# Patient Record
Sex: Male | Born: 1935 | Race: White | Hispanic: No | Marital: Married | Smoking: Former smoker
Health system: Southern US, Community
[De-identification: ages and names within clinical notes are randomized; demographics above are authoritative.]

## PROBLEM LIST (undated history)

## (undated) DIAGNOSIS — I428 Other cardiomyopathies: Secondary | ICD-10-CM

## (undated) DIAGNOSIS — I440 Atrioventricular block, first degree: Secondary | ICD-10-CM

## (undated) DIAGNOSIS — Z973 Presence of spectacles and contact lenses: Secondary | ICD-10-CM

## (undated) DIAGNOSIS — Z974 Presence of external hearing-aid: Secondary | ICD-10-CM

## (undated) DIAGNOSIS — E039 Hypothyroidism, unspecified: Secondary | ICD-10-CM

## (undated) DIAGNOSIS — R7303 Prediabetes: Secondary | ICD-10-CM

## (undated) DIAGNOSIS — I447 Left bundle-branch block, unspecified: Secondary | ICD-10-CM

## (undated) DIAGNOSIS — M1990 Unspecified osteoarthritis, unspecified site: Secondary | ICD-10-CM

## (undated) DIAGNOSIS — K644 Residual hemorrhoidal skin tags: Secondary | ICD-10-CM

## (undated) DIAGNOSIS — I2510 Atherosclerotic heart disease of native coronary artery without angina pectoris: Secondary | ICD-10-CM

## (undated) DIAGNOSIS — G621 Alcoholic polyneuropathy: Secondary | ICD-10-CM

## (undated) DIAGNOSIS — E538 Deficiency of other specified B group vitamins: Secondary | ICD-10-CM

## (undated) DIAGNOSIS — K449 Diaphragmatic hernia without obstruction or gangrene: Secondary | ICD-10-CM

## (undated) DIAGNOSIS — K5730 Diverticulosis of large intestine without perforation or abscess without bleeding: Secondary | ICD-10-CM

## (undated) DIAGNOSIS — Z86010 Personal history of colonic polyps: Secondary | ICD-10-CM

## (undated) DIAGNOSIS — N400 Enlarged prostate without lower urinary tract symptoms: Secondary | ICD-10-CM

## (undated) DIAGNOSIS — E291 Testicular hypofunction: Secondary | ICD-10-CM

## (undated) DIAGNOSIS — K219 Gastro-esophageal reflux disease without esophagitis: Secondary | ICD-10-CM

## (undated) DIAGNOSIS — Z8719 Personal history of other diseases of the digestive system: Secondary | ICD-10-CM

## (undated) DIAGNOSIS — N3010 Interstitial cystitis (chronic) without hematuria: Secondary | ICD-10-CM

## (undated) DIAGNOSIS — K5909 Other constipation: Secondary | ICD-10-CM

## (undated) DIAGNOSIS — Z789 Other specified health status: Secondary | ICD-10-CM

## (undated) DIAGNOSIS — Z860101 Personal history of adenomatous and serrated colon polyps: Secondary | ICD-10-CM

## (undated) DIAGNOSIS — K589 Irritable bowel syndrome without diarrhea: Secondary | ICD-10-CM

## (undated) HISTORY — DX: Testicular hypofunction: E29.1

## (undated) HISTORY — PX: TRANSTHORACIC ECHOCARDIOGRAM: SHX275

## (undated) HISTORY — PX: CATARACT EXTRACTION W/ INTRAOCULAR LENS  IMPLANT, BILATERAL: SHX1307

## (undated) HISTORY — DX: Other cardiomyopathies: I42.8

## (undated) HISTORY — DX: Residual hemorrhoidal skin tags: K64.4

## (undated) HISTORY — DX: Irritable bowel syndrome, unspecified: K58.9

## (undated) HISTORY — PX: CARDIOVASCULAR STRESS TEST: SHX262

## (undated) HISTORY — PX: TOTAL KNEE ARTHROPLASTY: SHX125

## (undated) HISTORY — PX: CARDIAC CATHETERIZATION: SHX172

## (undated) HISTORY — PX: TONSILLECTOMY AND ADENOIDECTOMY: SUR1326

## (undated) HISTORY — DX: Gastro-esophageal reflux disease without esophagitis: K21.9

## (undated) HISTORY — DX: Atherosclerotic heart disease of native coronary artery without angina pectoris: I25.10

## (undated) HISTORY — PX: COLONOSCOPY: SHX174

## (undated) HISTORY — DX: Diaphragmatic hernia without obstruction or gangrene: K44.9

## (undated) HISTORY — DX: Interstitial cystitis (chronic) without hematuria: N30.10

## (undated) HISTORY — PX: UPPER GASTROINTESTINAL ENDOSCOPY: SHX188

## (undated) HISTORY — PX: OTHER SURGICAL HISTORY: SHX169

## (undated) HISTORY — PX: TRANSURETHRAL RESECTION OF PROSTATE: SHX73

## (undated) HISTORY — DX: Left bundle-branch block, unspecified: I44.7

---
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
## 1998-06-29 ENCOUNTER — Other Ambulatory Visit: Admission: RE | Admit: 1998-06-29 | Discharge: 1998-06-29 | Payer: Self-pay | Admitting: Gastroenterology

## 2005-04-20 ENCOUNTER — Ambulatory Visit (HOSPITAL_BASED_OUTPATIENT_CLINIC_OR_DEPARTMENT_OTHER): Admission: RE | Admit: 2005-04-20 | Discharge: 2005-04-20 | Payer: Self-pay | Admitting: Urology

## 2005-04-20 ENCOUNTER — Encounter (INDEPENDENT_AMBULATORY_CARE_PROVIDER_SITE_OTHER): Payer: Self-pay | Admitting: Specialist

## 2005-04-20 ENCOUNTER — Ambulatory Visit (HOSPITAL_COMMUNITY): Admission: RE | Admit: 2005-04-20 | Discharge: 2005-04-20 | Payer: Self-pay | Admitting: Urology

## 2005-09-12 ENCOUNTER — Ambulatory Visit: Payer: Self-pay | Admitting: Gastroenterology

## 2005-09-19 ENCOUNTER — Ambulatory Visit: Payer: Self-pay | Admitting: Gastroenterology

## 2006-10-09 ENCOUNTER — Encounter: Admission: RE | Admit: 2006-10-09 | Discharge: 2006-10-09 | Payer: Self-pay | Admitting: Internal Medicine

## 2006-10-12 ENCOUNTER — Ambulatory Visit: Payer: Self-pay | Admitting: Gastroenterology

## 2006-11-03 ENCOUNTER — Ambulatory Visit: Payer: Self-pay | Admitting: Gastroenterology

## 2006-11-03 ENCOUNTER — Encounter (INDEPENDENT_AMBULATORY_CARE_PROVIDER_SITE_OTHER): Payer: Self-pay | Admitting: *Deleted

## 2008-04-01 ENCOUNTER — Encounter: Admission: RE | Admit: 2008-04-01 | Discharge: 2008-04-01 | Payer: Self-pay | Admitting: Diagnostic Radiology

## 2008-09-02 ENCOUNTER — Encounter: Payer: Self-pay | Admitting: Gastroenterology

## 2010-06-03 ENCOUNTER — Encounter: Admission: RE | Admit: 2010-06-03 | Discharge: 2010-06-03 | Payer: Self-pay | Admitting: Urology

## 2010-06-11 ENCOUNTER — Ambulatory Visit (HOSPITAL_BASED_OUTPATIENT_CLINIC_OR_DEPARTMENT_OTHER): Admission: RE | Admit: 2010-06-11 | Discharge: 2010-06-12 | Payer: Self-pay | Admitting: Urology

## 2010-06-12 ENCOUNTER — Emergency Department (HOSPITAL_COMMUNITY): Admission: EM | Admit: 2010-06-12 | Discharge: 2010-06-12 | Payer: Self-pay | Admitting: Emergency Medicine

## 2010-12-20 ENCOUNTER — Encounter: Payer: Self-pay | Admitting: Gastroenterology

## 2011-01-06 NOTE — Letter (Signed)
Summary: Colonoscopy-Changed to Office Visit Letter   Gastroenterology  [address]   [place], [place] [address]   Phone: [phone]  Fax: [phone]      December 20, 2010 MRN: [id number]   David Gutierrez [address] [place], [place]  [address]   Dear [name],   According to our records, it is time for you to schedule a Colonoscopy. However, after reviewing your medical record, I feel that an office visit would be most appropriate to more completely evaluate you and determine your need for a repeat procedure.  Please call [phone] (option #2) at your convenience to schedule an office visit. If you have any questions, concerns, or feel that this letter is in error, we would appreciate your call.   Sincerely,   Vania Rea. Jarold Motto, M.D.  [facility] Gastroenterology Division [phone]

## 2011-02-20 LAB — CBC
MCH: 32.8 pg (ref 26.0–34.0)
MCV: 94.9 fL (ref 78.0–100.0)
RDW: 17.1 % — ABNORMAL HIGH (ref 11.5–15.5)

## 2011-02-20 LAB — BASIC METABOLIC PANEL
BUN: 8 mg/dL (ref 6–23)
CO2: 25 mEq/L (ref 19–32)
Chloride: 102 mEq/L (ref 96–112)
Creatinine, Ser: 0.65 mg/dL (ref 0.4–1.5)
GFR calc Af Amer: >60 mL/min (ref >60)
Glucose, Bld: 115 mg/dL — ABNORMAL HIGH (ref 70–99)

## 2011-02-20 LAB — DIFFERENTIAL
Basophils Absolute: 0.4 10*3/uL — ABNORMAL HIGH (ref 0.0–0.1)
Eosinophils Absolute: 0.1 10*3/uL (ref 0.0–0.7)
Eosinophils Relative: 1 % (ref 0–5)
Lymphocytes Relative: 13 % (ref 12–46)
Neutrophils Relative %: 76 % (ref 43–77)

## 2011-02-20 LAB — POCT I-STAT 4, (NA,K, GLUC, HGB,HCT)
Hemoglobin: 13.3 g/dL (ref 13.0–17.0)
Potassium: 5.4 mEq/L — ABNORMAL HIGH (ref 3.5–5.1)
Sodium: 139 mEq/L (ref 135–145)

## 2011-02-23 ENCOUNTER — Encounter (INDEPENDENT_AMBULATORY_CARE_PROVIDER_SITE_OTHER): Payer: Self-pay | Admitting: *Deleted

## 2011-03-03 NOTE — Letter (Signed)
Summary: New Patient letter  [facility]enter[facility]  [address]   [place], [place] [address]   Phone: [phone]  Fax: [phone]       02/23/2011 MRN: [id number]  David Gutierrez [address] [place], [place]  [address]  Dear [name],  Welcome to the Gastroenterology Division at [facility].    You are scheduled to see Dr.  Jarold Motto on 04/01/2011 at 10:00 on the 3rd floor at [facility], [address]  We ask that you try to arrive at our office 15 minutes prior to your appointment time to allow for check-in.  We would like you to complete the enclosed self-administered evaluation form prior to your visit and bring it with you on the day of your appointment.  We will review it with you.  Also, please bring a complete list of all your medications or, if you prefer, bring the medication bottles and we will list them.  Please bring your insurance card so that we may make a copy of it.  If your insurance requires a referral to see a specialist, please bring your referral form from your primary care physician.  Co-payments are due at the time of your visit and may be paid by cash, check or credit card.     Your office visit will consist of a consult with your physician (includes a physical exam), any laboratory testing he/she may order, scheduling of any necessary diagnostic testing (e.g. x-ray, ultrasound, CT-scan), and scheduling of a procedure (e.g. Endoscopy, Colonoscopy) if required.  Please allow enough time on your schedule to allow for any/all of these possibilities.    If you cannot keep your appointment, please call [phone] to cancel or reschedule prior to your appointment date.  This allows [place] the opportunity to schedule an appointment for another patient in need of care.  If you do not cancel or reschedule by 5 p.m. the business day prior to your appointment date, you will be charged a $50.00 late cancellation/no-show fee.    Thank you for choosing  [facility] for your medical needs.  We appreciate the opportunity to care for you.  Please visit [place] at our website  to learn more about our practice.                     Sincerely,                                                             The Gastroenterology Division

## 2011-04-01 ENCOUNTER — Ambulatory Visit: Payer: Self-pay | Admitting: Gastroenterology

## 2011-04-19 ENCOUNTER — Encounter: Payer: Self-pay | Admitting: Gastroenterology

## 2011-04-19 ENCOUNTER — Ambulatory Visit (INDEPENDENT_AMBULATORY_CARE_PROVIDER_SITE_OTHER): Payer: Medicare Other | Admitting: Gastroenterology

## 2011-04-19 VITALS — BP 130/80 | HR 72 | Ht 72.0 in | Wt 219.2 lb

## 2011-04-19 DIAGNOSIS — Z86010 Personal history of colonic polyps: Secondary | ICD-10-CM

## 2011-04-19 MED ORDER — PEG-KCL-NACL-NASULF-NA ASC-C 100 G PO SOLR: 1.0000 | Freq: Once | ORAL | Status: AC

## 2011-04-19 NOTE — Patient Instructions (Signed)
Your procedure has been scheduled for 04/25/2011, please follow the seperate instructions.  Your prescription(s) have been sent to you pharmacy.

## 2011-04-19 NOTE — Progress Notes (Signed)
History of Present Illness:  This is a 77 year old Caucasian male in excellent health except for interstitial cystitis. He has a long history of recurrent colon polyps going back some 20 years. Family history is noncontributory. He has no cardiovascular, pulmonary, or other general medical problems. He denies bowel regularity, melena or hematochezia, dyspepsia, reflux symptoms, hernia tubular complaints. Had multiple labs today but were reviewed which showed normal CBC a metabolic profile. He does take a daily stool softener and a colon cleanser 3 tablets twice a day.   I have reviewed this patient's present history, medical and surgical past history, allergies and medications.     ROS: The remainder of the 10 point ROS is negative     Physical Exam: General well developed well nourished patient in no acute distress, appearing younger than his stated age. Cannot appreciate stigmata of chronic liver disease. Chest is clear cardiac exam is unremarkable. There is no hepatosplenomegaly, abdominal masses or tenderness. Neuro status is normal.     Assessment and plan: I reviewed this patient's records colonoscopy reports over the last 20 years. Has had recurrent numerous adenomatous colon polyps, and I do think repeat exam at this time is indicated. If his colonoscopy is negative, we will suspend colonoscopy checks pending on his future comorbid medical problems.  Please copy her primary care physician, referring physician, and pertinent subspecialists.  Encounter Diagnosis  Name Primary?  . Personal history of colonic polyps Yes

## 2011-04-22 NOTE — Assessment & Plan Note (Signed)
David Gutierrez                           GASTROENTEROLOGY OFFICE NOTE   NAME:David Gutierrez, David Gutierrez                        MRN:          [id number]  DATE:10/12/2006                            DOB:          1933/05/08    David Gutierrez has had years of acid reflux but has used vinegar as a treatment.  He  now describes 6 to 8 months of progressive solid food dysphagia.  He had an  upper GI series on October 09, 2006, which is interpreted by Dr. 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  He was able to swallow a 13 mm barium tablet without  difficulty.   The patient has not been on any treatment for his acid reflux.  He does have  a long history of recurrent colon polyps and has had frequent checks within  the last 2 years.  He denies lower GI problems at this time.  He has had no  anorexia or weight loss.  His only medical problem otherwise is interstitial  cystitis treated by Dr. Logan Bores.  He currently is on multiple medications for  such including Elmiron 100 mg twice a day, Elavil 25 mg at bedtimes, Atarax  25 mg at bedtime, Sanctura 20 mg twice a day, and Zoloft 50 mg a day with  p.r.n. Advil use.   REVIEW OF SYSTEMS:  Negative for any cardiovascular or pulmonary complaints,  or any symptoms of collagen vascular disease, or Raynaud phenomenon.  He is  followed closely by Dr. Jarome Matin.   EXAM:  He is 6 feet 1 inches tall and weighed 225 pounds.  Blood pressure  120/78, pulse was 74 and regular.  General physical exam is not performed at this time.   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  There is certainly no reason to suspect esophageal carcinoma  although this is a possibility.   RECOMMENDATIONS:  1. Outpatient endoscopy with biopsy and possible dilatation.  2.  Standard antireflux maneuvers, along with Nexium 40 mg q. a.m. to twice      a day as needed.  3. Other medications per Drs. Jarome Matin and Logan Bores.     Vania Rea. Jarold Motto, MD, Caleen Essex, FAGA  Electronically Signed    DRP/MedQ  DD: 10/12/2006  DT: 10/12/2006  Job #: [id number]   cc:   Barry Dienes. Eloise Harman, M.D.  Jamison Neighbor, M.D.

## 2011-04-22 NOTE — Op Note (Signed)
NAME:  RIYAD, KEENA                 ACCOUNT NO.:  [id number]   MEDICAL RECORD NO.:  [id number]          PATIENT TYPE:  AMB   LOCATION:  [facility]                         FACILITY:  [facility]   PHYSICIAN:  Ronald L. Earlene Plater, M.D.  DATE OF BIRTH:  11-18-1933   DATE OF PROCEDURE:  04/20/2005  DATE OF DISCHARGE:                                 OPERATIVE REPORT   DIAGNOSIS:  Probable interstitial cystitis.   OPERATIVE PROCEDURE:  Cystourethroscopy, hydraulic bladder distention,  bladder biopsy.   SURGEON:  Lucrezia Starch. Earlene Plater, M.D.   ANESTHESIA:  LMA.   ESTIMATED BLOOD LOSS:  Negligible.   TUBES:  None.   COMPLICATIONS:  None.   INDICATIONS FOR PROCEDURE:  Mr. Romberger is a very nice, 77 year old white  male, who Dr. Vonita Moss has followed for a number of years, who has a complex  history.  Essentially, he has years of urgency, frequency, dysuria,  hesitancy, originally diagnosed with interstitial cystitis by Dr. _________  in 1984.  He has undergone multiple therapies including Elmiron, DDAVP,  DMSO, Elavil, Oxytrol, and Prelief, had urodynamics in [place] in 2005, and  had a severe urinary tract infection after that.  He is currently on Proscar  and UroXatral and urinates all night and has continued symptoms.  After  understanding the risks, benefits, and alternatives, he elected to proceed  with the above procedure.   PROCEDURE IN DETAIL:  The patient was placed in the supine position, after  proper LMA anesthesia was placed in the dorsal lithotomy position and  prepped and draped with Betadine in a sterile fashion.  Cystourethroscopy  was performed with a 22.5-French Olympus panendoscope.  Utilizing the 12-  and 70-degree lenses, the bladder was carefully inspected.  He was noted to  have significant trilobar hypertrophy with grade 1 trabeculation.  Efflux of  clear urine was noted from the normally-placed ureteral orifices  bilaterally.  There was a median bar noted.  Hydraulic bladder  distention  was then performed to 80 cm of water pressure.  He was noted to have a  capacity of 1000 cc, and on draining it, he did have some posterior wall  glomerulations but no frank Hunner's ulcers.  A biopsy was taken from the  posterior midline and submitted to pathology, and the base was cauterized  with the Bugbee coagulation cautery. The bladder was drained.  The  panendoscope was removed.  The patient was taken to the recovery room  stable.    RLD/MEDQ  D:  04/20/2005  T:  04/20/2005  Job:  [id number]

## 2011-04-25 ENCOUNTER — Encounter: Payer: Self-pay | Admitting: Gastroenterology

## 2011-04-25 ENCOUNTER — Other Ambulatory Visit: Payer: Medicare Other | Admitting: Gastroenterology

## 2011-05-11 ENCOUNTER — Encounter: Payer: Self-pay | Admitting: Gastroenterology

## 2011-05-11 ENCOUNTER — Ambulatory Visit (AMBULATORY_SURGERY_CENTER): Payer: Medicare Other | Admitting: Gastroenterology

## 2011-05-11 VITALS — BP 130/75 | HR 66 | Temp 96.2°F | Resp 18 | Ht 72.0 in | Wt 219.0 lb

## 2011-05-11 DIAGNOSIS — K639 Disease of intestine, unspecified: Secondary | ICD-10-CM

## 2011-05-11 DIAGNOSIS — D126 Benign neoplasm of colon, unspecified: Secondary | ICD-10-CM

## 2011-05-11 DIAGNOSIS — Z86010 Personal history of colonic polyps: Secondary | ICD-10-CM

## 2011-05-11 DIAGNOSIS — K6389 Other specified diseases of intestine: Secondary | ICD-10-CM

## 2011-05-11 MED ORDER — SODIUM CHLORIDE 0.9 % IV SOLN: 500.0000 mL | INTRAVENOUS | Status: DC

## 2011-05-11 NOTE — Patient Instructions (Signed)
Discharge instructions given with verbal understanding. Handouts on hemorrhoids given. Resume previous medications.

## 2011-05-12 ENCOUNTER — Telehealth: Payer: Self-pay

## 2011-05-12 NOTE — Telephone Encounter (Signed)

## 2011-05-23 ENCOUNTER — Encounter: Payer: Self-pay | Admitting: Gastroenterology

## 2011-10-13 ENCOUNTER — Other Ambulatory Visit: Payer: Self-pay | Admitting: Dermatology

## 2012-07-09 ENCOUNTER — Other Ambulatory Visit: Payer: Self-pay | Admitting: Dermatology

## 2012-10-05 ENCOUNTER — Encounter: Payer: Self-pay | Admitting: *Deleted

## 2012-10-08 ENCOUNTER — Encounter: Payer: Self-pay | Admitting: Cardiology

## 2012-10-08 ENCOUNTER — Ambulatory Visit (INDEPENDENT_AMBULATORY_CARE_PROVIDER_SITE_OTHER): Payer: Medicare Other | Admitting: Cardiology

## 2012-10-08 VITALS — BP 122/74 | HR 72 | Ht 72.0 in | Wt 215.0 lb

## 2012-10-08 DIAGNOSIS — R079 Chest pain, unspecified: Secondary | ICD-10-CM

## 2012-10-08 DIAGNOSIS — I447 Left bundle-branch block, unspecified: Secondary | ICD-10-CM

## 2012-10-08 NOTE — Patient Instructions (Addendum)
Take aspirin 81mg  daily.   Your physician has requested that you have an adenosine myoview. For further information please visit [email or web address] Please follow instruction sheet, as given.  Your physician recommends that you schedule a follow-up appointment as needed with Dr Shirlee Latch.

## 2012-10-09 DIAGNOSIS — R079 Chest pain, unspecified: Secondary | ICD-10-CM | POA: Insufficient documentation

## 2012-10-09 DIAGNOSIS — I447 Left bundle-branch block, unspecified: Secondary | ICD-10-CM | POA: Insufficient documentation

## 2012-10-09 NOTE — Progress Notes (Signed)
Patient ID: David Gutierrez, male   DOB: 07/26/1933, 78 y.o.   MRN: [id number] PCP: Dr. Paterson  78 yo with minimal past medical history presents for cardiology evaluation.  He presents today because of an episode of left-sided chest pain lasting on and off for about a week that he had recently.  It felt like muscle soreness but he could remember no activity that would have caused a muscle strain.  The pain waxed/waned over the week.  Pain seemed to be worse when he would more his right arm.  The chest pain has been gone completely now for about a week.   Mr Nazaryan has good exercise tolerance in general.  He is able to do heavy yardwork, walk dog, play golf with no problems.  No exertional chest pain or dyspnea.  His ECG today shows a LBBB.  I am unsure if this is old or new.  He has been told that he has an "abnormality" on his ECG in the past.  No ECGs in EPIC other than today's.    ECG: NSR, LBBB  PMH: 1. Low testosterone 2. Interstitial cystitis 3. LBBB  SH: Lives in [place], retired, quit smoking 20+ years ago.   FH: Brother with CHF, died at 68  ROS: All systems reviewed and negative except as per HPI.   Current Outpatient Prescriptions  Medication Sig Dispense Refill  . bethanechol (URECHOLINE) 25 MG tablet Take 1 tablet by mouth as directed.      . aspirin EC 81 MG tablet Take 1 tablet (81 mg total) by mouth daily.      . docusate sodium (COLACE) 100 MG capsule Take 100 mg by mouth at bedtime.        . Misc Natural Products (COLON CLEANSER PO) Take 3 capsules by mouth 2 (two) times daily.        . Testosterone (AXIRON) 30 MG/ACT SOLN Place onto the skin. Once daily        Current Facility-Administered Medications  Medication Dose Route Frequency Provider Last Rate Last Dose  . 0.9 %  sodium chloride infusion  500 mL Intravenous Continuous David R Patterson, MD        BP 122/74  Pulse 72  Ht 6' (1.829 m)  Wt 215 lb (97.523 kg)  BMI 29.16 kg/m2 General: NAD Neck: No JVD,  no thyromegaly or thyroid nodule.  Lungs: Clear to auscultation bilaterally with normal respiratory effort. CV: Nondisplaced PMI.  Heart regular S1/S2, paradoxical S2 split, no S3/S4, no murmur.  No peripheral edema.  No carotid bruit.  Normal pedal pulses.  Abdomen: Soft, nontender, no hepatosplenomegaly, no distention.  Skin: Intact without lesions or rashes.  Neurologic: Alert and oriented x 3.  Psych: Normal affect. Extremities: No clubbing or cyanosis.  HEENT: Normal.   Assessment/Plan: 1. Chest pain: Atypical.  He does not have many traditional risk factors other than gender and age.  However, he does have a LBBB (of uncertain duration).   - I asked him to start ASA 81 mg daily - Adenosine myoview for risk stratification and evaluation of chest pain espisode (will use adenosine given LBBB).  2. LBBB: ? Old or new.  May simply represent conduction system degeneration over time.  However, cannot rule it out as a marker for ischemia.  I will try to get old ECGs from PCP.   Cordella Nyquist 10/09/2012   

## 2012-10-16 ENCOUNTER — Ambulatory Visit (HOSPITAL_COMMUNITY): Payer: Medicare Other | Attending: Cardiology | Admitting: Radiology

## 2012-10-16 VITALS — BP 135/86 | Ht 72.0 in | Wt 212.0 lb

## 2012-10-16 DIAGNOSIS — R002 Palpitations: Secondary | ICD-10-CM | POA: Insufficient documentation

## 2012-10-16 DIAGNOSIS — R079 Chest pain, unspecified: Secondary | ICD-10-CM | POA: Insufficient documentation

## 2012-10-16 DIAGNOSIS — I447 Left bundle-branch block, unspecified: Secondary | ICD-10-CM | POA: Insufficient documentation

## 2012-10-16 DIAGNOSIS — R9431 Abnormal electrocardiogram [ECG] [EKG]: Secondary | ICD-10-CM | POA: Insufficient documentation

## 2012-10-16 DIAGNOSIS — Z87891 Personal history of nicotine dependence: Secondary | ICD-10-CM | POA: Insufficient documentation

## 2012-10-16 MED ORDER — TECHNETIUM TC 99M SESTAMIBI GENERIC - CARDIOLITE
30.0000 | Freq: Once | INTRAVENOUS | Status: AC | PRN
  Administered 2012-10-16: 30 via INTRAVENOUS

## 2012-10-16 MED ORDER — TECHNETIUM TC 99M SESTAMIBI GENERIC - CARDIOLITE
10.0000 | Freq: Once | INTRAVENOUS | Status: AC | PRN
  Administered 2012-10-16: 10 via INTRAVENOUS

## 2012-10-16 MED ORDER — ADENOSINE (DIAGNOSTIC) 3 MG/ML IV SOLN
0.5600 mg/kg | Freq: Once | INTRAVENOUS | Status: AC
  Administered 2012-10-16: 54 mg via INTRAVENOUS

## 2012-10-16 NOTE — Progress Notes (Addendum)
[facility] SITE 3 NUCLEAR MED [id number] [place] [address] [phone]  Cardiology Nuclear Med Study  David Gutierrez is a 78 y.o. male     MRN : [id number]     DOB: 26-Apr-1933  Procedure Date: 10/16/2012  Nuclear Med Background Indication for Stress Test:  Evaluation for Ischemia and Abnormal EKG History:  n/a Cardiac Risk Factors: History of Smoking and LBBB  Symptoms:  Chest Pain, DOE and Palpitations   Nuclear Pre-Procedure Caffeine/Decaff Intake:  None > 12 hrs NPO After: 7:00pm   Lungs:  clear O2 Sat: 95% on room air. IV 0.9% NS with Angio Cath:  20g  IV Site: R Antecubital x 1, tolerated well IV Started by:  Irean Hong, RN  Chest Size (in):  46 Cup Size: n/a  Height: 6' (1.829 m)  Weight:  212 lb (96.163 kg)  BMI:  Body mass index is 28.75 kg/(m^2). Tech Comments:  n/a    Nuclear Med Study 1 or 2 day study: 1 day  Stress Test Type:  Adenosine  Reading MD: Willa Rough, MD  Order Authorizing Provider:  Marca Ancona, MD  Resting Radionuclide: Technetium 32m Sestamibi  Resting Radionuclide Dose: 11.0 mCi   Stress Radionuclide:  Technetium 89m Sestamibi  Stress Radionuclide Dose: 33.0 mCi           Stress Protocol Rest HR: 66 Stress HR: 80  Rest BP: 135/86 Stress BP: 141/81  Exercise Time (min): n/a METS: n/a   Predicted Max HR: 143 bpm % Max HR: 55.94 bpm Rate Pressure Product: 62952   Dose of Adenosine (mg):  54.0 Dose of Lexiscan: n/a mg  Dose of Atropine (mg): n/a Dose of Dobutamine: n/a mcg/kg/min (at max HR)  Stress Test Technologist: Milana Na, EMT-P  Nuclear Technologist:  Doyne Keel, CNMT     Rest Procedure:  Myocardial perfusion imaging was performed at rest 45 minutes following the intravenous administration of Technetium 44m Sestamibi. Rest ECG: NSR - Normal EKG  Stress Procedure:  The patient received IV adenosine at 140 mcg/kg/min for 4 minutes.  There were non Dx changes 2nd to LBBB, + sob,  flushed, and occ pvcs/pacs with infusion.  Technetium 4m Sestamibi was injected at the 2 minute mark and quantitative spect images were obtained after a 45 minute delay. Stress ECG: Uninteretable due to baseline LBBB  QPS Raw Data Images:  Patient motion noted; appropriate software correction applied. Stress Images:  The stress images reveal a moderate area of decreased uptake affecting the inferior septum and the inferior wall at the base, mid ventricle, and apex. The degree of photon reduction is mild to moderate. Rest Images:  The rest images revealed decreased activity in the inferior septum and the inferior wall at the base, mid ventricle, and apex. This is similar to the stress image.  Subtraction (SDS):  There is no reversibility Transient Ischemic Dilatation (Normal <1.22):  1.02 Lung/Heart Ratio (Normal <0.45):  0.26  Quantitative Gated Spect Images QGS EDV:  169 ml QGS ESV:  105 ml  Impression Exercise Capacity:  Adenosine study with no exercise. BP Response:  Normal blood pressure response. Clinical Symptoms:  shortness of breath ECG Impression:  No significant ST segment change suggestive of ischemia. Comparison with Prior Nuclear Study: No images to compare  Overall Impression:  There is decreased activity in areas affecting the inferior septum and the inferior wall and the apex. There is no definite ischemia. The question is whether this abnormality is due to  the left bundle branch block or due to scar. Wall motion assessment suggests significant left ventricular dysfunction. At this point it must be assumed that the abnormality represents possible scar in the inferior wall and apex.  LV Ejection Fraction: 38%.  LV Wall Motion:   There is decreased motion of the septum. There is also decreased motion of the entire apex.  Willa Rough, MD  EF is low at 38%.  There is a fixed inferior and inferoseptal defect without ischemia.  Question here is whether this represents  infarction or possibly a LBBB cardiomyopathy.   Marca Ancona 10/17/2012

## 2012-10-19 MED ORDER — METOPROLOL TARTRATE 50 MG PO TABS: ORAL_TABLET | ORAL | Status: DC

## 2012-10-19 NOTE — Addendum Note (Signed)
Addended by: Jacqlyn Krauss on: 10/19/2012 10:33 AM   Modules accepted: Orders

## 2012-10-19 NOTE — Progress Notes (Signed)
Laurey Morale, MD      Sent: Thu October 18, 2012  7:42 AM         David Gutierrez    MRN: [id number] DOB: 1933/07/18     Pt Home: [phone]               Message     David Gutierrez needs an echocardiogram to assess cardiomyopathy (EF 38% on myoview but in setting of LBBB).  He also needs a coronary CT angiogram for equivocal myoview.  Would make sure to do this on a day that I am in the hospital next week or tomorrow.  He will need metoprolol 50 mg po x 1 to be taken 2 hours before the CT.     Thanks.    10/19/12 Pt advised,verbalized understanding.

## 2012-10-19 NOTE — Progress Notes (Signed)
Pt advised,verbalized understanding. 

## 2012-10-22 ENCOUNTER — Other Ambulatory Visit: Payer: Self-pay | Admitting: *Deleted

## 2012-10-22 ENCOUNTER — Other Ambulatory Visit (HOSPITAL_COMMUNITY): Payer: Self-pay | Admitting: Cardiology

## 2012-10-22 DIAGNOSIS — R079 Chest pain, unspecified: Secondary | ICD-10-CM

## 2012-10-25 ENCOUNTER — Encounter: Payer: Self-pay | Admitting: *Deleted

## 2012-10-25 ENCOUNTER — Ambulatory Visit (HOSPITAL_COMMUNITY): Payer: Medicare Other | Attending: Internal Medicine | Admitting: Radiology

## 2012-10-25 ENCOUNTER — Other Ambulatory Visit (INDEPENDENT_AMBULATORY_CARE_PROVIDER_SITE_OTHER): Payer: Medicare Other

## 2012-10-25 DIAGNOSIS — I059 Rheumatic mitral valve disease, unspecified: Secondary | ICD-10-CM | POA: Insufficient documentation

## 2012-10-25 DIAGNOSIS — R072 Precordial pain: Secondary | ICD-10-CM | POA: Insufficient documentation

## 2012-10-25 DIAGNOSIS — I359 Nonrheumatic aortic valve disorder, unspecified: Secondary | ICD-10-CM | POA: Insufficient documentation

## 2012-10-25 DIAGNOSIS — R079 Chest pain, unspecified: Secondary | ICD-10-CM

## 2012-10-25 DIAGNOSIS — I447 Left bundle-branch block, unspecified: Secondary | ICD-10-CM | POA: Insufficient documentation

## 2012-10-25 LAB — BASIC METABOLIC PANEL
CO2: 29 mEq/L (ref 19–32)
Creatinine, Ser: 0.8 mg/dL (ref 0.4–1.5)
Sodium: 136 mEq/L (ref 135–145)

## 2012-10-25 NOTE — Progress Notes (Signed)
Echocardiogram performed.  

## 2012-10-26 ENCOUNTER — Telehealth: Payer: Self-pay | Admitting: *Deleted

## 2012-10-26 ENCOUNTER — Ambulatory Visit (HOSPITAL_COMMUNITY)
Admission: RE | Admit: 2012-10-26 | Discharge: 2012-10-26 | Disposition: A | Discharge status: Home / Self Care | Payer: Medicare Other | Source: Ambulatory Visit | Attending: Cardiology | Admitting: Cardiology

## 2012-10-26 DIAGNOSIS — R079 Chest pain, unspecified: Secondary | ICD-10-CM

## 2012-10-26 DIAGNOSIS — R9430 Abnormal result of cardiovascular function study, unspecified: Secondary | ICD-10-CM

## 2012-10-26 DIAGNOSIS — I429 Cardiomyopathy, unspecified: Secondary | ICD-10-CM

## 2012-10-26 MED ORDER — METOPROLOL TARTRATE 1 MG/ML IV SOLN
5.0000 mg | Freq: Once | INTRAVENOUS | Status: AC
  Administered 2012-10-26: 5 mg via INTRAVENOUS
  Filled 2012-10-26: qty 5

## 2012-10-26 MED ORDER — IOHEXOL 350 MG/ML SOLN
80.0000 mL | Freq: Once | INTRAVENOUS | Status: AC | PRN
  Administered 2012-10-26: 80 mL via INTRAVENOUS

## 2012-10-26 MED ORDER — METOPROLOL TARTRATE 1 MG/ML IV SOLN
INTRAVENOUS | Status: AC
  Administered 2012-10-26: 5 mg via INTRAVENOUS
  Filled 2012-10-26: qty 10

## 2012-10-26 MED ORDER — MIDAZOLAM HCL 2 MG/2ML IJ SOLN
INTRAMUSCULAR | Status: AC
  Filled 2012-10-26: qty 4

## 2012-10-26 MED ORDER — ATORVASTATIN CALCIUM 20 MG PO TABS: 20.0000 mg | ORAL_TABLET | Freq: Every day | ORAL | Status: DC

## 2012-10-26 MED ORDER — NITROGLYCERIN 0.4 MG SL SUBL
SUBLINGUAL_TABLET | SUBLINGUAL | Status: AC
  Administered 2012-10-26: 0.4 mg
  Filled 2012-10-26: qty 25

## 2012-10-26 MED ORDER — CARVEDILOL 3.125 MG PO TABS: 3.1250 mg | ORAL_TABLET | Freq: Two times a day (BID) | ORAL | Status: DC

## 2012-10-26 MED ORDER — LISINOPRIL 5 MG PO TABS: 5.0000 mg | ORAL_TABLET | Freq: Every day | ORAL | Status: DC

## 2012-10-26 MED ORDER — METOPROLOL TARTRATE 1 MG/ML IV SOLN
5.0000 mg | INTRAVENOUS | Status: DC | PRN
Start: 2012-10-26 — End: 2012-10-27
  Administered 2012-10-26 (×4): 5 mg via INTRAVENOUS
  Filled 2012-10-26: qty 5

## 2012-10-26 MED ORDER — METOPROLOL TARTRATE 1 MG/ML IV SOLN
INTRAVENOUS | Status: AC
  Administered 2012-10-26: 5 mg via INTRAVENOUS
  Filled 2012-10-26: qty 15

## 2012-10-26 NOTE — Telephone Encounter (Signed)
Message copied by Freddi Starr on Fri Oct 26, 2012  4:17 PM ------      Message from: Laurey Morale      Created: Fri Oct 26, 2012  4:01 PM       I saw [name] today when getting his cardiac CT.  Has coronary disease and low EF.  Will plan on arranging cath after Thanksgiving, Thurston Hole can do this. I do want him to go ahead and start 3 medications: Atorvastatin 20 mg daily, Coreg 3.125 mg bid, and lisinopril 5 mg daily.  He will need BMET and BNP in about 10 days.       Thanks.

## 2012-11-02 ENCOUNTER — Telehealth: Payer: Self-pay | Admitting: Cardiology

## 2012-11-02 DIAGNOSIS — R9430 Abnormal result of cardiovascular function study, unspecified: Secondary | ICD-10-CM

## 2012-11-02 NOTE — Telephone Encounter (Signed)
Pt was wondering when his cath gonna be scheduled

## 2012-11-02 NOTE — Telephone Encounter (Signed)
Pt is inquiring about having his cath set up.

## 2012-11-05 ENCOUNTER — Encounter: Payer: Self-pay | Admitting: *Deleted

## 2012-11-05 ENCOUNTER — Other Ambulatory Visit (INDEPENDENT_AMBULATORY_CARE_PROVIDER_SITE_OTHER): Payer: Medicare Other

## 2012-11-05 DIAGNOSIS — R9430 Abnormal result of cardiovascular function study, unspecified: Secondary | ICD-10-CM

## 2012-11-05 DIAGNOSIS — I428 Other cardiomyopathies: Secondary | ICD-10-CM

## 2012-11-05 DIAGNOSIS — I2510 Atherosclerotic heart disease of native coronary artery without angina pectoris: Secondary | ICD-10-CM

## 2012-11-05 DIAGNOSIS — I429 Cardiomyopathy, unspecified: Secondary | ICD-10-CM

## 2012-11-05 DIAGNOSIS — R0989 Other specified symptoms and signs involving the circulatory and respiratory systems: Secondary | ICD-10-CM

## 2012-11-05 LAB — CBC WITH DIFFERENTIAL/PLATELET
Basophils Absolute: 0 10*3/uL (ref 0.0–0.1)
Eosinophils Absolute: 0.2 10*3/uL (ref 0.0–0.7)
Hemoglobin: 12.8 g/dL — ABNORMAL LOW (ref 13.0–17.0)
Lymphocytes Relative: 25.4 % (ref 12.0–46.0)
MCHC: 33 g/dL (ref 30.0–36.0)
Monocytes Relative: 7.1 % (ref 3.0–12.0)
Neutro Abs: 3.9 10*3/uL (ref 1.4–7.7)
Neutrophils Relative %: 63.4 % (ref 43.0–77.0)
RDW: 17.4 % — ABNORMAL HIGH (ref 11.5–14.6)

## 2012-11-05 LAB — PROTIME-INR: INR: 1.1 ratio — ABNORMAL HIGH (ref 0.8–1.0)

## 2012-11-05 LAB — BASIC METABOLIC PANEL
BUN: 11 mg/dL (ref 6–23)
Calcium: 8.8 mg/dL (ref 8.4–10.5)
GFR: 120.07 mL/min (ref >60.00)
Potassium: 4.3 mEq/L (ref 3.5–5.1)
Sodium: 138 mEq/L (ref 135–145)

## 2012-11-05 LAB — BRAIN NATRIURETIC PEPTIDE: Pro B Natriuretic peptide (BNP): 37 pg/mL (ref 0.0–100.0)

## 2012-11-05 NOTE — Telephone Encounter (Signed)
LMTCB

## 2012-11-05 NOTE — Telephone Encounter (Signed)
LCH radial scheduled for 11/07/12 1 PM

## 2012-11-05 NOTE — Telephone Encounter (Signed)
Pt advised,verbalized understanding. 

## 2012-11-06 ENCOUNTER — Other Ambulatory Visit: Payer: Self-pay | Admitting: Cardiology

## 2012-11-06 DIAGNOSIS — I429 Cardiomyopathy, unspecified: Secondary | ICD-10-CM

## 2012-11-07 ENCOUNTER — Encounter (HOSPITAL_BASED_OUTPATIENT_CLINIC_OR_DEPARTMENT_OTHER)
Admission: RE | Disposition: A | Discharge status: Home / Self Care | Payer: Self-pay | Source: Ambulatory Visit | Attending: Cardiology

## 2012-11-07 ENCOUNTER — Inpatient Hospital Stay (HOSPITAL_BASED_OUTPATIENT_CLINIC_OR_DEPARTMENT_OTHER)
Admission: RE | Admit: 2012-11-07 | Discharge: 2012-11-07 | Disposition: A | Discharge status: Home / Self Care | Payer: Medicare Other | Source: Ambulatory Visit | Attending: Cardiology | Admitting: Cardiology

## 2012-11-07 DIAGNOSIS — I429 Cardiomyopathy, unspecified: Secondary | ICD-10-CM

## 2012-11-07 DIAGNOSIS — I428 Other cardiomyopathies: Secondary | ICD-10-CM | POA: Insufficient documentation

## 2012-11-07 DIAGNOSIS — R9439 Abnormal result of other cardiovascular function study: Secondary | ICD-10-CM | POA: Insufficient documentation

## 2012-11-07 DIAGNOSIS — R0789 Other chest pain: Secondary | ICD-10-CM | POA: Insufficient documentation

## 2012-11-07 DIAGNOSIS — I2510 Atherosclerotic heart disease of native coronary artery without angina pectoris: Secondary | ICD-10-CM

## 2012-11-07 SURGERY — JV LEFT HEART CATHETERIZATION WITH CORONARY ANGIOGRAM
Anesthesia: Moderate Sedation

## 2012-11-07 MED ORDER — ACETAMINOPHEN 325 MG PO TABS: 650.0000 mg | ORAL_TABLET | ORAL | Status: DC | PRN

## 2012-11-07 MED ORDER — ASPIRIN 81 MG PO CHEW
324.0000 mg | CHEWABLE_TABLET | ORAL | Status: AC
  Administered 2012-11-07: 324 mg via ORAL

## 2012-11-07 MED ORDER — ONDANSETRON HCL 4 MG/2ML IJ SOLN: 4.0000 mg | Freq: Four times a day (QID) | INTRAMUSCULAR | Status: DC | PRN

## 2012-11-07 MED ORDER — SODIUM CHLORIDE 0.9 % IV SOLN
INTRAVENOUS | Status: DC
  Administered 2012-11-07: 12:00:00 via INTRAVENOUS

## 2012-11-07 MED ORDER — SODIUM CHLORIDE 0.9 % IV SOLN: 250.0000 mL | INTRAVENOUS | Status: DC | PRN

## 2012-11-07 MED ORDER — SODIUM CHLORIDE 0.9 % IV SOLN: INTRAVENOUS | Status: DC

## 2012-11-07 MED ORDER — SODIUM CHLORIDE 0.9 % IJ SOLN: 3.0000 mL | INTRAMUSCULAR | Status: DC | PRN

## 2012-11-07 MED ORDER — SODIUM CHLORIDE 0.9 % IJ SOLN: 3.0000 mL | Freq: Two times a day (BID) | INTRAMUSCULAR | Status: DC

## 2012-11-07 NOTE — Progress Notes (Signed)
TR band removed, gauze dressing applied to right radial site, which is level 0.  Right wrist immobilizer applied to right wrist.

## 2012-11-07 NOTE — H&P (View-Only) (Signed)
Patient ID: David Gutierrez, male   DOB: 04-Jun-1933, 78 y.o.   MRN: [id number] PCP: Dr. Eloise Harman  78 yo with minimal past medical history presents for cardiology evaluation.  He presents today because of an episode of left-sided chest pain lasting on and off for about a week that he had recently.  It felt like muscle soreness but he could remember no activity that would have caused a muscle strain.  The pain waxed/waned over the week.  Pain seemed to be worse when he would more his right arm.  The chest pain has been gone completely now for about a week.   [name] has good exercise tolerance in general.  He is able to do heavy yardwork, walk dog, play golf with no problems.  No exertional chest pain or dyspnea.  His ECG today shows a LBBB.  I am unsure if this is old or new.  He has been told that he has an "abnormality" on his ECG in the past.  No ECGs in EPIC other than today's.    ECG: NSR, LBBB  PMH: 1. Low testosterone 2. Interstitial cystitis 3. LBBB  SH: Lives in [place], retired, quit smoking 20+ years ago.   FH: Brother with CHF, died at 2  ROS: All systems reviewed and negative except as per HPI.   Current Outpatient Prescriptions  Medication Sig Dispense Refill  . bethanechol (URECHOLINE) 25 MG tablet Take 1 tablet by mouth as directed.      Marland Kitchen aspirin EC 81 MG tablet Take 1 tablet (81 mg total) by mouth daily.      Marland Kitchen docusate sodium (COLACE) 100 MG capsule Take 100 mg by mouth at bedtime.        . Misc Natural Products (COLON CLEANSER PO) Take 3 capsules by mouth 2 (two) times daily.        . Testosterone (AXIRON) 30 MG/ACT SOLN Place onto the skin. Once daily        Current Facility-Administered Medications  Medication Dose Route Frequency Provider Last Rate Last Dose  . 0.9 %  sodium chloride infusion  500 mL Intravenous Continuous Mardella Layman, MD        BP 122/74  Pulse 72  Ht 6' (1.829 m)  Wt 215 lb (97.523 kg)  BMI 29.16 kg/m2 General: NAD Neck: No JVD,  no thyromegaly or thyroid nodule.  Lungs: Clear to auscultation bilaterally with normal respiratory effort. CV: Nondisplaced PMI.  Heart regular S1/S2, paradoxical S2 split, no S3/S4, no murmur.  No peripheral edema.  No carotid bruit.  Normal pedal pulses.  Abdomen: Soft, nontender, no hepatosplenomegaly, no distention.  Skin: Intact without lesions or rashes.  Neurologic: Alert and oriented x 3.  Psych: Normal affect. Extremities: No clubbing or cyanosis.  HEENT: Normal.   Assessment/Plan: 1. Chest pain: Atypical.  He does not have many traditional risk factors other than gender and age.  However, he does have a LBBB (of uncertain duration).   - I asked him to start ASA 81 mg daily - Adenosine myoview for risk stratification and evaluation of chest pain espisode (will use adenosine given LBBB).  2. LBBB: ? Old or new.  May simply represent conduction system degeneration over time.  However, cannot rule it out as a marker for ischemia.  I will try to get old ECGs from PCP.   Marca Ancona 10/09/2012

## 2012-11-07 NOTE — Progress Notes (Signed)
Positive Allen's test performed on right hand, spo2 97%.

## 2012-11-07 NOTE — Interval H&P Note (Signed)
History and Physical Interval Note:  11/07/2012 1:11 PM  David Gutierrez  has presented today for surgery, with the diagnosis of Abnormal CT  The various methods of treatment have been discussed with the patient and family. After consideration of risks, benefits and other options for treatment, the patient has consented to  Procedure(s) (LRB) with comments: JV LEFT HEART CATHETERIZATION WITH CORONARY ANGIOGRAM (N/A) as a surgical intervention .  The patient's history has been reviewed, patient examined, no change in status, stable for surgery.  I have reviewed the patient's chart and labs.  Questions were answered to the patient's satisfaction.     Toni Hoffmeister [employer]

## 2012-11-07 NOTE — CV Procedure (Signed)
   Cardiac Catheterization Procedure Note  Name: [place] [facility] MRN: [id number] DOB: 1933/11/19  Procedure: Left Heart Cath, Selective Coronary Angiography, LV angiography  Indication: Low EF, abnormal CT angiogram.    Procedural Details: The right wrist was prepped, draped, and anesthetized with 1% lidocaine. Using the modified Seldinger technique, a 5 French sheath was introduced into the right radial artery. 3 mg of verapamil was administered through the sheath, weight-based unfractionated heparin was administered intravenously. Standard Judkins catheters were used for selective coronary angiography and left ventriculography. Catheter exchanges were performed over an exchange length guidewire. There were no immediate procedural complications. A TR band was used for radial hemostasis at the completion of the procedure.  The patient was transferred to the post catheterization recovery area for further monitoring.  Procedural Findings: Hemodynamics: AO 107/65 LV 105/8  Coronary angiography: Coronary dominance: right  Left mainstem: No significant disease.   Left anterior descending (LAD): 30% proximal LAD stenosis.  40% ostial D1 stenosis.   Left circumflex (LCx): Moderate ramus with luminal irregularities.  30% ostial LCx.  Diffuse disease up to 40-50% in the distal LCx.   Right coronary artery (RCA): 40% mid RCA stenosis.    Left ventriculography: Left ventricular systolic function is probably mildly decreased, LVEF is estimated at 50% but difficult due to PVCs and short run VT with injection.   Final Conclusions:  Nonobstructive CAD.  I suspect that [place] cardiomyopathy is most likely a LBBB cardiomyopathy.  I have started him on Coreg and lisinopril.  He should continue ASA and atorvastatin due to significant, though nonobstructive, CAD.   Marca Ancona 11/07/2012, 1:55 PM

## 2012-11-20 ENCOUNTER — Encounter: Payer: Self-pay | Admitting: Physician Assistant

## 2012-11-20 ENCOUNTER — Ambulatory Visit (INDEPENDENT_AMBULATORY_CARE_PROVIDER_SITE_OTHER): Payer: Medicare Other | Admitting: Physician Assistant

## 2012-11-20 VITALS — BP 110/58 | HR 64 | Ht 72.0 in | Wt 219.4 lb

## 2012-11-20 DIAGNOSIS — I447 Left bundle-branch block, unspecified: Secondary | ICD-10-CM

## 2012-11-20 DIAGNOSIS — I2510 Atherosclerotic heart disease of native coronary artery without angina pectoris: Secondary | ICD-10-CM

## 2012-11-20 DIAGNOSIS — I428 Other cardiomyopathies: Secondary | ICD-10-CM | POA: Insufficient documentation

## 2012-11-20 NOTE — Patient Instructions (Addendum)
YOU HAVE A FOLLOW UP APPT WITH DR. Shirlee Latch 01/23/13 @ 9:30  NO CHANGES WERE MADE TODAY

## 2012-11-20 NOTE — Progress Notes (Signed)
[address], [address] [place], [place]  [address] Phone: [phone], Fax:  [phone]  Date:  11/20/2012   Name:  [place] [facility]   DOB:  16-Jun-1933   MRN:  [id number]  PCP:  Garlan Fillers, MD  Primary Cardiologist:  Dr. Marca [place]  Primary Electrophysiologist:  None    History of Present Illness: [place] [facility] is a 78 y.o. male who returns for follow up after recent heart cath.  Patient was initially evaluated by Dr. Shirlee [place]ier[place] 10/09/12 for chest pain. Patient was noted to have a left bundle branch block on his ECG. He was set up for Myoview. This demonstrated inferior and inferoseptal defect consistent with scar but no ischemia with an EF of 30%. Echocardiogram also demonstrated LV dysfunction with an EF of 40%. It was thought that this may represent cardiomyopathy secondary to left bundle branch block. Cardiac CT 10/19/12 did demonstrate calcium score of 318 with proximal LAD and distal circumflex plaque. He was ultimately set up for cardiac catheterization. LHC in 11/07/12 demonstrated diffuse moderate nonobstructive CAD. Patient was started on carvedilol and lisinopril as well as aspirin and atorvastatin.  Since his LHC, he is doing well aside from constipation.  The patient denies chest pain, shortness of breath, syncope, orthopnea, PND or significant pedal edema.   Labs (11/13):   K 4.2, creatinine 0.8 Labs (12/13):   K 4.3, creatinine 0.7, BNP 37, Hgb 12.8  Wt Readings from Last 3 Encounters:  11/07/12 215 lb (97.523 kg)  11/07/12 215 lb (97.523 kg)  11/07/12 215 lb (97.523 kg)     Past Medical History  Diagnosis Date  . Hiatal hernia   . Esophageal reflux   . Unspecified gastritis and gastroduodenitis without mention of hemorrhage   . Diverticulosis of colon (without mention of hemorrhage)   . Personal history of colonic polyps 07/07/2003    adenomatous and hyperplastic   . Interstitial cystitis   . Irritable bowel syndrome   . Hypogonadism male   .  LBBB (left bundle branch block)   . CAD (coronary artery disease)     a. Myoview 11/13:  EF 38%, inf and IS defect c/w scar but no ischemia:  b. Cardiac CT 11/13:  Ca score 318 Agatson units, pLAD and dCFX plaque;   c. LHC 11/07/12:  pLAD 30%, oD1 40%, oCFX 30%, dCFX 40-50%, mRCA 40%, EF 50% (frequent PVCs and short run of NSVT with injection)  . NICM (nonischemic cardiomyopathy)     ? 2/2 LBBB => a. echo 11/13: diff HK, worse in septum and apex, mod LVE, mild LVH, EF 40%, mild AI, mild MR, mod LAE    Current Outpatient Prescriptions  Medication Sig Dispense Refill  . aspirin EC 81 MG tablet Take 1 tablet (81 mg total) by mouth daily.      Marland Kitchen atorvastatin (LIPITOR) 20 MG tablet Take 1 tablet (20 mg total) by mouth daily.  90 tablet  3  . bethanechol (URECHOLINE) 25 MG tablet Take 1 tablet by mouth as directed.      . carvedilol (COREG) 3.125 MG tablet Take 1 tablet (3.125 mg total) by mouth 2 (two) times daily.  180 tablet  3  . docusate sodium (COLACE) 100 MG capsule Take 100 mg by mouth at bedtime.        Marland Kitchen lisinopril (PRINIVIL,ZESTRIL) 5 MG tablet Take 1 tablet (5 mg total) by mouth daily.  90 tablet  4  . metoprolol (LOPRESSOR) 50 MG tablet 1 tablet  2 hours before coronary CTA  1 tablet  0  . Misc Natural Products (COLON CLEANSER PO) Take 3 capsules by mouth 2 (two) times daily.        . Testosterone (AXIRON) 30 MG/ACT SOLN Place onto the skin. Once daily        Current Facility-Administered Medications  Medication Dose Route Frequency Provider Last Rate Last Dose  . 0.9 %  sodium chloride infusion  500 mL Intravenous Continuous Mardella Layman, MD        Allergies:   No Known Allergies  Social History:  The patient  reports that he has quit smoking. He does not have any smokeless tobacco history on file. He reports that he drinks alcohol. He reports that he does not use illicit drugs.   PHYSICAL EXAM: VS:  BP 110/58  Pulse 64  Ht 6' (1.829 m)  Wt 219 lb 6.4 oz (99.519 kg)  BMI  29.76 kg/m2 Well nourished, well developed, in no acute distress HEENT: normal Neck: no JVD Cardiac:  normal S1, S2; RRR; no murmur Lungs:  clear to auscultation bilaterally, no wheezing, rhonchi or rales Abd: soft, nontender, no hepatomegaly Ext: no edema; right wrist without hematoma or bruit  Skin: warm and dry Neuro:  CNs 2-12 intact, no focal abnormalities noted  EKG:  NSR, HR 64, 1st degree AVB, PR 64, LBBB      ASSESSMENT AND PLAN:  1. Coronary Artery Disease:   He had moderate, nonobstructive CAD by cardiac catheterization. Continue aspirin and statin.  2. Cardiomyopathy:   Nonischemic. Likely related to left bundle branch block. Continue ACE inhibitor and beta blocker therapy.  3. Constipation:   Increase dietary fiber.  4. Disposition:    followup with Dr. Shirlee Gutierrez in 2 months.     Signed, Tereso Newcomer, PA-C  2:14 PM 11/20/2012

## 2013-01-23 ENCOUNTER — Ambulatory Visit (INDEPENDENT_AMBULATORY_CARE_PROVIDER_SITE_OTHER): Payer: Medicare Other | Admitting: Cardiology

## 2013-01-23 ENCOUNTER — Encounter: Payer: Self-pay | Admitting: Cardiology

## 2013-01-23 VITALS — BP 128/66 | HR 60 | Ht 72.0 in | Wt 223.0 lb

## 2013-01-23 DIAGNOSIS — I428 Other cardiomyopathies: Secondary | ICD-10-CM

## 2013-01-23 DIAGNOSIS — I447 Left bundle-branch block, unspecified: Secondary | ICD-10-CM

## 2013-01-23 DIAGNOSIS — I2510 Atherosclerotic heart disease of native coronary artery without angina pectoris: Secondary | ICD-10-CM

## 2013-01-23 DIAGNOSIS — E785 Hyperlipidemia, unspecified: Secondary | ICD-10-CM

## 2013-01-23 MED ORDER — CARVEDILOL 6.25 MG PO TABS: 6.2500 mg | ORAL_TABLET | Freq: Two times a day (BID) | ORAL | Status: DC

## 2013-01-23 MED ORDER — LISINOPRIL 5 MG PO TABS: ORAL_TABLET | ORAL | Status: DC

## 2013-01-23 NOTE — Progress Notes (Signed)
Patient ID: [place] [facility]ier[facility], male   DOB: 1933/01/22, 79 y.o.   MRN: [id number] PCP: Dr. Eloise Harman  I saw [place] initially on 10/09/12 for atypical chest pain. He was noted to have a left bundle branch block on his ECG which may have been chronic (no prior ECGs available to me but he mentioned being told about an ECG abnormality in the past). He was set up for Myoview. This demonstrated an inferior and inferoseptal defect consistent with scar but no ischemia with an EF of 30%. Echocardiogram also demonstrated LV dysfunction with an EF of 40%.  Cardiac CT 10/19/12 did demonstrated calcium score of 318 with proximal LAD and distal circumflex plaque with potentially moderate stenosis. He was ultimately set up for cardiac catheterization. LHC on 11/07/12 demonstrated diffuse moderate nonobstructive CAD (proximal LAD, D1, and distal LCx). Patient was started on carvedilol and lisinopril as well as aspirin and atorvastatin.   He has tolerated medications with no significant side effects.  He has had no further chest pain.  No exertional dyspnea.  He walks his dog daily and golfs twice weekly.  He denies exercise limitation.   Labs (11/13): K 4.2, creatinine 0.8  Labs (12/13): K 4.3, creatinine 0.7, BNP 37, Hgb 12.8  ECG: NSR, LBBB  PMH: 1. Low testosterone 2. Interstitial cystitis 3. LBBB: probably chronic.  4. CAD:  ETT-Myoview (11/13) with inferior and inferoseptal scar and EF 30%.  Coronary CTA 11/13 with coronary calcium score 318 Agatston units and proximal LAD/distal LCx plaque with up to moderate stenosis.  LHC (12/13) with 30% proximal LAD, 40% ostial D1, 40-50% diffuse stenosis in the distal LCx.   5. Nonischemic cardiomyopathy: EF 30% by Myoview.  Echo (11/13) with EF 40%, diffuse hypokinesis worst at the apex.  Nonobstructive CAD on cath.  Possible LBBB cardiomyopathy.   SH: Lives in [place], retired, quit smoking 20+ years ago.   FH: Brother with CHF, died at 56  ROS: All systems reviewed  and negative except as per HPI.   Current Outpatient Prescriptions  Medication Sig Dispense Refill  . aspirin EC 81 MG tablet Take 1 tablet (81 mg total) by mouth daily.      Marland Kitchen atorvastatin (LIPITOR) 20 MG tablet Take 1 tablet (20 mg total) by mouth daily.  90 tablet  3  . bethanechol (URECHOLINE) 25 MG tablet Take 1 tablet by mouth as directed.      . docusate sodium (COLACE) 100 MG capsule Take 100 mg by mouth at bedtime.        . metoprolol (LOPRESSOR) 50 MG tablet 1 tablet  2 hours before coronary CTA  1 tablet  0  . Misc Natural Products (COLON CLEANSER PO) Take 3 capsules by mouth 2 (two) times daily.        . Testosterone (AXIRON) 30 MG/ACT SOLN Place onto the skin. Once daily       . carvedilol (COREG) 6.25 MG tablet Take 1 tablet (6.25 mg total) by mouth 2 (two) times daily.  60 tablet  11  . lisinopril (PRINIVIL,ZESTRIL) 5 MG tablet 1 and 1/2 tablets (total 7.5mg ) daily  45 tablet  11   Current Facility-Administered Medications  Medication Dose Route Frequency Provider Last Rate Last Dose  . 0.9 %  sodium chloride infusion  500 mL Intravenous Continuous Mardella Layman, MD        BP 128/66  Pulse 60  Ht 6' (1.829 m)  Wt 223 lb (101.152 kg)  BMI 30.24 kg/m2  SpO2  96% General: NAD Neck: No JVD, no thyromegaly or thyroid nodule.  Lungs: Clear to auscultation bilaterally with normal respiratory effort. CV: Nondisplaced PMI.  Heart regular S1/S2, paradoxical S2 split, no S3/S4, no murmur.  No peripheral edema.  No carotid bruit.  Normal pedal pulses.  Abdomen: Soft, nontender, no hepatosplenomegaly, no distention.   Neurologic: Alert and oriented x 3.  Psych: Normal affect. Extremities: No clubbing or cyanosis.   Assessment/Plan: 1. CAD: Nonobstructive CAD on cath in 12/13.  I do not think that it plays a role in his depressed EF.  Continue ASA 81 and statin.  2. Nonischemic cardiomyopathy: It is possible that this is a LBBB cardiomyopathy.  NYHA class I-II.  He is not  volume overloaded on exam.  Increase Coreg to 6.25 mg bid and lisinopril to 7.5 mg daily.  BMET in 1 week.  He will followup in 6 months and repeat echo in 12/14 to look for improvement with medical management.  3. Hyperlipidemia: Continue statin given known CAD.  I will get Lipids/LFTs.  Marca Ancona 01/23/2013

## 2013-01-23 NOTE — Patient Instructions (Addendum)
Increase coreg(carvedilol) to 6.25mg  two times a day. You can take 2 of your 3.125mg  tablets two times a day and use your current supply.  Increase lisinopril to 7.5mg  daily. This will be 1 and 1/2 of your 5mg  tablets daily at the same time.   Your physician recommends that you return for a FASTING lipid profile /BMET in 1 week.   Your physician wants you to follow-up in: 6 months with Dr Shirlee Latch. (August 2014). You will receive a reminder letter in the mail two months in advance. If you don't receive a letter, please call our office to schedule the follow-up appointment.

## 2013-01-30 ENCOUNTER — Other Ambulatory Visit (INDEPENDENT_AMBULATORY_CARE_PROVIDER_SITE_OTHER): Payer: Medicare Other

## 2013-01-30 DIAGNOSIS — I2510 Atherosclerotic heart disease of native coronary artery without angina pectoris: Secondary | ICD-10-CM

## 2013-01-30 LAB — LIPID PANEL
Cholesterol: 118 mg/dL (ref 0–200)
LDL Cholesterol: 52 mg/dL (ref 0–99)
Triglycerides: 53 mg/dL (ref 0.0–149.0)
VLDL: 10.6 mg/dL (ref 0.0–40.0)

## 2013-01-30 LAB — BASIC METABOLIC PANEL
BUN: 18 mg/dL (ref 6–23)
Chloride: 106 mEq/L (ref 96–112)
Creatinine, Ser: 0.8 mg/dL (ref 0.4–1.5)
Glucose, Bld: 97 mg/dL (ref 70–99)
Potassium: 4.8 mEq/L (ref 3.5–5.1)

## 2013-07-31 ENCOUNTER — Encounter: Payer: Self-pay | Admitting: Cardiology

## 2013-07-31 ENCOUNTER — Ambulatory Visit (INDEPENDENT_AMBULATORY_CARE_PROVIDER_SITE_OTHER): Payer: Medicare Other | Admitting: Cardiology

## 2013-07-31 VITALS — BP 118/70 | HR 76 | Ht 70.0 in | Wt 216.0 lb

## 2013-07-31 DIAGNOSIS — I2510 Atherosclerotic heart disease of native coronary artery without angina pectoris: Secondary | ICD-10-CM

## 2013-07-31 DIAGNOSIS — R079 Chest pain, unspecified: Secondary | ICD-10-CM

## 2013-07-31 DIAGNOSIS — I447 Left bundle-branch block, unspecified: Secondary | ICD-10-CM

## 2013-07-31 DIAGNOSIS — I428 Other cardiomyopathies: Secondary | ICD-10-CM

## 2013-07-31 NOTE — Progress Notes (Signed)
Patient ID: [place] [facility], male   DOB: October 22, 1933, 79 y.o.   MRN: [id number] PCP: Dr. Eloise Harman  I saw [place] [facility] initially on 10/09/12 for atypical chest pain. He was noted to have a left bundle branch block on his ECG which may have been chronic (no prior ECGs available to me but he mentioned being told about an ECG abnormality in the past). He was set up for Myoview. This demonstrated an inferior and inferoseptal defect consistent with scar but no ischemia with an EF of 30%. Echocardiogram also demonstrated LV dysfunction with an EF of 40%.  Cardiac CT 10/19/12 did demonstrated calcium score of 318 with proximal LAD and distal circumflex plaque with potentially moderate stenosis. He was ultimately set up for cardiac catheterization. LHC on 11/07/12 demonstrated diffuse moderate nonobstructive CAD (proximal LAD, D1, and distal LCx). Patient was started on carvedilol and lisinopril as well as aspirin and atorvastatin.   He has tolerated medications with no significant side effects.  He has had no further chest pain.  No exertional dyspnea.  He walks his dog daily.  Main problem currently is low back pain.  He is going to see a neurosurgeon next week and will likely have an MRI.     Labs (11/13): K 4.2, creatinine 0.8  Labs (12/13): K 4.3, creatinine 0.7, BNP 37, Hgb 12.8 Labs (2/14): LDL 52, HDL 55, K 4.8, creatinine 0.8  ECG: NSR, 1st degree AVB (246 msec), LBBB  PMH: 1. Low testosterone 2. Interstitial cystitis 3. LBBB: probably chronic.  4. CAD:  ETT-Myoview (11/13) with inferior and inferoseptal scar and EF 30%.  Coronary CTA 11/13 with coronary calcium score 318 Agatston units and proximal LAD/distal LCx plaque with up to moderate stenosis.  LHC (12/13) with 30% proximal LAD, 40% ostial D1, 40-50% diffuse stenosis in the distal LCx.   5. Nonischemic cardiomyopathy: EF 30% by Myoview.  Echo (11/13) with EF 40%, diffuse hypokinesis worst at the apex.  Nonobstructive CAD on cath.  Possible LBBB  cardiomyopathy.   SH: Lives in [place], retired, quit smoking 20+ years ago.   FH: Brother with CHF, died at 68   Current Outpatient Prescriptions  Medication Sig Dispense Refill  . aspirin EC 81 MG tablet Take 1 tablet (81 mg total) by mouth daily.      Marland Kitchen atorvastatin (LIPITOR) 20 MG tablet Take 1 tablet (20 mg total) by mouth daily.  90 tablet  3  . bethanechol (URECHOLINE) 25 MG tablet Take 1 tablet by mouth as directed.      . carvedilol (COREG) 6.25 MG tablet Take 1 tablet (6.25 mg total) by mouth 2 (two) times daily.  60 tablet  11  . docusate sodium (COLACE) 100 MG capsule Take 100 mg by mouth at bedtime.        Marland Kitchen lisinopril (PRINIVIL,ZESTRIL) 5 MG tablet 1 and 1/2 tablets (total 7.5mg ) daily  45 tablet  11  . metoprolol (LOPRESSOR) 50 MG tablet 1 tablet  2 hours before coronary CTA  1 tablet  0  . Misc Natural Products (COLON CLEANSER PO) Take 3 capsules by mouth 2 (two) times daily.         Current Facility-Administered Medications  Medication Dose Route Frequency Provider Last Rate Last Dose  . 0.9 %  sodium chloride infusion  500 mL Intravenous Continuous Mardella Layman, MD        BP 118/70  Pulse 76  Ht 5\' 10"  (1.778 m)  Wt 97.977 kg (216 lb)  BMI  30.99 kg/m2  SpO2 94% General: NAD Neck: No JVD, no thyromegaly or thyroid nodule.  Lungs: Clear to auscultation bilaterally with normal respiratory effort. CV: Nondisplaced PMI.  Heart regular S1/S2, paradoxical S2 split, no S3/S4, no murmur.  No peripheral edema.  No carotid bruit.  Normal pedal pulses.  Abdomen: Soft, nontender, no hepatosplenomegaly, no distention.   Neurologic: Alert and oriented x 3.  Psych: Normal affect. Extremities: No clubbing or cyanosis.   Assessment/Plan: 1. CAD: Nonobstructive CAD on cath in 12/13.  I do not think that it plays a role in his depressed EF.  Continue ASA 81 and statin.  2. Nonischemic cardiomyopathy: It is possible that this is a LBBB cardiomyopathy.  NYHA class I-II.   He is not volume overloaded on exam.  Continue current Coreg and lisinopril doses. BMET today.  He will followup in 6 months and repeat echo in 12/14 to look for improvement with medical management.  3. Hyperlipidemia: Continue statin given known CAD.  I will get Lipids.  Marca Ancona 07/31/2013

## 2013-07-31 NOTE — Patient Instructions (Addendum)
Your physician recommends that you return for a FASTING lipid profile /BMET.   Your physician has requested that you have an echocardiogram. Echocardiography is a painless test that uses sound waves to create images of your heart. It provides your doctor with information about the size and shape of your heart and how well your heart's chambers and valves are working. This procedure takes approximately one hour. There are no restrictions for this procedure. December 2014  Your physician wants you to follow-up in: 6 months with Dr Shirlee Latch. (February 2015).  You will receive a reminder letter in the mail two months in advance. If you don't receive a letter, please call our office to schedule the follow-up appointment.

## 2013-08-07 ENCOUNTER — Other Ambulatory Visit (INDEPENDENT_AMBULATORY_CARE_PROVIDER_SITE_OTHER): Payer: Medicare Other

## 2013-08-07 DIAGNOSIS — I447 Left bundle-branch block, unspecified: Secondary | ICD-10-CM

## 2013-08-07 DIAGNOSIS — R079 Chest pain, unspecified: Secondary | ICD-10-CM

## 2013-08-07 DIAGNOSIS — I428 Other cardiomyopathies: Secondary | ICD-10-CM

## 2013-08-07 LAB — BASIC METABOLIC PANEL
CO2: 22 mEq/L (ref 19–32)
Calcium: 8.9 mg/dL (ref 8.4–10.5)
Creatinine, Ser: 0.7 mg/dL (ref 0.4–1.5)
GFR: 126.24 mL/min (ref >60.00)
Glucose, Bld: 92 mg/dL (ref 70–99)
Sodium: 137 mEq/L (ref 135–145)

## 2013-08-07 LAB — LIPID PANEL: Total CHOL/HDL Ratio: 3

## 2013-09-27 ENCOUNTER — Other Ambulatory Visit: Payer: Self-pay | Admitting: Dermatology

## 2013-10-21 ENCOUNTER — Other Ambulatory Visit: Payer: Self-pay | Admitting: Cardiology

## 2013-10-22 ENCOUNTER — Telehealth: Payer: Self-pay | Admitting: Cardiology

## 2013-10-22 NOTE — Telephone Encounter (Signed)
Received request from Nurse fax box, documents faxed for surgical clearance. To: [facility] Fax number: [phone] Attention: 10/22/13/KM

## 2013-11-20 ENCOUNTER — Ambulatory Visit (HOSPITAL_COMMUNITY): Payer: Medicare Other | Attending: Cardiology | Admitting: Radiology

## 2013-11-20 ENCOUNTER — Encounter: Payer: Self-pay | Admitting: Cardiology

## 2013-11-20 DIAGNOSIS — I2510 Atherosclerotic heart disease of native coronary artery without angina pectoris: Secondary | ICD-10-CM

## 2013-11-20 DIAGNOSIS — I428 Other cardiomyopathies: Secondary | ICD-10-CM

## 2013-11-20 DIAGNOSIS — I447 Left bundle-branch block, unspecified: Secondary | ICD-10-CM | POA: Insufficient documentation

## 2013-11-20 DIAGNOSIS — R072 Precordial pain: Secondary | ICD-10-CM

## 2013-11-20 DIAGNOSIS — R079 Chest pain, unspecified: Secondary | ICD-10-CM

## 2013-11-20 NOTE — Progress Notes (Signed)
Echocardiogram performed.  

## 2013-12-12 ENCOUNTER — Telehealth: Payer: Self-pay | Admitting: Cardiology

## 2013-12-12 NOTE — Telephone Encounter (Signed)
Patient is returning your call, please call back to [phone].

## 2013-12-12 NOTE — Telephone Encounter (Signed)
New message    Questions about renal failure and whether or not his heart medication can cause this.

## 2013-12-12 NOTE — Telephone Encounter (Signed)
LMTCB

## 2013-12-12 NOTE — Telephone Encounter (Signed)
Dr [name] spoke with patient.

## 2013-12-30 ENCOUNTER — Other Ambulatory Visit (HOSPITAL_COMMUNITY): Payer: Medicare Other

## 2014-01-08 ENCOUNTER — Inpatient Hospital Stay: Admit: 2014-01-08 | Payer: Self-pay | Admitting: Orthopedic Surgery

## 2014-01-08 SURGERY — ARTHROPLASTY, KNEE, TOTAL
Anesthesia: General | Laterality: Left

## 2014-02-14 ENCOUNTER — Other Ambulatory Visit: Payer: Self-pay | Admitting: Cardiology

## 2014-02-17 ENCOUNTER — Other Ambulatory Visit: Payer: Self-pay | Admitting: Cardiology

## 2014-03-17 ENCOUNTER — Other Ambulatory Visit: Payer: Self-pay | Admitting: Cardiology

## 2014-04-11 ENCOUNTER — Ambulatory Visit: Payer: Medicare Other | Admitting: Cardiology

## 2014-04-16 ENCOUNTER — Other Ambulatory Visit: Payer: Self-pay | Admitting: Cardiology

## 2014-04-22 ENCOUNTER — Other Ambulatory Visit: Payer: Self-pay | Admitting: Cardiology

## 2014-04-23 ENCOUNTER — Ambulatory Visit (INDEPENDENT_AMBULATORY_CARE_PROVIDER_SITE_OTHER): Payer: Medicare Other | Admitting: Cardiology

## 2014-04-23 ENCOUNTER — Encounter: Payer: Self-pay | Admitting: Cardiology

## 2014-04-23 VITALS — BP 130/65 | HR 79 | Ht 70.0 in | Wt 220.0 lb

## 2014-04-23 DIAGNOSIS — E785 Hyperlipidemia, unspecified: Secondary | ICD-10-CM

## 2014-04-23 DIAGNOSIS — I2510 Atherosclerotic heart disease of native coronary artery without angina pectoris: Secondary | ICD-10-CM

## 2014-04-23 DIAGNOSIS — I428 Other cardiomyopathies: Secondary | ICD-10-CM

## 2014-04-23 NOTE — Patient Instructions (Addendum)
Your physician wants you to follow-up in: 6 months with Dr [name]. (November 2015). You will receive a reminder letter in the mail two months in advance. If you don't receive a letter, please call our office to schedule the follow-up appointment.   Start lisinopril 7.5mg  daily. This will be 1 and 1/2 of your 5mg  tablets daily at the same time.  Your physician recommends that you return for lab work on Wednesday June 3,2015. The lab is open from 7:30Am to 5PM.

## 2014-04-24 NOTE — Progress Notes (Signed)
Patient ID: [place], male   DOB: 1933/03/09, 80 y.o.   MRN: [id number] PCP: Dr. Philip Aspen  I saw [name] initially on 10/09/12 for atypical chest pain. He was noted to have a left bundle branch block on his ECG which may have been chronic (no prior ECGs available to me but he mentioned being told about an ECG abnormality in the past). He was set up for Myoview. This demonstrated an inferior and inferoseptal defect consistent with scar but no ischemia with an EF of 30%. Echocardiogram also demonstrated LV dysfunction with an EF of 40%.  Cardiac CT 10/19/12 did demonstrated calcium score of 318 with proximal LAD and distal circumflex plaque with potentially moderate stenosis. He was ultimately set up for cardiac catheterization. LHC on 11/07/12 demonstrated diffuse moderate nonobstructive CAD (proximal LAD, D1, and distal LCx). Patient was started on carvedilol and lisinopril as well as aspirin and atorvastatin.   Last echo in 12/14 showed EF 40-45% with mild LV dilation and diffuse hypokinesis.   He has had no further chest pain.  No exertional dyspnea.  He walks his dog daily.  He does his yardwork and golfs.  He has bilateral knee pain and low back pain.  He has a lot of trouble with interstitial cystitis. He though lisinopril might be making it worse so stopped lisinopril.  However, this did not help.  More recently, Valium has helped his symptoms.    Labs (11/13): K 4.2, creatinine 0.8  Labs (12/13): K 4.3, creatinine 0.7, BNP 37, Hgb 12.8 Labs (2/14): LDL 52, HDL 55, K 4.8, creatinine 0.8 Labs (9/14): K 4.7, creatinine 0.7, LDL 66, HDL 51  PMH: 1. Low testosterone 2. Interstitial cystitis 3. LBBB: Chronic  4. CAD:  ETT-Myoview (11/13) with inferior and inferoseptal scar and EF 30%.  Coronary CTA 11/13 with coronary calcium score 318 Agatston units and proximal LAD/distal LCx plaque with up to moderate stenosis.  LHC (12/13) with 30% proximal LAD, 40% ostial D1, 40-50% diffuse stenosis in the  distal LCx.   5. Nonischemic cardiomyopathy: EF 30% by Myoview.  Echo (11/13) with EF 40%, diffuse hypokinesis worst at the apex.  Nonobstructive CAD on cath.  Possible LBBB cardiomyopathy.  Echo (12/14) with EF 40-45%, mild LV dilation, diffuse hypokinesis, mild AI, mildly dilated RV, ascending aorta 4.0 cm.    SH: Lives in [place], retired, quit smoking 20+ years ago.   FH: Brother with CHF, died at 29  ROS: All systems reviewed and negative except as per HPI.    Current Outpatient Prescriptions  Medication Sig Dispense Refill  . aspirin EC 81 MG tablet Take 1 tablet (81 mg total) by mouth daily.      Marland Kitchen atorvastatin (LIPITOR) 20 MG tablet TAKE ONE TABLET BY MOUTH ONCE DAILY  90 tablet  1  . carvedilol (COREG) 6.25 MG tablet TAKE ONE TABLET TWICE DAILY  60 tablet  0  . diazepam (VALIUM) 5 MG tablet Take 1/2 tab twice a day      . docusate sodium (COLACE) 100 MG capsule Take 100 mg by mouth at bedtime.        . Misc Natural Products (COLON CLEANSER PO) Take 3 capsules by mouth 2 (two) times daily.        Marland Kitchen lisinopril (PRINIVIL,ZESTRIL) 5 MG tablet 1 and 1/2 tablets daily       Current Facility-Administered Medications  Medication Dose Route Frequency Provider Last Rate Last Dose  . 0.9 %  sodium chloride infusion  500  mL Intravenous Continuous Sable Feil, MD        BP 130/65  Pulse 79  Ht 5\' 10"  (1.778 m)  Wt 220 lb (99.791 kg)  BMI 31.57 kg/m2 General: NAD Neck: No JVD, no thyromegaly or thyroid nodule.  Lungs: Clear to auscultation bilaterally with normal respiratory effort. CV: Nondisplaced PMI.  Heart regular S1/S2, paradoxical S2 split, no S3/S4, no murmur.  No peripheral edema.  No carotid bruit.  Normal pedal pulses.  Abdomen: Soft, nontender, no hepatosplenomegaly, no distention.   Neurologic: Alert and oriented x 3.  Psych: Normal affect. Extremities: No clubbing or cyanosis.   Assessment/Plan: 1. CAD: Nonobstructive CAD on cath in 12/13.  I do not think  that it plays a role in his depressed EF.  Continue ASA 81 and statin.  2. Nonischemic cardiomyopathy: It is possible that this is a LBBB cardiomyopathy.  EF 40-45% on last echo which is stable.  NYHA class I-II.  He is not volume overloaded on exam.  Continue current Coreg.  I would like him to restart lisinopril 7.5 mg daily as I do not think that it affected his bladder.  Will get BMET in 10 days.  3. Hyperlipidemia: Continue statin given known CAD.  Good lipids in 9/14.   Larey Dresser 04/24/2014

## 2014-05-05 ENCOUNTER — Other Ambulatory Visit: Payer: Self-pay | Admitting: *Deleted

## 2014-05-05 DIAGNOSIS — I428 Other cardiomyopathies: Secondary | ICD-10-CM

## 2014-05-05 MED ORDER — LOSARTAN POTASSIUM 25 MG PO TABS: 25.0000 mg | ORAL_TABLET | Freq: Every day | ORAL | Status: DC

## 2014-05-07 ENCOUNTER — Other Ambulatory Visit: Payer: Self-pay

## 2014-05-15 ENCOUNTER — Other Ambulatory Visit: Payer: Self-pay | Admitting: Cardiology

## 2014-05-21 ENCOUNTER — Other Ambulatory Visit (INDEPENDENT_AMBULATORY_CARE_PROVIDER_SITE_OTHER): Payer: Managed Care, Other (non HMO)

## 2014-05-21 DIAGNOSIS — I428 Other cardiomyopathies: Secondary | ICD-10-CM

## 2014-05-21 LAB — BASIC METABOLIC PANEL
BUN: 17 mg/dL (ref 6–23)
CALCIUM: 8.9 mg/dL (ref 8.4–10.5)
CO2: 25 mEq/L (ref 19–32)
CREATININE: 0.8 mg/dL (ref 0.4–1.5)
Chloride: 108 mEq/L (ref 96–112)
GFR: 102.08 mL/min (ref >60.00)
Glucose, Bld: 103 mg/dL — ABNORMAL HIGH (ref 70–99)
Potassium: 5.1 mEq/L (ref 3.5–5.1)
Sodium: 139 mEq/L (ref 135–145)

## 2014-05-23 ENCOUNTER — Other Ambulatory Visit: Payer: Self-pay | Admitting: *Deleted

## 2014-06-27 ENCOUNTER — Other Ambulatory Visit: Payer: Self-pay

## 2014-08-19 ENCOUNTER — Other Ambulatory Visit: Payer: Self-pay | Admitting: Dermatology

## 2014-09-22 ENCOUNTER — Other Ambulatory Visit: Payer: Self-pay | Admitting: Cardiology

## 2014-10-01 ENCOUNTER — Encounter (HOSPITAL_COMMUNITY): Payer: Self-pay | Admitting: Pharmacy Technician

## 2014-10-01 NOTE — Patient Instructions (Addendum)
David Gutierrez  10/01/2014   Your procedure is scheduled on:  10/14/2014    Come thru the [place] Entrance.    Follow the Signs to [facility] at  0830      am  Call this number if you have problems the morning of surgery: [phone]   Remember:   Do not eat food or drink liquids after midnight.   Take these medicines the morning of surgery with A SIP OF WATER: Urecholine, Carvedilol( Coreg), diazepam ( Valium)    Do not wear jewelry,   Do not wear lotions, powders, or perfumes.  deodorant.  . Men may shave face and neck.  Do not bring valuables to the hospital.  Contacts, dentures or bridgework may not be worn into surgery.  Leave suitcase in the car. After surgery it may be brought to your room.  For patients admitted to the hospital, checkout time is 11:00 AM the day of  discharge.     [facility] - Preparing for Surgery Before surgery, you can play an important role.  Because skin is not sterile, your skin needs to be as free of germs as possible.  You can reduce the number of germs on your skin by washing with CHG (chlorahexidine gluconate) soap before surgery.  CHG is an antiseptic cleaner which kills germs and bonds with the skin to continue killing germs even after washing. Please DO NOT use if you have an allergy to CHG or antibacterial soaps.  If your skin becomes reddened/irritated stop using the CHG and inform your nurse when you arrive at Short Stay. Do not shave (including legs and underarms) for at least 48 hours prior to the first CHG shower.  You may shave your face/neck. Please follow these instructions carefully:  1.  Shower with CHG Soap the night before surgery and the  morning of Surgery.  2.  If you choose to wash your hair, wash your hair first as usual with your  normal  shampoo.  3.  After you shampoo, rinse your hair and body thoroughly to remove the  shampoo.                           4.  Use CHG as you would any other liquid soap.  You can apply chg  directly  to the skin and wash                       Gently with a scrungie or clean washcloth.  5.  Apply the CHG Soap to your body ONLY FROM THE NECK DOWN.   Do not use on face/ open                           Wound or open sores. Avoid contact with eyes, ears mouth and genitals (private parts).                       Wash face,  Genitals (private parts) with your normal soap.             6.  Wash thoroughly, paying special attention to the area where your surgery  will be performed.  7.  Thoroughly rinse your body with warm water from the neck down.  8.  DO NOT shower/wash with your normal soap after using and rinsing off  the CHG Soap.  9.  Pat yourself dry with a clean towel.            10.  Wear clean pajamas.            11.  Place clean sheets on your bed the night of your first shower and do not  sleep with pets. Day of Surgery : Do not apply any lotions/deodorants the morning of surgery.  Please wear clean clothes to the hospital/surgery center.  FAILURE TO FOLLOW THESE INSTRUCTIONS MAY RESULT IN THE CANCELLATION OF YOUR SURGERY PATIENT SIGNATURE_________________________________  NURSE SIGNATURE__________________________________  ________________________________________________________________________  WHAT IS A BLOOD TRANSFUSION? Blood Transfusion Information  A transfusion is the replacement of blood or some of its parts. Blood is made up of multiple cells which provide different functions.  Red blood cells carry oxygen and are used for blood loss replacement.  White blood cells fight against infection.  Platelets control bleeding.  Plasma helps clot blood.  Other blood products are available for specialized needs, such as hemophilia or other clotting disorders. BEFORE THE TRANSFUSION  Who gives blood for transfusions?   Healthy volunteers who are fully evaluated to make sure their blood is safe. This is blood bank blood. Transfusion therapy is the safest  it has ever been in the practice of medicine. Before blood is taken from a donor, a complete history is taken to make sure that person has no history of diseases nor engages in risky social behavior (examples are intravenous drug use or sexual activity with multiple partners). The donor's travel history is screened to minimize risk of transmitting infections, such as malaria. The donated blood is tested for signs of infectious diseases, such as HIV and hepatitis. The blood is then tested to be sure it is compatible with you in order to minimize the chance of a transfusion reaction. If you or a relative donates blood, this is often done in anticipation of surgery and is not appropriate for emergency situations. It takes many days to process the donated blood. RISKS AND COMPLICATIONS Although transfusion therapy is very safe and saves many lives, the main dangers of transfusion include:   Getting an infectious disease.  Developing a transfusion reaction. This is an allergic reaction to something in the blood you were given. Every precaution is taken to prevent this. The decision to have a blood transfusion has been considered carefully by your caregiver before blood is given. Blood is not given unless the benefits outweigh the risks. AFTER THE TRANSFUSION  Right after receiving a blood transfusion, you will usually feel much better and more energetic. This is especially true if your red blood cells have gotten low (anemic). The transfusion raises the level of the red blood cells which carry oxygen, and this usually causes an energy increase.  The nurse administering the transfusion will monitor you carefully for complications. HOME CARE INSTRUCTIONS  No special instructions are needed after a transfusion. You may find your energy is better. Speak with your caregiver about any limitations on activity for underlying diseases you may have. SEEK MEDICAL CARE IF:   Your condition is not improving after  your transfusion.  You develop redness or irritation at the intravenous (IV) site. SEEK IMMEDIATE MEDICAL CARE IF:  Any of the following symptoms occur over the next 12 hours:  Shaking chills.  You have a temperature by mouth above 102 F (38.9 C), not controlled by medicine.  Chest, back, or muscle pain.  People around you feel you are not acting correctly or  are confused.  Shortness of breath or difficulty breathing.  Dizziness and fainting.  You get a rash or develop hives.  You have a decrease in urine output.  Your urine turns a dark color or changes to pink, red, or brown. Any of the following symptoms occur over the next 10 days:  You have a temperature by mouth above 102 F (38.9 C), not controlled by medicine.  Shortness of breath.  Weakness after normal activity.  The white part of the eye turns yellow (jaundice).  You have a decrease in the amount of urine or are urinating less often.  Your urine turns a dark color or changes to pink, red, or brown. Document Released: 11/18/2000 Document Revised: 02/13/2012 Document Reviewed: 07/07/2008 ExitCare Patient Information 2014 [place].  _______________________________________________________________________  Incentive Spirometer  An incentive spirometer is a tool that can help keep your lungs clear and active. This tool measures how well you are filling your lungs with each breath. Taking long deep breaths may help reverse or decrease the chance of developing breathing (pulmonary) problems (especially infection) following:  A long period of time when you are unable to move or be active. BEFORE THE PROCEDURE   If the spirometer includes an indicator to show your best effort, your nurse or respiratory therapist will set it to a desired goal.  If possible, sit up straight or lean slightly forward. Try not to slouch.  Hold the incentive spirometer in an upright position. INSTRUCTIONS FOR USE  1. Sit on  the edge of your bed if possible, or sit up as far as you can in bed or on a chair. 2. Hold the incentive spirometer in an upright position. 3. Breathe out normally. 4. Place the mouthpiece in your mouth and seal your lips tightly around it. 5. Breathe in slowly and as deeply as possible, raising the piston or the ball toward the top of the column. 6. Hold your breath for 3-5 seconds or for as long as possible. Allow the piston or ball to fall to the bottom of the column. 7. Remove the mouthpiece from your mouth and breathe out normally. 8. Rest for a few seconds and repeat Steps 1 through 7 at least 10 times every 1-2 hours when you are awake. Take your time and take a few normal breaths between deep breaths. 9. The spirometer may include an indicator to show your best effort. Use the indicator as a goal to work toward during each repetition. 10. After each set of 10 deep breaths, practice coughing to be sure your lungs are clear. If you have an incision (the cut made at the time of surgery), support your incision when coughing by placing a pillow or rolled up towels firmly against it. Once you are able to get out of bed, walk around indoors and cough well. You may stop using the incentive spirometer when instructed by your caregiver.  RISKS AND COMPLICATIONS  Take your time so you do not get dizzy or light-headed.  If you are in pain, you may need to take or ask for pain medication before doing incentive spirometry. It is harder to take a deep breath if you are having pain. AFTER USE  Rest and breathe slowly and easily.  It can be helpful to keep track of a log of your progress. Your caregiver can provide you with a simple table to help with this. If you are using the spirometer at home, follow these instructions: [place] IF:  You are having difficultly using the spirometer.  You have trouble using the spirometer as often as instructed.  Your pain medication is not giving  enough relief while using the spirometer.  You develop fever of 100.5 F (38.1 C) or higher. SEEK IMMEDIATE MEDICAL CARE IF:   You cough up bloody sputum that had not been present before.  You develop fever of 102 F (38.9 C) or greater.  You develop worsening pain at or near the incision site. MAKE SURE YOU:   Understand these instructions.  Will watch your condition.  Will get help right away if you are not doing well or get worse. Document Released: 04/03/2007 Document Revised: 02/13/2012 Document Reviewed: 06/04/2007 ExitCare Patient Information 2014 ExitCare, [place].   ________________________________________________________________________    Please read over the following fact sheets that you were given: MRSA Information, coughing and deep breathing exercises, leg exercises

## 2014-10-02 ENCOUNTER — Ambulatory Visit (HOSPITAL_COMMUNITY)
Admission: RE | Admit: 2014-10-02 | Discharge: 2014-10-02 | Disposition: A | Discharge status: Home / Self Care | Payer: Medicare HMO | Source: Ambulatory Visit | Attending: Orthopedic Surgery | Admitting: Orthopedic Surgery

## 2014-10-02 ENCOUNTER — Encounter (HOSPITAL_COMMUNITY)
Admission: RE | Admit: 2014-10-02 | Discharge: 2014-10-02 | Disposition: A | Discharge status: Home / Self Care | Payer: Medicare HMO | Source: Ambulatory Visit | Attending: Orthopedic Surgery | Admitting: Orthopedic Surgery

## 2014-10-02 ENCOUNTER — Encounter (HOSPITAL_COMMUNITY): Payer: Self-pay

## 2014-10-02 DIAGNOSIS — R918 Other nonspecific abnormal finding of lung field: Secondary | ICD-10-CM | POA: Insufficient documentation

## 2014-10-02 DIAGNOSIS — I10 Essential (primary) hypertension: Secondary | ICD-10-CM

## 2014-10-02 DIAGNOSIS — Z01818 Encounter for other preprocedural examination: Secondary | ICD-10-CM | POA: Diagnosis not present

## 2014-10-02 HISTORY — DX: Unspecified osteoarthritis, unspecified site: M19.90

## 2014-10-02 LAB — URINALYSIS, ROUTINE W REFLEX MICROSCOPIC
Bilirubin Urine: NEGATIVE
GLUCOSE, UA: NEGATIVE mg/dL
Ketones, ur: NEGATIVE mg/dL
Nitrite: POSITIVE — AB
PH: 5.5 (ref 5.0–8.0)
Protein, ur: NEGATIVE mg/dL
SPECIFIC GRAVITY, URINE: 1.014 (ref 1.005–1.030)
Urobilinogen, UA: 0.2 mg/dL (ref 0.0–1.0)

## 2014-10-02 LAB — CBC
HEMATOCRIT: 38.7 % — AB (ref 39.0–52.0)
HEMOGLOBIN: 12.5 g/dL — AB (ref 13.0–17.0)
MCH: 31.3 pg (ref 26.0–34.0)
MCHC: 32.3 g/dL (ref 30.0–36.0)
MCV: 97 fL (ref 78.0–100.0)
Platelets: 102 10*3/uL — ABNORMAL LOW (ref 150–400)
RBC: 3.99 MIL/uL — AB (ref 4.22–5.81)
RDW: 17.3 % — ABNORMAL HIGH (ref 11.5–15.5)
WBC: 4.7 10*3/uL (ref 4.0–10.5)

## 2014-10-02 LAB — PROTIME-INR
INR: 1.12 (ref 0.00–1.49)
Prothrombin Time: 14.5 seconds (ref 11.6–15.2)

## 2014-10-02 LAB — BASIC METABOLIC PANEL
Anion gap: 9 (ref 5–15)
BUN: 13 mg/dL (ref 6–23)
CO2: 27 mEq/L (ref 19–32)
Calcium: 9 mg/dL (ref 8.4–10.5)
Chloride: 105 mEq/L (ref 96–112)
Creatinine, Ser: 0.68 mg/dL (ref 0.50–1.35)
GFR calc Af Amer: >90 mL/min (ref >90)
GFR calc non Af Amer: 88 mL/min — ABNORMAL LOW (ref >90)
GLUCOSE: 82 mg/dL (ref 70–99)
POTASSIUM: 4.2 meq/L (ref 3.7–5.3)
Sodium: 141 mEq/L (ref 137–147)

## 2014-10-02 LAB — SURGICAL PCR SCREEN
MRSA, PCR: NEGATIVE
Staphylococcus aureus: NEGATIVE

## 2014-10-02 LAB — URINE MICROSCOPIC-ADD ON

## 2014-10-02 LAB — APTT: aPTT: 35 seconds (ref 24–37)

## 2014-10-02 NOTE — Progress Notes (Signed)
Patient reports with previous joint replacement ( in the 1990s) had blisters on back when went home from hospital.  Unsure of what it was from.

## 2014-10-02 NOTE — Progress Notes (Signed)
CBC, U/A and micro results faxed via EPIC to Dr Alvan Dame.

## 2014-10-02 NOTE — Progress Notes (Signed)
CXR results doen 10/02/2014 faxed via EPIC to Dr Alvan Dame.

## 2014-10-02 NOTE — H&P (Signed)
TOTAL KNEE ADMISSION H&P  Patient is being admitted for left total knee arthroplasty.  Subjective:  Chief Complaint:   Left knee primary OA / pain.  HPI: David Gutierrez, 80 y.o. male, has a history of pain and functional disability in the left knee due to arthritis and has failed non-surgical conservative treatments for greater than 12 weeks to includeNSAID's and/or analgesics, corticosteriod injections, viscosupplementation injections and activity modification.  Onset of symptoms was gradual, starting 6+ years ago with gradually worsening course since that time. The patient noted prior procedures on the knee to include  arthroplasty on the right knee, per Dr. Unknown Foley.  Patient currently rates pain in the left knee(s) at 10 out of 10 with activity. Patient has worsening of pain with activity and weight bearing, pain that interferes with activities of daily living, pain with passive range of motion, crepitus and joint swelling.  Patient has evidence of periarticular osteophytes and joint space narrowing by imaging studies.  There is no active infection.  Risks, benefits and expectations were discussed with the patient.  Risks including but not limited to the risk of anesthesia, blood clots, nerve damage, blood vessel damage, failure of the prosthesis, infection and up to and including death.  Patient understand the risks, benefits and expectations and wishes to proceed with surgery.   PCP: Donnajean Lopes, MD  D/C Plans:      Home with HHPT  Post-op Meds:       No Rx given  Tranexamic Acid:      To be given - IV    Decadron:      Is to be given  FYI:     ASA post-op  Norco post-op    Patient Active Problem List   Diagnosis Date Noted  . Hyperlipidemia 01/23/2013  . Coronary atherosclerosis of native coronary artery 11/20/2012  . NICM (nonischemic cardiomyopathy) 11/20/2012  . LBBB (left bundle branch block) 10/09/2012  . Chest pain 10/09/2012   Past Medical History  Diagnosis Date  .  Hiatal hernia   . Esophageal reflux   . Unspecified gastritis and gastroduodenitis without mention of hemorrhage   . Diverticulosis of colon (without mention of hemorrhage)   . Personal history of colonic polyps 07/07/2003    adenomatous and hyperplastic   . Interstitial cystitis   . Irritable bowel syndrome   . Hypogonadism male   . LBBB (left bundle branch block)   . CAD (coronary artery disease)     a. Myoview 11/13:  EF 38%, inf and IS defect c/w scar but no ischemia:  b. Cardiac CT 11/13:  Ca score 318 Agatson units, pLAD and dCFX plaque;   c. LHC 11/07/12:  pLAD 30%, oD1 40%, oCFX 30%, dCFX 40-50%, mRCA 40%, EF 50% (frequent PVCs and short run of NSVT with injection)  . NICM (nonischemic cardiomyopathy)     ? 2/2 LBBB => a. echo 11/13: diff HK, worse in septum and apex, mod LVE, mild LVH, EF 40%, mild AI, mild MR, mod LAE  . Arthritis     Past Surgical History  Procedure Laterality Date  . Lumbar laminectomy  1952  . Replacement total knee      right  . Tonsillectomy and adenoidectomy    . Coronary angioplasty    . Transurethral resection of prostate      No prescriptions prior to admission   No Known Allergies   History  Substance Use Topics  . Smoking status: Former Research scientist (life sciences)  . Smokeless tobacco: Never Used  .  Alcohol Use: 0.0 oz/week     Comment: 2-3 drinks per day     Family History  Problem Relation Age of Onset  . Colon cancer Neg Hx   . Heart disease Brother   . Diabetes type I Son      Review of Systems  Constitutional: Negative.   HENT: Negative.   Eyes: Negative.   Respiratory: Negative.   Cardiovascular: Negative.   Gastrointestinal: Positive for heartburn and diarrhea.  Genitourinary: Positive for urgency and frequency.  Musculoskeletal: Positive for back pain and joint pain.  Skin: Negative.   Neurological: Negative.   Endo/Heme/Allergies: Negative.   Psychiatric/Behavioral: Negative.     Objective:  Physical Exam  Constitutional: He is  oriented to person, place, and time. He appears well-developed and well-nourished.  HENT:  Head: Normocephalic and atraumatic.  Eyes: Pupils are equal, round, and reactive to light.  Neck: Neck supple. No JVD present. No tracheal deviation present. No thyromegaly present.  Cardiovascular: Normal rate, regular rhythm, normal heart sounds and intact distal pulses.   Respiratory: Effort normal and breath sounds normal. No stridor. No respiratory distress. He has no wheezes.  GI: Soft. There is no tenderness. There is no guarding.  Musculoskeletal:       Left knee: He exhibits decreased range of motion, swelling and bony tenderness. He exhibits no ecchymosis, no deformity, no laceration and no erythema. Tenderness found.  Lymphadenopathy:    He has no cervical adenopathy.  Neurological: He is alert and oriented to person, place, and time.  Skin: Skin is warm and dry.  Psychiatric: He has a normal mood and affect.    Vital signs in last 24 hours: Temp:  [97.8 F (36.6 C)] 97.8 F (36.6 C) (10/29 0950) Pulse Rate:  [59] 59 (10/29 0950) Resp:  [16] 16 (10/29 0950) BP: (118)/(64) 118/64 mmHg (10/29 0950) SpO2:  [98 %] 98 % (10/29 0950) Weight:  [100.699 kg (222 lb)] 100.699 kg (222 lb) (10/29 0950)  Labs:   Estimated body mass index is 31.57 kg/(m^2) as calculated from the following:   Height as of 04/23/14: 5\' 10"  (1.778 m).   Weight as of 04/23/14: 99.791 kg (220 lb).   Imaging Review Plain radiographs demonstrate severe degenerative joint disease of the left knee(s). The overall alignment isneutral. The bone quality appears to be good for age and reported activity level.  Assessment/Plan:  End stage arthritis, left knee   The patient history, physical examination, clinical judgment of the provider and imaging studies are consistent with end stage degenerative joint disease of the left knee(s) and total knee arthroplasty is deemed medically necessary. The treatment options including  medical management, injection therapy arthroscopy and arthroplasty were discussed at length. The risks and benefits of total knee arthroplasty were presented and reviewed. The risks due to aseptic loosening, infection, stiffness, patella tracking problems, thromboembolic complications and other imponderables were discussed. The patient acknowledged the explanation, agreed to proceed with the plan and consent was signed. Patient is being admitted for inpatient treatment for surgery, pain control, PT, OT, prophylactic antibiotics, VTE prophylaxis, progressive ambulation and ADL's and discharge planning. The patient is planning to be discharged home with home health services.     [place] Pugh [place]   PA-C  10/02/2014, 4:27 PM

## 2014-10-02 NOTE — Progress Notes (Addendum)
Clearance - Dr [name] on chart  Cath and Stress test 2013 in EPIC  Dr [name] LOV 04/23/14 EPIC  ECHO 2014 in [facility]  EKG 10/02/14 EPIC

## 2014-10-14 ENCOUNTER — Inpatient Hospital Stay (HOSPITAL_COMMUNITY)
Admission: RE | Admit: 2014-10-14 | Discharge: 2014-10-16 | DRG: 470 | Disposition: A | Discharge status: Home Health | Payer: Medicare HMO | Source: Ambulatory Visit | Attending: Orthopedic Surgery | Admitting: Orthopedic Surgery

## 2014-10-14 ENCOUNTER — Inpatient Hospital Stay (HOSPITAL_COMMUNITY): Payer: Medicare HMO | Admitting: *Deleted

## 2014-10-14 ENCOUNTER — Encounter (HOSPITAL_COMMUNITY): Payer: Self-pay | Admitting: *Deleted

## 2014-10-14 ENCOUNTER — Encounter (HOSPITAL_COMMUNITY)
Admission: RE | Disposition: A | Discharge status: Home Health | Payer: Self-pay | Source: Ambulatory Visit | Attending: Orthopedic Surgery

## 2014-10-14 DIAGNOSIS — E291 Testicular hypofunction: Secondary | ICD-10-CM | POA: Diagnosis present

## 2014-10-14 DIAGNOSIS — Z96659 Presence of unspecified artificial knee joint: Secondary | ICD-10-CM

## 2014-10-14 DIAGNOSIS — K219 Gastro-esophageal reflux disease without esophagitis: Secondary | ICD-10-CM | POA: Diagnosis present

## 2014-10-14 DIAGNOSIS — E785 Hyperlipidemia, unspecified: Secondary | ICD-10-CM | POA: Diagnosis present

## 2014-10-14 DIAGNOSIS — M1712 Unilateral primary osteoarthritis, left knee: Secondary | ICD-10-CM | POA: Diagnosis present

## 2014-10-14 DIAGNOSIS — M25462 Effusion, left knee: Secondary | ICD-10-CM | POA: Diagnosis present

## 2014-10-14 DIAGNOSIS — M25762 Osteophyte, left knee: Secondary | ICD-10-CM | POA: Diagnosis present

## 2014-10-14 DIAGNOSIS — M25562 Pain in left knee: Secondary | ICD-10-CM | POA: Diagnosis present

## 2014-10-14 DIAGNOSIS — I447 Left bundle-branch block, unspecified: Secondary | ICD-10-CM | POA: Diagnosis present

## 2014-10-14 DIAGNOSIS — Z86010 Personal history of colonic polyps: Secondary | ICD-10-CM | POA: Diagnosis not present

## 2014-10-14 DIAGNOSIS — I2510 Atherosclerotic heart disease of native coronary artery without angina pectoris: Secondary | ICD-10-CM | POA: Diagnosis present

## 2014-10-14 DIAGNOSIS — M659 Synovitis and tenosynovitis, unspecified: Secondary | ICD-10-CM | POA: Diagnosis present

## 2014-10-14 DIAGNOSIS — K589 Irritable bowel syndrome without diarrhea: Secondary | ICD-10-CM | POA: Diagnosis present

## 2014-10-14 DIAGNOSIS — Z96652 Presence of left artificial knee joint: Secondary | ICD-10-CM

## 2014-10-14 DIAGNOSIS — N3010 Interstitial cystitis (chronic) without hematuria: Secondary | ICD-10-CM | POA: Diagnosis present

## 2014-10-14 DIAGNOSIS — Z87891 Personal history of nicotine dependence: Secondary | ICD-10-CM

## 2014-10-14 DIAGNOSIS — Z833 Family history of diabetes mellitus: Secondary | ICD-10-CM | POA: Diagnosis not present

## 2014-10-14 DIAGNOSIS — I429 Cardiomyopathy, unspecified: Secondary | ICD-10-CM | POA: Diagnosis present

## 2014-10-14 DIAGNOSIS — M1710 Unilateral primary osteoarthritis, unspecified knee: Secondary | ICD-10-CM | POA: Diagnosis present

## 2014-10-14 DIAGNOSIS — M179 Osteoarthritis of knee, unspecified: Secondary | ICD-10-CM | POA: Diagnosis present

## 2014-10-14 DIAGNOSIS — Z8249 Family history of ischemic heart disease and other diseases of the circulatory system: Secondary | ICD-10-CM

## 2014-10-14 HISTORY — PX: TOTAL KNEE ARTHROPLASTY: SHX125

## 2014-10-14 LAB — ABO/RH: ABO/RH(D): O NEG

## 2014-10-14 LAB — TYPE AND SCREEN
ABO/RH(D): O NEG
ANTIBODY SCREEN: NEGATIVE

## 2014-10-14 SURGERY — ARTHROPLASTY, KNEE, TOTAL
Anesthesia: Spinal | Site: Knee | Laterality: Left

## 2014-10-14 MED ORDER — FENTANYL CITRATE 0.05 MG/ML IJ SOLN
INTRAMUSCULAR | Status: DC | PRN
  Administered 2014-10-14: 50 ug via INTRAVENOUS

## 2014-10-14 MED ORDER — SODIUM CHLORIDE 0.9 % IV SOLN
INTRAVENOUS | Status: DC
  Administered 2014-10-14: 16:00:00 via INTRAVENOUS
  Filled 2014-10-14 (×5): qty 1000

## 2014-10-14 MED ORDER — ACETAMINOPHEN 325 MG PO TABS
650.0000 mg | ORAL_TABLET | Freq: Four times a day (QID) | ORAL | Status: DC | PRN
Start: 2014-10-14 — End: 2014-10-16

## 2014-10-14 MED ORDER — HYDROMORPHONE HCL 1 MG/ML IJ SOLN: 0.2500 mg | INTRAMUSCULAR | Status: DC | PRN

## 2014-10-14 MED ORDER — ONDANSETRON HCL 4 MG/2ML IJ SOLN: 4.0000 mg | Freq: Four times a day (QID) | INTRAMUSCULAR | Status: DC | PRN

## 2014-10-14 MED ORDER — PROPOFOL 10 MG/ML IV BOLUS
INTRAVENOUS | Status: AC
  Filled 2014-10-14: qty 20

## 2014-10-14 MED ORDER — ATORVASTATIN CALCIUM 20 MG PO TABS
20.0000 mg | ORAL_TABLET | Freq: Every day | ORAL | Status: DC
  Administered 2014-10-14 – 2014-10-16 (×3): 20 mg via ORAL
  Filled 2014-10-14 (×3): qty 1

## 2014-10-14 MED ORDER — OXYCODONE HCL 5 MG/5ML PO SOLN: 5.0000 mg | Freq: Once | ORAL | Status: DC | PRN

## 2014-10-14 MED ORDER — DOCUSATE SODIUM 100 MG PO CAPS
100.0000 mg | ORAL_CAPSULE | Freq: Two times a day (BID) | ORAL | Status: DC
  Administered 2014-10-14 – 2014-10-16 (×4): 100 mg via ORAL

## 2014-10-14 MED ORDER — OXYCODONE HCL 5 MG PO TABS: 5.0000 mg | ORAL_TABLET | Freq: Once | ORAL | Status: DC | PRN

## 2014-10-14 MED ORDER — CEFAZOLIN SODIUM-DEXTROSE 2-3 GM-% IV SOLR
2.0000 g | INTRAVENOUS | Status: AC
  Administered 2014-10-14: 2 g via INTRAVENOUS

## 2014-10-14 MED ORDER — MAGNESIUM CITRATE PO SOLN: 1.0000 | Freq: Once | ORAL | Status: AC | PRN

## 2014-10-14 MED ORDER — DIPHENHYDRAMINE HCL 25 MG PO CAPS
25.0000 mg | ORAL_CAPSULE | Freq: Four times a day (QID) | ORAL | Status: DC | PRN
  Administered 2014-10-15: 25 mg via ORAL
  Filled 2014-10-14: qty 1

## 2014-10-14 MED ORDER — LIDOCAINE HCL (CARDIAC) 20 MG/ML IV SOLN
INTRAVENOUS | Status: AC
  Filled 2014-10-14: qty 5

## 2014-10-14 MED ORDER — ZOLPIDEM TARTRATE 5 MG PO TABS: 5.0000 mg | ORAL_TABLET | Freq: Every evening | ORAL | Status: DC | PRN

## 2014-10-14 MED ORDER — CEFAZOLIN SODIUM-DEXTROSE 2-3 GM-% IV SOLR
INTRAVENOUS | Status: AC
  Filled 2014-10-14: qty 50

## 2014-10-14 MED ORDER — FENTANYL CITRATE 0.05 MG/ML IJ SOLN
INTRAMUSCULAR | Status: AC
  Filled 2014-10-14: qty 2

## 2014-10-14 MED ORDER — PHENYLEPHRINE 40 MCG/ML (10ML) SYRINGE FOR IV PUSH (FOR BLOOD PRESSURE SUPPORT)
PREFILLED_SYRINGE | INTRAVENOUS | Status: AC
  Filled 2014-10-14: qty 10

## 2014-10-14 MED ORDER — LACTATED RINGERS IV SOLN
INTRAVENOUS | Status: DC | PRN
  Administered 2014-10-14 (×2): via INTRAVENOUS

## 2014-10-14 MED ORDER — BUPIVACAINE-EPINEPHRINE (PF) 0.25% -1:200000 IJ SOLN
INTRAMUSCULAR | Status: AC
  Filled 2014-10-14: qty 30

## 2014-10-14 MED ORDER — DEXAMETHASONE SODIUM PHOSPHATE 10 MG/ML IJ SOLN
10.0000 mg | Freq: Once | INTRAMUSCULAR | Status: AC
  Administered 2014-10-15: 10 mg via INTRAVENOUS
  Filled 2014-10-14: qty 1

## 2014-10-14 MED ORDER — HYDROMORPHONE HCL 1 MG/ML IJ SOLN
0.5000 mg | INTRAMUSCULAR | Status: DC | PRN
  Administered 2014-10-15: 1 mg via INTRAVENOUS
  Filled 2014-10-14: qty 1

## 2014-10-14 MED ORDER — CARVEDILOL 6.25 MG PO TABS
6.2500 mg | ORAL_TABLET | Freq: Two times a day (BID) | ORAL | Status: DC
  Administered 2014-10-14 – 2014-10-16 (×4): 6.25 mg via ORAL
  Filled 2014-10-14 (×6): qty 1

## 2014-10-14 MED ORDER — PROPOFOL INFUSION 10 MG/ML OPTIME
INTRAVENOUS | Status: DC | PRN
  Administered 2014-10-14: 50 ug/kg/min via INTRAVENOUS

## 2014-10-14 MED ORDER — ASPIRIN EC 325 MG PO TBEC
325.0000 mg | DELAYED_RELEASE_TABLET | Freq: Every day | ORAL | Status: DC
  Administered 2014-10-15 – 2014-10-16 (×2): 325 mg via ORAL
  Filled 2014-10-14 (×3): qty 1

## 2014-10-14 MED ORDER — LACTATED RINGERS IV SOLN
INTRAVENOUS | Status: DC
  Administered 2014-10-14: 1000 mL via INTRAVENOUS

## 2014-10-14 MED ORDER — POLYETHYLENE GLYCOL 3350 17 G PO PACK
17.0000 g | PACK | Freq: Two times a day (BID) | ORAL | Status: DC
  Administered 2014-10-14 – 2014-10-15 (×3): 17 g via ORAL

## 2014-10-14 MED ORDER — SODIUM CHLORIDE 0.9 % IR SOLN
Status: DC | PRN
  Administered 2014-10-14: 1000 mL

## 2014-10-14 MED ORDER — MENTHOL 3 MG MT LOZG
1.0000 | LOZENGE | OROMUCOSAL | Status: DC | PRN
  Filled 2014-10-14: qty 9

## 2014-10-14 MED ORDER — KETOROLAC TROMETHAMINE 30 MG/ML IJ SOLN
INTRAMUSCULAR | Status: AC
  Filled 2014-10-14: qty 1

## 2014-10-14 MED ORDER — MIDAZOLAM HCL 2 MG/2ML IJ SOLN
INTRAMUSCULAR | Status: AC
  Filled 2014-10-14: qty 2

## 2014-10-14 MED ORDER — BUPIVACAINE-EPINEPHRINE (PF) 0.25% -1:200000 IJ SOLN
INTRAMUSCULAR | Status: DC | PRN
  Administered 2014-10-14: 30 mL

## 2014-10-14 MED ORDER — 0.9 % SODIUM CHLORIDE (POUR BTL) OPTIME
TOPICAL | Status: DC | PRN
  Administered 2014-10-14: 1000 mL

## 2014-10-14 MED ORDER — ACETAMINOPHEN 500 MG PO TABS
1000.0000 mg | ORAL_TABLET | Freq: Four times a day (QID) | ORAL | Status: AC
  Administered 2014-10-14 – 2014-10-15 (×2): 1000 mg via ORAL
  Filled 2014-10-14 (×3): qty 2

## 2014-10-14 MED ORDER — ONDANSETRON HCL 4 MG PO TABS: 4.0000 mg | ORAL_TABLET | Freq: Four times a day (QID) | ORAL | Status: DC | PRN

## 2014-10-14 MED ORDER — HYDROCODONE-ACETAMINOPHEN 7.5-325 MG PO TABS
1.0000 | ORAL_TABLET | ORAL | Status: DC
  Administered 2014-10-14 (×3): 2 via ORAL
  Administered 2014-10-15 (×3): 1 via ORAL
  Administered 2014-10-15: 2 via ORAL
  Administered 2014-10-15 – 2014-10-16 (×5): 1 via ORAL
  Filled 2014-10-14: qty 1
  Filled 2014-10-14 (×2): qty 2
  Filled 2014-10-14: qty 1
  Filled 2014-10-14: qty 2
  Filled 2014-10-14 (×2): qty 1
  Filled 2014-10-14: qty 2
  Filled 2014-10-14 (×3): qty 1
  Filled 2014-10-14: qty 2

## 2014-10-14 MED ORDER — CHLORHEXIDINE GLUCONATE 4 % EX LIQD
60.0000 mL | Freq: Once | CUTANEOUS | Status: DC
Start: 2014-10-14 — End: 2014-10-14

## 2014-10-14 MED ORDER — BETHANECHOL CHLORIDE 25 MG PO TABS
25.0000 mg | ORAL_TABLET | Freq: Two times a day (BID) | ORAL | Status: DC
  Administered 2014-10-14 – 2014-10-16 (×5): 25 mg via ORAL
  Filled 2014-10-14 (×6): qty 1

## 2014-10-14 MED ORDER — PROMETHAZINE HCL 25 MG/ML IJ SOLN: 6.2500 mg | INTRAMUSCULAR | Status: DC | PRN

## 2014-10-14 MED ORDER — METHOCARBAMOL 500 MG PO TABS
500.0000 mg | ORAL_TABLET | Freq: Four times a day (QID) | ORAL | Status: DC | PRN
  Administered 2014-10-15 – 2014-10-16 (×2): 500 mg via ORAL
  Filled 2014-10-14 (×3): qty 1

## 2014-10-14 MED ORDER — CARVEDILOL 6.25 MG PO TABS
6.2500 mg | ORAL_TABLET | ORAL | Status: AC
  Administered 2014-10-14: 6.25 mg via ORAL
  Filled 2014-10-14: qty 1

## 2014-10-14 MED ORDER — SODIUM CHLORIDE 0.9 % IJ SOLN
INTRAMUSCULAR | Status: DC | PRN
  Administered 2014-10-14: 30 mL via INTRAVENOUS

## 2014-10-14 MED ORDER — PHENOL 1.4 % MT LIQD
1.0000 | OROMUCOSAL | Status: DC | PRN
  Filled 2014-10-14: qty 177

## 2014-10-14 MED ORDER — METOCLOPRAMIDE HCL 10 MG PO TABS: 5.0000 mg | ORAL_TABLET | Freq: Three times a day (TID) | ORAL | Status: DC | PRN

## 2014-10-14 MED ORDER — BUPIVACAINE IN DEXTROSE 0.75-8.25 % IT SOLN
INTRATHECAL | Status: DC | PRN
  Administered 2014-10-14: 2 mL via INTRATHECAL

## 2014-10-14 MED ORDER — ACETAMINOPHEN 650 MG RE SUPP: 650.0000 mg | Freq: Four times a day (QID) | RECTAL | Status: DC | PRN

## 2014-10-14 MED ORDER — ALUM & MAG HYDROXIDE-SIMETH 200-200-20 MG/5ML PO SUSP: 30.0000 mL | ORAL | Status: DC | PRN

## 2014-10-14 MED ORDER — ONDANSETRON HCL 4 MG/2ML IJ SOLN
INTRAMUSCULAR | Status: DC | PRN
  Administered 2014-10-14: 4 mg via INTRAVENOUS

## 2014-10-14 MED ORDER — DIAZEPAM 5 MG PO TABS
2.5000 mg | ORAL_TABLET | Freq: Two times a day (BID) | ORAL | Status: DC
  Administered 2014-10-14 – 2014-10-16 (×5): 2.5 mg via ORAL
  Filled 2014-10-14 (×5): qty 1

## 2014-10-14 MED ORDER — METOCLOPRAMIDE HCL 5 MG/ML IJ SOLN: 5.0000 mg | Freq: Three times a day (TID) | INTRAMUSCULAR | Status: DC | PRN

## 2014-10-14 MED ORDER — MIDAZOLAM HCL 5 MG/5ML IJ SOLN
INTRAMUSCULAR | Status: DC | PRN
  Administered 2014-10-14 (×2): 1 mg via INTRAVENOUS

## 2014-10-14 MED ORDER — PROPOFOL INFUSION 10 MG/ML OPTIME
INTRAVENOUS | Status: DC | PRN
  Administered 2014-10-14: 10 mL via INTRAVENOUS

## 2014-10-14 MED ORDER — DEXAMETHASONE SODIUM PHOSPHATE 10 MG/ML IJ SOLN
10.0000 mg | Freq: Once | INTRAMUSCULAR | Status: AC
  Administered 2014-10-14: 10 mg via INTRAVENOUS

## 2014-10-14 MED ORDER — TRANEXAMIC ACID 100 MG/ML IV SOLN
1000.0000 mg | Freq: Once | INTRAVENOUS | Status: AC
  Administered 2014-10-14: 1000 mg via INTRAVENOUS
  Filled 2014-10-14: qty 10

## 2014-10-14 MED ORDER — KETOROLAC TROMETHAMINE 30 MG/ML IJ SOLN
INTRAMUSCULAR | Status: DC | PRN
  Administered 2014-10-14: 30 mg via INTRAVENOUS

## 2014-10-14 MED ORDER — SODIUM CHLORIDE 0.9 % IJ SOLN
INTRAMUSCULAR | Status: AC
  Filled 2014-10-14: qty 50

## 2014-10-14 MED ORDER — DEXAMETHASONE SODIUM PHOSPHATE 10 MG/ML IJ SOLN
INTRAMUSCULAR | Status: AC
  Filled 2014-10-14: qty 1

## 2014-10-14 MED ORDER — PHENYLEPHRINE HCL 10 MG/ML IJ SOLN
INTRAMUSCULAR | Status: DC | PRN
  Administered 2014-10-14 (×2): 60 mg via INTRAVENOUS

## 2014-10-14 MED ORDER — BISACODYL 10 MG RE SUPP: 10.0000 mg | Freq: Every day | RECTAL | Status: DC | PRN

## 2014-10-14 MED ORDER — CEFAZOLIN SODIUM-DEXTROSE 2-3 GM-% IV SOLR
2.0000 g | Freq: Four times a day (QID) | INTRAVENOUS | Status: AC
  Administered 2014-10-14 (×2): 2 g via INTRAVENOUS
  Filled 2014-10-14 (×2): qty 50

## 2014-10-14 MED ORDER — METHOCARBAMOL 1000 MG/10ML IJ SOLN
500.0000 mg | Freq: Four times a day (QID) | INTRAVENOUS | Status: DC | PRN
  Administered 2014-10-14: 500 mg via INTRAVENOUS
  Filled 2014-10-14 (×2): qty 5

## 2014-10-14 SURGICAL SUPPLY — 56 items
ADH SKN CLS APL DERMABOND .7 (GAUZE/BANDAGES/DRESSINGS) ×1
BAG SPEC THK2 15X12 ZIP CLS (MISCELLANEOUS)
BAG ZIPLOCK 12X15 (MISCELLANEOUS) IMPLANT
BANDAGE ELASTIC 6 VELCRO ST LF (GAUZE/BANDAGES/DRESSINGS) ×2 IMPLANT
BANDAGE ESMARK 6X9 LF (GAUZE/BANDAGES/DRESSINGS) ×1 IMPLANT
BLADE SAW SGTL 13.0X1.19X90.0M (BLADE) ×2 IMPLANT
BNDG CMPR 9X6 STRL LF SNTH (GAUZE/BANDAGES/DRESSINGS) ×1
BNDG ESMARK 6X9 LF (GAUZE/BANDAGES/DRESSINGS) ×2
BOWL SMART MIX CTS (DISPOSABLE) ×2 IMPLANT
CAPT RP KNEE ×1 IMPLANT
CEMENT HV SMART SET (Cement) ×2 IMPLANT
CUFF TOURN SGL QUICK 34 (TOURNIQUET CUFF) ×2
CUFF TRNQT CYL 34X4X40X1 (TOURNIQUET CUFF) ×1 IMPLANT
DECANTER SPIKE VIAL GLASS SM (MISCELLANEOUS) ×2 IMPLANT
DERMABOND ADVANCED (GAUZE/BANDAGES/DRESSINGS) ×1
DERMABOND ADVANCED .7 DNX12 (GAUZE/BANDAGES/DRESSINGS) IMPLANT
DRAPE EXTREMITY TIBURON (DRAPES) ×2 IMPLANT
DRAPE POUCH INSTRU U-SHP 10X18 (DRAPES) ×2 IMPLANT
DRAPE U-SHAPE 47X51 STRL (DRAPES) ×2 IMPLANT
DRSG AQUACEL AG ADV 3.5X10 (GAUZE/BANDAGES/DRESSINGS) ×2 IMPLANT
DURAPREP 26ML APPLICATOR (WOUND CARE) ×4 IMPLANT
ELECT REM PT RETURN 9FT ADLT (ELECTROSURGICAL) ×2
ELECTRODE REM PT RTRN 9FT ADLT (ELECTROSURGICAL) ×1 IMPLANT
FACESHIELD WRAPAROUND (MASK) ×10 IMPLANT
FACESHIELD WRAPAROUND OR TEAM (MASK) ×5 IMPLANT
FEMUR SIGMA PS SZ 5.0 L (Femur) ×1 IMPLANT
GLOVE BIOGEL PI IND STRL 7.5 (GLOVE) ×1 IMPLANT
GLOVE BIOGEL PI IND STRL 8.5 (GLOVE) ×1 IMPLANT
GLOVE BIOGEL PI INDICATOR 7.5 (GLOVE) ×1
GLOVE BIOGEL PI INDICATOR 8.5 (GLOVE) ×1
GLOVE ECLIPSE 8.0 STRL XLNG CF (GLOVE) ×2 IMPLANT
GLOVE ORTHO TXT STRL SZ7.5 (GLOVE) ×4 IMPLANT
GOWN SPEC L3 XXLG W/TWL (GOWN DISPOSABLE) ×2 IMPLANT
GOWN STRL REUS W/TWL LRG LVL3 (GOWN DISPOSABLE) ×2 IMPLANT
HANDPIECE INTERPULSE COAX TIP (DISPOSABLE) ×2
KIT BASIN OR (CUSTOM PROCEDURE TRAY) ×2 IMPLANT
LIQUID BAND (GAUZE/BANDAGES/DRESSINGS) ×2 IMPLANT
MANIFOLD NEPTUNE II (INSTRUMENTS) ×2 IMPLANT
NDL SAFETY ECLIPSE 18X1.5 (NEEDLE) ×1 IMPLANT
NEEDLE HYPO 18GX1.5 SHARP (NEEDLE) ×2
PACK TOTAL JOINT (CUSTOM PROCEDURE TRAY) ×2 IMPLANT
POSITIONER SURGICAL ARM (MISCELLANEOUS) ×2 IMPLANT
SET HNDPC FAN SPRY TIP SCT (DISPOSABLE) ×1 IMPLANT
SET PAD KNEE POSITIONER (MISCELLANEOUS) ×2 IMPLANT
SUCTION FRAZIER 12FR DISP (SUCTIONS) ×2 IMPLANT
SUT MNCRL AB 4-0 PS2 18 (SUTURE) ×2 IMPLANT
SUT VIC AB 1 CT1 36 (SUTURE) ×2 IMPLANT
SUT VIC AB 2-0 CT1 27 (SUTURE) ×6
SUT VIC AB 2-0 CT1 TAPERPNT 27 (SUTURE) ×3 IMPLANT
SUT VLOC 180 0 24IN GS25 (SUTURE) ×2 IMPLANT
SYR 50ML LL SCALE MARK (SYRINGE) ×2 IMPLANT
TOWEL OR 17X26 10 PK STRL BLUE (TOWEL DISPOSABLE) ×2 IMPLANT
TOWEL OR NON WOVEN STRL DISP B (DISPOSABLE) IMPLANT
TRAY FOLEY BAG SILVER LF 16FR (CATHETERS) ×1 IMPLANT
WATER STERILE IRR 1500ML POUR (IV SOLUTION) ×2 IMPLANT
WRAP KNEE MAXI GEL POST OP (GAUZE/BANDAGES/DRESSINGS) ×2 IMPLANT

## 2014-10-14 NOTE — Interval H&P Note (Signed)
History and Physical Interval Note:  10/14/2014 10:14 AM  David Gutierrez  has presented today for surgery, with the diagnosis of left knee osteoarthritis  The various methods of treatment have been discussed with the patient and family. After consideration of risks, benefits and other options for treatment, the patient has consented to  Procedure(s): LEFT TOTAL KNEE ARTHROPLASTY (Left) as a surgical intervention .  The patient's history has been reviewed, patient examined, no change in status, stable for surgery.  I have reviewed the patient's chart and labs.  Questions were answered to the patient's satisfaction.     [name]

## 2014-10-14 NOTE — Anesthesia Postprocedure Evaluation (Signed)
  Anesthesia Post-op Note  Patient: David Gutierrez  Procedure(s) Performed: Procedure(s): LEFT TOTAL KNEE ARTHROPLASTY (Left)  Patient Location: PACU  Anesthesia Type:Spinal  Level of Consciousness: awake, alert  and oriented  Airway and Oxygen Therapy: Patient Spontanous Breathing  Post-op Pain: none  Post-op Assessment: Post-op Vital signs reviewed  Post-op Vital Signs: Reviewed  Last Vitals:  Filed Vitals:   10/14/14 1415  BP:   Pulse:   Temp: 36.5 C  Resp:     Complications: No apparent anesthesia complications

## 2014-10-14 NOTE — Transfer of Care (Signed)
Immediate Anesthesia Transfer of Care Note  Patient: David Gutierrez  Procedure(s) Performed: Procedure(s): LEFT TOTAL KNEE ARTHROPLASTY (Left)  Patient Location: PACU  Anesthesia Type:Spinal  Level of Consciousness: awake, alert , oriented and patient cooperative  Airway & Oxygen Therapy: Patient Spontanous Breathing and Patient connected to face mask oxygen  Post-op Assessment: Report given to PACU RN, Post -op Vital signs reviewed and stable and Patient moving all extremities  Post vital signs: Reviewed and stable  Complications: No apparent anesthesia complications

## 2014-10-14 NOTE — Anesthesia Procedure Notes (Signed)
Spinal Patient location during procedure: OR Staffing Anesthesiologist: Suzette Battiest E Performed by: anesthesiologist  Preanesthetic Checklist Completed: patient identified, site marked, surgical consent, pre-op evaluation, timeout performed, IV checked, risks and benefits discussed and monitors and equipment checked Spinal Block Patient position: sitting Prep: Betadine Patient monitoring: heart rate, continuous pulse ox and blood pressure Approach: midline Location: L3-4 Injection technique: single-shot Needle Needle type: Spinocan  Needle gauge: 22 G Needle length: 9 cm Additional Notes Expiration date of kit checked and confirmed. Patient tolerated procedure well, without complications.

## 2014-10-14 NOTE — Anesthesia Preprocedure Evaluation (Addendum)
Anesthesia Evaluation  Patient identified by MRN, date of birth, ID band Patient awake    Reviewed: Allergy & Precautions, H&P , NPO status , Patient's Chart, lab work & pertinent test results  Airway Mallampati: III  TM Distance: >3 FB Neck ROM: Limited    Dental  (+) Teeth Intact, Dental Advisory Given   Pulmonary former smoker,          Cardiovascular + CAD (Mild diffuse CAD on 12/13 LHC) + dysrhythmias (LBBB)  2014 TTE: EF 40-45%, Mild AI. No regional wall motion abnormalities   Neuro/Psych negative neurological ROS  negative psych ROS   GI/Hepatic Neg liver ROS, hiatal hernia, GERD-  ,  Endo/Other  negative endocrine ROS  Renal/GU negative Renal ROS     Musculoskeletal  (+) Arthritis -,   Abdominal   Peds  Hematology  (+) Blood dyscrasia (Thrombocytopenia- Plts 102), anemia ,   Anesthesia Other Findings   Reproductive/Obstetrics                            Anesthesia Physical Anesthesia Plan  ASA: III  Anesthesia Plan: Spinal   Post-op Pain Management:    Induction: Intravenous  Airway Management Planned: Natural Airway and Simple Face Mask  Additional Equipment:   Intra-op Plan:   Post-operative Plan:   Informed Consent: I have reviewed the patients History and Physical, chart, labs and discussed the procedure including the risks, benefits and alternatives for the proposed anesthesia with the patient or authorized representative who has indicated his/her understanding and acceptance.   Dental advisory given  Plan Discussed with: CRNA and Surgeon  Anesthesia Plan Comments:         Anesthesia Quick Evaluation

## 2014-10-14 NOTE — Plan of Care (Signed)
Problem: Phase I Progression Outcomes Goal: Dangle or out of bed evening of surgery Outcome: Completed/Met Date Met:  10/14/14  Problem: Phase II Progression Outcomes Goal: Tolerating diet Outcome: Completed/Met Date Met:  10/14/14  Problem: Phase III Progression Outcomes Goal: Pain controlled on oral analgesia Outcome: Progressing

## 2014-10-14 NOTE — Op Note (Signed)
NAME:  David Gutierrez                      MEDICAL RECORD NO.:  [id number]                             FACILITY:  [facility]      PHYSICIAN:  Pietro Cassis. Alvan Dame, M.D.  DATE OF BIRTH:  [birth year]      DATE OF PROCEDURE:  10/14/2014                                     OPERATIVE REPORT         PREOPERATIVE DIAGNOSIS:  Left knee osteoarthritis.      POSTOPERATIVE DIAGNOSIS:  Left knee osteoarthritis.      FINDINGS:  The patient was noted to have complete loss of cartilage and   bone-on-bone arthritis with associated osteophytes in the medial and patellofemoral compartments of   the knee with a significant synovitis and associated effusion.      PROCEDURE:  Left total knee replacement.      COMPONENTS USED:  DePuy rotating platform posterior stabilized knee   system, a size 5 femur, 5 tibia, 12.5 mm PS insert, and 41 patellar   button.      SURGEON:  Pietro Cassis. Alvan Dame, M.D.      ASSISTANT:  Nehemiah Massed, PA-C.      ANESTHESIA:  Spinal.      SPECIMENS:  None.      COMPLICATION:  None.      DRAINS:  None.  EBL: <100cc      TOURNIQUET TIME:   Total Tourniquet Time Documented: Thigh (Left) - 31 minutes Total: Thigh (Left) - 31 minutes  .      The patient was stable to the recovery room.      INDICATION FOR PROCEDURE:  David Gutierrez is a 80 y.o. male patient of   mine.  The patient had been seen, evaluated, and treated conservatively in the   office with medication, activity modification, and injections.  The patient had   radiographic changes of bone-on-bone arthritis with endplate sclerosis and osteophytes noted.      The patient failed conservative measures including medication, injections, and activity modification, and at this point was ready for more definitive measures.   Based on the radiographic changes and failed conservative measures, the patient   decided to proceed with total knee replacement.  Risks of infection,   DVT, component failure, need for revision surgery,  postop course, and   expectations were all   discussed and reviewed.  Consent was obtained for benefit of pain   relief.      PROCEDURE IN DETAIL:  The patient was brought to the operative theater.   Once adequate anesthesia, preoperative antibiotics, 2 gm of Ancef administered, the patient was positioned supine with the left thigh tourniquet placed.  The  left lower extremity was prepped and draped in sterile fashion.  A time-   out was performed identifying the patient, planned procedure, and   extremity.      The left lower extremity was placed in the [facility] leg holder.  The leg was   exsanguinated, tourniquet elevated to 250 mmHg.  A midline incision was   made followed by median parapatellar arthrotomy.  Following initial   exposure, attention was first  directed to the patella.  Precut   measurement was noted to be 25 mm.  I resected down to 14-15 mm and used a   41 patellar button to restore patellar height as well as cover the cut   surface.      The lug holes were drilled and a metal shim was placed to protect the   patella from retractors and saw blades.      At this point, attention was now directed to the femur.  The femoral   canal was opened with a drill, irrigated to try to prevent fat emboli.  An   intramedullary rod was passed at 5 degrees valgus, 10 mm of bone was   resected off the distal femur.  Following this resection, the tibia was   subluxated anteriorly.  Using the extramedullary guide, 10 mm of bone was resected off   the proximal lateral tibia.  We confirmed the gap would be   stable medially and laterally with a 10 mm insert as well as confirmed   the cut was perpendicular in the coronal plane, checking with an alignment rod.      Once this was done, I sized the femur to be a size 5 in the anterior-   posterior dimension, chose a standard component based on medial and   lateral dimension.  The size 5 rotation block was then pinned in   position anterior  referenced using the C-clamp to set rotation.  The   anterior, posterior, and  chamfer cuts were made without difficulty nor   notching making certain that I was along the anterior cortex to help   with flexion gap stability.      The final box cut was made off the lateral aspect of distal femur.      At this point, the tibia was sized to be a size 5, the size 5 tray was   then pinned in position through the medial third of the tubercle,   drilled, and keel punched.  Trial reduction was now carried with a 5 femur,  5 tibia, a 10 then 12.5 mm insert, and the 41 patella botton.  The knee was brought to   extension, full extension with good flexion stability with the patella   tracking through the trochlea without application of pressure.  Given   all these findings, the trial components removed.  Final components were   opened and cement was mixed.  The knee was irrigated with normal saline   solution and pulse lavage.  The synovial lining was   then injected with 30cc of 0.25% Marcaine with epinephrine, 1 cc of Toradol and 30cc of NS for a    total of 61 cc.      The knee was irrigated.  Final implants were then cemented onto clean and   dried cut surfaces of bone with the knee brought to extension with a 12.5 mm trial insert.      Once the cement had fully cured, the excess cement was removed   throughout the knee.  I confirmed I was satisfied with the range of   motion and stability, and the final 12.5 mm PS insert was chosen.  It was   placed into the knee.      The tourniquet had been let down at 41 minutes.  No significant   hemostasis required.  The   extensor mechanism was then reapproximated using #1 Vicryl with the knee   in flexion.  The  remaining wound was closed with 2-0 Vicryl and running 4-0 Monocryl.   The knee was cleaned, dried, dressed sterilely using Dermabond and   Aquacel dressing.  The patient was then   brought to recovery room in stable condition, tolerating  the procedure   well.   Please note that Physician Assistant, Nehemiah Massed, PA-C, was present for the entirety of the case, and was utilized for pre-operative positioning, peri-operative retractor management, general facilitation of the procedure.  He was also utilized for primary wound closure at the end of the case.              Pietro Cassis Alvan Dame, M.D.    10/14/2014 12:53 PM

## 2014-10-14 NOTE — Progress Notes (Signed)
Utilization review completed.  

## 2014-10-14 NOTE — Plan of Care (Signed)
Problem: Consults Goal: Diagnosis- Total Joint Replacement left total knee  Problem: Phase I Progression Outcomes Goal: CMS/Neurovascular status WDL Outcome: Completed/Met Date Met:  10/14/14 Goal: Pain controlled with appropriate interventions Outcome: Completed/Met Date Met:  10/14/14 Goal: Dangle or out of bed evening of surgery Outcome: Progressing Goal: Initial discharge plan identified Outcome: Completed/Met Date Met:  10/14/14 Goal: Hemodynamically stable Outcome: Completed/Met Date Met:  10/14/14

## 2014-10-15 ENCOUNTER — Encounter (HOSPITAL_COMMUNITY): Payer: Self-pay | Admitting: Orthopedic Surgery

## 2014-10-15 LAB — BASIC METABOLIC PANEL
Anion gap: 10 (ref 5–15)
BUN: 11 mg/dL (ref 6–23)
CHLORIDE: 107 meq/L (ref 96–112)
CO2: 23 meq/L (ref 19–32)
Calcium: 8.4 mg/dL (ref 8.4–10.5)
Creatinine, Ser: 0.67 mg/dL (ref 0.50–1.35)
GFR calc Af Amer: >90 mL/min (ref >90)
GFR calc non Af Amer: 89 mL/min — ABNORMAL LOW (ref >90)
Glucose, Bld: 127 mg/dL — ABNORMAL HIGH (ref 70–99)
Potassium: 4.4 mEq/L (ref 3.7–5.3)
Sodium: 140 mEq/L (ref 137–147)

## 2014-10-15 LAB — CBC
HEMATOCRIT: 31.2 % — AB (ref 39.0–52.0)
HEMOGLOBIN: 10.3 g/dL — AB (ref 13.0–17.0)
MCH: 31.1 pg (ref 26.0–34.0)
MCHC: 33 g/dL (ref 30.0–36.0)
MCV: 94.3 fL (ref 78.0–100.0)
Platelets: 153 10*3/uL (ref 150–400)
RBC: 3.31 MIL/uL — AB (ref 4.22–5.81)
RDW: 17 % — ABNORMAL HIGH (ref 11.5–15.5)
WBC: 8.3 10*3/uL (ref 4.0–10.5)

## 2014-10-15 NOTE — Progress Notes (Signed)
[facility]  [facility] is providing the following services: RW and Commode  If patient discharges after hours, please call [phone].   Linward Headland 10/15/2014, 10:58 AM

## 2014-10-15 NOTE — Evaluation (Signed)
Physical Therapy Evaluation Patient Details Name: David Gutierrez MRN: [id number] DOB: Feb 27, 1933 Today's Date: 10/15/2014   History of Present Illness  L TKR  Clinical Impression  Pt s/p L TKR presents with decreased L LE strength/ROM and post op pain limiting functional mobility.  Pt should progress well to d.c home with family assist and HHPT follow up.    Follow Up Recommendations Home health PT    Equipment Recommendations  Rolling walker with 5" wheels    Recommendations for Other Services OT consult     Precautions / Restrictions Precautions Precautions: Knee;Fall Restrictions Weight Bearing Restrictions: No Other Position/Activity Restrictions: WBAT      Mobility  Bed Mobility Overal bed mobility: Needs Assistance Bed Mobility: Supine to Sit     Supine to sit: Min assist     General bed mobility comments: cues for sequence and use of R LE to self assist  Transfers Overall transfer level: Needs assistance Equipment used: Rolling walker (2 wheeled) Transfers: Sit to/from Stand Sit to Stand: Min assist         General transfer comment: cues for LE management and use of UEs to self assist  Ambulation/Gait Ambulation/Gait assistance: Min assist Ambulation Distance (Feet): 100 Feet Assistive device: Rolling walker (2 wheeled) Gait Pattern/deviations: Step-to pattern;Decreased step length - right;Decreased step length - left;Shuffle;Trunk flexed Gait velocity: decr   General Gait Details: cues for sequence, posture and position from [employer]            Wheelchair Mobility    Modified Rankin (Stroke Patients Only)       Balance                                             Pertinent Vitals/Pain Pain Assessment: 0-10 Pain Score: 5  Pain Location: L knee Pain Descriptors / Indicators: Aching;Sore Pain Intervention(s): Limited activity within patient's tolerance;Monitored during session;Premedicated before session;Ice  applied    Home Living Family/patient expects to be discharged to:: Private residence Living Arrangements: Spouse/significant other Available Help at Discharge: Family Type of Home: House Home Access: Stairs to enter Entrance Stairs-Rails: Right Entrance Stairs-Number of Steps: 4 Home Layout: Two level        Prior Function Level of Independence: Independent               Hand Dominance   Dominant Hand: Right    Extremity/Trunk Assessment   Upper Extremity Assessment: Overall WFL for tasks assessed           Lower Extremity Assessment: LLE deficits/detail   LLE Deficits / Details: 3/5 quads with AAROM at knee -10 - 50  Cervical / Trunk Assessment: Normal  Communication   Communication: No difficulties  Cognition Arousal/Alertness: Awake/alert Behavior During Therapy: WFL for tasks assessed/performed Overall Cognitive Status: Within Functional Limits for tasks assessed                      General Comments      Exercises Total Joint Exercises Ankle Circles/Pumps: AROM;Both;15 reps;Supine Quad Sets: AROM;Both;10 reps;Supine Heel Slides: AAROM;Left;10 reps;Supine Straight Leg Raises: AAROM;AROM;Left;15 reps;Supine      Assessment/Plan    PT Assessment Patient needs continued PT services  PT Diagnosis Difficulty walking   PT Problem List Decreased strength;Decreased range of motion;Decreased activity tolerance;Decreased mobility;Decreased knowledge of use of DME;Pain  PT Treatment Interventions DME instruction;Gait  training;Stair training;Functional mobility training;Therapeutic activities;Therapeutic exercise;Patient/family education   PT Goals (Current goals can be found in the Care Plan section) Acute Rehab PT Goals Patient Stated Goal: Resume previous lifestyle with decreased pain PT Goal Formulation: With patient Time For Goal Achievement: 10/22/14 Potential to Achieve Goals: Good    Frequency 7X/week   Barriers to discharge         Co-evaluation               End of Session Equipment Utilized During Treatment: Gait belt Activity Tolerance: Patient tolerated treatment well Patient left: in chair;with call bell/phone within reach Nurse Communication: Mobility status         Time: 6659-9357 PT Time Calculation (min) (ACUTE ONLY): 32 min   Charges:   PT Evaluation $Initial PT Evaluation Tier I: 1 Procedure PT Treatments $Gait Training: 8-22 mins $Therapeutic Exercise: 8-22 mins   PT G Codes:          David Gutierrez 10/15/2014, 11:52 AM

## 2014-10-15 NOTE — Care Management Note (Signed)
Page 1 of 2   10/15/2014     8:52:01 AM CARE MANAGEMENT NOTE 10/15/2014  Patient:  David Gutierrez,David Gutierrez   Account Number:  [id number]  Date Initiated:  10/15/2014  Documentation initiated by:  JEFFRIES,SARAH  Subjective/Objective Assessment:   adm: LEFT TOTAL KNEE ARTHROPLASTY (Left)     Action/Plan:   discharge planning   Anticipated DC Date:  10/15/2014   Anticipated DC Plan:  HOME W HOME HEALTH SERVICES      DC Planning Services  CM consult      PAC Choice  HOME HEALTH   Choice offered to / List presented to:  C-1 Patient   DME arranged  3-N-1  WALKER - ROLLING      DME agency  [facility]     HH arranged  HH-2 PT      HH agency  [facility]   Status of service:  Completed, signed off Medicare Important Message given?   (If response is "NO", the following Medicare IM given date fields will be blank) Date Medicare IM given:   Medicare IM given by:   Date Additional Medicare IM given:   Additional Medicare IM given by:    Discharge Disposition:  HOME W HOME HEALTH SERVICES  Per UR Regulation:    If discussed at Long Length of Stay Meetings, dates discussed:    Comments:  10/05/14 08:00 CM met with pt in room to offer choice of home health agency.  Pt chooses [facility] to render HHPT. Address and contact information verified with pt.  Wife to pick up and provide support.  CM texted [facility] DME to please deliver 3n1 and rolling walker to room prior to discharge. Referral texted to [facility] rep, Mary.  No other CM needs were communicated.  Sarah Jeffries, BSN, CM [phone].   

## 2014-10-15 NOTE — Progress Notes (Signed)
     Subjective: 1 Day Post-Op Procedure(s) (LRB): LEFT TOTAL KNEE ARTHROPLASTY (Left)   Patient reports pain as mild, pain controlled. No events throughout the night. Has been drinking a lot of fluid to help his kidneys / cystitis.  They did remove the foley, and he is a little nervous about getting up to have to use the bathroom.   Objective:   VITALS:   Filed Vitals:   10/15/14 0850  BP: 140/81  Pulse: 75  Temp: 98.3 F (36.8 C)  Resp: 16    Dorsiflexion/Plantar flexion intact Incision: dressing C/D/I No cellulitis present Compartment soft  LABS  Recent Labs  10/15/14 0402  HGB 10.3*  HCT 31.2*  WBC 8.3  PLT 153     Recent Labs  10/15/14 0402  NA 140  K 4.4  BUN 11  CREATININE 0.67  GLUCOSE 127*     Assessment/Plan: 1 Day Post-Op Procedure(s) (LRB): LEFT TOTAL KNEE ARTHROPLASTY (Left) Foley cath d/c'ed Advance diet Up with therapy D/C IV fluids Discharge home with home health eventually, when ready     West Pugh. Angel Weedon   PAC  10/15/2014, 9:44 AM  0

## 2014-10-15 NOTE — Progress Notes (Signed)
Physical Therapy Treatment Patient Details Name: David Gutierrez MRN: [id number] DOB: 1933-09-22 Today's Date: 11-10-14    History of Present Illness L TKR    PT Comments      Follow Up Recommendations  Home health PT     Equipment Recommendations  Rolling walker with 5" wheels    Recommendations for Other Services OT consult     Precautions / Restrictions Precautions Precautions: Knee;Fall Restrictions Weight Bearing Restrictions: No Other Position/Activity Restrictions: WBAT    Mobility  Bed Mobility Overal bed mobility: Needs Assistance Bed Mobility: Sit to Supine       Sit to supine: Min assist   General bed mobility comments: min assist for L  LE onto bed.  Transfers Overall transfer level: Needs assistance Equipment used: Rolling walker (2 wheeled) Transfers: Sit to/from Stand Sit to Stand: Min assist         General transfer comment: verbal cues for hand placement and LE management. Assist to correct for post lean on initial standing  Ambulation/Gait Ambulation/Gait assistance: Min assist;Min guard Ambulation Distance (Feet): 100 Feet (twice and 20' into bathroom) Assistive device: Rolling walker (2 wheeled) Gait Pattern/deviations: Step-to pattern;Decreased step length - right;Decreased step length - left;Shuffle;Trunk flexed Gait velocity: decr   General Gait Details: cues for sequence, posture and position from [employer]            Wheelchair Mobility    Modified Rankin (Stroke Patients Only)       Balance                                    Cognition Arousal/Alertness: Awake/alert Behavior During Therapy: WFL for tasks assessed/performed Overall Cognitive Status: Within Functional Limits for tasks assessed                      Exercises      General Comments        Pertinent Vitals/Pain Pain Assessment: 0-10 Pain Score: 4  Pain Location: L knee Pain Descriptors / Indicators: Sore Pain  Intervention(s): Limited activity within patient's tolerance;Monitored during session;Premedicated before session;Ice applied    Home Living Family/patient expects to be discharged to:: Private residence Living Arrangements: Spouse/significant other Available Help at Discharge: Family Type of Home: House Home Access: Stairs to enter Entrance Stairs-Rails: Right Home Layout: Two level Home Equipment: None      Prior Function Level of Independence: Independent          PT Goals (current goals can now be found in the care plan section) Acute Rehab PT Goals Patient Stated Goal: Resume previous lifestyle with decreased pain PT Goal Formulation: With patient Time For Goal Achievement: 10/22/14 Potential to Achieve Goals: Good Progress towards PT goals: Progressing toward goals    Frequency  7X/week    PT Plan Current plan remains appropriate    Co-evaluation             End of Session Equipment Utilized During Treatment: Gait belt Activity Tolerance: Patient tolerated treatment well Patient left: in bed;with call bell/phone within reach;with family/visitor present     Time: 1350-1418 PT Time Calculation (min) (ACUTE ONLY): 28 min  Charges:  $Gait Training: 23-37 mins                    G Codes:      Joclynn Lumb 11-10-14, 3:42 PM

## 2014-10-15 NOTE — Evaluation (Signed)
Occupational Therapy Evaluation Patient Details Name: David Gutierrez MRN: [id number] DOB: 1933/11/20 Today's Date: 10/15/2014    History of Present Illness L TKR   Clinical Impression   Pt up to the toilet to practice with 3in1 transfer. Also stood to use urinal. 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 Will further educate on AE and practice 3in1 transfer as goal for next session. Pt's wife present at end of session. Educated on use of ice frequently also.     Follow Up Recommendations  No OT follow up;Supervision/Assistance - 24 hour    Equipment Recommendations  3 in 1 bedside comode    Recommendations for Other Services       Precautions / Restrictions Precautions Precautions: Knee;Fall Restrictions Weight Bearing Restrictions: No Other Position/Activity Restrictions: WBAT      Mobility Bed Mobility Overal bed mobility: Needs Assistance Bed Mobility: Sit to Supine      Sit to supine: Min assist   General bed mobility comments: min assist for L  LE onto bed.  Transfers Overall transfer level: Needs assistance Equipment used: Rolling walker (2 wheeled) Transfers: Sit to/from Stand Sit to Stand: Min assist         General transfer comment: verbal cues for hand placement and LE management.    Balance                                            ADL Overall ADL's : Needs assistance/impaired Eating/Feeding: Independent;Sitting   Grooming: Wash/dry hands;Set up;Sitting   Upper Body Bathing: Set up;Sitting   Lower Body Bathing: Moderate assistance;Sit to/from stand   Upper Body Dressing : Set up;Sitting   Lower Body Dressing: Moderate assistance;Sit to/from stand   Toilet Transfer: Minimal assistance;RW;Ambulation;BSC   Toileting- Clothing Manipulation and Hygiene: Minimal assistance;Sit to/from stand         General ADL Comments: educated pt and wife on AE options for LB self care and pt is  considering purchasing. pt needs frequent verbal cues for keeping walker closer to him and for not picking walker up off the floor. Pt has a tub and plans to sponge bathe initially.      Vision                     Perception     Praxis      Pertinent Vitals/Pain Pain Assessment: 0-10 Pain Score: 4  Pain Location: L knee Pain Descriptors / Indicators: Aching Pain Intervention(s): Ice applied;Repositioned     Hand Dominance Right   Extremity/Trunk Assessment Upper Extremity Assessment Upper Extremity Assessment: Overall WFL for tasks assessed      Cervical / Trunk Assessment Cervical / Trunk Assessment: Normal   Communication Communication Communication: No difficulties   Cognition Arousal/Alertness: Awake/alert Behavior During Therapy: WFL for tasks assessed/performed Overall Cognitive Status: Within Functional Limits for tasks assessed                     General Comments       Exercises      Shoulder Instructions      Home Living Family/patient expects to be discharged to:: Private residence Living Arrangements: Spouse/significant other Available Help at Discharge: Family Type of Home: House Home Access: Stairs to enter Technical brewer of Steps: 4 Entrance Stairs-Rails: Right Home Layout: Two level Alternate Level  Stairs-Number of Steps: 14 Alternate Level Stairs-Rails: Right Bathroom Shower/Tub: Teacher, early years/pre: Standard     Home Equipment: None          Prior Functioning/Environment Level of Independence: Independent             OT Diagnosis: Generalized weakness   OT Problem List: Decreased strength;Decreased knowledge of use of DME or AE   OT Treatment/Interventions:      OT Goals(Current goals can be found in the care plan section) Acute Rehab OT Goals Patient Stated Goal: Resume previous lifestyle with decreased pain OT Goal Formulation: With patient Time For Goal Achievement:  10/22/14 Potential to Achieve Goals: Good  OT Frequency:     Barriers to D/C:            Co-evaluation              End of Session Equipment Utilized During Treatment: Gait belt;Rolling walker  Activity Tolerance: Patient tolerated treatment well Patient left: in bed;with call bell/phone within reach   Time: 1100-1144 OT Time Calculation (min): 44 min Charges:  OT General Charges $OT Visit: 1 Procedure OT Evaluation $Initial OT Evaluation Tier I: 1 Procedure OT Treatments $Self Care/Home Management : 8-22 mins $Therapeutic Activity: 23-37 mins G-Codes:    Jules Schick  [phone] 10/15/2014, 12:08 PM

## 2014-10-16 LAB — BASIC METABOLIC PANEL
Anion gap: 9 (ref 5–15)
BUN: 12 mg/dL (ref 6–23)
CO2: 26 mEq/L (ref 19–32)
Calcium: 8.9 mg/dL (ref 8.4–10.5)
Chloride: 103 mEq/L (ref 96–112)
Creatinine, Ser: 0.63 mg/dL (ref 0.50–1.35)
GFR calc Af Amer: >90 mL/min (ref >90)
GFR calc non Af Amer: >90 mL/min (ref >90)
Glucose, Bld: 107 mg/dL — ABNORMAL HIGH (ref 70–99)
Potassium: 4.1 mEq/L (ref 3.7–5.3)
Sodium: 138 mEq/L (ref 137–147)

## 2014-10-16 LAB — CBC
HEMATOCRIT: 32 % — AB (ref 39.0–52.0)
HEMOGLOBIN: 10.4 g/dL — AB (ref 13.0–17.0)
MCH: 31.1 pg (ref 26.0–34.0)
MCHC: 32.5 g/dL (ref 30.0–36.0)
MCV: 95.8 fL (ref 78.0–100.0)
Platelets: 152 10*3/uL (ref 150–400)
RBC: 3.34 MIL/uL — ABNORMAL LOW (ref 4.22–5.81)
RDW: 17.3 % — ABNORMAL HIGH (ref 11.5–15.5)
WBC: 6.5 10*3/uL (ref 4.0–10.5)

## 2014-10-16 MED ORDER — TIZANIDINE HCL 4 MG PO TABS: 4.0000 mg | ORAL_TABLET | Freq: Four times a day (QID) | ORAL | Status: DC | PRN

## 2014-10-16 MED ORDER — POLYETHYLENE GLYCOL 3350 17 G PO PACK: 17.0000 g | PACK | Freq: Two times a day (BID) | ORAL | Status: DC

## 2014-10-16 MED ORDER — HYDROCODONE-ACETAMINOPHEN 7.5-325 MG PO TABS: 1.0000 | ORAL_TABLET | ORAL | Status: DC | PRN

## 2014-10-16 MED ORDER — DSS 100 MG PO CAPS: 100.0000 mg | ORAL_CAPSULE | Freq: Two times a day (BID) | ORAL | Status: DC

## 2014-10-16 MED ORDER — ASPIRIN 325 MG PO TBEC: 325.0000 mg | DELAYED_RELEASE_TABLET | Freq: Every day | ORAL | Status: DC

## 2014-10-16 NOTE — Plan of Care (Signed)
Problem: Phase II Progression Outcomes Goal: Ambulates Outcome: Completed/Met Date Met:  10/16/14 Goal: Discharge plan established Outcome: Progressing

## 2014-10-16 NOTE — Addendum Note (Signed)
Addendum  created 10/16/14 2203 by Tiajuana Amass, MD   Modules edited: Anesthesia Attestations

## 2014-10-16 NOTE — Progress Notes (Signed)
Physical Therapy Treatment Patient Details Name: David Gutierrez MRN: [id number] DOB: 09-27-1933 Today's Date: 10/16/2014    History of Present Illness L TKR    PT Comments    Pt progressing well and eager for d/c home after completing full flight of stairs.  Follow Up Recommendations  Home health PT     Equipment Recommendations  Rolling walker with 5" wheels    Recommendations for Other Services OT consult     Precautions / Restrictions Precautions Precautions: Knee;Fall Restrictions Weight Bearing Restrictions: No Other Position/Activity Restrictions: WBAT    Mobility  Bed Mobility Overal bed mobility: Needs Assistance Bed Mobility: Supine to Sit     Supine to sit: Supervision        Transfers Overall transfer level: Needs assistance Equipment used: Rolling walker (2 wheeled) Transfers: Sit to/from Stand Sit to Stand: Min guard         General transfer comment: min guard to stand but needed  min assist to descend to chair safely. he did better with sitting on 3in1 with min guard assist.   Ambulation/Gait Ambulation/Gait assistance: Min guard;Supervision Ambulation Distance (Feet): 100 Feet (twice) Assistive device: Rolling walker (2 wheeled) Gait Pattern/deviations: Step-to pattern;Shuffle;Trunk flexed Gait velocity: decr   General Gait Details: cues for sequence, posture and position from RW   Stairs Stairs: Yes Stairs assistance: Min assist Stair Management: One rail Right;Step to pattern;Forwards;With crutches Number of Stairs: 10 General stair comments: cues for sequence and foot/crutch placement  Wheelchair Mobility    Modified Rankin (Stroke Patients Only)       Balance                                    Cognition Arousal/Alertness: Awake/alert Behavior During Therapy: WFL for tasks assessed/performed Overall Cognitive Status: Within Functional Limits for tasks assessed                      Exercises  Total Joint Exercises Ankle Circles/Pumps: AROM;Both;15 reps;Supine Quad Sets: AROM;Both;Supine;20 reps Heel Slides: AAROM;Left;Supine;20 reps Straight Leg Raises: AAROM;AROM;Left;Supine;20 reps Goniometric ROM: AAROM at L knee -10 - 70    General Comments        Pertinent Vitals/Pain Pain Assessment: 0-10 Pain Score: 4  Pain Location: L knee Pain Descriptors / Indicators: Sore Pain Intervention(s): Repositioned;Ice applied    Home Living Family/patient expects to be discharged to:: Private residence Living Arrangements: Spouse/significant other Available Help at Discharge: Family Type of Home: House Home Access: Stairs to enter Entrance Stairs-Rails: Right Home Layout: Two level Home Equipment: None      Prior Function Level of Independence: Independent          PT Goals (current goals can now be found in the care plan section) Acute Rehab PT Goals Patient Stated Goal: Resume previous lifestyle with decreased pain PT Goal Formulation: With patient Time For Goal Achievement: 10/22/14 Potential to Achieve Goals: Good Progress towards PT goals: Progressing toward goals    Frequency  7X/week    PT Plan Current plan remains appropriate    Co-evaluation             End of Session Equipment Utilized During Treatment: Gait belt Activity Tolerance: Patient tolerated treatment well Patient left: in chair;with call bell/phone within reach     Time: 7408-1448 PT Time Calculation (min) (ACUTE ONLY): 38 min  Charges:  $Gait Training: 8-22 mins $Therapeutic Exercise: 8-22 mins $  Therapeutic Activity: 8-22 mins                    G Codes:      Albie Bazin 10-30-2014, 12:13 PM

## 2014-10-16 NOTE — Progress Notes (Signed)
     Subjective: 2 Days Post-Op Procedure(s) (LRB): LEFT TOTAL KNEE ARTHROPLASTY (Left)   Seen by Dr. Alvan Dame. Patient reports pain as mild, pain controlled. No events throughout the night or day yesterday. Ready to be discharged home.  Objective:   VITALS:   Filed Vitals:   10/16/14 0530  BP: 138/65  Pulse: 73  Temp: 97.6 F (36.4 C)  Resp: 16    Dorsiflexion/Plantar flexion intact Incision: dressing C/D/I No cellulitis present Compartment soft  LABS  Recent Labs  10/15/14 0402 10/16/14 0418  HGB 10.3* 10.4*  HCT 31.2* 32.0*  WBC 8.3 6.5  PLT 153 152     Recent Labs  10/15/14 0402 10/16/14 0418  NA 140 138  K 4.4 4.1  BUN 11 12  CREATININE 0.67 0.63  GLUCOSE 127* 107*     Assessment/Plan: 2 Days Post-Op Procedure(s) (LRB): LEFT TOTAL KNEE ARTHROPLASTY (Left) Up with therapy Discharge home with home health  Follow up in 2 weeks at [facility]. Follow up with OLIN,Erum Cercone D in 2 weeks.  Contact information:  [facility] [address], [address] [place]   PAC  10/16/2014, 7:40 AM

## 2014-10-16 NOTE — Progress Notes (Addendum)
Occupational Therapy Treatment Patient Details Name: David Gutierrez MRN: [id number] DOB: 1933-04-02 Today's Date: 10/16/2014    History of present illness L TKR   OT comments  Pt doing well. Supposed to d/c today. Practiced with AE and toilet transfers. Pt ok to d/c from OT standpoint.  Follow Up Recommendations  No OT follow up;Supervision/Assistance - 24 hour    Equipment Recommendations  3 in 1 bedside comode    Recommendations for Other Services      Precautions / Restrictions Precautions Precautions: Knee;Fall Restrictions Weight Bearing Restrictions: No Other Position/Activity Restrictions: WBAT       Mobility Bed Mobility                  Transfers Overall transfer level: Needs assistance Equipment used: Rolling walker (2 wheeled) Transfers: Sit to/from Stand Sit to Stand: Min guard         General transfer comment: min guard to stand but needed  min assist to descend to chair safely. he did better with sitting on 3in1 with min guard assist.     Balance                                   ADL                       Lower Body Dressing: Minimal assistance;With adaptive equipment;Sit to/from stand Lower Body Dressing Details (indicate cue type and reason): min assist to help L shoe on due to some edema in L LE. Pt did well with AE. Toilet Transfer: Min guard;Ambulation;BSC;RW   Toileting- Water quality scientist and Hygiene: Min guard;Sit to/from stand         General ADL Comments: Educated pt on all AE pieces and pt used reacher to doff sock. he was able to thread on pants without AE. He used sock aid with min cues to don L sock for practice but then took off and wore TED hose in shoes. He needed min assist for L foot in shoe with shoe horn due to swelling. Educated on option for other shoes that are slip on and have a more rigid heel so the shoe horn will slide in the back and the shoe wont collapse in the back. Pt states he has  multiple shoes he can try out at home but verbalizes understanding of how to use AE. Discussed again recommendation for sponge bath initially and letting [facility] practice tub transfer (PT). He has a tubseat.      Vision                     Perception     Praxis      Cognition   Behavior During Therapy: WFL for tasks assessed/performed Overall Cognitive Status: Within Functional Limits for tasks assessed                       Extremity/Trunk Assessment               Exercises     Shoulder Instructions       General Comments      Pertinent Vitals/ Pain       Pain Assessment: 0-10 Pain Score: 4  Pain Location: L knee Pain Descriptors / Indicators: Sore Pain Intervention(s): Repositioned;Ice applied  Home Living Family/patient expects to be discharged to:: Private residence Living Arrangements: Spouse/significant other Available Help  at Discharge: Family Type of Home: House Home Access: Stairs to enter Technical brewer of Steps: 4 Entrance Stairs-Rails: Right Home Layout: Two level Alternate Level Stairs-Number of Steps: 14 Alternate Level Stairs-Rails: Right Bathroom Shower/Tub: Teacher, early years/pre: Standard     Home Equipment: None          Prior Functioning/Environment Level of Independence: Independent            Frequency Min 2X/week     Progress Toward Goals  OT Goals(current goals can now be found in the care plan section)  Progress towards OT goals: Progressing toward goals     Plan Discharge plan remains appropriate    Co-evaluation                 End of Session Equipment Utilized During Treatment: Rolling walker   Activity Tolerance Patient tolerated treatment well   Patient Left in chair;with call bell/phone within reach   Nurse Communication          Time: [phone] OT Time Calculation (min): 28 min  Charges: OT General Charges $OT Visit: 1 Procedure OT Treatments $Self  Care/Home Management : 8-22 mins $Therapeutic Activity: 8-22 mins  Jules Schick  [phone] 10/16/2014, 10:01 AM

## 2014-10-23 NOTE — Discharge Summary (Signed)
Physician Discharge Summary  Patient ID: David Gutierrez MRN: [id number] DOB/AGE: [birth year] 80 y.o.  Admit date: 10/14/2014 Discharge date: 10/16/2014   Procedures:  Procedure(s) (LRB): LEFT TOTAL KNEE ARTHROPLASTY (Left)  Attending Physician:  Dr. Paralee Cancel   Admission Diagnoses:   Left knee primary OA / pain  Discharge Diagnoses:  Principal Problem:   S/P left TKA Active Problems:   DJD (degenerative joint disease) of knee  Past Medical History  Diagnosis Date  . Hiatal hernia   . Esophageal reflux   . Unspecified gastritis and gastroduodenitis without mention of hemorrhage   . Diverticulosis of colon (without mention of hemorrhage)   . Personal history of colonic polyps 07/07/2003    adenomatous and hyperplastic   . Interstitial cystitis   . Irritable bowel syndrome   . Hypogonadism male   . LBBB (left bundle branch block)   . CAD (coronary artery disease)     a. Myoview 11/13:  EF 38%, inf and IS defect c/w scar but no ischemia:  b. Cardiac CT 11/13:  Ca score 318 Agatson units, pLAD and dCFX plaque;   c. LHC 11/07/12:  pLAD 30%, oD1 40%, oCFX 30%, dCFX 40-50%, mRCA 40%, EF 50% (frequent PVCs and short run of NSVT with injection)  . NICM (nonischemic cardiomyopathy)     ? 2/2 LBBB => a. echo 11/13: diff HK, worse in septum and apex, mod LVE, mild LVH, EF 40%, mild AI, mild MR, mod LAE  . Arthritis     HPI: David Gutierrez, 80 y.o. male, has a history of pain and functional disability in the left knee due to arthritis and has failed non-surgical conservative treatments for greater than 12 weeks to includeNSAID's and/or analgesics, corticosteriod injections, viscosupplementation injections and activity modification. Onset of symptoms was gradual, starting 6+ years ago with gradually worsening course since that time. The patient noted prior procedures on the knee to include arthroplasty on the right knee, per Dr. Unknown Foley. Patient currently rates pain in the left knee(s)  at 10 out of 10 with activity. Patient has worsening of pain with activity and weight bearing, pain that interferes with activities of daily living, pain with passive range of motion, crepitus and joint swelling. Patient has evidence of periarticular osteophytes and joint space narrowing by imaging studies. There is no active infection. Risks, benefits and expectations were discussed with the patient. Risks including but not limited to the risk of anesthesia, blood clots, nerve damage, blood vessel damage, failure of the prosthesis, infection and up to and including death. Patient understand the risks, benefits and expectations and wishes to proceed with surgery.  PCP: Donnajean Lopes, MD   Discharged Condition: good  Hospital Course:  Patient underwent the above stated procedure on 10/14/2014. Patient tolerated the procedure well and brought to the recovery room in good condition and subsequently to the floor.  POD #1 BP: 140/81 ; Pulse: 75 ; Temp: 98.3 F (36.8 C) ; Resp: 16 Patient reports pain as mild, pain controlled. No events throughout the night. Has been drinking a lot of fluid to help his kidneys / cystitis. They did remove the foley, and he is a little nervous about getting up to have to use the bathroom.  Dorsiflexion/plantar flexion intact, incision: dressing C/D/I, no cellulitis present and compartment soft.   LABS  Basename    HGB  10.3  HCT  31.2   POD #2  BP: 138/65 ; Pulse: 73 ; Temp: 97.6 F (36.4 C) ; Resp: 16  Patient reports pain as mild, pain controlled. No events throughout the night or day yesterday. Ready to be discharged home. Dorsiflexion/plantar flexion intact, incision: dressing C/D/I, no cellulitis present and compartment soft.   LABS  Basename    HGB  10.4  HCT  32.0     Discharge Exam: General appearance: alert, cooperative and no distress Extremities: Homans sign is negative, no sign of DVT, no edema, redness or tenderness in the calves or  thighs and no ulcers, gangrene or trophic changes  Disposition: Home with follow up in 2 weeks   Follow-up Information    Follow up with [facility].   Why:  home health physical therapy   Contact information:   [place] [address] [phone]       Follow up with [facility].   Why:  3n1 (commode) and rolling walker   Contact information:   [address] [place] [address] [phone]       Follow up with [name], MD. Schedule an appointment as soon as possible for a visit in 2 weeks.   Specialty:  Orthopedic Surgery   Contact information:   [address] [place] [address] [phone]       Discharge Instructions    Call MD / Call 911    Complete by:  As directed   If you experience chest pain or shortness of breath, CALL 911 and be transported to the hospital emergency room.  If you develope a fever above 101 F, pus (white drainage) or increased drainage or redness at the wound, or calf pain, call your surgeon's office.     Change dressing    Complete by:  As directed   Maintain surgical dressing for 10-14 days, or until follow up in the clinic.     Constipation Prevention    Complete by:  As directed   Drink plenty of fluids.  Prune juice may be helpful.  You may use a stool softener, such as Colace (over the counter) 100 mg twice a day.  Use MiraLax (over the counter) for constipation as needed.     Diet - low sodium heart healthy    Complete by:  As directed      Discharge instructions    Complete by:  As directed   Maintain surgical dressing for 10-14 days, or until follow up in the clinic. Follow up in 2 weeks at [facility]. Call with any questions or concerns.     Increase activity slowly as tolerated    Complete by:  As directed      TED hose    Complete by:  As directed   Use stockings (TED hose) for 2 weeks on both leg(s).  You may remove them at  night for sleeping.     Weight bearing as tolerated    Complete by:  As directed   Laterality:  left  Extremity:  Lower             Medication List    STOP taking these medications        diphenhydramine-acetaminophen 25-500 MG Tabs  Commonly known as:  TYLENOL PM      TAKE these medications        aspirin 325 MG EC tablet  Take 1 tablet (325 mg total) by mouth daily with breakfast.     atorvastatin 20 MG tablet  Commonly known as:  LIPITOR  Take 20  mg by mouth daily.     bethanechol 25 MG tablet  Commonly known as:  URECHOLINE  Take 25 mg by mouth 2 (two) times daily.     carvedilol 6.25 MG tablet  Commonly known as:  COREG  Take 6.25 mg by mouth 2 (two) times daily with a meal.     COLON CLEANSER PO  Take 3 capsules by mouth 2 (two) times daily.     diazepam 5 MG tablet  Commonly known as:  VALIUM  Take 2.5 mg by mouth 2 (two) times daily.     DSS 100 MG Caps  Take 100 mg by mouth 2 (two) times daily.     HYDROcodone-acetaminophen 7.5-325 MG per tablet  Commonly known as:  NORCO  Take 1-2 tablets by mouth every 4 (four) hours as needed for moderate pain.     polyethylene glycol packet  Commonly known as:  MIRALAX / GLYCOLAX  Take 17 g by mouth 2 (two) times daily.     tiZANidine 4 MG tablet  Commonly known as:  ZANAFLEX  Take 1 tablet (4 mg total) by mouth every 6 (six) hours as needed for muscle spasms.         Signed: [place]. Jailen Coward   PA-C  10/23/2014, 1:33 PM

## 2014-11-01 ENCOUNTER — Other Ambulatory Visit: Payer: Self-pay | Admitting: Cardiology

## 2014-11-05 ENCOUNTER — Ambulatory Visit (INDEPENDENT_AMBULATORY_CARE_PROVIDER_SITE_OTHER): Payer: Medicare HMO | Admitting: Cardiology

## 2014-11-05 ENCOUNTER — Encounter: Payer: Self-pay | Admitting: *Deleted

## 2014-11-05 VITALS — BP 112/70 | HR 77 | Ht 69.0 in | Wt 217.0 lb

## 2014-11-05 DIAGNOSIS — E785 Hyperlipidemia, unspecified: Secondary | ICD-10-CM

## 2014-11-05 DIAGNOSIS — I2510 Atherosclerotic heart disease of native coronary artery without angina pectoris: Secondary | ICD-10-CM

## 2014-11-05 DIAGNOSIS — I428 Other cardiomyopathies: Secondary | ICD-10-CM

## 2014-11-05 DIAGNOSIS — I447 Left bundle-branch block, unspecified: Secondary | ICD-10-CM

## 2014-11-05 DIAGNOSIS — I429 Cardiomyopathy, unspecified: Secondary | ICD-10-CM

## 2014-11-05 MED ORDER — CARVEDILOL 6.25 MG PO TABS: ORAL_TABLET | ORAL | Status: DC

## 2014-11-05 NOTE — Patient Instructions (Signed)
Increase coreg (carvedilol) to 9.375mg  two times a day. This will be 1 and 1/2 of a 6.25mg  tablet two times a day.  Your physician recommends that you return for lab work today--Lipid profile.  Your physician has requested that you have an echocardiogram. Echocardiography is a painless test that uses sound waves to create images of your heart. It provides your doctor with information about the size and shape of your heart and how well your heart's chambers and valves are working. This procedure takes approximately one hour. There are no restrictions for this procedure. In the next week or so.   Your physician wants you to follow-up in: 6 months with Dr [name]. (June 2016). You will receive a reminder letter in the mail two months in advance. If you don't receive a letter, please call our office to schedule the follow-up appointment.

## 2014-11-06 ENCOUNTER — Encounter: Payer: Self-pay | Admitting: Cardiology

## 2014-11-06 LAB — LIPID PANEL
Cholesterol: 125 mg/dL (ref 0–200)
HDL: 38.8 mg/dL — ABNORMAL LOW (ref >39.00)
LDL Cholesterol: 56 mg/dL (ref 0–99)
NonHDL: 86.2
Total CHOL/HDL Ratio: 3
Triglycerides: 149 mg/dL (ref 0.0–149.0)
VLDL: 29.8 mg/dL (ref 0.0–40.0)

## 2014-11-06 NOTE — Progress Notes (Signed)
Patient ID: David Gutierrez, male   DOB: 11/06/1933, 81 y.o.   MRN: [id number] PCP: Dr. Philip Aspen  I saw David Gutierrez initially on 10/09/12 for atypical chest pain. He was noted to have a left bundle branch block on his ECG which may have been chronic (no prior ECGs available to me but he mentioned being told about an ECG abnormality in the past). He was set up for Myoview. This demonstrated an inferior and inferoseptal defect consistent with scar but no ischemia with an EF of 30%. Echocardiogram also demonstrated LV dysfunction with an EF of 40%.  Cardiac CT 10/19/12 did demonstrated calcium score of 318 with proximal LAD and distal circumflex plaque with potentially moderate stenosis. He was ultimately set up for cardiac catheterization. LHC on 11/07/12 demonstrated diffuse moderate nonobstructive CAD (proximal LAD, D1, and distal LCx). Patient was started on carvedilol and lisinopril as well as aspirin and atorvastatin.   Last echo in 12/14 showed EF 40-45% with mild LV dilation and diffuse hypokinesis. He had to stop lisinopril and later losartan because both medications seemed to be interacting with his interstitial cystitis and causing urinary retention.  Since last appointment, he had uncomplicated left TKR in 30/86.   He has had no further chest pain.  No exertional dyspnea.  He is rehabbing his left knee.  He has restarted walking his dog.  He and his wife will be moving to [place] soon. Weight is down 3 lbs.    Labs (11/13): K 4.2, creatinine 0.8  Labs (12/13): K 4.3, creatinine 0.7, BNP 37, Hgb 12.8 Labs (2/14): LDL 52, HDL 55, K 4.8, creatinine 0.8 Labs (9/14): K 4.7, creatinine 0.7, LDL 66, HDL 51 Labs (11/15): K 4.1, creatinine 0.63, HCT 31.2  PMH: 1. Low testosterone 2. Interstitial cystitis 3. LBBB: Chronic  4. CAD:  ETT-Myoview (11/13) with inferior and inferoseptal scar and EF 30%.  Coronary CTA 11/13 with coronary calcium score 318 Agatston units and proximal LAD/distal LCx plaque with up  to moderate stenosis.  LHC (12/13) with 30% proximal LAD, 40% ostial D1, 40-50% diffuse stenosis in the distal LCx.   5. Nonischemic cardiomyopathy: EF 30% by Myoview.  Echo (11/13) with EF 40%, diffuse hypokinesis worst at the apex.  Nonobstructive CAD on cath.  Possible LBBB cardiomyopathy.  Echo (12/14) with EF 40-45%, mild LV dilation, diffuse hypokinesis, mild AI, mildly dilated RV, ascending aorta 4.0 cm.  He has been unable to take ACEI or ARB due to urinary retention.   SH: Lives in [place], retired, quit smoking 20+ years ago.   FH: Brother with CHF, died at 52  ROS: All systems reviewed and negative except as per HPI.    Current Outpatient Prescriptions  Medication Sig Dispense Refill  . atorvastatin (LIPITOR) 20 MG tablet Take 20 mg by mouth daily.    . bethanechol (URECHOLINE) 25 MG tablet Take 25 mg by mouth 2 (two) times daily.    . diazepam (VALIUM) 5 MG tablet Take 2.5 mg by mouth 2 (two) times daily.     . Misc Natural Products (COLON CLEANSER PO) Take 3 capsules by mouth 2 (two) times daily.      . polyethylene glycol (MIRALAX / GLYCOLAX) packet Take 17 g by mouth 2 (two) times daily. 14 each 0  . carvedilol (COREG) 6.25 MG tablet 1 and 1/2 tablets (9.375mg ) two times a day 90 tablet 6   No current facility-administered medications for this visit.    BP 112/70 mmHg  Pulse 77  Ht 5\' 9"  (1.753 m)  Wt 217 lb (98.431 kg)  BMI 32.03 kg/m2 General: NAD Neck: No JVD, no thyromegaly or thyroid nodule.  Lungs: Clear to auscultation bilaterally with normal respiratory effort. CV: Nondisplaced PMI.  Heart regular S1/S2, paradoxical S2 split, no S3/S4, no murmur.  No peripheral edema.  No carotid bruit.  Normal pedal pulses.  Abdomen: Soft, nontender, no hepatosplenomegaly, no distention.   Neurologic: Alert and oriented x 3.  Psych: Normal affect. Extremities: No clubbing or cyanosis.   Assessment/Plan: 1. CAD: Nonobstructive CAD on cath in 12/13.  I do not think that  it plays a role in his depressed EF.  Continue ASA 81 and statin.  2. Nonischemic cardiomyopathy: It is possible that this is a LBBB cardiomyopathy.  EF 40-45% on last echo which is stable.  NYHA class I-II.  He is not volume overloaded on exam.  It does not look like he will be able to take ACEI or ARB due to urinary retention.  Therefore, I will increase Coreg to 9.375 mg bid.  Repeat echo to reassess EF.  3. Hyperlipidemia: Continue statin given known CAD.  Check lipids today, goal LDL < 70.   Followup in 6 months.   Loralie Champagne 11/06/2014

## 2014-11-12 ENCOUNTER — Ambulatory Visit (HOSPITAL_COMMUNITY): Payer: Medicare HMO | Attending: Cardiology | Admitting: Radiology

## 2014-11-12 DIAGNOSIS — E785 Hyperlipidemia, unspecified: Secondary | ICD-10-CM | POA: Diagnosis not present

## 2014-11-12 DIAGNOSIS — I2510 Atherosclerotic heart disease of native coronary artery without angina pectoris: Secondary | ICD-10-CM | POA: Insufficient documentation

## 2014-11-12 DIAGNOSIS — I429 Cardiomyopathy, unspecified: Secondary | ICD-10-CM

## 2014-11-12 DIAGNOSIS — I447 Left bundle-branch block, unspecified: Secondary | ICD-10-CM

## 2014-11-12 NOTE — Progress Notes (Signed)
Echocardiogram performed.  

## 2014-11-26 ENCOUNTER — Other Ambulatory Visit (HOSPITAL_COMMUNITY): Payer: Self-pay | Admitting: Orthopedic Surgery

## 2014-11-26 ENCOUNTER — Ambulatory Visit (HOSPITAL_COMMUNITY)
Admission: RE | Admit: 2014-11-26 | Discharge: 2014-11-26 | Disposition: A | Discharge status: Home / Self Care | Payer: Medicare HMO | Source: Ambulatory Visit | Attending: Cardiology | Admitting: Cardiology

## 2014-11-26 DIAGNOSIS — Z96652 Presence of left artificial knee joint: Secondary | ICD-10-CM | POA: Insufficient documentation

## 2014-11-26 DIAGNOSIS — M9712XA Periprosthetic fracture around internal prosthetic left knee joint, initial encounter: Secondary | ICD-10-CM

## 2014-11-26 DIAGNOSIS — M7989 Other specified soft tissue disorders: Secondary | ICD-10-CM

## 2014-11-26 NOTE — Progress Notes (Signed)
Left Lower Extremity Venous Duplex Completed. No evidence for DVT or SVT. °Brianna L Mazza,RVT °

## 2014-12-05 HISTORY — PX: COLONOSCOPY: SHX174

## 2015-02-04 ENCOUNTER — Other Ambulatory Visit: Payer: Self-pay | Admitting: Cardiology

## 2015-04-13 ENCOUNTER — Encounter: Payer: Self-pay | Admitting: Cardiology

## 2015-04-13 ENCOUNTER — Ambulatory Visit (INDEPENDENT_AMBULATORY_CARE_PROVIDER_SITE_OTHER): Payer: Medicare HMO | Admitting: Cardiology

## 2015-04-13 VITALS — BP 120/80 | HR 70 | Ht 69.0 in | Wt 210.0 lb

## 2015-04-13 DIAGNOSIS — E785 Hyperlipidemia, unspecified: Secondary | ICD-10-CM

## 2015-04-13 DIAGNOSIS — I429 Cardiomyopathy, unspecified: Secondary | ICD-10-CM

## 2015-04-13 DIAGNOSIS — I2510 Atherosclerotic heart disease of native coronary artery without angina pectoris: Secondary | ICD-10-CM

## 2015-04-13 DIAGNOSIS — I428 Other cardiomyopathies: Secondary | ICD-10-CM

## 2015-04-13 NOTE — Patient Instructions (Signed)
Medication Instructions:  Your physician recommends that you continue on your current medications as directed. Please refer to the Current Medication list given to you today.  Labwork: No new orders.   Testing/Procedures: No new orders.  Follow-Up: Your physician wants you to follow-up in: 6 MONTHS with Dr [name].  You will receive a reminder letter in the mail two months in advance. If you don't receive a letter, please call our office to schedule the follow-up appointment.  Any Other Special Instructions Will Be Listed Below (If Applicable).

## 2015-04-14 NOTE — Progress Notes (Signed)
Patient ID: David Gutierrez, male   DOB: 04/11/1933, 81 y.o.   MRN: [id number] PCP: Dr. Philip Aspen  I saw David Gutierrez initially on 10/09/12 for atypical chest pain. He was noted to have a left bundle branch block on his ECG which may have been chronic (no prior ECGs available to me but he mentioned being told about an ECG abnormality in the past). He was set up for Myoview. This demonstrated an inferior and inferoseptal defect consistent with scar but no ischemia with an EF of 30%. Echocardiogram also demonstrated LV dysfunction with an EF of 40%.  Cardiac CT 10/19/12 did demonstrated calcium score of 318 with proximal LAD and distal circumflex plaque with potentially moderate stenosis. He was ultimately set up for cardiac catheterization. LHC on 11/07/12 demonstrated diffuse moderate nonobstructive CAD (proximal LAD, D1, and distal LCx). Patient was started on carvedilol and lisinopril as well as aspirin and atorvastatin.   Echo in 12/14 showed EF 40-45% with mild LV dilation and diffuse hypokinesis. He had to stop lisinopril and later losartan because both medications seemed to be interacting with his interstitial cystitis and causing urinary retention.  He had uncomplicated left TKR in 69/79. Last echo in 12/15 showed EF 55-60%, mild biatrial enlargement, RV-RA gradient 33 mmHg.   He has had no chest pain.  No exertional dyspnea.  He is doing well, walking more since his knee replacement.  He took meloxicam for a while and developed significant ankle edema so stopped it (edema resolved).   Labs (11/13): K 4.2, creatinine 0.8  Labs (12/13): K 4.3, creatinine 0.7, BNP 37, Hgb 12.8 Labs (2/14): LDL 52, HDL 55, K 4.8, creatinine 0.8 Labs (9/14): K 4.7, creatinine 0.7, LDL 66, HDL 51 Labs (11/15): K 4.1, creatinine 0.63, HCT 31.2 Labs (12/15): LDL 56, HDL 39  ECG: NSR, 1st degree AV block, LBBB  PMH: 1. Low testosterone 2. Interstitial cystitis 3. LBBB: Chronic  4. CAD:  ETT-Myoview (11/13) with inferior and  inferoseptal scar and EF 30%.  Coronary CTA 11/13 with coronary calcium score 318 Agatston units and proximal LAD/distal LCx plaque with up to moderate stenosis.  LHC (12/13) with 30% proximal LAD, 40% ostial D1, 40-50% diffuse stenosis in the distal LCx.   5. Nonischemic cardiomyopathy: EF 30% by Myoview.  Echo (11/13) with EF 40%, diffuse hypokinesis worst at the apex.  Nonobstructive CAD on cath.  Possible LBBB cardiomyopathy.  Echo (12/14) with EF 40-45%, mild LV dilation, diffuse hypokinesis, mild AI, mildly dilated RV, ascending aorta 4.0 cm.  He has been unable to take ACEI or ARB due to urinary retention.  Echo (12/15) with EF 55-60%, mild biatrial enlargement, RV-RA gradient 33 mmHg.   SH: Lives in [place], retired, quit smoking 20+ years ago.   FH: Brother with CHF, died at 45  ROS: All systems reviewed and negative except as per HPI.    Current Outpatient Prescriptions  Medication Sig Dispense Refill  . aspirin 81 MG tablet Take 81 mg by mouth daily.    Marland Kitchen atorvastatin (LIPITOR) 20 MG tablet TAKE ONE TABLET BY MOUTH ONCE DAILY 90 tablet 1  . bethanechol (URECHOLINE) 25 MG tablet Take 25 mg by mouth 2 (two) times daily.    . carvedilol (COREG) 6.25 MG tablet 1 and 1/2 tablets (9.375mg ) two times a day 90 tablet 6  . diazepam (VALIUM) 5 MG tablet Take 2.5 mg by mouth 2 (two) times daily.     . Misc Natural Products (COLON CLEANSER PO) Take 3 capsules  by mouth 2 (two) times daily.      . polyethylene glycol (MIRALAX / GLYCOLAX) packet Take 17 g by mouth 2 (two) times daily. 14 each 0   No current facility-administered medications for this visit.    BP 120/80 mmHg  Pulse 70  Ht 5\' 9"  (1.753 m)  Wt 210 lb (95.255 kg)  BMI 31.00 kg/m2 General: NAD Neck: No JVD, no thyromegaly or thyroid nodule.  Lungs: Clear to auscultation bilaterally with normal respiratory effort. CV: Nondisplaced PMI.  Heart regular S1/S2, paradoxical S2 split, no S3/S4, no murmur.  1+ left ankle edema.  No  carotid bruit.  Normal pedal pulses.  Abdomen: Soft, nontender, no hepatosplenomegaly, no distention.   Neurologic: Alert and oriented x 3.  Psych: Normal affect. Extremities: No clubbing or cyanosis.   Assessment/Plan: 1. CAD: Nonobstructive CAD on cath in 12/13.  I do not think that it played a role in his depressed EF.  Continue ASA 81 and statin.  2. Nonischemic cardiomyopathy: It is possible that this is a LBBB cardiomyopathy.  Most recent echo showed EF improved to 55-60%.  NYHA class I-II.  He is not volume overloaded on exam.  It does not look like he will be able to take ACEI or ARB due to urinary retention.  Continue Coreg 9.375 mg bid.  3. Hyperlipidemia: Continue statin given known CAD. Good lipids in 12/15.   Followup in 6 months.   Loralie Champagne 04/14/2015

## 2015-06-17 ENCOUNTER — Telehealth: Payer: Self-pay

## 2015-06-17 NOTE — Telephone Encounter (Signed)
Pt scheduled to see Dr. Henrene Pastor 08/05/15@9 :15am. Left message for pt to call back.

## 2015-06-17 NOTE — Telephone Encounter (Signed)
Spoke with pt and he is aware of appt 

## 2015-06-17 NOTE — Telephone Encounter (Signed)
-----   Message from Irene Shipper, MD sent at 06/16/2015  5:02 PM EDT ----- Regarding: Patient appointment This is a former Dr. Sharlett Iles patient. I take care of his wife. A friend of Dr. Velora Heckler. Anyway, Dr. Velora Heckler called me earlier asking me to see this gentleman for dysphagia and fecal incontinence. Please call him and make him a routine office appointment. Thank you

## 2015-07-18 ENCOUNTER — Other Ambulatory Visit: Payer: Self-pay | Admitting: Cardiology

## 2015-07-27 ENCOUNTER — Other Ambulatory Visit: Payer: Self-pay | Admitting: Cardiology

## 2015-08-05 ENCOUNTER — Encounter: Payer: Self-pay | Admitting: Internal Medicine

## 2015-08-05 ENCOUNTER — Ambulatory Visit (INDEPENDENT_AMBULATORY_CARE_PROVIDER_SITE_OTHER): Payer: Medicare HMO | Admitting: Internal Medicine

## 2015-08-05 VITALS — BP 110/78 | HR 64 | Ht 71.0 in | Wt 192.0 lb

## 2015-08-05 DIAGNOSIS — K2100 Gastro-esophageal reflux disease with esophagitis, without bleeding: Secondary | ICD-10-CM

## 2015-08-05 DIAGNOSIS — Z86010 Personal history of colonic polyps: Secondary | ICD-10-CM | POA: Diagnosis not present

## 2015-08-05 DIAGNOSIS — R1310 Dysphagia, unspecified: Secondary | ICD-10-CM

## 2015-08-05 DIAGNOSIS — K449 Diaphragmatic hernia without obstruction or gangrene: Secondary | ICD-10-CM | POA: Diagnosis not present

## 2015-08-05 NOTE — Patient Instructions (Signed)
You have been scheduled for an endoscopy. Please follow written instructions given to you at your visit today. If you use inhalers (even only as needed), please bring them with you on the day of your procedure.   

## 2015-08-05 NOTE — Progress Notes (Signed)
HISTORY OF PRESENT ILLNESS:  David Gutierrez is a 81 y.o. male with multiple medical problems as listed below. He presents today as a new patient to myself regarding intermittent solid food dysphagia over proximally 8 months duration. Previous patient of Dr. Verl Blalock. The patient has not been seen since 2012 when he underwent complete colonoscopy for history of colon polyps. He was found to have hemorrhoids and diminutive adenomatous. Follow-up in 5 years recommended. Patient denies significant weight loss, but has lost a few pounds since moving to a retirement community. Does have occasional reflux symptoms but is on no antireflux medication. His last upper endoscopy was performed November 2007. At that time he was found to have a 5 cm hiatal hernia. He complained of dysphagia at that time and underwent esophageal dilation with 58 French Maloney dilator. Apparently no issues until recently. He did undergo esophageal biopsies revealed reflux but no Barrett's. GI review of systems is otherwise negative  REVIEW OF SYSTEMS:  All non-GI ROS negative upon comprehensive review of all systems  Past Medical History  Diagnosis Date  . Hiatal hernia   . Esophageal reflux   . Unspecified gastritis and gastroduodenitis without mention of hemorrhage   . Diverticulosis of colon (without mention of hemorrhage)   . Personal history of colonic polyps 07/07/2003    adenomatous and hyperplastic   . Interstitial cystitis   . Irritable bowel syndrome   . Hypogonadism male   . LBBB (left bundle branch block)   . CAD (coronary artery disease)     a. Myoview 11/13:  EF 38%, inf and IS defect c/w scar but no ischemia:  b. Cardiac CT 11/13:  Ca score 318 Agatson units, pLAD and dCFX plaque;   c. LHC 11/07/12:  pLAD 30%, oD1 40%, oCFX 30%, dCFX 40-50%, mRCA 40%, EF 50% (frequent PVCs and short run of NSVT with injection)  . NICM (nonischemic cardiomyopathy)     ? 2/2 LBBB => a. echo 11/13: diff HK, worse in septum  and apex, mod LVE, mild LVH, EF 40%, mild AI, mild MR, mod LAE  . Arthritis   . External hemorrhoids   . Colon polyp     adenomatous    Past Surgical History  Procedure Laterality Date  . Lumbar laminectomy  1952  . Replacement total knee      right  . Tonsillectomy and adenoidectomy    . Coronary angioplasty    . Transurethral resection of prostate    . Total knee arthroplasty Left 10/14/2014    Procedure: LEFT TOTAL KNEE ARTHROPLASTY;  Surgeon: [name], MD;  Location: [facility];  Service: Orthopedics;  Laterality: Left;    Social History RAJ [name]  reports that he has quit smoking. He has never used smokeless tobacco. He reports that he drinks alcohol. He reports that he does not use illicit drugs.  family history includes Diabetes type I in his son; Heart disease in his brother. There is no history of Colon cancer.  Allergies  Allergen Reactions  . Cyclobenzaprine     Lost balance  . Lisinopril     Affected ability to urinate  . Relafen [Nabumetone]     Problems urinating afterwards        PHYSICAL EXAMINATION: Vital signs: BP 110/78 mmHg  Pulse 64  Ht 5\' 11"  (1.803 m)  Wt 192 lb (87.091 kg)  BMI 26.79 kg/m2 General: Well-developed, well-nourished, no acute distress HEENT: Sclerae are anicteric, conjunctiva pink. Oral mucosa intact Lymphadenopathy: No  lymphadenopathy in the supraclavicular region Lungs: Clear Heart: Regular Abdomen: soft, nontender, nondistended, no obvious ascites, no peritoneal signs, normal bowel sounds. No organomegaly. Extremities: No clubbing cyanosis or edema Psychiatric: alert and oriented x3. Cooperative   ASSESSMENT:  #1. 8 month history of intermittent solid food dysphagia. Suspect peptic stricture or Schatzki's ring associated with hiatal hernia. Previous dilation 2007 with apparent success #2. GERD symptoms. Previous endoscopy with esophagitis. May need PPI #3. History of adenomatous colon polyps per last colonoscopy  2012. Follow-up in 5 years was recommended. Currently without lower GI symptoms #4. Multiple medical problems. Stable   PLAN:  #1. Upper endoscopy with esophageal dilation. The patient is high-risk given his age and cardiovascular history.The nature of the procedure, as well as the risks, benefits, and alternatives were carefully and thoroughly reviewed with the patient. Ample time for discussion and questions allowed. The patient understood, was satisfied, and agreed to proceed. #2. Reflux precautions #3. May need PPI prescribed #4. Surveillance colonoscopy next year if medically fit

## 2015-08-11 ENCOUNTER — Encounter: Payer: Self-pay | Admitting: Internal Medicine

## 2015-08-11 ENCOUNTER — Ambulatory Visit (AMBULATORY_SURGERY_CENTER): Payer: Medicare HMO | Admitting: Internal Medicine

## 2015-08-11 VITALS — BP 133/90 | HR 62 | Temp 97.4°F | Resp 18 | Ht 71.0 in | Wt 192.0 lb

## 2015-08-11 DIAGNOSIS — K2100 Gastro-esophageal reflux disease with esophagitis, without bleeding: Secondary | ICD-10-CM

## 2015-08-11 DIAGNOSIS — R1310 Dysphagia, unspecified: Secondary | ICD-10-CM

## 2015-08-11 DIAGNOSIS — Z8719 Personal history of other diseases of the digestive system: Secondary | ICD-10-CM

## 2015-08-11 DIAGNOSIS — K253 Acute gastric ulcer without hemorrhage or perforation: Secondary | ICD-10-CM

## 2015-08-11 DIAGNOSIS — K222 Esophageal obstruction: Secondary | ICD-10-CM

## 2015-08-11 HISTORY — DX: Personal history of other diseases of the digestive system: Z87.19

## 2015-08-11 MED ORDER — SODIUM CHLORIDE 0.9 % IV SOLN: 500.0000 mL | INTRAVENOUS | Status: DC

## 2015-08-11 MED ORDER — OMEPRAZOLE 20 MG PO CPDR: 20.0000 mg | DELAYED_RELEASE_CAPSULE | Freq: Every day | ORAL | Status: DC

## 2015-08-11 NOTE — Progress Notes (Signed)
To recovery, report to Mirts, RN, VSS. 

## 2015-08-11 NOTE — Progress Notes (Signed)
Called to room to assist during endoscopic procedure.  Patient ID and intended procedure confirmed with present staff. Received instructions for my participation in the procedure from the performing physician.  

## 2015-08-11 NOTE — Op Note (Signed)
[facility]  [address] [place], [id number]   ENDOSCOPY PROCEDURE REPORT  PATIENT: David Gutierrez, David Gutierrez  MR#: [id number] BIRTHDATE: 11/27/1933 , 82  yrs. old GENDER: male ENDOSCOPIST: [place] Quail, MD REFERRED BY:  .  Self / Office PROCEDURE DATE:  08/11/2015 PROCEDURE:  EGD w/ biopsy and Maloney dilation of esophagus  - 62F ASA CLASS:     Class III INDICATIONS:  dysphagia. MEDICATIONS: Monitored anesthesia care and Propofol 120 mg IV TOPICAL ANESTHETIC: none  DESCRIPTION OF PROCEDURE: After the risks benefits and alternatives of the procedure were thoroughly explained, informed consent was obtained.  The LB [id number] endoscope was introduced through the mouth and advanced to the second portion of the duodenum , Without limitations.  The instrument was slowly withdrawn as the mucosa was fully examined.  EXAM:Esophagus revealed a 15 mm ringlike stricture at the gastroesophageal junction.  There was associated reflux esophagitis.  No Barrett's.  Stomach revealed multiple antral erosions but was otherwise normal.  CLO biopsies taken.  Duodenum was normal.  Retroflexed views revealed a hiatal hernia.     The scope was then withdrawn from the patient and the procedure completed. THERAPY: 54 French Maloney dilator was passed into the esophagus without resistance or heme. Tolerated well  COMPLICATIONS: There were no immediate complications.  ENDOSCOPIC IMPRESSION: 1. GERD with esophageal stricture and esophagitis, status post dilation 70 French 2. Gastric erosions  RECOMMENDATIONS: 1.  Clear liquids until 10 AM , then soft foods rest of day.  Resume prior diet tomorrow. 2.  Prescribed omeprazole 20 mg daily; #30; 11 refills 3.  Rx CLO if positive 4.  Call office next 2-3 days to schedule a follow-up office appointment with Dr. Henrene Pastor in 8-12 weeks  REPEAT EXAM:  eSigned:  [place] Quail, MD 08/11/2015 7:53 AM    CC:The Patient and Leanna Battles, MD

## 2015-08-11 NOTE — Patient Instructions (Signed)
YOU HAD AN ENDOSCOPIC PROCEDURE TODAY AT [facility]:   Refer to the procedure report that was given to you for any specific questions about what was found during the examination.  If the procedure report does not answer your questions, please call your gastroenterologist to clarify.  If you requested that your care partner not be given the details of your procedure findings, then the procedure report has been included in a sealed envelope for you to review at your convenience later.  YOU SHOULD EXPECT: Some feelings of bloating in the abdomen. Passage of more gas than usual.  Walking can help get rid of the air that was put into your GI tract during the procedure and reduce the bloating. If you had a lower endoscopy (such as a colonoscopy or flexible sigmoidoscopy) you may notice spotting of blood in your stool or on the toilet paper. If you underwent a bowel prep for your procedure, you may not have a normal bowel movement for a few days.  Please Note:  You might notice some irritation and congestion in your nose or some drainage.  This is from the oxygen used during your procedure.  There is no need for concern and it should clear up in a day or so.  SYMPTOMS TO REPORT IMMEDIATELY:   Following upper endoscopy (EGD)  Vomiting of blood or coffee ground material  New chest pain or pain under the shoulder blades  Painful or persistently difficult swallowing  New shortness of breath  Fever of 100F or higher  Black, tarry-looking stools  For urgent or emergent issues, a gastroenterologist can be reached at any hour by calling [phone].   DIET: See dilation diet-  Likewise, meals heavy in dairy and vegetables can increase bloating.  Drink plenty of fluids but you should avoid alcoholic beverages for 24 hours.  ACTIVITY:  You should plan to take it easy for the rest of today and you should NOT DRIVE or use heavy machinery until tomorrow (because of the sedation medicines used  during the test).    FOLLOW UP: Our staff will call the number listed on your records the next business day following your procedure to check on you and address any questions or concerns that you may have regarding the information given to you following your procedure. If we do not reach you, we will leave a message.  However, if you are feeling well and you are not experiencing any problems, there is no need to return our call.  We will assume that you have returned to your regular daily activities without incident.  If any biopsies were taken you will be contacted by phone or by letter within the next 1-3 weeks.  Please call us at [phone] if you have not heard about the biopsies in 3 weeks.    SIGNATURES/CONFIDENTIALITY: You and/or your care partner have signed paperwork which will be entered into your electronic medical record.  These signatures attest to the fact that that the information above on your After Visit Summary has been reviewed and is understood.  Full responsibility of the confidentiality of this discharge information lies with you and/or your care-partner.  Dilation diet, stricture-handouts given  Call office in the next 2-3 days to schedule follow-up office appointment with Dr. Henrene Pastor in 8-12 weeks.

## 2015-08-12 ENCOUNTER — Telehealth: Payer: Self-pay

## 2015-08-12 NOTE — Telephone Encounter (Signed)
  Follow up Call-  Call back number 08/11/2015  Post procedure Call Back phone  # [phone]  Permission to leave phone message Yes     Patient questions:  Do you have a fever, pain , or abdominal swelling? No. Pain Score  0 *  Have you tolerated food without any problems? Yes.    Have you been able to return to your normal activities? Yes.    Do you have any questions about your discharge instructions: Diet   No. Medications  No. Follow up visit  No.  Do you have questions or concerns about your Care? No.  Actions: * If pain score is 4 or above: No action needed, pain <4.

## 2015-08-13 LAB — HELICOBACTER PYLORI SCREEN-BIOPSY: UREASE: NEGATIVE

## 2015-10-12 ENCOUNTER — Encounter: Payer: Self-pay | Admitting: Internal Medicine

## 2015-10-12 ENCOUNTER — Ambulatory Visit (INDEPENDENT_AMBULATORY_CARE_PROVIDER_SITE_OTHER): Payer: Medicare HMO | Admitting: Internal Medicine

## 2015-10-12 VITALS — BP 112/68 | HR 72 | Ht 71.0 in | Wt 217.4 lb

## 2015-10-12 DIAGNOSIS — K222 Esophageal obstruction: Secondary | ICD-10-CM | POA: Diagnosis not present

## 2015-10-12 DIAGNOSIS — K2100 Gastro-esophageal reflux disease with esophagitis, without bleeding: Secondary | ICD-10-CM

## 2015-10-12 DIAGNOSIS — K253 Acute gastric ulcer without hemorrhage or perforation: Secondary | ICD-10-CM

## 2015-10-12 DIAGNOSIS — R1310 Dysphagia, unspecified: Secondary | ICD-10-CM | POA: Diagnosis not present

## 2015-10-12 NOTE — Progress Notes (Signed)
HISTORY OF PRESENT ILLNESS:  David Gutierrez is a 81 y.o. male with multiple medical problems as outlined below. He presents today for follow-up after having undergone upper endoscopy with esophageal dilation on 08/11/2015. At that time he was complaining of intermittent solid food dysphagia. Examination revealed a distal esophageal stricture with esophagitis and gastric erosions. Dilation was [place]. He was prescribed omeprazole 20 mg daily. Testing for Helicobacter pylori was negative. The patient reports that overall his swallowing has improved. However, he does describe some intermittent dysphagia item such as barbecue or dry chicken. He states that he does need careful. Upon questioning, he informs me that he stopped omeprazole after one prescription. GI review of systems is otherwise negative. His last colonoscopy in 2012 with diminutive adenomas (Dr. Sharlett Iles).  REVIEW OF SYSTEMS:  All non-GI ROS negative upon review  Past Medical History  Diagnosis Date  . Hiatal hernia   . Esophageal reflux   . Unspecified gastritis and gastroduodenitis without mention of hemorrhage   . Diverticulosis of colon (without mention of hemorrhage)   . Personal history of colonic polyps 07/07/2003    adenomatous and hyperplastic   . Interstitial cystitis   . Irritable bowel syndrome   . Hypogonadism male   . LBBB (left bundle branch block)   . CAD (coronary artery disease)     a. Myoview 11/13:  EF 38%, inf and IS defect c/w scar but no ischemia:  b. Cardiac CT 11/13:  Ca score 318 Agatson units, pLAD and dCFX plaque;   c. LHC 11/07/12:  pLAD 30%, oD1 40%, oCFX 30%, dCFX 40-50%, mRCA 40%, EF 50% (frequent PVCs and short run of NSVT with injection)  . NICM (nonischemic cardiomyopathy) ([place])     ? 2/2 LBBB => a. echo 11/13: diff HK, worse in septum and apex, mod LVE, mild LVH, EF 40%, mild AI, mild MR, mod LAE  . Arthritis   . External hemorrhoids   . Colon polyp     adenomatous    Past Surgical  History  Procedure Laterality Date  . Lumbar laminectomy  1952  . Replacement total knee      right  . Tonsillectomy and adenoidectomy    . Coronary angioplasty    . Transurethral resection of prostate    . Total knee arthroplasty Left 10/14/2014    Procedure: LEFT TOTAL KNEE ARTHROPLASTY;  Surgeon: [name], MD;  Location: [facility];  Service: Orthopedics;  Laterality: Left;    Social History David Gutierrez  reports that he has quit smoking. He has never used smokeless tobacco. He reports that he drinks alcohol. He reports that he does not use illicit drugs.  family history includes Diabetes type I in his son; Heart disease in his brother; Thyroid cancer in his brother. There is no history of Colon cancer.  Allergies  Allergen Reactions  . Cyclobenzaprine     Lost balance  . Lisinopril     Affected ability to urinate  . Relafen [Nabumetone]     Problems urinating afterwards        PHYSICAL EXAMINATION: Vital signs: BP 112/68 mmHg  Pulse 72  Ht 5\' 11"  (1.803 m)  Wt 217 lb 6.4 oz (98.612 kg)  BMI 30.33 kg/m2 General: Well-developed, well-nourished, no acute distress HEENT: Sclerae are anicteric, conjunctiva pink.  Abdomen: Not reexamined Psychiatric: alert and oriented x3. Cooperative   ASSESSMENT:  #1. GERD complicated by esophagitis and peptic stricture. Status post dilation 08/11/2015. Significantly but incompletely improved as  described  PLAN:  #1. Resume omeprazole 20 mg daily. I explained to him that maintaining PPI therapy post dilation reduce the risk of premature recurrence of stricturing #2. He does not feel that his current swallowing issues warrant repeat dilation at this point. However, he will contact the office should his swallowing issues worsen and he feels that repeat dilation would be welcomed #3. Reflux precautions #4. Routine follow-up 1-2 years  15 minutes spent face-to-face with the patient. Greater than 50% of the time use for counseling  regarding GERD, peptic stricture, and management strategies

## 2015-10-12 NOTE — Patient Instructions (Signed)
Please follow up as needed 

## 2015-11-03 ENCOUNTER — Other Ambulatory Visit: Payer: Self-pay | Admitting: Cardiology

## 2015-11-16 ENCOUNTER — Other Ambulatory Visit: Payer: Self-pay | Admitting: Cardiovascular Disease

## 2015-11-26 ENCOUNTER — Encounter: Payer: Self-pay | Admitting: Cardiology

## 2015-12-11 ENCOUNTER — Encounter: Payer: Self-pay | Admitting: *Deleted

## 2015-12-11 ENCOUNTER — Encounter: Payer: Self-pay | Admitting: Cardiology

## 2015-12-11 ENCOUNTER — Ambulatory Visit (INDEPENDENT_AMBULATORY_CARE_PROVIDER_SITE_OTHER): Payer: PPO | Admitting: Cardiology

## 2015-12-11 VITALS — BP 120/68 | HR 66 | Ht 72.0 in | Wt 221.8 lb

## 2015-12-11 DIAGNOSIS — I429 Cardiomyopathy, unspecified: Secondary | ICD-10-CM | POA: Diagnosis not present

## 2015-12-11 DIAGNOSIS — I2510 Atherosclerotic heart disease of native coronary artery without angina pectoris: Secondary | ICD-10-CM

## 2015-12-11 DIAGNOSIS — R5383 Other fatigue: Secondary | ICD-10-CM | POA: Diagnosis not present

## 2015-12-11 DIAGNOSIS — E785 Hyperlipidemia, unspecified: Secondary | ICD-10-CM

## 2015-12-11 DIAGNOSIS — Z0000 Encounter for general adult medical examination without abnormal findings: Secondary | ICD-10-CM | POA: Diagnosis not present

## 2015-12-11 DIAGNOSIS — Z6830 Body mass index (BMI) 30.0-30.9, adult: Secondary | ICD-10-CM | POA: Diagnosis not present

## 2015-12-11 DIAGNOSIS — I428 Other cardiomyopathies: Secondary | ICD-10-CM

## 2015-12-11 DIAGNOSIS — R430 Anosmia: Secondary | ICD-10-CM | POA: Diagnosis not present

## 2015-12-11 MED ORDER — CARVEDILOL 6.25 MG PO TABS: ORAL_TABLET | ORAL | Status: DC

## 2015-12-11 MED ORDER — ATORVASTATIN CALCIUM 20 MG PO TABS: ORAL_TABLET | ORAL | Status: DC

## 2015-12-11 NOTE — Patient Instructions (Signed)
Medication Instructions:  No changes today  Labwork: None today  Testing/Procedures: Your physician has requested that you have an echocardiogram. Echocardiography is a painless test that uses sound waves to create images of your heart. It provides your doctor with information about the size and shape of your heart and how well your heart's chambers and valves are working. This procedure takes approximately one hour. There are no restrictions for this procedure.    Follow-Up: Your physician wants you to follow-up in: 6 months with Dr [name]. (July 2017).  You will receive a reminder letter in the mail two months in advance. If you don't receive a letter, please call our office to schedule the follow-up appointment.        If you need a refill on your cardiac medications before your next appointment, please call your pharmacy.

## 2015-12-12 NOTE — Progress Notes (Signed)
Patient ID: CAVELL CANCEL, male   DOB: 1933/09/07, 82 y.o.   MRN: [id number] PCP: Dr. Philip Aspen  I saw Mr Laumann initially on 10/09/12 for atypical chest pain. He was noted to have a left bundle branch block on his ECG which may have been chronic (no prior ECGs available to me but he mentioned being told about an ECG abnormality in the past). He was set up for Myoview. This demonstrated an inferior and inferoseptal defect consistent with scar but no ischemia with an EF of 30%. Echocardiogram also demonstrated LV dysfunction with an EF of 40%.  Cardiac CT 10/19/12 did demonstrated calcium score of 318 with proximal LAD and distal circumflex plaque with potentially moderate stenosis. He was ultimately set up for cardiac catheterization. LHC on 11/07/12 demonstrated diffuse moderate nonobstructive CAD (proximal LAD, D1, and distal LCx). Patient was started on carvedilol and lisinopril as well as aspirin and atorvastatin.   Echo in 12/14 showed EF 40-45% with mild LV dilation and diffuse hypokinesis. He had to stop lisinopril and later losartan because both medications seemed to be interacting with his interstitial cystitis and causing urinary retention.  He had uncomplicated left TKR in [id number]. Last echo in 12/15 showed EF 55-60%, mild biatrial enlargement, RV-RA gradient 33 mmHg.   Mr Schey report walking a mile several times a week with no dyspnea.  No chest pain.  However, he generally feels weaker and more fatigued with exertion.  He has given up golf.  He feels like his balance is off.    Labs (11/13): K 4.2, creatinine 0.8  Labs (12/13): K 4.3, creatinine 0.7, BNP 37, Hgb 12.8 Labs (2/14): LDL 52, HDL 55, K 4.8, creatinine 0.8 Labs (9/14): K 4.7, creatinine 0.7, LDL 66, HDL 51 Labs (11/15): K 4.1, creatinine 0.63, HCT 31.2 Labs (12/15): LDL 56, HDL 39  ECG: NSR, LBBB  PMH: 1. Low testosterone 2. Interstitial cystitis 3. LBBB: Chronic  4. CAD:  ETT-Myoview (11/13) with inferior and inferoseptal scar  and EF 30%.  Coronary CTA 11/13 with coronary calcium score 318 Agatston units and proximal LAD/distal LCx plaque with up to moderate stenosis.  LHC (12/13) with 30% proximal LAD, 40% ostial D1, 40-50% diffuse stenosis in the distal LCx.   5. Nonischemic cardiomyopathy: EF 30% by Myoview.  Echo (11/13) with EF 40%, diffuse hypokinesis worst at the apex.  Nonobstructive CAD on cath.  Possible LBBB cardiomyopathy.  Echo (12/14) with EF 40-45%, mild LV dilation, diffuse hypokinesis, mild AI, mildly dilated RV, ascending aorta 4.0 cm.  He has been unable to take ACEI or ARB due to urinary retention.  Echo (12/15) with EF 55-60%, mild biatrial enlargement, RV-RA gradient 33 mmHg.  6. GERD with history of esophageal stricture and dilation.   SH: Lives in [place], retired, quit smoking 20+ years ago.   FH: Brother with CHF, died at 46  ROS: All systems reviewed and negative except as per HPI.    Current Outpatient Prescriptions  Medication Sig Dispense Refill  . aspirin 81 MG tablet Take 81 mg by mouth daily.    Marland Kitchen atorvastatin (LIPITOR) 20 MG tablet TAKE ONE TABLET EACH DAY 90 tablet 3  . bethanechol (URECHOLINE) 25 MG tablet Take 25 mg by mouth 2 (two) times daily.    . carvedilol (COREG) 6.25 MG tablet TAKE ONE AND ONE-HALF TABLET TWICE DAILY 270 tablet 3  . diazepam (VALIUM) 5 MG tablet Take 2.5 mg by mouth 2 (two) times daily.     . Misc Natural  Products (COLON CLEANSER PO) Take 3 capsules by mouth 2 (two) times daily.      Marland Kitchen omeprazole (PRILOSEC) 20 MG capsule Take 1 capsule (20 mg total) by mouth daily. 30 capsule 11   No current facility-administered medications for this visit.    BP 120/68 mmHg  Pulse 66  Ht 6' (1.829 m)  Wt 221 lb 12.8 oz (100.608 kg)  BMI 30.07 kg/m2 General: NAD Neck: No JVD, no thyromegaly or thyroid nodule.  Lungs: Clear to auscultation bilaterally with normal respiratory effort. CV: Nondisplaced PMI.  Heart regular S1/S2, paradoxical S2 split, no S3/S4, no  murmur.  No edema.  No carotid bruit.  Normal pedal pulses.  Abdomen: Soft, nontender, no hepatosplenomegaly, no distention.   Neurologic: Alert and oriented x 3.  Psych: Normal affect. Extremities: No clubbing or cyanosis.   Assessment/Plan: 1. CAD: Nonobstructive CAD on cath in 12/13.  I do not think that it played a role in his depressed EF.  Continue ASA 81 and statin.  2. Nonischemic cardiomyopathy: It is possible that this is a LBBB cardiomyopathy.  Most recent echo showed EF improved to 55-60%.  NYHA class I-II.  He is not volume overloaded on exam.  It does not look like he will be able to take ACEI or ARB due to urinary retention.  Continue Coreg 9.375 mg bid. Given increased fatigue, I will get a repeat echo to make sure that EF is still preserved.   3. Hyperlipidemia: Continue statin given known CAD. Will get copy of recent lipids from PCP.  4. Fatigue: Main complaint is increased fatigue.  He had a normal B12 level this summer.  PCP recently did a lab workup for fatigue, will call his office to get copy of labs, hopefully has had CBC and TSH done.  As above, I will get an echo.   Followup in 6 months unless echo is abnormal.   Loralie Champagne 12/12/2015

## 2015-12-14 ENCOUNTER — Other Ambulatory Visit: Payer: Self-pay

## 2015-12-14 DIAGNOSIS — R1310 Dysphagia, unspecified: Secondary | ICD-10-CM

## 2015-12-14 DIAGNOSIS — K2100 Gastro-esophageal reflux disease with esophagitis, without bleeding: Secondary | ICD-10-CM

## 2015-12-14 MED ORDER — OMEPRAZOLE 20 MG PO CPDR: 20.0000 mg | DELAYED_RELEASE_CAPSULE | Freq: Every day | ORAL | Status: DC

## 2015-12-15 ENCOUNTER — Encounter: Payer: Self-pay | Admitting: Cardiology

## 2015-12-15 ENCOUNTER — Other Ambulatory Visit: Payer: Self-pay | Admitting: *Deleted

## 2015-12-15 DIAGNOSIS — E785 Hyperlipidemia, unspecified: Secondary | ICD-10-CM

## 2015-12-21 ENCOUNTER — Telehealth: Payer: Self-pay | Admitting: Cardiology

## 2015-12-21 DIAGNOSIS — I2510 Atherosclerotic heart disease of native coronary artery without angina pectoris: Secondary | ICD-10-CM

## 2015-12-21 DIAGNOSIS — E785 Hyperlipidemia, unspecified: Secondary | ICD-10-CM

## 2015-12-21 DIAGNOSIS — I428 Other cardiomyopathies: Secondary | ICD-10-CM

## 2015-12-21 NOTE — Telephone Encounter (Signed)
Pt advised to hold lipitor for 2 weeks to see if any improvement of symptoms, hold off on lipid profile for now.

## 2015-12-21 NOTE — Telephone Encounter (Signed)
Patient's wife states pt is having problems with balance and his legs, feels it is related to lipitor.  Pt does have a lipid profile scheduled for 12/24/15.  Pt's wife advised I will forward to Dr [name] for review.

## 2015-12-21 NOTE — Telephone Encounter (Signed)
New message ° ° ° ° ° °Talk to the nurse.  Pt would not tell me what he wanted °

## 2015-12-21 NOTE — Telephone Encounter (Signed)
Dr Ezekiel Slocumb you still want lipid profile 12/24/15?

## 2015-12-21 NOTE — Telephone Encounter (Signed)
Hold for 2 weeks to see if there is any improvement. However, unlikely to cause his balance problems (more likely neuropathy), usual side effect is pain and not balance issues.  If no improvement off Lipitor for 2 weeks, would restart.  Please call at the end of that time to see how he's doing.

## 2015-12-21 NOTE — Telephone Encounter (Signed)
Would probably hold off if he will be off Lipitor.

## 2015-12-24 ENCOUNTER — Other Ambulatory Visit: Payer: Self-pay

## 2015-12-24 ENCOUNTER — Other Ambulatory Visit: Payer: PPO

## 2015-12-24 ENCOUNTER — Ambulatory Visit (HOSPITAL_COMMUNITY): Payer: PPO | Attending: Cardiovascular Disease

## 2015-12-24 DIAGNOSIS — Z87891 Personal history of nicotine dependence: Secondary | ICD-10-CM | POA: Diagnosis not present

## 2015-12-24 DIAGNOSIS — I447 Left bundle-branch block, unspecified: Secondary | ICD-10-CM | POA: Insufficient documentation

## 2015-12-24 DIAGNOSIS — I2510 Atherosclerotic heart disease of native coronary artery without angina pectoris: Secondary | ICD-10-CM | POA: Diagnosis not present

## 2015-12-24 DIAGNOSIS — I429 Cardiomyopathy, unspecified: Secondary | ICD-10-CM | POA: Insufficient documentation

## 2015-12-24 DIAGNOSIS — Z96652 Presence of left artificial knee joint: Secondary | ICD-10-CM | POA: Diagnosis not present

## 2015-12-24 DIAGNOSIS — I517 Cardiomegaly: Secondary | ICD-10-CM | POA: Insufficient documentation

## 2015-12-24 DIAGNOSIS — I340 Nonrheumatic mitral (valve) insufficiency: Secondary | ICD-10-CM | POA: Diagnosis not present

## 2015-12-24 DIAGNOSIS — I428 Other cardiomyopathies: Secondary | ICD-10-CM

## 2015-12-24 DIAGNOSIS — I351 Nonrheumatic aortic (valve) insufficiency: Secondary | ICD-10-CM | POA: Insufficient documentation

## 2015-12-24 DIAGNOSIS — E785 Hyperlipidemia, unspecified: Secondary | ICD-10-CM | POA: Diagnosis not present

## 2015-12-28 ENCOUNTER — Telehealth: Payer: Self-pay | Admitting: Cardiology

## 2015-12-28 NOTE — Telephone Encounter (Signed)
LMTCB

## 2015-12-28 NOTE — Telephone Encounter (Signed)
Then would have him restart atorvastatin, and if no changes in his symptoms, continue it.

## 2015-12-28 NOTE — Telephone Encounter (Signed)
Pt states he would like to continue to hold lipitor, pt is asking when he should plan to have lipid profile. Pt advised I will forward to Dr [name] for review.

## 2015-12-28 NOTE — Telephone Encounter (Signed)
Probably another couple of weeks to check lipids, but benefit from the statin comes from keeping plaque out of coronaries (he had moderate plaque on prior cath) so regardless of LDL would ideally be on at least a low dose statin.

## 2015-12-28 NOTE — Telephone Encounter (Signed)
Pt advised, verbalized understanding, lipid profile scheduled for 01/12/16.

## 2015-12-28 NOTE — Telephone Encounter (Signed)
Pt states that he has been started on Vitamin D supplement and medication for hypothroidism. Pt states since starting these medications he feels better in general.  Pt states it is hard to tell if stopping atorvastatin has made a difference in the way his legs and feet feel, he still has numbness in his feet and unsteadiness in his legs.

## 2015-12-28 NOTE — Telephone Encounter (Signed)
New message ° ° ° ° °Returning a call to the nurse °

## 2015-12-28 NOTE — Addendum Note (Signed)
Addended by: Katrine Coho on: 12/28/2015 11:26 AM   Modules accepted: Orders

## 2015-12-28 NOTE — Telephone Encounter (Signed)
Spoke with patient about medication/lab.

## 2015-12-28 NOTE — Telephone Encounter (Signed)
Dr Vassie Moment would like to continue to hold lipitor for a little longer, then check lipids off lipitor. He is asking how long he should be off lipitor before he checks lipids.

## 2016-01-12 ENCOUNTER — Other Ambulatory Visit (INDEPENDENT_AMBULATORY_CARE_PROVIDER_SITE_OTHER): Payer: PPO | Admitting: *Deleted

## 2016-01-12 DIAGNOSIS — E785 Hyperlipidemia, unspecified: Secondary | ICD-10-CM | POA: Diagnosis not present

## 2016-01-12 LAB — LIPID PANEL
CHOL/HDL RATIO: 3.5 ratio (ref <=5.0)
CHOLESTEROL: 152 mg/dL (ref 125–200)
HDL: 43 mg/dL (ref >=40)
LDL Cholesterol: 94 mg/dL (ref <130)
Triglycerides: 75 mg/dL (ref <150)
VLDL: 15 mg/dL (ref <30)

## 2016-01-20 ENCOUNTER — Other Ambulatory Visit: Payer: Self-pay | Admitting: *Deleted

## 2016-01-20 DIAGNOSIS — E785 Hyperlipidemia, unspecified: Secondary | ICD-10-CM

## 2016-01-20 MED ORDER — ROSUVASTATIN CALCIUM 5 MG PO TABS: 5.0000 mg | ORAL_TABLET | Freq: Every day | ORAL | Status: DC

## 2016-01-22 DIAGNOSIS — L812 Freckles: Secondary | ICD-10-CM | POA: Diagnosis not present

## 2016-01-22 DIAGNOSIS — D692 Other nonthrombocytopenic purpura: Secondary | ICD-10-CM | POA: Diagnosis not present

## 2016-01-22 DIAGNOSIS — L821 Other seborrheic keratosis: Secondary | ICD-10-CM | POA: Diagnosis not present

## 2016-01-22 DIAGNOSIS — D1801 Hemangioma of skin and subcutaneous tissue: Secondary | ICD-10-CM | POA: Diagnosis not present

## 2016-01-22 DIAGNOSIS — Z85828 Personal history of other malignant neoplasm of skin: Secondary | ICD-10-CM | POA: Diagnosis not present

## 2016-01-22 DIAGNOSIS — L570 Actinic keratosis: Secondary | ICD-10-CM | POA: Diagnosis not present

## 2016-02-17 DIAGNOSIS — E784 Other hyperlipidemia: Secondary | ICD-10-CM | POA: Diagnosis not present

## 2016-02-17 DIAGNOSIS — I10 Essential (primary) hypertension: Secondary | ICD-10-CM | POA: Diagnosis not present

## 2016-02-17 DIAGNOSIS — Z125 Encounter for screening for malignant neoplasm of prostate: Secondary | ICD-10-CM | POA: Diagnosis not present

## 2016-02-23 DIAGNOSIS — I872 Venous insufficiency (chronic) (peripheral): Secondary | ICD-10-CM | POA: Diagnosis not present

## 2016-02-23 DIAGNOSIS — Z0000 Encounter for general adult medical examination without abnormal findings: Secondary | ICD-10-CM | POA: Diagnosis not present

## 2016-02-23 DIAGNOSIS — E784 Other hyperlipidemia: Secondary | ICD-10-CM | POA: Diagnosis not present

## 2016-02-23 DIAGNOSIS — N3010 Interstitial cystitis (chronic) without hematuria: Secondary | ICD-10-CM | POA: Diagnosis not present

## 2016-02-23 DIAGNOSIS — M545 Low back pain: Secondary | ICD-10-CM | POA: Diagnosis not present

## 2016-02-23 DIAGNOSIS — Z6830 Body mass index (BMI) 30.0-30.9, adult: Secondary | ICD-10-CM | POA: Diagnosis not present

## 2016-02-23 DIAGNOSIS — R200 Anesthesia of skin: Secondary | ICD-10-CM | POA: Diagnosis not present

## 2016-02-23 DIAGNOSIS — K219 Gastro-esophageal reflux disease without esophagitis: Secondary | ICD-10-CM | POA: Diagnosis not present

## 2016-02-23 DIAGNOSIS — I10 Essential (primary) hypertension: Secondary | ICD-10-CM | POA: Diagnosis not present

## 2016-02-23 DIAGNOSIS — I2510 Atherosclerotic heart disease of native coronary artery without angina pectoris: Secondary | ICD-10-CM | POA: Diagnosis not present

## 2016-02-23 DIAGNOSIS — Z1389 Encounter for screening for other disorder: Secondary | ICD-10-CM | POA: Diagnosis not present

## 2016-03-02 DIAGNOSIS — I10 Essential (primary) hypertension: Secondary | ICD-10-CM | POA: Diagnosis not present

## 2016-03-02 DIAGNOSIS — R5383 Other fatigue: Secondary | ICD-10-CM | POA: Diagnosis not present

## 2016-03-08 DIAGNOSIS — Z13820 Encounter for screening for osteoporosis: Secondary | ICD-10-CM | POA: Diagnosis not present

## 2016-03-24 ENCOUNTER — Other Ambulatory Visit (INDEPENDENT_AMBULATORY_CARE_PROVIDER_SITE_OTHER): Payer: PPO | Admitting: *Deleted

## 2016-03-24 DIAGNOSIS — E785 Hyperlipidemia, unspecified: Secondary | ICD-10-CM

## 2016-03-24 LAB — HEPATIC FUNCTION PANEL
ALBUMIN: 3.6 g/dL (ref 3.6–5.1)
ALK PHOS: 62 U/L (ref 40–115)
ALT: 14 U/L (ref 9–46)
AST: 18 U/L (ref 10–35)
BILIRUBIN TOTAL: 0.7 mg/dL (ref 0.2–1.2)
Bilirubin, Direct: 0.2 mg/dL (ref <=0.2)
Indirect Bilirubin: 0.5 mg/dL (ref 0.2–1.2)
Total Protein: 5.9 g/dL — ABNORMAL LOW (ref 6.1–8.1)

## 2016-03-24 LAB — LIPID PANEL
Cholesterol: 145 mg/dL (ref 125–200)
HDL: 46 mg/dL (ref >=40)
LDL CALC: 81 mg/dL (ref <130)
Total CHOL/HDL Ratio: 3.2 Ratio (ref <=5.0)
Triglycerides: 92 mg/dL (ref <150)
VLDL: 18 mg/dL (ref <30)

## 2016-03-24 NOTE — Addendum Note (Signed)
Addended by: Eulis Foster on: 03/24/2016 07:35 AM   Modules accepted: Orders

## 2016-03-25 ENCOUNTER — Telehealth: Payer: Self-pay | Admitting: Cardiology

## 2016-03-25 ENCOUNTER — Other Ambulatory Visit: Payer: Self-pay | Admitting: *Deleted

## 2016-03-25 NOTE — Telephone Encounter (Signed)
Spoke with patient about recent lab results 

## 2016-03-25 NOTE — Telephone Encounter (Signed)
New Message:  Pt is returning Ann's call in regards to some lab results. Please f/u

## 2016-05-06 ENCOUNTER — Other Ambulatory Visit (INDEPENDENT_AMBULATORY_CARE_PROVIDER_SITE_OTHER): Payer: PPO

## 2016-05-06 ENCOUNTER — Ambulatory Visit (INDEPENDENT_AMBULATORY_CARE_PROVIDER_SITE_OTHER): Payer: PPO | Admitting: Neurology

## 2016-05-06 ENCOUNTER — Other Ambulatory Visit: Payer: Self-pay | Admitting: Neurology

## 2016-05-06 ENCOUNTER — Encounter: Payer: Self-pay | Admitting: Neurology

## 2016-05-06 VITALS — BP 110/70 | HR 67 | Ht 72.0 in | Wt 219.4 lb

## 2016-05-06 DIAGNOSIS — G621 Alcoholic polyneuropathy: Secondary | ICD-10-CM

## 2016-05-06 DIAGNOSIS — R269 Unspecified abnormalities of gait and mobility: Secondary | ICD-10-CM | POA: Diagnosis not present

## 2016-05-06 LAB — FOLATE: FOLATE: 12.2 ng/mL (ref >5.9)

## 2016-05-06 LAB — VITAMIN B12: VITAMIN B 12: 242 pg/mL (ref 211–911)

## 2016-05-06 NOTE — Patient Instructions (Addendum)
1.  Check blood work 2.  Advised to cut back on alcohol 3.  Continue home exercises for balance 4.  Please start using a walking stick when you are on any uneven surfaces  Return to clinic in 6 months  [facility]  Preventing Falls in the Home   Falls are common, often dreaded events in the lives of older people. Aside from the obvious injuries and even death that may result, falls can cause wide-ranging consequences including loss of independence, mental decline, decreased activity, and mobility. Younger people are also at risk of falling, especially those with chronic illnesses and fatigue.  Ways to reduce the risk for falling:  * Examine diet and medications. Warm foods and alcohol dilate blood vessels, which can lead to dizziness when standing. Sleep aids, antidepressants, and pain medications can also increase the likelihood of a fall.  * Get a vison exam. Poor vision, cataracts, and glaucoma increase the chances of falling.  * Check foot gear. Shoes should fit snugly and have a sturdy, nonskid sole and broad, low heel.  * Participate in a physician-approved exercise program to build and maintain muscle strength and improve balance and coordination.  * Increase vitamin D intake. Vitamin D improves muscle strength and increases the amount of calcium the body is able to absorb and deposit in bones.  How to prevent falls from common hazards:  * Floors - Remove all loose wires, cords, and throw rugs. Minimize clutter. Make sure rugs are anchored and smooth. Keep furniture in its usual place.  * Chairs - Use chairs with straight backs, armrests, and firm seats. Add firm cushions to existing pieces to add height.  * Bathroom - Install grab bars and non-skid tape in the tub or shower. Use a bathtub transfer bench or a shower chair with a back support. Use an elevated toilet seat and/or safety rails to assist standing from a low surface. Do not use towel racks or bathroom tissue holders to  help you stand.  * Lighting - Make sure halls, stairways, and entrances are well-lit. Install a night light in your bathroom or hallway. Make sure there is a light switch at the top and bottom of the staircase. Turn lights on if you get up in the middle of the night. Make sure lamps or light switches are within reach of the bed if you have to get up during the night.  * Kitchen - Install non-skid rubber mats near the sink and stove. Clean spills immediately. Store frequently used utensils, pots, and pans between waist and eye level. This helps prevent reaching and bending. Sit when getting things out of the lower cupboards.  * Living room / [place] furniture with wide spaces in between, giving enough room to move around. Establish a route through the living room that gives you something to hold onto as you walk.  * Stairs - Make sure treads, rails, and rugs are secure. Install a rail on both sides of the stairs. If stairs are a threat, it might be helpful to arrange most of your activities on the lower level to reduce the number of times you must climb the stairs.  * Entrances and doorways - Install metal handles on the walls adjacent to the doorknobs of all doors to make it more secure as you travel through the doorway.  Tips for maintaining balance:  * Keep at least one hand free at all times Try using a backpack or fanny pack to hold things  rather than carrying them in your hands. Never carry objects in both hands when walking as this interferes with keeping your balance.  * Attempt to swing both arms from front to back while walking. This might require a conscious effort if Parkinson's disease has diminished your movement. It will, however, help you to maintain balance and posture, and reduce fatigue.  * Consciously lift your feet off the ground when walking. Shuffling and dragging of the feet is a common culprit in losing your balance.  * When trying to navigate turns, use a "U" technique of  facing forward and making a wide turn, rather than pivoting sharply.  * Try to stand with your feet shoulder-length apart. When your feet are close together for any length of time, you increase your risk of losing your balance and falling.  * Do one thing at a time. Do not try to walk and accomplish another task, such as reading or looking around. The decrease in your automatic reflexes complicates motor function, so the less distraction, the better.  * Do not wear rubber or gripping soled shoes, they might "catch" on the floor and cause tripping.  * Move slowly when changing positions. Use deliberate, concentrated movements and, if needed, use a grab bar or walking aid. Count fifteen (15) seconds after standing to begin walking.  * If balance is a continuous problem, you might want to consider a walking aid such as a cane, walking stick, or walker. Once you have mastered walking with help, you may be ready to try it again on your own.  This information is provided by [facility] and is not intended to replace the medical advice of your physician or other health care providers. Please consult your physician or other health care providers for advice regarding your specific medical condition.

## 2016-05-06 NOTE — Progress Notes (Signed)
[facility] Clinic Note - Initial Visit   Date: 05/06/2016  David Gutierrez MRN: [id number] DOB: [birth year]   Dear Dr. Philip Aspen:  Thank you for your kind referral of David Gutierrez for consultation of neuropathy. Although his history is well known to you, please allow [place] to reiterate it for the purpose of our medical record. The patient was accompanied to the clinic by self.   History of Present Illness: David Gutierrez is a 82 y.o. right-handed Caucasian male with hypertension, CAD, GERD, hyperlipidemia, interstitial cystitis, s/p lumbar surgery (1952) and hypothyroidism presenting for evaluation of bilateral feet paresthesias.    A few years ago, he began noticing imbalance when climbing hills while playing golf.  He gave up playing golf late 2016 because of difficulty with balance.  He has not had any falls.  He walk independently, but when walking his dog he takes a hiking stick which helps. He stays very active and had a trainer as well as classes that he attends for balance.   Several years ago, he also noticed that he has numbness involving the feet.  He initially thought it was due to his crestor because symptoms got worse when he started this.  Despite stopping the medication, he continues to have symptoms. He drinks about 4-5oz vodka and occasionally wine for the past 20 years.  No history of diabetes.   Out-side paper records, electronic medical record, and images have been reviewed where available and summarized as:  Labs 03/02/2016:  TSH 3.02, Na 142, K 4.7, Glucose 94, BUN 14, Cr 0.7, LFTs normal  Past Medical History  Diagnosis Date  . Hiatal hernia   . Esophageal reflux   . Unspecified gastritis and gastroduodenitis without mention of hemorrhage   . Diverticulosis of colon (without mention of hemorrhage)   . Personal history of colonic polyps 07/07/2003    adenomatous and hyperplastic   . Interstitial cystitis   . Irritable bowel syndrome   .  Hypogonadism male   . LBBB (left bundle branch block)   . CAD (coronary artery disease)     a. Myoview 11/13:  EF 38%, inf and IS defect c/w scar but no ischemia:  b. Cardiac CT 11/13:  Ca score 318 Agatson units, pLAD and dCFX plaque;   c. LHC 11/07/12:  pLAD 30%, oD1 40%, oCFX 30%, dCFX 40-50%, mRCA 40%, EF 50% (frequent PVCs and short run of NSVT with injection)  . NICM (nonischemic cardiomyopathy) ([place])     ? 2/2 LBBB => a. echo 11/13: diff HK, worse in septum and apex, mod LVE, mild LVH, EF 40%, mild AI, mild MR, mod LAE  . Arthritis   . External hemorrhoids   . Colon polyp     adenomatous    Past Surgical History  Procedure Laterality Date  . Lumbar laminectomy  1952  . Replacement total knee      right  . Tonsillectomy and adenoidectomy    . Coronary angioplasty    . Transurethral resection of prostate    . Total knee arthroplasty Left 10/14/2014    Procedure: LEFT TOTAL KNEE ARTHROPLASTY;  Surgeon: [name], MD;  Location: [facility];  Service: Orthopedics;  Laterality: Left;     Medications:  Outpatient Encounter Prescriptions as of 05/06/2016  Medication Sig Note  . aspirin 81 MG tablet Take 81 mg by mouth daily.   . bethanechol (URECHOLINE) 25 MG tablet Take 25 mg by mouth 2 (two) times daily.   Marland Kitchen  carvedilol (COREG) 6.25 MG tablet TAKE ONE AND ONE-HALF TABLET TWICE DAILY   . diazepam (VALIUM) 5 MG tablet Take 2.5 mg by mouth 2 (two) times daily.  10/01/2014: -  . levothyroxine (LEVOTHROID) 25 MCG tablet Take 25 mcg by mouth daily.   . Misc Natural Products (COLON CLEANSER PO) Take 3 capsules by mouth 2 (two) times daily.     Marland Kitchen omeprazole (PRILOSEC) 20 MG capsule Take 1 capsule (20 mg total) by mouth daily.   . [DISCONTINUED] rosuvastatin (CRESTOR) 5 MG tablet 1 tablet by mouth every other day    No facility-administered encounter medications on file as of 05/06/2016.     Allergies:  Allergies  Allergen Reactions  . Cyclobenzaprine     Lost balance  . Lisinopril       Affected ability to urinate  . Relafen [Nabumetone]     Problems urinating afterwards     Family History: Family History  Problem Relation Age of Onset  . Colon cancer Neg Hx   . Thyroid cancer Brother   . Diabetes type I Son   . Healthy Mother   . Healthy Father   . Other Sister   . Heart disease Brother   . Healthy Sister   . Healthy Sister     Social History: Social History  Substance Use Topics  . Smoking status: Former Research scientist (life sciences)  . Smokeless tobacco: Never Used  . Alcohol Use: 0.0 oz/week    0 Standard drinks or equivalent per week     Comment: 2-3 drinks per day    Social History   Social History Narrative   3 caffinated drinks per day.  Lives with wife in a one story home.  Has 3 children.  Retired from [employer].  Education: BA from [employer].     Review of Systems:  CONSTITUTIONAL: No fevers, chills, night sweats, or weight loss.   EYES: No visual changes or eye pain ENT: No hearing changes.  No history of nose bleeds.   RESPIRATORY: No cough, wheezing and shortness of breath.   CARDIOVASCULAR: Negative for chest pain, and palpitations.   GI: Negative for abdominal discomfort, blood in stools or black stools.  No recent change in bowel habits.   GU:  No history of incontinence.   MUSCLOSKELETAL: No history of joint pain or swelling.  No myalgias.   SKIN: Negative for lesions, rash, and itching.   HEMATOLOGY/ONCOLOGY: Negative for prolonged bleeding, bruising easily, and swollen nodes.  No history of cancer.   ENDOCRINE: Negative for cold or heat intolerance, polydipsia or goiter.   PSYCH:  No depression or anxiety symptoms.   NEURO: As Above.   Vital Signs:  BP 110/70 mmHg  Pulse 67  Ht 6' (1.829 m)  Wt 219 lb 7 oz (99.536 kg)  BMI 29.75 kg/m2  SpO2 97%   General Medical Exam:   General:  Well appearing, comfortable.   Eyes/ENT: see cranial nerve examination.   Neck: No masses appreciated.  Full range of motion without tenderness.  No  carotid bruits. Respiratory:  Clear to auscultation, good air entry bilaterally.   Cardiac:  Regular rate and rhythm, no murmur.   Extremities:  No deformities, edema, or skin discoloration.  Skin:  No rashes or lesions.  Neurological Exam: MENTAL STATUS including orientation to time, place, person, recent and remote memory, attention span and concentration, language, and fund of knowledge is normal.  Speech is not dysarthric.  CRANIAL NERVES: II:  No visual field defects.  Unremarkable fundi.   III-IV-VI: Pupils equal round and reactive to light.  Normal conjugate, extra-ocular eye movements in all directions of gaze.  No nystagmus.  No ptosis.   V:  Normal facial sensation.    VII:  Normal facial symmetry and movements.  No pathologic facial reflexes.  VIII:  Normal hearing and vestibular function.   IX-X:  Normal palatal movement.   XI:  Normal shoulder shrug and head rotation.   XII:  Normal tongue strength and range of motion, no deviation or fasciculation.  MOTOR:  No atrophy, fasciculations or abnormal movements.  No pronator drift.  Tone is normal.    Right Upper Extremity:    Left Upper Extremity:    Deltoid  5/5   Deltoid  5/5   Biceps  5/5   Biceps  5/5   Triceps  5/5   Triceps  5/5   Wrist extensors  5/5   Wrist extensors  5/5   Wrist flexors  5/5   Wrist flexors  5/5   Finger extensors  5/5   Finger extensors  5/5   Finger flexors  5/5   Finger flexors  5/5   Dorsal interossei  5/5   Dorsal interossei  5/5   Abductor pollicis  5/5   Abductor pollicis  5/5   Tone (Ashworth scale)  0  Tone (Ashworth scale)  0   Right Lower Extremity:    Left Lower Extremity:    Hip flexors  5/5   Hip flexors  5/5   Hip extensors  5/5   Hip extensors  5/5   Knee flexors  5/5   Knee flexors  5/5   Knee extensors  5/5   Knee extensors  5/5   Dorsiflexors  5/5   Dorsiflexors  5/5   Plantarflexors  5/5   Plantarflexors  5/5   Toe extensors  5/5   Toe extensors  5/5   Toe flexors  5/5    Toe flexors  5/5   Tone (Ashworth scale)  0  Tone (Ashworth scale)  0   MSRs:  Right                                                                 Left brachioradialis 2+  brachioradialis 2+  biceps 2+  biceps 2+  triceps 2+  triceps 2+  patellar 1+  patellar 1+  ankle jerk 0  ankle jerk 0  Hoffman no  Hoffman no  plantar response down  plantar response down   SENSORY:  Vibration, light touch, temperature and pin prick reduced distal to ankles bilaterally.  There is mild sway with Rhomberg testing.  COORDINATION/GAIT: Normal finger-to- nose-finger.  Intact rapid alternating movements bilaterally.  Able to rise from a chair without using arms.  Gait narrow based and stable. He is very unsteady with tandem, stressed gait intact.    IMPRESSION: David Gutierrez is a 82 year-old gentleman referred for evaluation of bilateral feet paresthesias.  His neurological examination shows a distal predominant large fiber peripheral neuropathy. I had extensive discussion with the patient regarding the pathogenesis, etiology, management, and natural course of neuropathy.  With his history of daily alcohol consumption, this is most likely the underlying etiology.  I recommended that he try to cut back  on his alcohol intake, if not, stop all together.  Neuropathy tends to be slowly progressive, especially if a underlying etiology is not adequately managed.  I will also screen for other potential causes of neuropathy.  PLAN/RECOMMENDATIONS:  1.  Check vitamin B12, copper, folate, SPEP with IFE 2.  Advised to cut back on alcohol 3.  Continue home exercises for balance 4.  Start using a walking stick, fall precautions discussed  Return to clinic in 6 months.   The duration of this appointment visit was 45 minutes of face-to-face time with the patient.  Greater than 50% of this time was spent in counseling, explanation of diagnosis, planning of further management, and coordination of care.   Thank you for  allowing me to participate in patient's care.  If I can answer any additional questions, I would be pleased to do so.    Sincerely,    Donika K. Posey Pronto, DO

## 2016-05-09 LAB — COPPER, SERUM: COPPER: 97 ug/dL (ref 72–166)

## 2016-05-10 DIAGNOSIS — N5201 Erectile dysfunction due to arterial insufficiency: Secondary | ICD-10-CM | POA: Diagnosis not present

## 2016-05-10 DIAGNOSIS — N401 Enlarged prostate with lower urinary tract symptoms: Secondary | ICD-10-CM | POA: Diagnosis not present

## 2016-05-10 DIAGNOSIS — R3912 Poor urinary stream: Secondary | ICD-10-CM | POA: Diagnosis not present

## 2016-05-10 DIAGNOSIS — N312 Flaccid neuropathic bladder, not elsewhere classified: Secondary | ICD-10-CM | POA: Diagnosis not present

## 2016-05-10 DIAGNOSIS — N3010 Interstitial cystitis (chronic) without hematuria: Secondary | ICD-10-CM | POA: Diagnosis not present

## 2016-05-10 DIAGNOSIS — E291 Testicular hypofunction: Secondary | ICD-10-CM | POA: Diagnosis not present

## 2016-05-10 LAB — PROTEIN ELECTROPHORESIS, SERUM
ALBUMIN ELP: 3.9 g/dL (ref 3.8–4.8)
ALPHA-1-GLOBULIN: 0.3 g/dL (ref 0.2–0.3)
ALPHA-2-GLOBULIN: 0.5 g/dL (ref 0.5–0.9)
Beta 2: 0.4 g/dL (ref 0.2–0.5)
Beta Globulin: 0.4 g/dL (ref 0.4–0.6)
Gamma Globulin: 1 g/dL (ref 0.8–1.7)
TOTAL PROTEIN, SERUM ELECTROPHOR: 6.3 g/dL (ref 6.1–8.1)

## 2016-05-10 LAB — IMMUNOFIXATION ELECTROPHORESIS
IGA: 179 mg/dL (ref 81–463)
IGG (IMMUNOGLOBIN G), SERUM: 804 mg/dL (ref 694–1618)
IGM, SERUM: 66 mg/dL (ref 48–271)

## 2016-05-11 ENCOUNTER — Ambulatory Visit (INDEPENDENT_AMBULATORY_CARE_PROVIDER_SITE_OTHER): Payer: PPO | Admitting: *Deleted

## 2016-05-11 DIAGNOSIS — E538 Deficiency of other specified B group vitamins: Secondary | ICD-10-CM | POA: Diagnosis not present

## 2016-05-11 MED ORDER — CYANOCOBALAMIN 1000 MCG/ML IJ SOLN
1000.0000 ug | Freq: Once | INTRAMUSCULAR | Status: AC
  Administered 2016-05-11: 1000 ug via INTRAMUSCULAR

## 2016-05-11 NOTE — Progress Notes (Signed)
Patient in for B12 injection. 

## 2016-05-12 ENCOUNTER — Ambulatory Visit (INDEPENDENT_AMBULATORY_CARE_PROVIDER_SITE_OTHER): Payer: PPO | Admitting: *Deleted

## 2016-05-12 DIAGNOSIS — E538 Deficiency of other specified B group vitamins: Secondary | ICD-10-CM | POA: Diagnosis not present

## 2016-05-12 MED ORDER — CYANOCOBALAMIN 1000 MCG/ML IJ SOLN
1000.0000 ug | Freq: Once | INTRAMUSCULAR | Status: AC
  Administered 2016-05-12: 1000 ug via INTRAMUSCULAR

## 2016-05-12 NOTE — Progress Notes (Signed)
Patient in for B12 injection. 

## 2016-05-13 ENCOUNTER — Ambulatory Visit (INDEPENDENT_AMBULATORY_CARE_PROVIDER_SITE_OTHER): Payer: PPO | Admitting: *Deleted

## 2016-05-13 DIAGNOSIS — E538 Deficiency of other specified B group vitamins: Secondary | ICD-10-CM

## 2016-05-13 MED ORDER — CYANOCOBALAMIN 1000 MCG/ML IJ SOLN
1000.0000 ug | Freq: Once | INTRAMUSCULAR | Status: AC
Start: 2016-05-13 — End: 2016-05-13
  Administered 2016-05-13: 1000 ug via INTRAMUSCULAR

## 2016-05-13 NOTE — Progress Notes (Signed)
Patient in for B12 injection. 

## 2016-05-16 ENCOUNTER — Encounter: Payer: Self-pay | Admitting: Internal Medicine

## 2016-05-16 ENCOUNTER — Ambulatory Visit (INDEPENDENT_AMBULATORY_CARE_PROVIDER_SITE_OTHER): Payer: PPO | Admitting: *Deleted

## 2016-05-16 DIAGNOSIS — E538 Deficiency of other specified B group vitamins: Secondary | ICD-10-CM | POA: Diagnosis not present

## 2016-05-16 MED ORDER — CYANOCOBALAMIN 1000 MCG/ML IJ SOLN
1000.0000 ug | Freq: Once | INTRAMUSCULAR | Status: AC
  Administered 2016-05-16: 1000 ug via INTRAMUSCULAR

## 2016-05-16 NOTE — Progress Notes (Signed)
Patient in for B12 injection. 

## 2016-05-17 ENCOUNTER — Ambulatory Visit (INDEPENDENT_AMBULATORY_CARE_PROVIDER_SITE_OTHER): Payer: PPO | Admitting: *Deleted

## 2016-05-17 DIAGNOSIS — E538 Deficiency of other specified B group vitamins: Secondary | ICD-10-CM

## 2016-05-17 MED ORDER — CYANOCOBALAMIN 1000 MCG/ML IJ SOLN
1000.0000 ug | Freq: Once | INTRAMUSCULAR | Status: AC
  Administered 2016-05-17: 1000 ug via INTRAMUSCULAR

## 2016-05-17 NOTE — Progress Notes (Signed)
Patient in for B12 injection. 

## 2016-05-18 ENCOUNTER — Ambulatory Visit (INDEPENDENT_AMBULATORY_CARE_PROVIDER_SITE_OTHER): Payer: PPO | Admitting: *Deleted

## 2016-05-18 DIAGNOSIS — E538 Deficiency of other specified B group vitamins: Secondary | ICD-10-CM | POA: Diagnosis not present

## 2016-05-18 MED ORDER — CYANOCOBALAMIN 1000 MCG/ML IJ SOLN
1000.0000 ug | Freq: Once | INTRAMUSCULAR | Status: AC
  Administered 2016-05-18: 1000 ug via INTRAMUSCULAR

## 2016-05-18 NOTE — Progress Notes (Signed)
Patient in for B12 injection. 

## 2016-05-19 ENCOUNTER — Ambulatory Visit (INDEPENDENT_AMBULATORY_CARE_PROVIDER_SITE_OTHER): Payer: PPO | Admitting: *Deleted

## 2016-05-19 DIAGNOSIS — E538 Deficiency of other specified B group vitamins: Secondary | ICD-10-CM | POA: Diagnosis not present

## 2016-05-19 MED ORDER — CYANOCOBALAMIN 1000 MCG/ML IJ SOLN
1000.0000 ug | Freq: Once | INTRAMUSCULAR | Status: AC
  Administered 2016-05-19: 1000 ug via INTRAMUSCULAR

## 2016-05-19 NOTE — Progress Notes (Signed)
Patient in for B12 injection. 

## 2016-05-23 ENCOUNTER — Ambulatory Visit (INDEPENDENT_AMBULATORY_CARE_PROVIDER_SITE_OTHER): Payer: PPO | Admitting: Neurology

## 2016-05-23 DIAGNOSIS — J301 Allergic rhinitis due to pollen: Secondary | ICD-10-CM | POA: Diagnosis not present

## 2016-05-23 DIAGNOSIS — E538 Deficiency of other specified B group vitamins: Secondary | ICD-10-CM

## 2016-05-23 DIAGNOSIS — J3081 Allergic rhinitis due to animal (cat) (dog) hair and dander: Secondary | ICD-10-CM | POA: Diagnosis not present

## 2016-05-23 DIAGNOSIS — K219 Gastro-esophageal reflux disease without esophagitis: Secondary | ICD-10-CM | POA: Diagnosis not present

## 2016-05-23 DIAGNOSIS — J3089 Other allergic rhinitis: Secondary | ICD-10-CM | POA: Diagnosis not present

## 2016-05-23 MED ORDER — CYANOCOBALAMIN 1000 MCG/ML IJ SOLN
1000.0000 ug | Freq: Once | INTRAMUSCULAR | Status: AC
  Administered 2016-05-23: 1000 ug via INTRAMUSCULAR

## 2016-05-23 NOTE — Progress Notes (Signed)
B12 injection to right deltoid with no apparent complications.   

## 2016-05-30 ENCOUNTER — Ambulatory Visit (INDEPENDENT_AMBULATORY_CARE_PROVIDER_SITE_OTHER): Payer: PPO | Admitting: *Deleted

## 2016-05-30 DIAGNOSIS — E538 Deficiency of other specified B group vitamins: Secondary | ICD-10-CM

## 2016-05-30 MED ORDER — CYANOCOBALAMIN 1000 MCG/ML IJ SOLN
1000.0000 ug | Freq: Once | INTRAMUSCULAR | Status: AC
  Administered 2016-05-30: 1000 ug via INTRAMUSCULAR

## 2016-05-30 NOTE — Progress Notes (Signed)
Patient in for B12 injection. 

## 2016-06-06 ENCOUNTER — Ambulatory Visit (INDEPENDENT_AMBULATORY_CARE_PROVIDER_SITE_OTHER): Payer: PPO | Admitting: *Deleted

## 2016-06-06 DIAGNOSIS — E538 Deficiency of other specified B group vitamins: Secondary | ICD-10-CM

## 2016-06-06 MED ORDER — CYANOCOBALAMIN 1000 MCG/ML IJ SOLN
1000.0000 ug | Freq: Once | INTRAMUSCULAR | Status: AC
  Administered 2016-06-06: 1000 ug via INTRAMUSCULAR

## 2016-06-06 NOTE — Progress Notes (Signed)
Patient in for B12 injection. 

## 2016-06-13 ENCOUNTER — Ambulatory Visit: Payer: PPO

## 2016-06-13 ENCOUNTER — Ambulatory Visit (INDEPENDENT_AMBULATORY_CARE_PROVIDER_SITE_OTHER): Payer: PPO | Admitting: *Deleted

## 2016-06-13 DIAGNOSIS — E538 Deficiency of other specified B group vitamins: Secondary | ICD-10-CM | POA: Diagnosis not present

## 2016-06-13 MED ORDER — CYANOCOBALAMIN 1000 MCG/ML IJ SOLN
1000.0000 ug | Freq: Once | INTRAMUSCULAR | Status: AC
  Administered 2016-06-13: 1000 ug via INTRAMUSCULAR

## 2016-06-13 NOTE — Progress Notes (Signed)
Patient in for B12 injection. 

## 2016-06-25 DIAGNOSIS — D125 Benign neoplasm of sigmoid colon: Secondary | ICD-10-CM | POA: Diagnosis not present

## 2016-06-25 DIAGNOSIS — D120 Benign neoplasm of cecum: Secondary | ICD-10-CM | POA: Diagnosis not present

## 2016-07-11 ENCOUNTER — Ambulatory Visit (INDEPENDENT_AMBULATORY_CARE_PROVIDER_SITE_OTHER): Payer: PPO | Admitting: *Deleted

## 2016-07-11 DIAGNOSIS — E538 Deficiency of other specified B group vitamins: Secondary | ICD-10-CM | POA: Diagnosis not present

## 2016-07-11 MED ORDER — CYANOCOBALAMIN 1000 MCG/ML IJ SOLN
1000.0000 ug | Freq: Once | INTRAMUSCULAR | Status: AC
  Administered 2016-07-11: 1000 ug via INTRAMUSCULAR

## 2016-07-18 ENCOUNTER — Ambulatory Visit: Payer: PPO | Admitting: Internal Medicine

## 2016-07-20 ENCOUNTER — Encounter (INDEPENDENT_AMBULATORY_CARE_PROVIDER_SITE_OTHER): Payer: Self-pay

## 2016-07-20 ENCOUNTER — Encounter: Payer: Self-pay | Admitting: Internal Medicine

## 2016-07-20 ENCOUNTER — Ambulatory Visit (INDEPENDENT_AMBULATORY_CARE_PROVIDER_SITE_OTHER): Payer: PPO | Admitting: Internal Medicine

## 2016-07-20 VITALS — BP 108/66 | HR 62 | Ht 71.0 in | Wt 216.0 lb

## 2016-07-20 DIAGNOSIS — Z860100 Personal history of colon polyps, unspecified: Secondary | ICD-10-CM

## 2016-07-20 DIAGNOSIS — K2100 Gastro-esophageal reflux disease with esophagitis, without bleeding: Secondary | ICD-10-CM

## 2016-07-20 DIAGNOSIS — R1310 Dysphagia, unspecified: Secondary | ICD-10-CM | POA: Diagnosis not present

## 2016-07-20 DIAGNOSIS — K222 Esophageal obstruction: Secondary | ICD-10-CM

## 2016-07-20 MED ORDER — NA SULFATE-K SULFATE-MG SULF 17.5-3.13-1.6 GM/177ML PO SOLN: 1.0000 | Freq: Once | ORAL | 0 refills | Status: AC

## 2016-07-20 NOTE — Progress Notes (Signed)
HISTORY OF PRESENT ILLNESS:  David Gutierrez is a 82 y.o. male with past medical history as listed below. He presents today with a chief complaint of recurrent dysphagia and the need for surveillance colonoscopy. I last saw the patient 10/12/2015 after recently having undergone upper endoscopy with esophageal dilation via 64 French Maloney dilator for symptomatic peptic stricture. At that time he reported that his symptoms were significantly but incompletely improved. He does stay on omeprazole 20 mg daily for GERD. He denies active reflux symptoms. He has had progressive intermittent solid food dysphagia, principally to meat. GI review of systems is otherwise remarkable for occasional constipation which she attributes to age. He has undergone multiple colonoscopies in the past and has a history of adenomatous colon polyps. His last colonoscopy with Dr. Sharlett Iles was performed June 2012. Examination was complete with good preparation. 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 Biopsies were performed with were felt to be hyperplastic polyps. However 2 fragments of tubular adenoma noted from the cecum. Patient remains active though has been playing less golf secondary to lower extremity neuropathy.  REVIEW OF SYSTEMS:  All non-GI ROS negative except for allergies, neuropathy  Past Medical History:  Diagnosis Date  . Arthritis   . CAD (coronary artery disease)    a. Myoview 11/13:  EF 38%, inf and IS defect c/w scar but no ischemia:  b. Cardiac CT 11/13:  Ca score 318 Agatson units, pLAD and dCFX plaque;   c. LHC 11/07/12:  pLAD 30%, oD1 40%, oCFX 30%, dCFX 40-50%, mRCA 40%, EF 50% (frequent PVCs and short run of NSVT with injection)  . Colon polyp    adenomatous  . Diverticulosis of colon (without mention of hemorrhage)   . Esophageal reflux   . External hemorrhoids   . Hiatal hernia   . Hypogonadism male   . Interstitial cystitis   . Irritable bowel syndrome   . LBBB (left bundle branch block)   . NICM  (nonischemic cardiomyopathy) ([place])    ? 2/2 LBBB => a. echo 11/13: diff HK, worse in septum and apex, mod LVE, mild LVH, EF 40%, mild AI, mild MR, mod LAE  . Personal history of colonic polyps 07/07/2003   adenomatous and hyperplastic   . Unspecified gastritis and gastroduodenitis without mention of hemorrhage     Past Surgical History:  Procedure Laterality Date  . CORONARY ANGIOPLASTY    . [place]  . REPLACEMENT TOTAL KNEE     right  . TONSILLECTOMY AND ADENOIDECTOMY    . TOTAL KNEE ARTHROPLASTY Left 10/14/2014   Procedure: LEFT TOTAL KNEE ARTHROPLASTY;  Surgeon: [name], MD;  Location: [facility];  Service: Orthopedics;  Laterality: Left;  . TRANSURETHRAL RESECTION OF PROSTATE      Social History [name]  reports that he has quit smoking. He has never used smokeless tobacco. He reports that he drinks alcohol. He reports that he does not use drugs.  family history includes Diabetes type I in his son; Healthy in his father, mother, sister, and sister; Heart disease in his brother; Other in his sister; Thyroid cancer in his brother.  Allergies  Allergen Reactions  . Cyclobenzaprine     Lost balance  . Lisinopril     Affected ability to urinate  . Relafen [Nabumetone]     Problems urinating afterwards        PHYSICAL EXAMINATION: Vital signs: BP 108/66   Pulse 62   Ht 5\' 11"  (1.803 m)  Wt 216 lb (98 kg)   BMI 30.13 kg/m   Constitutional: generally well-appearing, no acute distress Psychiatric: alert and oriented x3, cooperative Eyes: extraocular movements intact, anicteric, conjunctiva pink Mouth: oral pharynx moist, no lesions Neck: supple no lymphadenopathy Cardiovascular: heart regular rate and rhythm, no murmur Lungs: clear to auscultation bilaterally Abdomen: soft, nontender, nondistended, no obvious ascites, no peritoneal signs, normal bowel sounds, no organomegaly Rectal:Deferred until colonoscopy Extremities: no clubbing cyanosis or  lower extremity edema bilaterally Skin: no lesions on visible extremities Neuro: No focal deficits. Normal DTRs. Cranial nerves intact  ASSESSMENT:  #1. GERD. Controlled with PPI #2. Recurrent dysphagia secondary to known esophageal stricture #3. History of adenomatous colon polyps. Last colonoscopy 5 years ago with cold biopsy forcep removal of adenoma #4. Multiple medical problems. Stable  PLAN:  #1. Reflux precautions #2. Continue omeprazole #3. Schedule upper endoscopy with esophageal dilation.The nature of the procedure, as well as the risks, benefits, and alternatives were carefully and thoroughly reviewed with the patient. Ample time for discussion and questions allowed. The patient understood, was satisfied, and agreed to proceed. #4. Schedule surveillance colonoscopy.The nature of the procedure, as well as the risks, benefits, and alternatives were carefully and thoroughly reviewed with the patient. Ample time for discussion and questions allowed. The patient understood, was satisfied, and agreed to proceed.

## 2016-07-20 NOTE — Patient Instructions (Signed)
You have been scheduled for an endoscopy and colonoscopy. Please follow the written instructions given to you at your visit today. Please pick up your prep supplies at the pharmacy within the next 1-3 days. If you use inhalers (even only as needed), please bring them with you on the day of your procedure.  

## 2016-07-21 DIAGNOSIS — K219 Gastro-esophageal reflux disease without esophagitis: Secondary | ICD-10-CM | POA: Diagnosis not present

## 2016-07-21 DIAGNOSIS — J3081 Allergic rhinitis due to animal (cat) (dog) hair and dander: Secondary | ICD-10-CM | POA: Diagnosis not present

## 2016-07-21 DIAGNOSIS — J3089 Other allergic rhinitis: Secondary | ICD-10-CM | POA: Diagnosis not present

## 2016-07-21 DIAGNOSIS — J301 Allergic rhinitis due to pollen: Secondary | ICD-10-CM | POA: Diagnosis not present

## 2016-08-03 ENCOUNTER — Ambulatory Visit (AMBULATORY_SURGERY_CENTER): Payer: PPO | Admitting: Internal Medicine

## 2016-08-03 ENCOUNTER — Encounter: Payer: Self-pay | Admitting: Internal Medicine

## 2016-08-03 VITALS — BP 118/64 | HR 64 | Temp 97.5°F | Resp 14 | Ht 71.0 in | Wt 216.0 lb

## 2016-08-03 DIAGNOSIS — D122 Benign neoplasm of ascending colon: Secondary | ICD-10-CM

## 2016-08-03 DIAGNOSIS — D120 Benign neoplasm of cecum: Secondary | ICD-10-CM | POA: Diagnosis not present

## 2016-08-03 DIAGNOSIS — D125 Benign neoplasm of sigmoid colon: Secondary | ICD-10-CM | POA: Diagnosis not present

## 2016-08-03 DIAGNOSIS — I10 Essential (primary) hypertension: Secondary | ICD-10-CM | POA: Diagnosis not present

## 2016-08-03 DIAGNOSIS — F419 Anxiety disorder, unspecified: Secondary | ICD-10-CM | POA: Diagnosis not present

## 2016-08-03 DIAGNOSIS — K635 Polyp of colon: Secondary | ICD-10-CM

## 2016-08-03 DIAGNOSIS — R1310 Dysphagia, unspecified: Secondary | ICD-10-CM

## 2016-08-03 DIAGNOSIS — K219 Gastro-esophageal reflux disease without esophagitis: Secondary | ICD-10-CM

## 2016-08-03 DIAGNOSIS — Z86010 Personal history of colonic polyps: Secondary | ICD-10-CM

## 2016-08-03 DIAGNOSIS — E039 Hypothyroidism, unspecified: Secondary | ICD-10-CM | POA: Diagnosis not present

## 2016-08-03 DIAGNOSIS — M545 Low back pain: Secondary | ICD-10-CM | POA: Diagnosis not present

## 2016-08-03 MED ORDER — SODIUM CHLORIDE 0.9 % IV SOLN: 500.0000 mL | INTRAVENOUS | Status: DC

## 2016-08-03 NOTE — Patient Instructions (Signed)
YOU HAD AN ENDOSCOPIC PROCEDURE TODAY AT [facility]:   Refer to the procedure report that was given to you for any specific questions about what was found during the examination.  If the procedure report does not answer your questions, please call your gastroenterologist to clarify.  If you requested that your care partner not be given the details of your procedure findings, then the procedure report has been included in a sealed envelope for you to review at your convenience later.  YOU SHOULD EXPECT: Some feelings of bloating in the abdomen. Passage of more gas than usual.  Walking can help get rid of the air that was put into your GI tract during the procedure and reduce the bloating. If you had a lower endoscopy (such as a colonoscopy or flexible sigmoidoscopy) you may notice spotting of blood in your stool or on the toilet paper. If you underwent a bowel prep for your procedure, you may not have a normal bowel movement for a few days.  Please Note:  You might notice some irritation and congestion in your nose or some drainage.  This is from the oxygen used during your procedure.  There is no need for concern and it should clear up in a day or so.  SYMPTOMS TO REPORT IMMEDIATELY:   Following lower endoscopy (colonoscopy or flexible sigmoidoscopy):  Excessive amounts of blood in the stool  Significant tenderness or worsening of abdominal pains  Swelling of the abdomen that is new, acute  Fever of 100F or higher   Following upper endoscopy (EGD)  Vomiting of blood or coffee ground material  New chest pain or pain under the shoulder blades  Painful or persistently difficult swallowing  New shortness of breath  Fever of 100F or higher  Black, tarry-looking stools  For urgent or emergent issues, a gastroenterologist can be reached at any hour by calling [phone].   DIET:  See dilation diet-handout. Drink plenty of fluids but you should avoid alcoholic beverages for  24 hours.  ACTIVITY:  You should plan to take it easy for the rest of today and you should NOT DRIVE or use heavy machinery until tomorrow (because of the sedation medicines used during the test).    FOLLOW UP: Our staff will call the number listed on your records the next business day following your procedure to check on you and address any questions or concerns that you may have regarding the information given to you following your procedure. If we do not reach you, we will leave a message.  However, if you are feeling well and you are not experiencing any problems, there is no need to return our call.  We will assume that you have returned to your regular daily activities without incident.  If any biopsies were taken you will be contacted by phone or by letter within the next 1-3 weeks.  Please call us at [phone] if you have not heard about the biopsies in 3 weeks.    SIGNATURES/CONFIDENTIALITY: You and/or your care partner have signed paperwork which will be entered into your electronic medical record.  These signatures attest to the fact that that the information above on your After Visit Summary has been reviewed and is understood.  Full responsibility of the confidentiality of this discharge information lies with you and/or your care-partner.  Dilation diet, stricture, polyps, hemorrhoids, diverticulosis-  Wait pathology report.

## 2016-08-03 NOTE — Op Note (Signed)
[place] Patient Name: Lalo Monton Procedure Date: 08/03/2016 3:53 PM MRN: [id number] Endoscopist: [name]. Henrene Pastor , MD Age: 82 Referring MD:  Date of Birth: 1933-01-07 Gender: Male Account #: [id number] Procedure:                Upper GI endoscopy, with Venia Minks dilation of the                            esophagus-56 f Indications:              Dysphagia Medicines:                Monitored Anesthesia Care Procedure:                Pre-Anesthesia Assessment:                           - Prior to the procedure, a History and Physical                            was performed, and patient medications and                            allergies were reviewed. The patient's tolerance of                            previous anesthesia was also reviewed. The risks                            and benefits of the procedure and the sedation                            options and risks were discussed with the patient.                            All questions were answered, and informed consent                            was obtained. Prior Anticoagulants: The patient has                            taken no previous anticoagulant or antiplatelet                            agents. ASA Grade Assessment: II - A patient with                            mild systemic disease. After reviewing the risks                            and benefits, the patient was deemed in                            satisfactory condition to undergo the procedure.  After obtaining informed consent, the endoscope was                            passed under direct vision. Throughout the                            procedure, the patient's blood pressure, pulse, and                            oxygen saturations were monitored continuously. The                            Model GIF-HQ190 [id number]) scope was introduced                            through the mouth, and advanced to the second part                          of duodenum. The upper GI endoscopy was                            accomplished without difficulty. The patient                            tolerated the procedure well. Scope In: Scope Out: Findings:                 One benign-appearing, intrinsic stenosis was found.                            This measured 1.5 cm (inner diameter). The scope                            was withdrawn. Dilation was performed with a                            Maloney dilator with no resistance at 56 Fr. The                            dilation site was examined following endoscope                            reinsertion and showed no trauma.                           The exam of the esophagus was otherwise normal.                           The stomach was normal.                           The examined duodenum was normal.                           The cardia and gastric fundus were normal on  retroflexion, save small sliding hiatal hernia. Complications:            No immediate complications. Estimated Blood Loss:     Estimated blood loss: none. Impression:               - Benign-appearing esophageal stenosis. Dilated.                           - Normal stomach.                           - Normal examined duodenum.                           - No specimens collected. Recommendation:           - Patient has a contact number available for                            emergencies. The signs and symptoms of potential                            delayed complications were discussed with the                            patient. Return to normal activities tomorrow.                            Written discharge instructions were provided to the                            patient.                           - Resume previous diet.                           - Continue present medications. [name]. Henrene Pastor, MD 08/03/2016 4:20:54 PM This report has been signed electronically.

## 2016-08-03 NOTE — Progress Notes (Signed)
Report to PACU, RN, vss, BBS= Clear.  

## 2016-08-03 NOTE — Op Note (Signed)
[place] Patient Name: David Gutierrez Procedure Date: 08/03/2016 3:11 PM MRN: [id number] Endoscopist: [name]. Henrene Pastor , MD Age: 82 Referring MD:  Date of Birth: 09-11-1933 Gender: Male Account #: [id number] Procedure:                Colonoscopy, with submucosal injection and cold                            snare polypectomy X6 Indications:              Surveillance: Personal history of adenomatous                            polyps on last colonoscopy 5 years ago, High risk                            colon cancer surveillance: Personal history of                            non-advanced adenoma. Previous exam with Dr. Verl Blalock 2012. Multiple colonoscopies prior to                            that exam Medicines:                Monitored Anesthesia Care Procedure:                Pre-Anesthesia Assessment:                           - Prior to the procedure, a History and Physical                            was performed, and patient medications and                            allergies were reviewed. The patient's tolerance of                            previous anesthesia was also reviewed. The risks                            and benefits of the procedure and the sedation                            options and risks were discussed with the patient.                            All questions were answered, and informed consent                            was obtained. Prior Anticoagulants: The patient has  taken no previous anticoagulant or antiplatelet                            agents. ASA Grade Assessment: II - A patient with                            mild systemic disease. After reviewing the risks                            and benefits, the patient was deemed in                            satisfactory condition to undergo the procedure.                           After obtaining informed consent, the colonoscope                       was passed under direct vision. Throughout the                            procedure, the patient's blood pressure, pulse, and                            oxygen saturations were monitored continuously. The                            Model CF-HQ190L [id number]) scope was introduced                            through the anus and advanced to the the cecum,                            identified by appendiceal orifice and ileocecal                            valve. The ileocecal valve, appendiceal orifice,                            and rectum were photographed. The quality of the                            bowel preparation was good. The colonoscopy was                            performed without difficulty. The patient tolerated                            the procedure well. The bowel preparation used was                            SUPREP. Scope In: 3:18:14 PM Scope Out: 3:50:48 PM Scope Withdrawal Time: 0 hours 24 minutes 49 seconds  Total Procedure Duration: 0 hours 32 minutes 34 seconds  Findings:  Four sessile polyps were found in the cecum. The                            polyps were 4 to 15 mm in size. The largest polyp                            was removed piecemeal with a saline injection-lift                            technique using a cold snare. Resection and                            retrieval were complete. The other polyps were                            removed with cold snare technique.                           Two polyps were found in the sigmoid colon and                            ascending colon. The polyps were 5 mm in size.                            These polyps were removed with a cold snare.                            Resection and retrieval were complete.                           Multiple large-mouthed diverticula were found in                            the left colon.                           Internal hemorrhoids were  found during retroflexion.                           The exam was otherwise without abnormality on                            direct and retroflexion views. Melanosis coli was                            present. Complications:            No immediate complications. Estimated blood loss:                            None. Estimated Blood Loss:     Estimated blood loss: none. Impression:               - Four 4 to 15 mm polyps in the cecum, removed  using injection-lift and a cold snare. Resected and                            retrieved.                           - Two 5 mm polyps in the sigmoid colon and in the                            ascending colon, removed with a cold snare.                            Resected and retrieved.                           - Diverticulosis in the left colon.                           - Internal hemorrhoids.                           - The examination was otherwise normal on direct                            and retroflexion views. Recommendation:           - Repeat colonoscopy date to be determined after                            pending pathology results are reviewed for                            surveillance.                           - Patient has a contact number available for                            emergencies. The signs and symptoms of potential                            delayed complications were discussed with the                            patient. Return to normal activities tomorrow.                            Written discharge instructions were provided to the                            patient.                           - Resume previous diet.                           - Continue present medications.                           -  Await pathology results. [name]. Henrene Pastor, MD 08/03/2016 4:16:34 PM This report has been signed electronically.

## 2016-08-03 NOTE — Progress Notes (Signed)
Called to room to assist during endoscopic procedure.  Patient ID and intended procedure confirmed with present staff. Received instructions for my participation in the procedure from the performing physician.  

## 2016-08-04 ENCOUNTER — Telehealth: Payer: Self-pay | Admitting: *Deleted

## 2016-08-04 NOTE — Telephone Encounter (Signed)
  Follow up Call-  Call back number 08/03/2016 08/11/2015  Post procedure Call Back phone  # [phone]  Permission to leave phone message Yes Yes  Some recent data might be hidden     Patient questions:  Do you have a fever, pain , or abdominal swelling? No. Pain Score  0 *  Have you tolerated food without any problems? Yes.    Have you been able to return to your normal activities? Yes.    Do you have any questions about your discharge instructions: Diet   No. Medications  No. Follow up visit  No.  Do you have questions or concerns about your Care? No.  Actions: * If pain score is 4 or above: No action needed, pain <4.

## 2016-08-05 DIAGNOSIS — L821 Other seborrheic keratosis: Secondary | ICD-10-CM | POA: Diagnosis not present

## 2016-08-05 DIAGNOSIS — L570 Actinic keratosis: Secondary | ICD-10-CM | POA: Diagnosis not present

## 2016-08-05 DIAGNOSIS — Z85828 Personal history of other malignant neoplasm of skin: Secondary | ICD-10-CM | POA: Diagnosis not present

## 2016-08-09 ENCOUNTER — Ambulatory Visit (INDEPENDENT_AMBULATORY_CARE_PROVIDER_SITE_OTHER): Payer: PPO | Admitting: *Deleted

## 2016-08-09 DIAGNOSIS — E538 Deficiency of other specified B group vitamins: Secondary | ICD-10-CM

## 2016-08-09 MED ORDER — CYANOCOBALAMIN 1000 MCG/ML IJ SOLN
1000.0000 ug | Freq: Once | INTRAMUSCULAR | Status: AC
  Administered 2016-08-09: 1000 ug via INTRAMUSCULAR

## 2016-08-10 ENCOUNTER — Encounter: Payer: Self-pay | Admitting: Internal Medicine

## 2016-08-10 DIAGNOSIS — K219 Gastro-esophageal reflux disease without esophagitis: Secondary | ICD-10-CM | POA: Diagnosis not present

## 2016-08-10 DIAGNOSIS — J3089 Other allergic rhinitis: Secondary | ICD-10-CM | POA: Diagnosis not present

## 2016-08-10 DIAGNOSIS — J301 Allergic rhinitis due to pollen: Secondary | ICD-10-CM | POA: Diagnosis not present

## 2016-08-10 DIAGNOSIS — J3081 Allergic rhinitis due to animal (cat) (dog) hair and dander: Secondary | ICD-10-CM | POA: Diagnosis not present

## 2016-08-20 DIAGNOSIS — Z23 Encounter for immunization: Secondary | ICD-10-CM | POA: Diagnosis not present

## 2016-09-05 ENCOUNTER — Ambulatory Visit (INDEPENDENT_AMBULATORY_CARE_PROVIDER_SITE_OTHER): Payer: PPO | Admitting: Neurology

## 2016-09-05 DIAGNOSIS — E538 Deficiency of other specified B group vitamins: Secondary | ICD-10-CM

## 2016-09-05 MED ORDER — CYANOCOBALAMIN 1000 MCG/ML IJ SOLN
1000.0000 ug | Freq: Once | INTRAMUSCULAR | Status: AC
  Administered 2016-09-05: 1000 ug via INTRAMUSCULAR

## 2016-09-05 NOTE — Progress Notes (Signed)
B12 injection to right deltoid with no apparent complications.   

## 2016-10-03 ENCOUNTER — Ambulatory Visit (INDEPENDENT_AMBULATORY_CARE_PROVIDER_SITE_OTHER): Payer: PPO | Admitting: *Deleted

## 2016-10-03 DIAGNOSIS — E538 Deficiency of other specified B group vitamins: Secondary | ICD-10-CM

## 2016-10-03 MED ORDER — CYANOCOBALAMIN 1000 MCG/ML IJ SOLN
1000.0000 ug | Freq: Once | INTRAMUSCULAR | Status: AC
  Administered 2016-10-03: 1000 ug via INTRAMUSCULAR

## 2016-10-04 DIAGNOSIS — J301 Allergic rhinitis due to pollen: Secondary | ICD-10-CM | POA: Diagnosis not present

## 2016-10-04 DIAGNOSIS — J3089 Other allergic rhinitis: Secondary | ICD-10-CM | POA: Diagnosis not present

## 2016-10-04 DIAGNOSIS — J3081 Allergic rhinitis due to animal (cat) (dog) hair and dander: Secondary | ICD-10-CM | POA: Diagnosis not present

## 2016-10-04 DIAGNOSIS — K219 Gastro-esophageal reflux disease without esophagitis: Secondary | ICD-10-CM | POA: Diagnosis not present

## 2016-10-25 DIAGNOSIS — J343 Hypertrophy of nasal turbinates: Secondary | ICD-10-CM | POA: Diagnosis not present

## 2016-10-25 DIAGNOSIS — J310 Chronic rhinitis: Secondary | ICD-10-CM | POA: Diagnosis not present

## 2016-10-26 ENCOUNTER — Other Ambulatory Visit (INDEPENDENT_AMBULATORY_CARE_PROVIDER_SITE_OTHER): Payer: Self-pay | Admitting: Otolaryngology

## 2016-10-26 DIAGNOSIS — J329 Chronic sinusitis, unspecified: Secondary | ICD-10-CM

## 2016-10-31 ENCOUNTER — Ambulatory Visit
Admission: RE | Admit: 2016-10-31 | Discharge: 2016-10-31 | Disposition: A | Discharge status: Home / Self Care | Payer: PPO | Source: Ambulatory Visit | Attending: Otolaryngology | Admitting: Otolaryngology

## 2016-10-31 DIAGNOSIS — R1310 Dysphagia, unspecified: Secondary | ICD-10-CM | POA: Diagnosis not present

## 2016-10-31 DIAGNOSIS — J329 Chronic sinusitis, unspecified: Secondary | ICD-10-CM

## 2016-11-07 ENCOUNTER — Encounter: Payer: Self-pay | Admitting: Neurology

## 2016-11-07 ENCOUNTER — Ambulatory Visit (INDEPENDENT_AMBULATORY_CARE_PROVIDER_SITE_OTHER): Payer: PPO | Admitting: Neurology

## 2016-11-07 ENCOUNTER — Other Ambulatory Visit (INDEPENDENT_AMBULATORY_CARE_PROVIDER_SITE_OTHER): Payer: PPO

## 2016-11-07 VITALS — BP 108/70 | HR 66 | Ht 71.0 in | Wt 213.4 lb

## 2016-11-07 DIAGNOSIS — G621 Alcoholic polyneuropathy: Secondary | ICD-10-CM

## 2016-11-07 DIAGNOSIS — E538 Deficiency of other specified B group vitamins: Secondary | ICD-10-CM | POA: Diagnosis not present

## 2016-11-07 LAB — VITAMIN B12

## 2016-11-07 MED ORDER — CYANOCOBALAMIN 1000 MCG/ML IJ SOLN: 1000.0000 ug | Freq: Once | INTRAMUSCULAR | Status: DC

## 2016-11-07 NOTE — Progress Notes (Signed)
Follow-up Visit   Date: 11/07/16    David Gutierrez MRN: [id number] DOB: 1933/02/05   Interim History: David Gutierrez is a 82 y.o. right-handed Caucasian male with hypertension, CAD, GERD, hyperlipidemia, interstitial cystitis, s/p lumbar surgery (1952) and hypothyroidism returning to the clinic for follow-up of alcoholic neuropathy.  The patient was accompanied to the clinic by self.  History of present illness: A few years ago, he began noticing imbalance when climbing hills while playing golf.  He gave up playing golf late 2016 because of difficulty with balance.  He has not had any falls.  He walk independently, but when walking his dog he takes a hiking stick which helps. He stays very active and had a trainer as well as classes that he attends for balance.   Several years ago, he also noticed that he has numbness involving the feet.  He initially thought it was due to his crestor because symptoms got worse when he started this.  Despite stopping the medication, he continues to have symptoms. He drinks about 4-5oz vodka and occasionally wine for the past 20 years.  No history of diabetes.   UPDATE 11/07/2016:  He quit drinking alcohol for 4 months and noticed mild improvement in his feet paresthesias, but not enough for him to want to completely abstain from alcohol.  He has been using a foot massager with electrical stimulator, which provides some relief.  He was found to be vitamin B12 deficient and since taking supplements, his energy level has improved.  He has been compliant with using a cane, which he uses mostly when taking his dog for a walk.  There have been no interval falls or hospitalizations.  Medications:  Current Outpatient Prescriptions on File Prior to Visit  Medication Sig Dispense Refill  . aspirin 81 MG tablet Take 81 mg by mouth daily.    . bethanechol (URECHOLINE) 25 MG tablet Take 25 mg by mouth 2 (two) times daily.    . carvedilol (COREG) 6.25 MG tablet TAKE ONE  AND ONE-HALF TABLET TWICE DAILY 270 tablet 3  . diazepam (VALIUM) 5 MG tablet Take 2.5 mg by mouth 2 (two) times daily.     Marland Kitchen levothyroxine (LEVOTHROID) 25 MCG tablet Take 25 mcg by mouth daily.    . Misc Natural Products (COLON CLEANSER PO) Take 3 capsules by mouth 2 (two) times daily.      Marland Kitchen omeprazole (PRILOSEC) 20 MG capsule Take 1 capsule (20 mg total) by mouth daily. 90 capsule 3   Current Facility-Administered Medications on File Prior to Visit  Medication Dose Route Frequency Provider Last Rate Last Dose  . 0.9 %  sodium chloride infusion  500 mL Intravenous Continuous Irene Shipper, MD        Allergies:  Allergies  Allergen Reactions  . Cyclobenzaprine     Lost balance  . Lisinopril     Affected ability to urinate  . Relafen [Nabumetone]     Problems urinating afterwards     Review of Systems:  CONSTITUTIONAL: No fevers, chills, night sweats, or weight loss.  EYES: No visual changes or eye pain ENT: No hearing changes.  No history of nose bleeds.   RESPIRATORY: No cough, wheezing and shortness of breath.   CARDIOVASCULAR: Negative for chest pain, and palpitations.   GI: Negative for abdominal discomfort, blood in stools or black stools.  No recent change in bowel habits.   GU:  No history of incontinence.   MUSCLOSKELETAL: No history of  joint pain or swelling.  No myalgias.   SKIN: Negative for lesions, rash, and itching.   ENDOCRINE: Negative for cold or heat intolerance, polydipsia or goiter.   PSYCH:  No depression or anxiety symptoms.   NEURO: As Above.   Vital Signs:  BP 108/70   Pulse 66   Ht 5\' 11"  (1.803 m)   Wt 213 lb 6 oz (96.8 kg)   SpO2 96%   BMI 29.76 kg/m   Neurological Exam: MENTAL STATUS including orientation to time, place, person, recent and remote memory, attention span and concentration, language, and fund of knowledge is normal.  Speech is not dysarthric.  CRANIAL NERVES:  Pupils round and reactive to light.  Extraocular muscles intact.   Face is symmetric.  MOTOR:  Motor strength is 5/5 throughout including distally in the feet. No atrophy, fasciculations or abnormal movements.  No pronator drift.  Tone is normal.    MSRs:  Right                                                                 Left brachioradialis 2+  brachioradialis 2+  biceps 2+  biceps 2+  triceps 2+  triceps 2+  patellar 1+  patellar 1+  ankle jerk 0  ankle jerk 0   SENSORY:  Vibration and light touch reduced distal to ankles bilaterally.  There is mild sway with Rhomberg testing.  COORDINATION/GAIT:   Gait narrow based and stable. He is very unsteady with tandem.   LABS: Labs 03/02/2016:  TSH 3.02, Na 142, K 4.7, Glucose 94, BUN 14, Cr 0.7, LFTs normal Labs 05/06/2016:  Vitamin B12 242, folate 12.2, copper 97, SPEP with IFE no M protein  IMPRESSION: 1.  Alcoholic peripheral neuropathy manifesting with numbness of the feet and sensory ataxia.   2.  Vitamin B12 deficiency  PLAN/RECOMMENDATIONS:  1.  Continue monthly vitamin B12 injections - administered today 2.  Check vitamin B1 level 3.  Advised to cut back on alcohol 4.  Continue home exercises for balance and encouraged to use a cane as needed especially on uneven surfaces 5.  He may try soaking feet in Epson salt to see if this provides symptom relief  Return to clinic in 6 months.   The duration of this appointment visit was 25 minutes of face-to-face time with the patient.  Greater than 50% of this time was spent in counseling, explanation of diagnosis, planning of further management, and coordination of care.   Thank you for allowing me to participate in patient's care.  If I can answer any additional questions, I would be pleased to do so.    Sincerely,    Donika K. Posey Pronto, DO

## 2016-11-07 NOTE — Addendum Note (Signed)
Addended by: Chester Holstein on: 11/07/2016 12:22 PM   Modules accepted: Orders

## 2016-11-07 NOTE — Patient Instructions (Addendum)
1.  Continue monthly injections 2.  You can try soaking your feet in Epson salt bath  3.  Recommend cutting back on alcohol  4.  Continue to use a cane 5.  Check vitamin B1 level  Return to clinic in 6 months

## 2016-11-08 DIAGNOSIS — J320 Chronic maxillary sinusitis: Secondary | ICD-10-CM | POA: Diagnosis not present

## 2016-11-08 DIAGNOSIS — J322 Chronic ethmoidal sinusitis: Secondary | ICD-10-CM | POA: Diagnosis not present

## 2016-11-08 DIAGNOSIS — J323 Chronic sphenoidal sinusitis: Secondary | ICD-10-CM | POA: Diagnosis not present

## 2016-11-08 DIAGNOSIS — E538 Deficiency of other specified B group vitamins: Secondary | ICD-10-CM | POA: Diagnosis not present

## 2016-11-08 DIAGNOSIS — G621 Alcoholic polyneuropathy: Secondary | ICD-10-CM | POA: Diagnosis not present

## 2016-11-12 LAB — VITAMIN B1: VITAMIN B1 (THIAMINE): 6 nmol/L — AB (ref 8–30)

## 2016-11-14 ENCOUNTER — Telehealth: Payer: Self-pay

## 2016-11-14 MED ORDER — VITAMIN B-1 100 MG PO TABS: 100.0000 mg | ORAL_TABLET | Freq: Every day | ORAL | 2 refills | Status: DC

## 2016-11-14 NOTE — Telephone Encounter (Signed)
Patient notified, verbalized understanding. RX sent to pharmacy.

## 2016-11-14 NOTE — Telephone Encounter (Signed)
-----   Message from Cameron Sprang, MD sent at 11/14/2016  9:59 AM EST ----- Pls let him know B1 level is low, recommend he start taking daily thiamine 100mg , pls send Rx, thanks

## 2016-11-21 DIAGNOSIS — J3089 Other allergic rhinitis: Secondary | ICD-10-CM | POA: Diagnosis not present

## 2016-11-21 DIAGNOSIS — J301 Allergic rhinitis due to pollen: Secondary | ICD-10-CM | POA: Diagnosis not present

## 2016-11-21 DIAGNOSIS — K219 Gastro-esophageal reflux disease without esophagitis: Secondary | ICD-10-CM | POA: Diagnosis not present

## 2016-11-21 DIAGNOSIS — J3081 Allergic rhinitis due to animal (cat) (dog) hair and dander: Secondary | ICD-10-CM | POA: Diagnosis not present

## 2016-12-01 ENCOUNTER — Other Ambulatory Visit: Payer: Self-pay | Admitting: Otolaryngology

## 2016-12-07 ENCOUNTER — Ambulatory Visit (INDEPENDENT_AMBULATORY_CARE_PROVIDER_SITE_OTHER): Payer: PPO | Admitting: *Deleted

## 2016-12-07 DIAGNOSIS — E538 Deficiency of other specified B group vitamins: Secondary | ICD-10-CM | POA: Diagnosis not present

## 2016-12-07 MED ORDER — CYANOCOBALAMIN 1000 MCG/ML IJ SOLN
1000.0000 ug | Freq: Once | INTRAMUSCULAR | Status: AC
  Administered 2016-12-07: 1000 ug via INTRAMUSCULAR

## 2016-12-08 ENCOUNTER — Encounter (HOSPITAL_BASED_OUTPATIENT_CLINIC_OR_DEPARTMENT_OTHER): Payer: Self-pay | Admitting: *Deleted

## 2016-12-08 NOTE — Progress Notes (Signed)
Chart reviewed per Dr. Conrad Oxford, upcoming surgery is ok for day surgery center.

## 2016-12-09 ENCOUNTER — Encounter (HOSPITAL_BASED_OUTPATIENT_CLINIC_OR_DEPARTMENT_OTHER)
Admission: RE | Admit: 2016-12-09 | Discharge: 2016-12-09 | Disposition: A | Discharge status: Home / Self Care | Payer: PPO | Source: Ambulatory Visit | Attending: Otolaryngology | Admitting: Otolaryngology

## 2016-12-09 ENCOUNTER — Other Ambulatory Visit: Payer: Self-pay

## 2016-12-09 DIAGNOSIS — Z79899 Other long term (current) drug therapy: Secondary | ICD-10-CM | POA: Diagnosis not present

## 2016-12-09 DIAGNOSIS — K219 Gastro-esophageal reflux disease without esophagitis: Secondary | ICD-10-CM | POA: Diagnosis not present

## 2016-12-09 DIAGNOSIS — Z6830 Body mass index (BMI) 30.0-30.9, adult: Secondary | ICD-10-CM | POA: Diagnosis not present

## 2016-12-09 DIAGNOSIS — E039 Hypothyroidism, unspecified: Secondary | ICD-10-CM | POA: Diagnosis not present

## 2016-12-09 DIAGNOSIS — J328 Other chronic sinusitis: Secondary | ICD-10-CM | POA: Diagnosis not present

## 2016-12-09 DIAGNOSIS — I10 Essential (primary) hypertension: Secondary | ICD-10-CM | POA: Diagnosis not present

## 2016-12-09 DIAGNOSIS — J320 Chronic maxillary sinusitis: Secondary | ICD-10-CM | POA: Diagnosis not present

## 2016-12-09 DIAGNOSIS — J338 Other polyp of sinus: Secondary | ICD-10-CM | POA: Diagnosis not present

## 2016-12-09 DIAGNOSIS — Z87891 Personal history of nicotine dependence: Secondary | ICD-10-CM | POA: Diagnosis not present

## 2016-12-09 DIAGNOSIS — E669 Obesity, unspecified: Secondary | ICD-10-CM | POA: Diagnosis not present

## 2016-12-09 NOTE — Progress Notes (Signed)
PT in for PAT. EKG done, reviewed and compared with most recent by Dr Therisa Doyne.MDA.

## 2016-12-12 ENCOUNTER — Ambulatory Visit (HOSPITAL_BASED_OUTPATIENT_CLINIC_OR_DEPARTMENT_OTHER)
Admission: RE | Admit: 2016-12-12 | Discharge: 2016-12-12 | Disposition: A | Discharge status: Home / Self Care | Payer: PPO | Source: Ambulatory Visit | Attending: Otolaryngology | Admitting: Otolaryngology

## 2016-12-12 ENCOUNTER — Ambulatory Visit (HOSPITAL_BASED_OUTPATIENT_CLINIC_OR_DEPARTMENT_OTHER): Payer: PPO | Admitting: Anesthesiology

## 2016-12-12 ENCOUNTER — Encounter (HOSPITAL_BASED_OUTPATIENT_CLINIC_OR_DEPARTMENT_OTHER): Payer: Self-pay | Admitting: *Deleted

## 2016-12-12 ENCOUNTER — Encounter (HOSPITAL_BASED_OUTPATIENT_CLINIC_OR_DEPARTMENT_OTHER)
Admission: RE | Disposition: A | Discharge status: Home / Self Care | Payer: Self-pay | Source: Ambulatory Visit | Attending: Otolaryngology

## 2016-12-12 DIAGNOSIS — J329 Chronic sinusitis, unspecified: Secondary | ICD-10-CM | POA: Diagnosis not present

## 2016-12-12 DIAGNOSIS — E785 Hyperlipidemia, unspecified: Secondary | ICD-10-CM | POA: Diagnosis not present

## 2016-12-12 DIAGNOSIS — E669 Obesity, unspecified: Secondary | ICD-10-CM | POA: Diagnosis not present

## 2016-12-12 DIAGNOSIS — J338 Other polyp of sinus: Secondary | ICD-10-CM | POA: Diagnosis not present

## 2016-12-12 DIAGNOSIS — Z79899 Other long term (current) drug therapy: Secondary | ICD-10-CM | POA: Insufficient documentation

## 2016-12-12 DIAGNOSIS — J322 Chronic ethmoidal sinusitis: Secondary | ICD-10-CM | POA: Diagnosis not present

## 2016-12-12 DIAGNOSIS — J328 Other chronic sinusitis: Secondary | ICD-10-CM | POA: Insufficient documentation

## 2016-12-12 DIAGNOSIS — K219 Gastro-esophageal reflux disease without esophagitis: Secondary | ICD-10-CM | POA: Diagnosis not present

## 2016-12-12 DIAGNOSIS — Z87891 Personal history of nicotine dependence: Secondary | ICD-10-CM | POA: Diagnosis not present

## 2016-12-12 DIAGNOSIS — E039 Hypothyroidism, unspecified: Secondary | ICD-10-CM | POA: Diagnosis not present

## 2016-12-12 DIAGNOSIS — Z6830 Body mass index (BMI) 30.0-30.9, adult: Secondary | ICD-10-CM | POA: Diagnosis not present

## 2016-12-12 DIAGNOSIS — J1 Influenza due to other identified influenza virus: Secondary | ICD-10-CM | POA: Diagnosis not present

## 2016-12-12 DIAGNOSIS — I10 Essential (primary) hypertension: Secondary | ICD-10-CM | POA: Insufficient documentation

## 2016-12-12 DIAGNOSIS — R079 Chest pain, unspecified: Secondary | ICD-10-CM | POA: Diagnosis not present

## 2016-12-12 DIAGNOSIS — J320 Chronic maxillary sinusitis: Secondary | ICD-10-CM | POA: Diagnosis not present

## 2016-12-12 DIAGNOSIS — J323 Chronic sphenoidal sinusitis: Secondary | ICD-10-CM | POA: Diagnosis not present

## 2016-12-12 HISTORY — PX: MAXILLARY ANTROSTOMY: SHX2003

## 2016-12-12 HISTORY — PX: ETHMOIDECTOMY: SHX5197

## 2016-12-12 HISTORY — PX: SINUS ENDO W/FUSION: SHX777

## 2016-12-12 HISTORY — PX: SPHENOIDECTOMY: SHX2421

## 2016-12-12 SURGERY — ETHMOIDECTOMY
Anesthesia: General | Laterality: Right

## 2016-12-12 MED ORDER — DEXAMETHASONE SODIUM PHOSPHATE 4 MG/ML IJ SOLN
INTRAMUSCULAR | Status: DC | PRN
  Administered 2016-12-12: 10 mg via INTRAVENOUS

## 2016-12-12 MED ORDER — BACITRACIN ZINC 500 UNIT/GM EX OINT
TOPICAL_OINTMENT | CUTANEOUS | Status: AC
  Filled 2016-12-12: qty 2.7

## 2016-12-12 MED ORDER — FENTANYL CITRATE (PF) 100 MCG/2ML IJ SOLN
50.0000 ug | INTRAMUSCULAR | Status: AC | PRN
  Administered 2016-12-12 (×2): 25 ug via INTRAVENOUS
  Administered 2016-12-12: 50 ug via INTRAVENOUS

## 2016-12-12 MED ORDER — MUPIROCIN 2 % EX OINT
TOPICAL_OINTMENT | CUTANEOUS | Status: AC
  Filled 2016-12-12: qty 22

## 2016-12-12 MED ORDER — LIDOCAINE 2% (20 MG/ML) 5 ML SYRINGE
INTRAMUSCULAR | Status: AC
  Filled 2016-12-12: qty 5

## 2016-12-12 MED ORDER — CEFAZOLIN SODIUM-DEXTROSE 2-3 GM-% IV SOLR
INTRAVENOUS | Status: DC | PRN
  Administered 2016-12-12: 2 g via INTRAVENOUS

## 2016-12-12 MED ORDER — MIDAZOLAM HCL 2 MG/2ML IJ SOLN: 1.0000 mg | INTRAMUSCULAR | Status: DC | PRN

## 2016-12-12 MED ORDER — AMOXICILLIN 875 MG PO TABS: 875.0000 mg | ORAL_TABLET | Freq: Two times a day (BID) | ORAL | 0 refills | Status: AC

## 2016-12-12 MED ORDER — FENTANYL CITRATE (PF) 100 MCG/2ML IJ SOLN
INTRAMUSCULAR | Status: AC
  Filled 2016-12-12: qty 2

## 2016-12-12 MED ORDER — OXYCODONE-ACETAMINOPHEN 5-325 MG PO TABS: 1.0000 | ORAL_TABLET | Freq: Four times a day (QID) | ORAL | 0 refills | Status: DC | PRN

## 2016-12-12 MED ORDER — LACTATED RINGERS IV SOLN
INTRAVENOUS | Status: DC
  Administered 2016-12-12: 10 mL/h via INTRAVENOUS

## 2016-12-12 MED ORDER — SUCCINYLCHOLINE CHLORIDE 20 MG/ML IJ SOLN
INTRAMUSCULAR | Status: DC | PRN
  Administered 2016-12-12: 100 mg via INTRAVENOUS

## 2016-12-12 MED ORDER — ARTIFICIAL TEARS OP OINT
TOPICAL_OINTMENT | OPHTHALMIC | Status: AC
  Filled 2016-12-12: qty 3.5

## 2016-12-12 MED ORDER — SCOPOLAMINE 1 MG/3DAYS TD PT72: 1.0000 | MEDICATED_PATCH | Freq: Once | TRANSDERMAL | Status: DC | PRN

## 2016-12-12 MED ORDER — PROPOFOL 10 MG/ML IV BOLUS
INTRAVENOUS | Status: DC | PRN
  Administered 2016-12-12: 150 mg via INTRAVENOUS

## 2016-12-12 MED ORDER — LIDOCAINE HCL (CARDIAC) 20 MG/ML IV SOLN
INTRAVENOUS | Status: DC | PRN
  Administered 2016-12-12: 100 mg via INTRAVENOUS

## 2016-12-12 MED ORDER — ONDANSETRON HCL 4 MG/2ML IJ SOLN
INTRAMUSCULAR | Status: AC
  Filled 2016-12-12: qty 2

## 2016-12-12 MED ORDER — OXYMETAZOLINE HCL 0.05 % NA SOLN
NASAL | Status: AC
  Filled 2016-12-12: qty 45

## 2016-12-12 MED ORDER — PROPOFOL 10 MG/ML IV BOLUS
INTRAVENOUS | Status: AC
  Filled 2016-12-12: qty 20

## 2016-12-12 MED ORDER — CEFAZOLIN SODIUM-DEXTROSE 2-4 GM/100ML-% IV SOLN
INTRAVENOUS | Status: AC
  Filled 2016-12-12: qty 100

## 2016-12-12 MED ORDER — OXYMETAZOLINE HCL 0.05 % NA SOLN
NASAL | Status: DC | PRN
  Administered 2016-12-12: 1 via TOPICAL

## 2016-12-12 MED ORDER — CIPROFLOXACIN-FLUOCINOLONE PF 0.3-0.025 % OT SOLN
OTIC | Status: AC
  Filled 2016-12-12: qty 1.5

## 2016-12-12 SURGICAL SUPPLY — 64 items
ATTRACTOMAT 16X20 MAGNETIC DRP (DRAPES) IMPLANT
BLADE RAD40 ROTATE 4M 4 5PK (BLADE) IMPLANT
BLADE RAD40 ROTATE 4M 4MM 5PK (BLADE)
BLADE RAD60 ROTATE M4 4 5PK (BLADE) IMPLANT
BLADE RAD60 ROTATE M4 4MM 5PK (BLADE)
BLADE ROTATE RAD 12 4 M4 (BLADE) IMPLANT
BLADE ROTATE RAD 12 4MM M4 (BLADE)
BLADE ROTATE RAD 40 4 M4 (BLADE) IMPLANT
BLADE ROTATE RAD 40 4MM M4 (BLADE)
BLADE ROTATE TRICUT 4MX13CM M4 (BLADE) ×1
BLADE ROTATE TRICUT 4X13 M4 (BLADE) ×2 IMPLANT
BLADE TRICUT ROTATE M4 4 5PK (BLADE) IMPLANT
BLADE TRICUT ROTATE M4 4MM 5PK (BLADE)
BUR HS RAD FRONTAL 3 (BURR) IMPLANT
BUR HS RAD FRONTAL 3MM (BURR)
CANISTER SUC SOCK COL 7IN (MISCELLANEOUS) ×6 IMPLANT
CANISTER SUCT 1200ML W/VALVE (MISCELLANEOUS) ×3 IMPLANT
COAGULATOR SUCT 6 FR SWTCH (ELECTROSURGICAL)
COAGULATOR SUCT 8FR VV (MISCELLANEOUS) IMPLANT
COAGULATOR SUCT SWTCH 10FR 6 (ELECTROSURGICAL) IMPLANT
DECANTER SPIKE VIAL GLASS SM (MISCELLANEOUS) IMPLANT
DRSG NASAL KENNEDY LMNT 8CM (GAUZE/BANDAGES/DRESSINGS) IMPLANT
DRSG NASOPORE 8CM (GAUZE/BANDAGES/DRESSINGS) IMPLANT
DRSG TELFA 3X8 NADH (GAUZE/BANDAGES/DRESSINGS) IMPLANT
ELECT REM PT RETURN 9FT ADLT (ELECTROSURGICAL)
ELECTRODE REM PT RTRN 9FT ADLT (ELECTROSURGICAL) IMPLANT
GLOVE BIO SURGEON STRL SZ 6.5 (GLOVE) ×1 IMPLANT
GLOVE BIO SURGEON STRL SZ7.5 (GLOVE) ×3 IMPLANT
GLOVE BIO SURGEONS STRL SZ 6.5 (GLOVE) ×1
GOWN STRL REUS W/ TWL LRG LVL3 (GOWN DISPOSABLE) ×2 IMPLANT
GOWN STRL REUS W/TWL LRG LVL3 (GOWN DISPOSABLE) ×6
HEMOSTAT SURGICEL 2X14 (HEMOSTASIS) IMPLANT
IV NS 1000ML (IV SOLUTION)
IV NS 1000ML BAXH (IV SOLUTION) IMPLANT
IV NS 500ML (IV SOLUTION) ×3
IV NS 500ML BAXH (IV SOLUTION) ×1 IMPLANT
NDL HYPO 25X1 1.5 SAFETY (NEEDLE) ×1 IMPLANT
NDL PRECISIONGLIDE 27X1.5 (NEEDLE) ×1 IMPLANT
NDL SPNL 25GX3.5 QUINCKE BL (NEEDLE) IMPLANT
NEEDLE HYPO 25X1 1.5 SAFETY (NEEDLE) ×3 IMPLANT
NEEDLE PRECISIONGLIDE 27X1.5 (NEEDLE) ×3 IMPLANT
NEEDLE SPNL 25GX3.5 QUINCKE BL (NEEDLE) IMPLANT
NS IRRIG 1000ML POUR BTL (IV SOLUTION) ×2 IMPLANT
PACK BASIN DAY SURGERY FS (CUSTOM PROCEDURE TRAY) ×3 IMPLANT
PACK ENT DAY SURGERY (CUSTOM PROCEDURE TRAY) ×3 IMPLANT
PACKING NASAL EPIS 4X2.4 XEROG (MISCELLANEOUS) ×2 IMPLANT
PAD DRESSING TELFA 3X8 NADH (GAUZE/BANDAGES/DRESSINGS) IMPLANT
SLEEVE SCD COMPRESS KNEE MED (MISCELLANEOUS) IMPLANT
SOLUTION BUTLER CLEAR DIP (MISCELLANEOUS) ×3 IMPLANT
SPLINT NASAL AIRWAY SILICONE (MISCELLANEOUS) IMPLANT
SPONGE GAUZE 2X2 8PLY STER LF (GAUZE/BANDAGES/DRESSINGS) ×1
SPONGE GAUZE 2X2 8PLY STRL LF (GAUZE/BANDAGES/DRESSINGS) ×2 IMPLANT
SPONGE NEURO XRAY DETECT 1X3 (DISPOSABLE) ×3 IMPLANT
SUT ETHILON 3 0 PS 1 (SUTURE) IMPLANT
SUT PLAIN 4 0 ~~LOC~~ 1 (SUTURE) IMPLANT
SYR 3ML 23GX1 SAFETY (SYRINGE) IMPLANT
TOWEL OR 17X24 6PK STRL BLUE (TOWEL DISPOSABLE) ×5 IMPLANT
TRACKER ENT INSTRUMENT (MISCELLANEOUS) ×3 IMPLANT
TRACKER ENT PATIENT (MISCELLANEOUS) ×3 IMPLANT
TUBE CONNECTING 20'X1/4 (TUBING) ×1
TUBE CONNECTING 20X1/4 (TUBING) ×2 IMPLANT
TUBE SALEM SUMP 16 FR W/ARV (TUBING) IMPLANT
TUBING STRAIGHTSHOT EPS 5PK (TUBING) ×3 IMPLANT
YANKAUER SUCT BULB TIP NO VENT (SUCTIONS) ×3 IMPLANT

## 2016-12-12 NOTE — Anesthesia Preprocedure Evaluation (Addendum)
Anesthesia Evaluation  Patient identified by MRN, date of birth, ID band Patient awake    Reviewed: Allergy & Precautions, NPO status , Patient's Chart, lab work & pertinent test results, reviewed documented beta blocker date and time   Airway Mallampati: II  TM Distance: >3 FB Neck ROM: Full    Dental  (+) Teeth Intact, Dental Advisory Given, Implants   Pulmonary former smoker,    Pulmonary exam normal breath sounds clear to auscultation       Cardiovascular hypertension, Pt. on home beta blockers (-) angina+ CAD  (-) Past MI and (-) CHF Normal cardiovascular exam+ dysrhythmias (LBBB)  Rhythm:Regular Rate:Normal  Echo 12/24/15: Study Conclusions  - Left ventricle: The cavity size was normal. There was mild focal basal hypertrophy of the septum. Systolic function was normal. The estimated ejection fraction was in the range of 55% to 60%. Wall motion was normal; there were no regional wall motion abnormalities. Doppler parameters are consistent with abnormal left ventricular relaxation (grade 1 diastolic dysfunction). - Aortic valve: There was trivial regurgitation. - Mitral valve: There was mild regurgitation. - Left atrium: The atrium was moderately dilated. - Right ventricle: The cavity size was normal. Wall thickness was normal. Systolic function was normal.   Neuro/Psych  Neuromuscular disease negative psych ROS   GI/Hepatic Neg liver ROS, hiatal hernia, GERD  Medicated,  Endo/Other  Hypothyroidism Obesity   Renal/GU negative Renal ROS   Interstitial cystitis     Musculoskeletal  (+) Arthritis , Osteoarthritis,    Abdominal   Peds  Hematology negative hematology ROS (+)   Anesthesia Other Findings Day of surgery medications reviewed with the patient.  Reproductive/Obstetrics                           Anesthesia Physical Anesthesia Plan  ASA: III  Anesthesia Plan: General    Post-op Pain Management:    Induction: Intravenous  Airway Management Planned: Oral ETT  Additional Equipment:   Intra-op Plan:   Post-operative Plan: Extubation in OR  Informed Consent: I have reviewed the patients History and Physical, chart, labs and discussed the procedure including the risks, benefits and alternatives for the proposed anesthesia with the patient or authorized representative who has indicated his/her understanding and acceptance.   Dental advisory given  Plan Discussed with: CRNA  Anesthesia Plan Comments: (Risks/benefits of general anesthesia discussed with patient including risk of damage to teeth, lips, gum, and tongue, nausea/vomiting, allergic reactions to medications, and the possibility of heart attack, stroke and death.  All patient questions answered.  Patient wishes to proceed.)        Anesthesia Quick Evaluation

## 2016-12-12 NOTE — Anesthesia Procedure Notes (Signed)
Procedure Name: Intubation Date/Time: 12/12/2016 10:43 AM Performed by: Lieutenant Diego Pre-anesthesia Checklist: Patient identified, Emergency Drugs available, Suction available and Patient being monitored Patient Re-evaluated:Patient Re-evaluated prior to inductionOxygen Delivery Method: Circle system utilized Preoxygenation: Pre-oxygenation with 100% oxygen Intubation Type: IV induction Ventilation: Mask ventilation without difficulty Laryngoscope Size: Miller and 2 Grade View: Grade II Tube type: Oral Tube size: 8.0 mm Number of attempts: 1 Airway Equipment and Method: Stylet and Oral airway Placement Confirmation: ETT inserted through vocal cords under direct vision,  positive ETCO2 and breath sounds checked- equal and bilateral Secured at: 24 cm Tube secured with: Tape Dental Injury: Teeth and Oropharynx as per pre-operative assessment

## 2016-12-12 NOTE — Discharge Instructions (Addendum)

## 2016-12-12 NOTE — Anesthesia Postprocedure Evaluation (Signed)
Anesthesia Post Note  Patient: David Gutierrez  Procedure(s) Performed: Procedure(s) (LRB): RIGHT ENDOSCOPIC ETHMOIDECTOMY (Right) RIGHT ENDOSCOPIC MAXILLARY ANTROSTOMY (Right) RIGHT ENDOSCOPIC SPHENOIDECTOMY (Right) ENDOSCOPIC SINUS SURGERY WITH NAVIGATION (Right)  Patient location during evaluation: PACU Anesthesia Type: General Level of consciousness: awake and alert Pain management: pain level controlled Vital Signs Assessment: post-procedure vital signs reviewed and stable Respiratory status: spontaneous breathing, nonlabored ventilation, respiratory function stable and patient connected to nasal cannula oxygen Cardiovascular status: blood pressure returned to baseline and stable Postop Assessment: no signs of nausea or vomiting Anesthetic complications: no       Last Vitals:  Vitals:   12/12/16 1256 12/12/16 1315  BP:  (!) 150/67  Pulse: 68 68  Resp: 16 14  Temp:  36.5 C    Last Pain:  Vitals:   12/12/16 1315  TempSrc:   PainSc: 3                  Catalina Gravel

## 2016-12-12 NOTE — H&P (Signed)
Cc: Right chronic rhinosinusitis  HPI: The patient is a 83 y/o male who returns today for follow up evaluation of chronic post nasal drainage. The patient was last seen 2 weeks ago.  At that time, he was noted to have thick post nasal drainage and nasal mucosa congestion. The patient was treated with mucinex and nasal saline irrigation. He was previously treated with 4 courses of antibiotics, including a 21-day course of Bactrim.  Despite the treatment, he continues to be symptomatic. He underwent a sinus CT scan, which showed near complete opacification of his right maxillary sinus. Mucosal disease was also noted in the right ethmoid and sphenoid sinuses.  Exam: The nasal cavities were decongested and anesthetised with a combination of oxymetazoline and 4% lidocaine solution.  The flexible scope was inserted into the right nasal cavity.  Endoscopy of the inferior and middle meatus was performed.  Edematous and erythematous mucosa was noted.  No polyp, mass, or lesion was appreciated.  Olfactory cleft was clear.  Nasopharynx was clear.  Turbinates were hypertrophied but without mass.  Incomplete response to decongestion.  The procedure was repeated on the contralateral side. No significant disease was noted on the left.  The patient tolerated the procedure well.  Instructions were given to avoid eating or drinking for 2 hours.    Assessment:  1. The patient's history and physical exam findings are consistent with chronic right sided pan-sinusitis. His infection has not responded to treatment with multiple long-term antibiotics. 2. Chronic post nasal drainage.  3. His CT also showed possible odontogenic infection associated with his dental implant.   Plan: 1. The physical exam and nasal endoscopy findings are reviewed with the patient.  2. The CT images are also reviewed. 3. In light of the above findings, the patient will likely benefit from undergoing right endoscopic surgery to treat his maxillary,  ethmoid, and sphenoid sinuses. 4. The risks, benefits, details, and alternatives of the procedures are discussed with the patient. Questions are invited and answered.  5. The patient would like to proceed with the procedure.

## 2016-12-12 NOTE — Transfer of Care (Signed)
Immediate Anesthesia Transfer of Care Note  Patient: David Gutierrez  Procedure(s) Performed: Procedure(s): RIGHT ENDOSCOPIC ETHMOIDECTOMY (Right) RIGHT ENDOSCOPIC MAXILLARY ANTROSTOMY (Right) RIGHT ENDOSCOPIC SPHENOIDECTOMY (Right) ENDOSCOPIC SINUS SURGERY WITH NAVIGATION (Right)  Patient Location: PACU  Anesthesia Type:General  Level of Consciousness: awake  Airway & Oxygen Therapy: Patient Spontanous Breathing and Patient connected to face mask oxygen  Post-op Assessment: Report given to RN and Post -op Vital signs reviewed and stable  Post vital signs: Reviewed and stable  Last Vitals:  Vitals:   12/12/16 0942  BP: (!) 143/88  Pulse: (!) 58  Resp: 20  Temp: 36.4 C    Last Pain:  Vitals:   12/12/16 0942  TempSrc: Oral         Complications: No apparent anesthesia complications

## 2016-12-12 NOTE — Op Note (Signed)
DATE OF PROCEDURE: 12/12/2016  OPERATIVE REPORT   SURGEON: [name], MD   PREOPERATIVE DIAGNOSES:  1. Chronic right maxillary, ethmoid, and sphenoid sinusitis  POSTOPERATIVE DIAGNOSES:  1. Chronic right maxillary, ethmoid, and sphenoid sinusitis 2. Right sinonasal polyps  PROCEDURE PERFORMED:  1. Right endoscopic total ethmoidectomy and sphenoidectomy with tissue removal. 2. Right endoscopic maxillary antrostomy with tissue removal. 3. FUSION stereotactic image guidance  ANESTHESIA: General endotracheal tube anesthesia.   COMPLICATIONS: None.   ESTIMATED BLOOD LOSS: Approximately 100 mL.   INDICATION FOR PROCEDURE: David Gutierrez is a 83 y.o. male with a history of chronic right-sided rhinosinusitis and purulent drainage. He was previously treated with multiple courses of antibiotics, including a 21 day course of Bactrim. Despite his treatment, he continued to have purulent and foul-smelling drainage from his right nasal cavity. On his CT scan, the patient was noted to have complete opacification of his right maxillary sinus. His right ethmoid and sphenoid sinuses were also partially opacified. Based on the above findings, the decision was made for the patient to undergo the above-stated procedures. The risks, benefits, alternatives, and details of the procedure were discussed with the patient. Questions were invited and answered. Informed consent was obtained.   DESCRIPTION OF PROCEDURE: The patient was taken to the operating room and placed supine on the operating table. General endotracheal tube anesthesia was administered by the anesthesiologist. The patient was positioned, and prepped and draped in the standard fashion for nasal surgery. Pledgets soaked with Afrin were placed in both nasal cavities for decongestion. The pledgets were subsequently removed.   The right nasal cavity was examined with a 0 endoscope. The right middle turbinate was carefully medialized. A large amount of  purulent drainage was noted from the right maxillary and ethmoid sinuses. The uncinate process was resected with a freer elevator. The right maxillary antrum was entered and enlarged using a combination of backbiter, Tru-Cut forceps, and microdebrider. Polypoid tissue was removed from the right maxillary sinus. Purulent drainage was also suctioned. The right maxillary sinus was copiously irrigated.   The right anterior and posterior ethmoid cavities were then entered. The bony partitions were removed. Polypoid mucosa was removed.   The anterior wall of the right sphenoid cavity was then taken down using a combination of rongeur, mushroom punch, and Tru-Cut forceps. Polypoid tissue was also removed from the sphenoid sinus.  All sinuses were copiously irrigated with saline solution. A Xerogel packing was placed.   The care of the patient was turned over to the anesthesiologist. The patient was awakened from anesthesia without difficulty. The patient was extubated and transferred to the recovery room in good condition.   OPERATIVE FINDINGS: Chronic right maxillary, ethmoid, and sphenoid sinusitis. Polypoid tissue was removed from all 3 sinuses.   SPECIMEN: Right sinonasal contents.  FOLLOWUP CARE: The patient be discharged home once he is awake and alert. The patient will be placed on Percocet 1-2 tablets p.o. q.6 hours p.r.n. pain, and amoxicillin 875 mg p.o. b.i.d. for 5 days. The patient will follow up in my office in approximately 1 week.   David Lang Raynelle Bring, MD

## 2016-12-13 ENCOUNTER — Encounter: Payer: Self-pay | Admitting: Internal Medicine

## 2016-12-13 ENCOUNTER — Encounter (HOSPITAL_BASED_OUTPATIENT_CLINIC_OR_DEPARTMENT_OTHER): Payer: Self-pay | Admitting: Otolaryngology

## 2016-12-16 ENCOUNTER — Other Ambulatory Visit: Payer: Self-pay | Admitting: Cardiology

## 2016-12-16 DIAGNOSIS — I428 Other cardiomyopathies: Secondary | ICD-10-CM

## 2016-12-16 DIAGNOSIS — I2510 Atherosclerotic heart disease of native coronary artery without angina pectoris: Secondary | ICD-10-CM

## 2016-12-16 DIAGNOSIS — E785 Hyperlipidemia, unspecified: Secondary | ICD-10-CM

## 2016-12-20 DIAGNOSIS — J323 Chronic sphenoidal sinusitis: Secondary | ICD-10-CM | POA: Diagnosis not present

## 2016-12-20 DIAGNOSIS — J322 Chronic ethmoidal sinusitis: Secondary | ICD-10-CM | POA: Diagnosis not present

## 2016-12-20 DIAGNOSIS — J320 Chronic maxillary sinusitis: Secondary | ICD-10-CM | POA: Diagnosis not present

## 2016-12-27 ENCOUNTER — Encounter: Payer: Self-pay | Admitting: Internal Medicine

## 2017-01-03 DIAGNOSIS — J322 Chronic ethmoidal sinusitis: Secondary | ICD-10-CM | POA: Diagnosis not present

## 2017-01-03 DIAGNOSIS — J323 Chronic sphenoidal sinusitis: Secondary | ICD-10-CM | POA: Diagnosis not present

## 2017-01-03 DIAGNOSIS — J320 Chronic maxillary sinusitis: Secondary | ICD-10-CM | POA: Diagnosis not present

## 2017-01-09 ENCOUNTER — Ambulatory Visit (INDEPENDENT_AMBULATORY_CARE_PROVIDER_SITE_OTHER): Payer: PPO | Admitting: *Deleted

## 2017-01-09 DIAGNOSIS — E538 Deficiency of other specified B group vitamins: Secondary | ICD-10-CM | POA: Diagnosis not present

## 2017-01-09 MED ORDER — CYANOCOBALAMIN 1000 MCG/ML IJ SOLN
1000.0000 ug | Freq: Once | INTRAMUSCULAR | Status: AC
  Administered 2017-01-09: 1000 ug via INTRAMUSCULAR

## 2017-01-10 ENCOUNTER — Other Ambulatory Visit: Payer: Self-pay | Admitting: Internal Medicine

## 2017-01-10 DIAGNOSIS — R1310 Dysphagia, unspecified: Secondary | ICD-10-CM

## 2017-01-10 DIAGNOSIS — K2100 Gastro-esophageal reflux disease with esophagitis, without bleeding: Secondary | ICD-10-CM

## 2017-02-06 ENCOUNTER — Ambulatory Visit (INDEPENDENT_AMBULATORY_CARE_PROVIDER_SITE_OTHER): Payer: PPO | Admitting: *Deleted

## 2017-02-06 DIAGNOSIS — E538 Deficiency of other specified B group vitamins: Secondary | ICD-10-CM | POA: Diagnosis not present

## 2017-02-06 MED ORDER — CYANOCOBALAMIN 1000 MCG/ML IJ SOLN
1000.0000 ug | Freq: Once | INTRAMUSCULAR | Status: AC
  Administered 2017-02-06: 1000 ug via INTRAMUSCULAR

## 2017-02-15 ENCOUNTER — Encounter: Payer: Self-pay | Admitting: Internal Medicine

## 2017-02-15 ENCOUNTER — Ambulatory Visit (AMBULATORY_SURGERY_CENTER): Payer: Self-pay | Admitting: *Deleted

## 2017-02-15 VITALS — Ht 72.0 in | Wt 215.0 lb

## 2017-02-15 DIAGNOSIS — Z86010 Personal history of colonic polyps: Secondary | ICD-10-CM

## 2017-02-15 MED ORDER — NA SULFATE-K SULFATE-MG SULF 17.5-3.13-1.6 GM/177ML PO SOLN: ORAL | 0 refills | Status: DC

## 2017-02-15 NOTE — Progress Notes (Signed)
Patient denies any allergies to eggs or soy. Patient denies any problems with anesthesia/sedation. Patient denies any oxygen use at home and does not take any diet/weight loss medications.  

## 2017-02-27 DIAGNOSIS — I10 Essential (primary) hypertension: Secondary | ICD-10-CM | POA: Diagnosis not present

## 2017-02-27 DIAGNOSIS — E784 Other hyperlipidemia: Secondary | ICD-10-CM | POA: Diagnosis not present

## 2017-02-27 DIAGNOSIS — Z125 Encounter for screening for malignant neoplasm of prostate: Secondary | ICD-10-CM | POA: Diagnosis not present

## 2017-03-01 ENCOUNTER — Encounter (HOSPITAL_COMMUNITY): Payer: Self-pay | Admitting: Family Medicine

## 2017-03-01 ENCOUNTER — Inpatient Hospital Stay (HOSPITAL_COMMUNITY)
Admission: EM | Admit: 2017-03-01 | Discharge: 2017-03-03 | DRG: 920 | Disposition: A | Discharge status: Home / Self Care | Payer: PPO | Attending: Internal Medicine | Admitting: Internal Medicine

## 2017-03-01 ENCOUNTER — Encounter: Payer: Self-pay | Admitting: Internal Medicine

## 2017-03-01 ENCOUNTER — Ambulatory Visit (AMBULATORY_SURGERY_CENTER): Payer: PPO | Admitting: Internal Medicine

## 2017-03-01 VITALS — BP 121/66 | HR 60 | Temp 97.5°F | Resp 20 | Ht 72.0 in | Wt 215.0 lb

## 2017-03-01 DIAGNOSIS — Z87891 Personal history of nicotine dependence: Secondary | ICD-10-CM

## 2017-03-01 DIAGNOSIS — D128 Benign neoplasm of rectum: Secondary | ICD-10-CM

## 2017-03-01 DIAGNOSIS — Z96651 Presence of right artificial knee joint: Secondary | ICD-10-CM | POA: Diagnosis not present

## 2017-03-01 DIAGNOSIS — K219 Gastro-esophageal reflux disease without esophagitis: Secondary | ICD-10-CM | POA: Diagnosis present

## 2017-03-01 DIAGNOSIS — D122 Benign neoplasm of ascending colon: Secondary | ICD-10-CM | POA: Diagnosis not present

## 2017-03-01 DIAGNOSIS — Z7982 Long term (current) use of aspirin: Secondary | ICD-10-CM | POA: Diagnosis not present

## 2017-03-01 DIAGNOSIS — K625 Hemorrhage of anus and rectum: Secondary | ICD-10-CM | POA: Diagnosis present

## 2017-03-01 DIAGNOSIS — Y848 Other medical procedures as the cause of abnormal reaction of the patient, or of later complication, without mention of misadventure at the time of the procedure: Secondary | ICD-10-CM | POA: Diagnosis not present

## 2017-03-01 DIAGNOSIS — I10 Essential (primary) hypertension: Secondary | ICD-10-CM | POA: Diagnosis not present

## 2017-03-01 DIAGNOSIS — Z79899 Other long term (current) drug therapy: Secondary | ICD-10-CM | POA: Diagnosis not present

## 2017-03-01 DIAGNOSIS — I428 Other cardiomyopathies: Secondary | ICD-10-CM

## 2017-03-01 DIAGNOSIS — D123 Benign neoplasm of transverse colon: Secondary | ICD-10-CM | POA: Diagnosis not present

## 2017-03-01 DIAGNOSIS — E039 Hypothyroidism, unspecified: Secondary | ICD-10-CM | POA: Diagnosis not present

## 2017-03-01 DIAGNOSIS — K621 Rectal polyp: Secondary | ICD-10-CM

## 2017-03-01 DIAGNOSIS — R030 Elevated blood-pressure reading, without diagnosis of hypertension: Secondary | ICD-10-CM | POA: Diagnosis not present

## 2017-03-01 DIAGNOSIS — Z8719 Personal history of other diseases of the digestive system: Secondary | ICD-10-CM

## 2017-03-01 DIAGNOSIS — D62 Acute posthemorrhagic anemia: Secondary | ICD-10-CM | POA: Diagnosis not present

## 2017-03-01 DIAGNOSIS — I2510 Atherosclerotic heart disease of native coronary artery without angina pectoris: Secondary | ICD-10-CM | POA: Diagnosis present

## 2017-03-01 DIAGNOSIS — Z96652 Presence of left artificial knee joint: Secondary | ICD-10-CM | POA: Diagnosis not present

## 2017-03-01 DIAGNOSIS — D124 Benign neoplasm of descending colon: Secondary | ICD-10-CM | POA: Diagnosis not present

## 2017-03-01 DIAGNOSIS — Z86010 Personal history of colonic polyps: Secondary | ICD-10-CM

## 2017-03-01 DIAGNOSIS — Z1211 Encounter for screening for malignant neoplasm of colon: Secondary | ICD-10-CM | POA: Diagnosis not present

## 2017-03-01 DIAGNOSIS — K640 First degree hemorrhoids: Secondary | ICD-10-CM | POA: Diagnosis not present

## 2017-03-01 DIAGNOSIS — K91840 Postprocedural hemorrhage and hematoma of a digestive system organ or structure following a digestive system procedure: Secondary | ICD-10-CM | POA: Diagnosis not present

## 2017-03-01 DIAGNOSIS — D129 Benign neoplasm of anus and anal canal: Secondary | ICD-10-CM

## 2017-03-01 DIAGNOSIS — D120 Benign neoplasm of cecum: Secondary | ICD-10-CM | POA: Diagnosis not present

## 2017-03-01 DIAGNOSIS — K5730 Diverticulosis of large intestine without perforation or abscess without bleeding: Secondary | ICD-10-CM | POA: Diagnosis not present

## 2017-03-01 DIAGNOSIS — K626 Ulcer of anus and rectum: Secondary | ICD-10-CM | POA: Diagnosis not present

## 2017-03-01 DIAGNOSIS — D696 Thrombocytopenia, unspecified: Secondary | ICD-10-CM | POA: Diagnosis present

## 2017-03-01 DIAGNOSIS — N3010 Interstitial cystitis (chronic) without hematuria: Secondary | ICD-10-CM | POA: Diagnosis present

## 2017-03-01 DIAGNOSIS — Z9861 Coronary angioplasty status: Secondary | ICD-10-CM

## 2017-03-01 HISTORY — DX: Personal history of other diseases of the digestive system: Z87.19

## 2017-03-01 LAB — CBC WITH DIFFERENTIAL/PLATELET
BASOS ABS: 0 10*3/uL (ref 0.0–0.1)
BASOS PCT: 1 %
EOS PCT: 3 %
Eosinophils Absolute: 0.2 10*3/uL (ref 0.0–0.7)
HEMATOCRIT: 36.4 % — AB (ref 39.0–52.0)
Hemoglobin: 12.6 g/dL — ABNORMAL LOW (ref 13.0–17.0)
Lymphocytes Relative: 29 %
Lymphs Abs: 1.8 10*3/uL (ref 0.7–4.0)
MCH: 31.5 pg (ref 26.0–34.0)
MCHC: 34.6 g/dL (ref 30.0–36.0)
MCV: 91 fL (ref 78.0–100.0)
MONO ABS: 0.6 10*3/uL (ref 0.1–1.0)
Monocytes Relative: 9 %
Neutro Abs: 3.7 10*3/uL (ref 1.7–7.7)
Neutrophils Relative %: 59 %
PLATELETS: 108 10*3/uL — AB (ref 150–400)
RBC: 4 MIL/uL — ABNORMAL LOW (ref 4.22–5.81)
RDW: 17.8 % — AB (ref 11.5–15.5)
WBC: 6.3 10*3/uL (ref 4.0–10.5)

## 2017-03-01 LAB — TYPE AND SCREEN
ABO/RH(D): O NEG
Antibody Screen: NEGATIVE

## 2017-03-01 LAB — BASIC METABOLIC PANEL
ANION GAP: 5 (ref 5–15)
BUN: 11 mg/dL (ref 6–20)
CALCIUM: 8.8 mg/dL — AB (ref 8.9–10.3)
CO2: 24 mmol/L (ref 22–32)
CREATININE: 0.66 mg/dL (ref 0.61–1.24)
Chloride: 110 mmol/L (ref 101–111)
GFR calc Af Amer: >60 mL/min (ref >60)
GLUCOSE: 95 mg/dL (ref 65–99)
Potassium: 4 mmol/L (ref 3.5–5.1)
Sodium: 139 mmol/L (ref 135–145)

## 2017-03-01 LAB — PROTIME-INR
INR: 1.09
Prothrombin Time: 14.1 seconds (ref 11.4–15.2)

## 2017-03-01 MED ORDER — SODIUM CHLORIDE 0.9 % IV SOLN: 500.0000 mL | INTRAVENOUS | Status: DC

## 2017-03-01 MED ORDER — SODIUM CHLORIDE 0.9 % IV SOLN
INTRAVENOUS | Status: DC
  Administered 2017-03-02 (×2): via INTRAVENOUS

## 2017-03-01 MED ORDER — SODIUM CHLORIDE 0.9 % IV BOLUS (SEPSIS)
500.0000 mL | Freq: Once | INTRAVENOUS | Status: AC
  Administered 2017-03-01: 500 mL via INTRAVENOUS

## 2017-03-01 NOTE — Progress Notes (Signed)
Pt's states no medical or surgical changes since previsit or office visit. 

## 2017-03-01 NOTE — Progress Notes (Signed)
Called to room to assist during endoscopic procedure.  Patient ID and intended procedure confirmed with present staff. Received instructions for my participation in the procedure from the performing physician.  

## 2017-03-01 NOTE — Patient Instructions (Signed)
Handouts given on Polyps and diverticulosis  YOU HAD AN ENDOSCOPIC PROCEDURE TODAY: Refer to the procedure report and other information in the discharge instructions given to you for any specific questions about what was found during the examination. If this information does not answer your questions, please call [place] office at [phone] to clarify.   YOU SHOULD EXPECT: Some feelings of bloating in the abdomen. Passage of more gas than usual. Walking can help get rid of the air that was put into your GI tract during the procedure and reduce the bloating. If you had a lower endoscopy (such as a colonoscopy or flexible sigmoidoscopy) you may notice spotting of blood in your stool or on the toilet paper. Some abdominal soreness may be present for a day or two, also.  DIET: Your first meal following the procedure should be a light meal and then it is ok to progress to your normal diet. A half-sandwich or bowl of soup is an example of a good first meal. Heavy or fried foods are harder to digest and may make you feel nauseous or bloated. Drink plenty of fluids but you should avoid alcoholic beverages for 24 hours. If you had a esophageal dilation, please see attached instructions for diet.    ACTIVITY: Your care partner should take you home directly after the procedure. You should plan to take it easy, moving slowly for the rest of the day. You can resume normal activity the day after the procedure however YOU SHOULD NOT DRIVE, use power tools, machinery or perform tasks that involve climbing or major physical exertion for 24 hours (because of the sedation medicines used during the test).   SYMPTOMS TO REPORT IMMEDIATELY: A gastroenterologist can be reached at any hour. Please call [phone]  for any of the following symptoms:  Following lower endoscopy (colonoscopy, flexible sigmoidoscopy) Excessive amounts of blood in the stool  Significant tenderness, worsening of abdominal pains  Swelling of  the abdomen that is new, acute  Fever of 100 or higher    FOLLOW UP:  If any biopsies were taken you will be contacted by phone or by letter within the next 1-3 weeks. Call [phone]  if you have not heard about the biopsies in 3 weeks.  Please also call with any specific questions about appointments or follow up tests.

## 2017-03-01 NOTE — Op Note (Signed)
[place] Patient Name: David Gutierrez Procedure Date: 03/01/2017 2:22 PM MRN: [id number] Endoscopist: [name]. Henrene Pastor , MD Age: 83 Referring MD:  Date of Birth: 16-Oct-1933 Gender: Male Account #: [id number] Procedure:                Colonoscopy, with cold snare polypectomy x 6 Indications:              High risk colon cancer surveillance: Personal                            history of adenoma (10 mm or greater in size), High                            risk colon cancer surveillance: Personal history of                            multiple (3 or more) adenomas. Last examination                            08/03/2016 with multiple enlarged sessile lesions                            removed piecemeal Medicines:                Monitored Anesthesia Care Procedure:                Pre-Anesthesia Assessment:                           - Prior to the procedure, a History and Physical                            was performed, and patient medications and                            allergies were reviewed. The patient's tolerance of                            previous anesthesia was also reviewed. The risks                            and benefits of the procedure and the sedation                            options and risks were discussed with the patient.                            All questions were answered, and informed consent                            was obtained. Prior Anticoagulants: The patient has                            taken no previous anticoagulant or antiplatelet  agents. ASA Grade Assessment: II - A patient with                            mild systemic disease. After reviewing the risks                            and benefits, the patient was deemed in                            satisfactory condition to undergo the procedure.                           After obtaining informed consent, the colonoscope                            was passed under  direct vision. Throughout the                            procedure, the patient's blood pressure, pulse, and                            oxygen saturations were monitored continuously. The                            Colonoscope was introduced through the anus and                            advanced to the the cecum, identified by                            appendiceal orifice and ileocecal valve. The                            ileocecal valve, appendiceal orifice, and rectum                            were photographed. The quality of the bowel                            preparation was excellent. The colonoscopy was                            performed without difficulty. The patient tolerated                            the procedure well. The bowel preparation used was                            SUPREP. Scope In: 2:37:10 PM Scope Out: 3:05:22 PM Scope Withdrawal Time: 0 hours 17 minutes 6 seconds  Total Procedure Duration: 0 hours 28 minutes 12 seconds  Findings:                 Six sessile polyps were found in the rectum,  descending colon, transverse colon, ascending colon                            and cecum. The polyps were 5 to 10 mm in size.                            These polyps were removed with a cold snare.                            Resection and retrieval were complete.                           Diverticula were found in the left colon.                           Internal hemorrhoids were found during retroflexion.                           The exam was otherwise without abnormality on                            direct and retroflexion views. Complications:            No immediate complications. Estimated blood loss:                            None. Estimated Blood Loss:     Estimated blood loss: none. Impression:               - Six 5 to 10 mm polyps in the rectum, in the                            descending colon, in the transverse colon, in the                             ascending colon and in the cecum, removed with a                            cold snare. The 5 mm rectal lesion was oozing post                            resection and was passed up with snare tip for                            hemostasis. Resected and retrieved.                           - Diverticulosis in the left colon.                           - Internal hemorrhoids.                           - The examination was otherwise normal on direct  and retroflexion views. Recommendation:           - Repeat colonoscopy in 1 year for surveillance.                           - Patient has a contact number available for                            emergencies. The signs and symptoms of potential                            delayed complications were discussed with the                            patient. Return to normal activities tomorrow.                            Written discharge instructions were provided to the                            patient.                           - Resume previous diet.                           - Continue present medications.                           - Await pathology results. [name]. Henrene Pastor, MD 03/01/2017 3:16:17 PM This report has been signed electronically.

## 2017-03-01 NOTE — Progress Notes (Signed)
A and O x3. Report to RN. Tolerated MAC anesthesia well.

## 2017-03-01 NOTE — ED Provider Notes (Signed)
[facility] DEPT Provider Note   CSN: [id number] Arrival date & time: 03/01/17  2232  By signing my name below, I, Collene Leyden, attest that this documentation has been prepared under the direction and in the presence of Sherwood Gambler, MD. Electronically Signed: Collene Leyden, Scribe. 03/01/17. 11:29 PM.  History   Chief Complaint Chief Complaint  Patient presents with  . Rectal Bleeding    HPI Comments: David Gutierrez is a 83 y.o. male with a history of colon polyps and diverticulosis, who presents to the Emergency Department by ambulance, complaining of sudden-onset rectal bleeding s/p colonoscopy earlier today at [facility]. Patient had a colonoscopy by Dr. Henrene Pastor earlier today, in which the physician warned him that he may see some blood later in the day. The patient states he went home and he began having multiple large clots of blood from his rectum at 4PM. Patient reports having 5-7 episodes of rectal bleeding. Patient states he called the performing surgeon, in which he was advised to come here. Patient had 5 polyps removed during colonoscopy. Patient reports associated dehydration. Patient takes 324 mg of aspirin daily, did not take a dose today. No modifying factors indicated. Patient denies any rectal pain, abdominal pain, chest pain, abdominal distention, headache, fever, dizziness, or vomiting.   The history is provided by the patient. No language interpreter was used.    Past Medical History:  Diagnosis Date  . Arthritis   . CAD (coronary artery disease)    a. Myoview 11/13:  EF 38%, inf and IS defect c/w scar but no ischemia:  b. Cardiac CT 11/13:  Ca score 318 Agatson units, pLAD and dCFX plaque;   c. LHC 11/07/12:  pLAD 30%, oD1 40%, oCFX 30%, dCFX 40-50%, mRCA 40%, EF 50% (frequent PVCs and short run of NSVT with injection)  . Colon polyp    adenomatous  . Diverticulosis of colon (without mention of hemorrhage)   . Esophageal reflux   . External hemorrhoids   . Hiatal  hernia   . Hypogonadism male   . Interstitial cystitis   . Irritable bowel syndrome   . LBBB (left bundle branch block)   . NICM (nonischemic cardiomyopathy) ([place])    ? 2/2 LBBB => a. echo 11/13: diff HK, worse in septum and apex, mod LVE, mild LVH, EF 40%, mild AI, mild MR, mod LAE  . Personal history of colonic polyps 07/07/2003   adenomatous and hyperplastic   . Unspecified gastritis and gastroduodenitis without mention of hemorrhage     Patient Active Problem List   Diagnosis Date Noted  . Rectal bleeding 03/02/2017  . Alcoholic peripheral neuropathy ([place]) 11/07/2016  . B12 deficiency 11/07/2016  . S/P left TKA 10/14/2014  . DJD (degenerative joint disease) of knee 10/14/2014  . Hyperlipidemia 01/23/2013  . Coronary atherosclerosis of native coronary artery 11/20/2012  . NICM (nonischemic cardiomyopathy) ([place]) 11/20/2012  . LBBB (left bundle branch block) 10/09/2012  . Chest pain 10/09/2012    Past Surgical History:  Procedure Laterality Date  . CORONARY ANGIOPLASTY    . ETHMOIDECTOMY Right 12/12/2016   Procedure: RIGHT ENDOSCOPIC ETHMOIDECTOMY;  Surgeon: [name], MD;  Location: [facility];  Service: ENT;  Laterality: Right;  . [place]  . MAXILLARY ANTROSTOMY Right 12/12/2016   Procedure: RIGHT ENDOSCOPIC MAXILLARY ANTROSTOMY;  Surgeon: [name], MD;  Location: [place];  Service: ENT;  Laterality: Right;  . REPLACEMENT TOTAL KNEE     right  . SINUS ENDO  W/FUSION Right 12/12/2016   Procedure: ENDOSCOPIC SINUS SURGERY WITH NAVIGATION;  Surgeon: [name], MD;  Location: [place];  Service: ENT;  Laterality: Right;  . SPHENOIDECTOMY Right 12/12/2016   Procedure: RIGHT ENDOSCOPIC SPHENOIDECTOMY;  Surgeon: [name], MD;  Location: [facility] [place];  Service: ENT;  Laterality: Right;  . TONSILLECTOMY AND ADENOIDECTOMY    . TOTAL KNEE ARTHROPLASTY Left 10/14/2014   Procedure: LEFT TOTAL KNEE ARTHROPLASTY;  Surgeon:  [name], MD;  Location: [facility];  Service: Orthopedics;  Laterality: Left;  . TRANSURETHRAL RESECTION OF PROSTATE         Home Medications    Prior to Admission medications   Medication Sig Start Date End Date Taking? Authorizing Provider  aspirin EC 81 MG tablet Take 81 mg by mouth daily.   Yes Historical Provider, MD  bethanechol (URECHOLINE) 25 MG tablet Take 25 mg by mouth 2 (two) times daily.   Yes Historical Provider, MD  carvedilol (COREG) 6.25 MG tablet TAKE ONE AND ONE-HALF TABLET TWICE DAILY 12/16/16  Yes Larey Dresser, MD  diazepam (VALIUM) 5 MG tablet Take 2.5 mg by mouth 2 (two) times daily.  04/16/14  Yes Historical Provider, MD  diphenhydramine-acetaminophen (TYLENOL PM) 25-500 MG TABS tablet Take 1 tablet by mouth daily.   Yes Historical Provider, MD  levothyroxine (LEVOTHROID) 25 MCG tablet Take 25 mcg by mouth daily. 12/28/15  Yes Larey Dresser, MD  Misc Natural Products (COLON CLEANSER PO) Take 3 capsules by mouth 2 (two) times daily.     Yes Historical Provider, MD  omeprazole (PRILOSEC) 20 MG capsule TAKE ONE CAPSULE BY MOUTH ONCE DAILY 01/10/17  Yes Irene Shipper, MD  rosuvastatin (CRESTOR) 5 MG tablet Take 5 mg by mouth daily.   Yes Historical Provider, MD  thiamine (VITAMIN B-1) 100 MG tablet Take 1 tablet (100 mg total) by mouth daily. 11/14/16  Yes Cameron Sprang, MD    Family History Family History  Problem Relation Age of Onset  . Thyroid cancer Brother   . Healthy Mother   . Healthy Father   . Other Sister   . Heart disease Brother   . Healthy Sister   . Healthy Sister   . Diabetes type I Son   . Colon cancer Neg Hx     Social History Social History  Substance Use Topics  . Smoking status: Former Research scientist (life sciences)  . Smokeless tobacco: Never Used  . Alcohol use 0.0 oz/week     Comment: 2-3 drinks per day      Allergies   Cyclobenzaprine; Lisinopril; Relafen [nabumetone]; and Statins   Review of Systems Review of Systems  Constitutional:  Negative for fever.  Cardiovascular: Negative for chest pain.  Gastrointestinal: Positive for anal bleeding. Negative for abdominal distention, abdominal pain, diarrhea and rectal pain.  Neurological: Negative for dizziness and headaches.  All other systems reviewed and are negative.    Physical Exam Updated Vital Signs BP (!) 131/113   Pulse 72   Temp 97.9 F (36.6 C) (Oral)   Resp (!) 22   Ht 6' (1.829 m)   Wt 215 lb (97.5 kg)   SpO2 97%   BMI 29.16 kg/m   Physical Exam  Constitutional: He is oriented to person, place, and time. He appears well-developed and well-nourished.  HENT:  Head: Normocephalic and atraumatic.  Right Ear: External ear normal.  Left Ear: External ear normal.  Nose: Nose normal.  Eyes: Right eye exhibits no discharge. Left  eye exhibits no discharge.  Neck: Neck supple.  Cardiovascular: Normal rate, regular rhythm and normal heart sounds.   Pulmonary/Chest: Effort normal and breath sounds normal.  Abdominal: Soft. There is no tenderness.  Genitourinary:  Genitourinary Comments: No external hemorrhoids. Benign rectal exam with no masses or lesions. No significant tenderness.   Musculoskeletal: He exhibits no edema.  Neurological: He is alert and oriented to person, place, and time.  Skin: Skin is warm and dry.  Nursing note and vitals reviewed.    ED Treatments / Results  DIAGNOSTIC STUDIES: Oxygen Saturation is 95% on RA, normal by my interpretation.    COORDINATION OF CARE: 11:29 PM Discussed treatment plan with pt at bedside and pt agreed to plan, which includes lab work.   Labs (all labs ordered are listed, but only abnormal results are displayed) Labs Reviewed  CBC WITH DIFFERENTIAL/PLATELET - Abnormal; Notable for the following:       Result Value   RBC 4.00 (*)    Hemoglobin 12.6 (*)    HCT 36.4 (*)    RDW 17.8 (*)    Platelets 108 (*)    All other components within normal limits  BASIC METABOLIC PANEL - Abnormal; Notable for  the following:    Calcium 8.8 (*)    All other components within normal limits  PROTIME-INR  TYPE AND SCREEN    EKG  EKG Interpretation None       Radiology Dg Abdomen 1 View  Result Date: 03/02/2017 CLINICAL DATA:  83 y/o M; colonoscopy 03/01/2017 with polyps removed. Rectal bleeding. EXAM: ABDOMEN - 1 VIEW COMPARISON:  None. FINDINGS: The bowel gas pattern is normal. No radio-opaque calculi or other significant radiographic abnormality are seen. No pneumoperitoneum identified. Advanced degenerative changes of the lumbar spine. IMPRESSION: Normal bowel gas pattern.  No pneumoperitoneum identified. Electronically Signed   By: Kristine Garbe M.D.   On: 03/02/2017 01:07    Procedures Procedures (including critical care time)  Medications Ordered in ED Medications  0.9 %  sodium chloride infusion ( Intravenous New Bag/Given 03/02/17 0021)  sodium chloride 0.9 % bolus 500 mL (0 mLs Intravenous Stopped 03/02/17 0021)     Initial Impression / Assessment and Plan / ED Course  I have reviewed the triage vital signs and the nursing notes.  Pertinent labs & imaging results that were available during my care of the patient were reviewed by me and considered in my medical decision making (see chart for details).  Clinical Course as of Mar 03 415  Wed Mar 01, 2017  2331 No abd pain/tenderness. Doubt perforation. Will get KUB to eval for free air but more likely this is due to the polyp removal with post op bleeding.  [SG]    Clinical Course User Index [SG] Sherwood Gambler, MD    Patient's vital signs are stable here. However given his continued intermittent bleeding status post a colonoscopy, will admit to the hospitalist service for monitoring. He will be Nothing by mouth. Discussed with Dr. Ardis Hughs of GI, plan is for likely colonoscopy in the morning. Abdominal exam is benign, I highly doubt perforation.  Final Clinical Impressions(s) / ED Diagnoses   Final diagnoses:    Rectal bleeding    New Prescriptions New Prescriptions   No medications on file   I personally performed the services described in this documentation, which was scribed in my presence. The recorded information has been reviewed and is accurate.     Sherwood Gambler, MD 03/02/17 [phone]

## 2017-03-01 NOTE — ED Triage Notes (Signed)
Patient is from [facility] and transported via [facility]. Patient reports he had a colonoscopy today around 15:00. At 17:00, patient reports having rectal bleeding with finger tip size blood clots. Pt reports he has went to the restroom numerous times. Pt denies being dizzy, headache, but reports lightheaded.

## 2017-03-02 ENCOUNTER — Encounter (HOSPITAL_COMMUNITY): Payer: Self-pay | Admitting: Family Medicine

## 2017-03-02 ENCOUNTER — Encounter (HOSPITAL_COMMUNITY)
Admission: EM | Disposition: A | Discharge status: Home / Self Care | Payer: Self-pay | Source: Home / Self Care | Attending: Internal Medicine

## 2017-03-02 ENCOUNTER — Emergency Department (HOSPITAL_COMMUNITY): Payer: PPO

## 2017-03-02 ENCOUNTER — Telehealth: Payer: Self-pay | Admitting: *Deleted

## 2017-03-02 DIAGNOSIS — Z96651 Presence of right artificial knee joint: Secondary | ICD-10-CM | POA: Diagnosis present

## 2017-03-02 DIAGNOSIS — Z79899 Other long term (current) drug therapy: Secondary | ICD-10-CM | POA: Diagnosis not present

## 2017-03-02 DIAGNOSIS — D62 Acute posthemorrhagic anemia: Secondary | ICD-10-CM | POA: Diagnosis not present

## 2017-03-02 DIAGNOSIS — Y848 Other medical procedures as the cause of abnormal reaction of the patient, or of later complication, without mention of misadventure at the time of the procedure: Secondary | ICD-10-CM | POA: Diagnosis present

## 2017-03-02 DIAGNOSIS — E039 Hypothyroidism, unspecified: Secondary | ICD-10-CM | POA: Diagnosis present

## 2017-03-02 DIAGNOSIS — K626 Ulcer of anus and rectum: Secondary | ICD-10-CM | POA: Diagnosis not present

## 2017-03-02 DIAGNOSIS — I10 Essential (primary) hypertension: Secondary | ICD-10-CM | POA: Diagnosis present

## 2017-03-02 DIAGNOSIS — K219 Gastro-esophageal reflux disease without esophagitis: Secondary | ICD-10-CM | POA: Diagnosis present

## 2017-03-02 DIAGNOSIS — K91840 Postprocedural hemorrhage and hematoma of a digestive system organ or structure following a digestive system procedure: Secondary | ICD-10-CM | POA: Diagnosis present

## 2017-03-02 DIAGNOSIS — N3010 Interstitial cystitis (chronic) without hematuria: Secondary | ICD-10-CM | POA: Diagnosis present

## 2017-03-02 DIAGNOSIS — Z9861 Coronary angioplasty status: Secondary | ICD-10-CM | POA: Diagnosis not present

## 2017-03-02 DIAGNOSIS — Z86010 Personal history of colonic polyps: Secondary | ICD-10-CM | POA: Diagnosis not present

## 2017-03-02 DIAGNOSIS — I2510 Atherosclerotic heart disease of native coronary artery without angina pectoris: Secondary | ICD-10-CM

## 2017-03-02 DIAGNOSIS — K5730 Diverticulosis of large intestine without perforation or abscess without bleeding: Secondary | ICD-10-CM | POA: Diagnosis present

## 2017-03-02 DIAGNOSIS — Z96652 Presence of left artificial knee joint: Secondary | ICD-10-CM | POA: Diagnosis present

## 2017-03-02 DIAGNOSIS — I428 Other cardiomyopathies: Secondary | ICD-10-CM

## 2017-03-02 DIAGNOSIS — D696 Thrombocytopenia, unspecified: Secondary | ICD-10-CM | POA: Diagnosis present

## 2017-03-02 DIAGNOSIS — Z7982 Long term (current) use of aspirin: Secondary | ICD-10-CM | POA: Diagnosis not present

## 2017-03-02 DIAGNOSIS — K625 Hemorrhage of anus and rectum: Secondary | ICD-10-CM

## 2017-03-02 DIAGNOSIS — K640 First degree hemorrhoids: Secondary | ICD-10-CM | POA: Diagnosis present

## 2017-03-02 DIAGNOSIS — Z87891 Personal history of nicotine dependence: Secondary | ICD-10-CM | POA: Diagnosis not present

## 2017-03-02 HISTORY — PX: FLEXIBLE SIGMOIDOSCOPY: SHX5431

## 2017-03-02 LAB — CBC
HCT: 34.5 % — ABNORMAL LOW (ref 39.0–52.0)
HEMATOCRIT: 31.5 % — AB (ref 39.0–52.0)
Hemoglobin: 11.5 g/dL — ABNORMAL LOW (ref 13.0–17.0)
Hemoglobin: 10.9 g/dL — ABNORMAL LOW (ref 13.0–17.0)
MCH: 31.6 pg (ref 26.0–34.0)
MCH: 31.5 pg (ref 26.0–34.0)
MCHC: 33.3 g/dL (ref 30.0–36.0)
MCHC: 34.6 g/dL (ref 30.0–36.0)
MCV: 94.8 fL (ref 78.0–100.0)
MCV: 91 fL (ref 78.0–100.0)
PLATELETS: 106 10*3/uL — AB (ref 150–400)
Platelets: 96 10*3/uL — ABNORMAL LOW (ref 150–400)
RBC: 3.64 MIL/uL — ABNORMAL LOW (ref 4.22–5.81)
RBC: 3.46 MIL/uL — AB (ref 4.22–5.81)
RDW: 18.1 % — ABNORMAL HIGH (ref 11.5–15.5)
RDW: 18 % — AB (ref 11.5–15.5)
WBC: 6.9 10*3/uL (ref 4.0–10.5)
WBC: 6.8 10*3/uL (ref 4.0–10.5)

## 2017-03-02 SURGERY — SIGMOIDOSCOPY, FLEXIBLE
Anesthesia: Moderate Sedation

## 2017-03-02 MED ORDER — SODIUM CHLORIDE 0.9 % IJ SOLN
PREFILLED_SYRINGE | INTRAMUSCULAR | Status: DC | PRN
  Administered 2017-03-02: 8 mL

## 2017-03-02 MED ORDER — CARVEDILOL 6.25 MG PO TABS
9.3750 mg | ORAL_TABLET | Freq: Two times a day (BID) | ORAL | Status: DC
  Administered 2017-03-02 – 2017-03-03 (×2): 9.375 mg via ORAL
  Filled 2017-03-02 (×2): qty 1

## 2017-03-02 MED ORDER — LACTATED RINGERS IV SOLN
INTRAVENOUS | Status: DC
  Administered 2017-03-02: 1000 mL via INTRAVENOUS

## 2017-03-02 MED ORDER — MIDAZOLAM HCL 5 MG/5ML IJ SOLN
INTRAMUSCULAR | Status: DC | PRN
  Administered 2017-03-02: 2 mg via INTRAVENOUS
  Administered 2017-03-02: 1 mg via INTRAVENOUS
  Administered 2017-03-02: 2 mg via INTRAVENOUS

## 2017-03-02 MED ORDER — DIAZEPAM 5 MG PO TABS
2.5000 mg | ORAL_TABLET | Freq: Two times a day (BID) | ORAL | Status: DC
  Administered 2017-03-02: 2.5 mg via ORAL
  Filled 2017-03-02: qty 1

## 2017-03-02 MED ORDER — LEVOTHYROXINE SODIUM 25 MCG PO TABS
25.0000 ug | ORAL_TABLET | Freq: Every day | ORAL | Status: DC
  Administered 2017-03-02 – 2017-03-03 (×2): 25 ug via ORAL
  Filled 2017-03-02 (×2): qty 1

## 2017-03-02 MED ORDER — MIDAZOLAM HCL 5 MG/ML IJ SOLN
INTRAMUSCULAR | Status: AC
  Filled 2017-03-02: qty 2

## 2017-03-02 MED ORDER — EPINEPHRINE PF 1 MG/10ML IJ SOSY
PREFILLED_SYRINGE | INTRAMUSCULAR | Status: AC
  Filled 2017-03-02: qty 10

## 2017-03-02 MED ORDER — ACETAMINOPHEN 325 MG PO TABS
650.0000 mg | ORAL_TABLET | Freq: Four times a day (QID) | ORAL | Status: DC | PRN
Start: 2017-03-02 — End: 2017-03-03

## 2017-03-02 MED ORDER — FENTANYL CITRATE (PF) 100 MCG/2ML IJ SOLN
INTRAMUSCULAR | Status: AC
  Filled 2017-03-02: qty 2

## 2017-03-02 MED ORDER — BETHANECHOL CHLORIDE 25 MG PO TABS
25.0000 mg | ORAL_TABLET | Freq: Two times a day (BID) | ORAL | Status: DC
  Administered 2017-03-02 – 2017-03-03 (×2): 25 mg via ORAL
  Filled 2017-03-02 (×3): qty 1

## 2017-03-02 MED ORDER — ACETAMINOPHEN 650 MG RE SUPP: 650.0000 mg | Freq: Four times a day (QID) | RECTAL | Status: DC | PRN

## 2017-03-02 MED ORDER — ONDANSETRON HCL 4 MG PO TABS: 4.0000 mg | ORAL_TABLET | Freq: Four times a day (QID) | ORAL | Status: DC | PRN

## 2017-03-02 MED ORDER — SODIUM CHLORIDE 0.9 % IV SOLN
INTRAVENOUS | Status: DC
  Administered 2017-03-02 – 2017-03-03 (×2): via INTRAVENOUS

## 2017-03-02 MED ORDER — ROSUVASTATIN CALCIUM 5 MG PO TABS
5.0000 mg | ORAL_TABLET | Freq: Every day | ORAL | Status: DC
  Filled 2017-03-02: qty 1

## 2017-03-02 MED ORDER — FENTANYL CITRATE (PF) 100 MCG/2ML IJ SOLN
INTRAMUSCULAR | Status: DC | PRN
  Administered 2017-03-02 (×3): 25 ug via INTRAVENOUS

## 2017-03-02 MED ORDER — ONDANSETRON HCL 4 MG/2ML IJ SOLN: 4.0000 mg | Freq: Four times a day (QID) | INTRAMUSCULAR | Status: DC | PRN

## 2017-03-02 NOTE — Op Note (Addendum)
[facility] Patient Name: David Gutierrez Procedure Date: 03/02/2017 MRN: [id number] Attending MD: Ladene Artist , MD Date of Birth: 15-Feb-1933 CSN: [id number] Age: 83 Admit Type: Inpatient Procedure:                Flexible Sigmoidoscopy Indications:              Hematochezia Providers:                Pricilla Riffle. Fuller Plan, MD, Hilma Favors, RN, Cherylynn Ridges, Technician Referring MD:             [facility] Medicines:                Fentanyl 75 micrograms IV, Midazolam 5 mg IV Complications:            No immediate complications. Estimated Blood Loss:     Estimated blood loss was minimal. Procedure:                Pre-Anesthesia Assessment:                           - Prior to the procedure, a History and Physical                            was performed, and patient medications and                            allergies were reviewed. The patient's tolerance of                            previous anesthesia was also reviewed. The risks                            and benefits of the procedure and the sedation                            options and risks were discussed with the patient.                            All questions were answered, and informed consent                            was obtained. Prior Anticoagulants: The patient has                            taken no previous anticoagulant or antiplatelet                            agents. ASA Grade Assessment: II - A patient with                            mild systemic disease. After reviewing the risks  and benefits, the patient was deemed in                            satisfactory condition to undergo the procedure.                           After obtaining informed consent, the scope was                            passed under direct vision. The EC-[id number] ([id number])                            scope was introduced through the anus and advanced                  to the the sigmoid colon. After obtaining informed                            consent, the scope was passed under direct                            vision.The flexible sigmoidoscopy was accomplished                            without difficulty. The patient tolerated the                            procedure well. The quality of the bowel                            preparation was poor with clots and stool obscuring                            at least 50% of the lumen. Scope In: Scope Out: Findings:      The perianal and digital rectal examinations were normal.      A single (solitary) eight mm ulcer was found in the rectum with a large       clot and active bleeding was present. This corresponded to the rectal       polypectomy site. Stigmata of recent/ongoing bleeding were present. 8 cc       of epinephrine injected around ulcer with persistent bleeding. The clot       was gently removed and bleeding persisted. To stop active bleeding,       three hemostatic clips were successfully placed (MR conditional). There       was no bleeding at the end of the procedure.      A few medium-mouthed diverticula were found in the sigmoid colon. There       was no evidence of diverticular bleeding.      Internal hemorrhoids were found during retroflexion. The hemorrhoids       were small and Grade I (internal hemorrhoids that do not prolapse). Impression:               - Preparation of the colon was poor.                           -  A single, actively bleeding ulcer in the rectum.                            3 clips (MR conditional) were placed. Epi injected.                            Hemostasis achieved.                           - Sigmoid colon diverticulosis.                           - Internal hemorrhoids.                           - No specimens collected. Moderate Sedation:      Moderate (conscious) sedation was administered by the endoscopy nurse       and supervised by the  endoscopist. The following parameters were       monitored: oxygen saturation, heart rate, blood pressure, respiratory       rate, EKG, adequacy of pulmonary ventilation, and response to care.       Total physician intraservice time was 20 minutes. Recommendation:           - Return patient to hospital ward for ongoing care.                           - Full liquid diet today.                           - Observe overnight for recurrent bleeding.                           - No ASA/NSAIDs/anticoagulants for 3 weeks.                           - Office appt in 2 weeks. Procedure Code(s):        --- Professional ---                           [phone], Sigmoidoscopy, flexible; with control of                            bleeding, any method                           99152, Moderate sedation services provided by the                            same physician or other qualified health care                            professional performing the diagnostic or                            therapeutic service that the sedation supports,  requiring the presence of an independent trained                            observer to assist in the monitoring of the                            patient's level of consciousness and physiological                            status; initial 15 minutes of intraservice time,                            patient age 87 years or older Diagnosis Code(s):        --- Professional ---                           K62.6, Ulcer of anus and rectum                           K92.1, Melena (includes Hematochezia) CPT copyright 2016 American Medical Association. All rights reserved. The codes documented in this report are preliminary and upon coder review may  be revised to meet current compliance requirements. Ladene Artist, MD 03/02/2017 2:18:33 PM This report has been signed electronically. Number of Addenda: 0

## 2017-03-02 NOTE — Progress Notes (Signed)
Patient was admitted early this AM after midnight and H and P has been reviewed and I am in current agreement with the Assessment and Plan done by Dr. Loleta Books. Patient is a 83 year old Caucasian Male with a PMH of HTN, NICM with EF of 40% without CHF, Hypothyroidism, GERD, Intersitial Cystitis, Peripheral Neuropathy and other comorbids who presented to [facility] with a cc of rectal bleeding s/p colonoscopy. He underwent colonoscopy early and had 6 sessile polyps removed with a cold snare with one in the transverse colon oozing requiring some hemostasis. He presented with post polypectomy bleeding and Gastroenterology was consulted and patient is to undergo Flex Sigmoidoscopy this afternoon. We will continue to watch trend his Hb/Hct and monitor patient closely and repeat Bloodwork in AM.

## 2017-03-02 NOTE — H&P (Signed)
History and Physical  Patient Name: David Gutierrez     OXB:[id number]    DOB: 04/09/1933    DOA: 03/01/2017 PCP: David Lopes, MD   Patient coming from: Home  Chief Complaint: Rectal bleeding post-colonoscopy  HPI: David Gutierrez is a 83 y.o. male with a past medical history significant for HTN, NICM EF 40% without CHF, and hypothyroidism who presents with rectal bleeding post-colonoscopy.  The patient underwent routine (high risk) screening colonoscopy this mroning.  Had 6 sessile polyps removed with cold snare, one in transverse colon was oozing and required some hemostasis, all per procedure report.  The patient went home, had some eggs and grits, but by this evening, he started to have painless, bright red rectal bleeding with bowel movements.  He had about 5 BR BMs at home, then came tot he ER, where he had several more.  ED course: -Afebrile, heart rate 66, respirations and pulse ox normal, blood pressure 120/60 -Na 139, K 4.0, Cr 0.66, WBC 6.3K, Hgb 12.6 -Platelets mildly low 100K -He was no orthostatic, but had continued bright red bowel movements in the ER -Case was discussed with GI on call who recommended observation, trend CBC and probably repeat endoscopy tomorrow AM     ROS: Review of Systems  Gastrointestinal: Positive for blood in stool. Negative for abdominal pain, constipation, diarrhea, melena, nausea and vomiting.  Neurological: Negative for dizziness and loss of consciousness.  All other systems reviewed and are negative.         Past Medical History:  Diagnosis Date  . Arthritis   . CAD (coronary artery disease)    a. Myoview 11/13:  EF 38%, inf and IS defect c/w scar but no ischemia:  b. Cardiac CT 11/13:  Ca score 318 Agatson units, pLAD and dCFX plaque;   c. LHC 11/07/12:  pLAD 30%, oD1 40%, oCFX 30%, dCFX 40-50%, mRCA 40%, EF 50% (frequent PVCs and short run of NSVT with injection)  . Colon polyp    adenomatous  . Diverticulosis of colon (without  mention of hemorrhage)   . Esophageal reflux   . External hemorrhoids   . Hiatal hernia   . Hypogonadism male   . Interstitial cystitis   . Irritable bowel syndrome   . LBBB (left bundle branch block)   . NICM (nonischemic cardiomyopathy) ([place])    ? 2/2 LBBB => a. echo 11/13: diff HK, worse in septum and apex, mod LVE, mild LVH, EF 40%, mild AI, mild MR, mod LAE  . Personal history of colonic polyps 07/07/2003   adenomatous and hyperplastic   . Unspecified gastritis and gastroduodenitis without mention of hemorrhage     Past Surgical History:  Procedure Laterality Date  . CORONARY ANGIOPLASTY    . ETHMOIDECTOMY Right 12/12/2016   Procedure: RIGHT ENDOSCOPIC ETHMOIDECTOMY;  Surgeon: David Baptist, MD;  Location: [place] [facility];  Service: ENT;  Laterality: Right;  . [place]  . MAXILLARY ANTROSTOMY Right 12/12/2016   Procedure: RIGHT ENDOSCOPIC MAXILLARY ANTROSTOMY;  Surgeon: David Baptist, MD;  Location: [facility];  Service: ENT;  Laterality: Right;  . REPLACEMENT TOTAL KNEE     right  . SINUS ENDO W/FUSION Right 12/12/2016   Procedure: ENDOSCOPIC SINUS SURGERY WITH NAVIGATION;  Surgeon: David Baptist, MD;  Location: [place];  Service: ENT;  Laterality: Right;  . SPHENOIDECTOMY Right 12/12/2016   Procedure: RIGHT ENDOSCOPIC SPHENOIDECTOMY;  Surgeon: David Baptist, MD;  Location: [facility];  Service: ENT;  Laterality: Right;  . TONSILLECTOMY AND ADENOIDECTOMY    . TOTAL KNEE ARTHROPLASTY Left 10/14/2014   Procedure: LEFT TOTAL KNEE ARTHROPLASTY;  Surgeon: David Pole, MD;  Location: [facility];  Service: Orthopedics;  Laterality: Left;  . TRANSURETHRAL RESECTION OF PROSTATE      Social History: Patient lives with his wife at [facility].  The patient walks unassisted.  He is from [place], went to [employer].  He is a former smoker.  Retired from working in [employer] fundraiser.    Allergies  Allergen Reactions  .  Cyclobenzaprine     Lost balance  . Lisinopril     Affected ability to urinate  . Relafen [Nabumetone]     Problems urinating afterwards   . Statins Other (See Comments)    "my peeing"    Family history: family history includes Diabetes type I in his son; Healthy in his father, mother, sister, and sister; Heart disease in his brother; Other in his sister; Thyroid cancer in his brother.  Prior to Admission medications   Medication Sig Start Date End Date Taking? Authorizing Provider  aspirin EC 81 MG tablet Take 81 mg by mouth daily.   Yes Historical Provider, MD  bethanechol (URECHOLINE) 25 MG tablet Take 25 mg by mouth 2 (two) times daily.   Yes Historical Provider, MD  carvedilol (COREG) 6.25 MG tablet TAKE ONE AND ONE-HALF TABLET TWICE DAILY 12/16/16  Yes David Dresser, MD  diazepam (VALIUM) 5 MG tablet Take 2.5 mg by mouth 2 (two) times daily.  04/16/14  Yes Historical Provider, MD  diphenhydramine-acetaminophen (TYLENOL PM) 25-500 MG TABS tablet Take 1 tablet by mouth daily.   Yes Historical Provider, MD  levothyroxine (LEVOTHROID) 25 MCG tablet Take 25 mcg by mouth daily. 12/28/15  Yes David Dresser, MD  Misc Natural Products (COLON CLEANSER PO) Take 3 capsules by mouth 2 (two) times daily.     Yes Historical Provider, MD  omeprazole (PRILOSEC) 20 MG capsule TAKE ONE CAPSULE BY MOUTH ONCE DAILY 01/10/17  Yes David Shipper, MD  rosuvastatin (CRESTOR) 5 MG tablet Take 5 mg by mouth daily.   Yes Historical Provider, MD  thiamine (VITAMIN B-1) 100 MG tablet Take 1 tablet (100 mg total) by mouth daily. 11/14/16  Yes David Sprang, MD       Physical Exam: BP (!) 131/113   Pulse 72   Temp 97.9 F (36.6 C) (Oral)   Resp (!) 22   Ht 6' (1.829 m)   Wt 97.5 kg (215 lb)   SpO2 97%   BMI 29.16 kg/m  General appearance: Well-developed, elderly adult male, alert and in no acute distress.   Eyes: Anicteric, conjunctiva pink, lids and lashes normal. PERRL.    ENT: No nasal deformity,  discharge, epistaxis.  Hearing normal. OP moist without lesions.   Skin: Warm and dry.  No jaundice.  No suspicious rashes or lesions. Cardiac: RRR, nl S1-S2, no murmurs appreciated.  Capillary refill is brisk.  JVP not visible.  Trace nonpitting LE edema.  Radial and DP pulses 2+ and symmetric. Respiratory: Normal respiratory rate and rhythm.  CTAB without rales or wheezes. Abdomen: Abdomen soft.  No TTP. No ascites, distension, hepatosplenomegaly.   MSK: No deformities or effusions.  No cyanosis or clubbing. Neuro: Cranial nerves normal.  Sensation intact to light touch. Speech is fluent.  Muscle strength normal.    Psych: Sensorium intact and responding to questions, attention normal.  Behavior appropriate.  Affect normal.  Judgment and insight appear normal.     Labs on Admission:  I have personally reviewed following labs and imaging studies: CBC:  Recent Labs Lab 03/01/17 2311  WBC 6.3  NEUTROABS 3.7  HGB 12.6*  HCT 36.4*  MCV 91.0  PLT 563*   Basic Metabolic Panel:  Recent Labs Lab 03/01/17 2311  NA 139  K 4.0  CL 110  CO2 24  GLUCOSE 95  BUN 11  CREATININE 0.66  CALCIUM 8.8*   GFR: Estimated Creatinine Clearance: 87.7 mL/min (by C-G formula based on SCr of 0.66 mg/dL).  Liver Function Tests: No results for input(s): AST, ALT, ALKPHOS, BILITOT, PROT, ALBUMIN in the last 168 hours. No results for input(s): LIPASE, AMYLASE in the last 168 hours. No results for input(s): AMMONIA in the last 168 hours. Coagulation Profile:  Recent Labs Lab 03/01/17 2311  INR 1.09   Cardiac Enzymes: No results for input(s): CKTOTAL, CKMB, CKMBINDEX, TROPONINI in the last 168 hours. BNP (last 3 results) No results for input(s): PROBNP in the last 8760 hours. HbA1C: No results for input(s): HGBA1C in the last 72 hours. CBG: No results for input(s): GLUCAP in the last 168 hours. Lipid Profile: No results for input(s): CHOL, HDL, LDLCALC, TRIG, CHOLHDL, LDLDIRECT in the  last 72 hours. Thyroid Function Tests: No results for input(s): TSH, T4TOTAL, FREET4, T3FREE, THYROIDAB in the last 72 hours. Anemia Panel: No results for input(s): VITAMINB12, FOLATE, FERRITIN, TIBC, IRON, RETICCTPCT in the last 72 hours. Sepsis Labs: Invalid input(s): PROCALCITONIN, LACTICIDVEN No results found for this or any previous visit (from the past 240 hour(s)).       Radiological Exams on Admission: Personally reviewed AXR report: Dg Abdomen 1 View  Result Date: 03/02/2017 CLINICAL DATA:  83 y/o M; colonoscopy 03/01/2017 with polyps removed. Rectal bleeding. EXAM: ABDOMEN - 1 VIEW COMPARISON:  None. FINDINGS: The bowel gas pattern is normal. No radio-opaque calculi or other significant radiographic abnormality are seen. No pneumoperitoneum identified. Advanced degenerative changes of the lumbar spine. IMPRESSION: Normal bowel gas pattern.  No pneumoperitoneum identified. Electronically Signed   By: Kristine Garbe M.D.   On: 03/02/2017 01:07       Assessment/Plan Principal Problem:   Rectal bleeding Active Problems:   Coronary atherosclerosis of native coronary artery   NICM (nonischemic cardiomyopathy) ([place])  1. Rectal bleeding post-procedure:    -NPO -MIVF -Repeat CBC -Consult to GI, appreciate cares   2. Hypertension and NICM:  -Continue BB -Continue statin -Hold aspirin  3. Hypothyroidism:  -Continue levothryroxine  4. Other medicaitons:  -Continue bethanecol -Continue Valium  5. Thrombocytopenia:  Mild.        DVT prophylaxis: SCDs  Code Status: FULL  Family Communication: None present  Disposition Plan: Anticipate NPO and MIVF and consult to GI tomorrow, plan for possible endoscopy Consults called: GI Admission status: OBS At the point of initial evaluation, it is my clinical opinion that admission for OBSERVATION is reasonable and necessary because the patient's presenting complaints in the context of their chronic conditions  represent sufficient risk of deterioration or significant morbidity to constitute reasonable grounds for close observation in the hospital setting, but that the patient may be medically stable for discharge from the hospital within 24 to 48 hours.    Medical decision making: Patient seen at 2:57 AM on 03/02/2017.  The patient was discussed with Dr. Regenia Skeeter.  What exists of the patient's chart was reviewed in depth and summarized above.  Clinical condition: stable hemodynamically  the entire time he was in the ER and not symptomatic from blood loss.        Edwin Dada [facility] Pager [phone]

## 2017-03-02 NOTE — Interval H&P Note (Signed)
History and Physical Interval Note:  03/02/2017 1:42 PM  David Gutierrez  has presented today for surgery, with the diagnosis of post-polypectomy bleeding  The various methods of treatment have been discussed with the patient and family. After consideration of risks, benefits and other options for treatment, the patient has consented to  Procedure(s): COLONOSCOPY (N/A) as a surgical intervention .  The patient's history has been reviewed, patient examined, no change in status, stable for surgery.  I have reviewed the patient's chart and labs.  Questions were answered to the patient's satisfaction.     Pricilla Riffle. [place] Plan

## 2017-03-02 NOTE — Telephone Encounter (Signed)
PATIENT ADMITTED TO OBSERVATION STATUS FROM ED LAST EVENING.

## 2017-03-02 NOTE — ED Notes (Signed)
David Gutierrez, NT assisted patient to the restroom and back to bed. Pt tolerated it well with assistance.

## 2017-03-02 NOTE — H&P (View-Only) (Signed)
Referring Provider: [facility]  Primary Care Physician:  Donnajean Lopes, MD Primary Gastroenterologist: Scarlette Shorts, MD  Reason for Consultation:  Rectal bleeding    Attending physician's note   I have taken a history, examined the patient and reviewed the chart. I agree with the Advanced Practitioner's note, impression and recommendations. Hematochezia post polypectomy yesterday. Rectal polypectomy site is the suspect site of bleeding. Trend CBC. Flex sig today.   [name], MD [facility] [phone] Mon-Fri 8a-5p [phone] after 5p, weekends, holidays   ASSESSMENT AND PLAN:   11. 83 yo male with post polypectomy bleeding. Site likely the 5 mm rectal polyp which had some oozing after removal with cold snare.  -flexible sigmoidoscopy for control on bleeding to be done today.  The risks and benefits of the procedure were discussed and the patient agrees to proceed.   2. Mild chronic normocytic anemia. Baseline hgb mid 12 range. Hgb 12.6 around 11pm, down to 11.5 this am.  -trend hgb, transfuse if necessary  3. GERD, controlled on PPI  4. Chronic thrombocytopenia, stable. Count 106  HPI: David Gutierrez is a 83 y.o. male who yesterday underwent colonoscopy with removal of several polyps 5-10 mm in size. There was some oozing from a 5 mm rectal polyps. Yesterday afternoon patient began passing large clots. No associated abdominal or rectal pain. Patient presented to ED in hemodynamically stable condition. No dizziness, loss of consciousness, SOB or chest pain. He has continued to have episodes of rectal bleeding about every hour  Past Medical History:  Diagnosis Date  . Arthritis   . CAD (coronary artery disease)    a. Myoview 11/13:  EF 38%, inf and IS defect c/w scar but no ischemia:  b. Cardiac CT 11/13:  Ca score 318 Agatson units, pLAD and dCFX plaque;   c. LHC 11/07/12:  pLAD 30%, oD1 40%, oCFX 30%, dCFX 40-50%, mRCA 40%, EF 50% (frequent PVCs and short run of NSVT with  injection)  . Colon polyp    adenomatous  . Diverticulosis of colon (without mention of hemorrhage)   . Esophageal reflux   . External hemorrhoids   . Hiatal hernia   . Hypogonadism male   . Interstitial cystitis   . Irritable bowel syndrome   . LBBB (left bundle branch block)   . NICM (nonischemic cardiomyopathy) ([place])    ? 2/2 LBBB => a. echo 11/13: diff HK, worse in septum and apex, mod LVE, mild LVH, EF 40%, mild AI, mild MR, mod LAE  . Personal history of colonic polyps 07/07/2003   adenomatous and hyperplastic   . Unspecified gastritis and gastroduodenitis without mention of hemorrhage     Past Surgical History:  Procedure Laterality Date  . CORONARY ANGIOPLASTY    . ETHMOIDECTOMY Right 12/12/2016   Procedure: RIGHT ENDOSCOPIC ETHMOIDECTOMY;  Surgeon: [name], MD;  Location: [facility];  Service: ENT;  Laterality: Right;  . [place]  . MAXILLARY ANTROSTOMY Right 12/12/2016   Procedure: RIGHT ENDOSCOPIC MAXILLARY ANTROSTOMY;  Surgeon: [name], MD;  Location: [facility];  Service: ENT;  Laterality: Right;  . REPLACEMENT TOTAL KNEE     right  . SINUS ENDO W/FUSION Right 12/12/2016   Procedure: ENDOSCOPIC SINUS SURGERY WITH NAVIGATION;  Surgeon: [name], MD;  Location: [facility];  Service: ENT;  Laterality: Right;  . SPHENOIDECTOMY Right 12/12/2016   Procedure: RIGHT ENDOSCOPIC SPHENOIDECTOMY;  Surgeon: [name], MD;  Location: [facility];  Service: ENT;  Laterality: Right;  . TONSILLECTOMY AND ADENOIDECTOMY    . TOTAL KNEE ARTHROPLASTY Left 10/14/2014   Procedure: LEFT TOTAL KNEE ARTHROPLASTY;  Surgeon: [name], MD;  [facility];  Service: Orthopedics;  Laterality: Left;  . TRANSURETHRAL RESECTION OF PROSTATE      Prior to Admission medications   Medication Sig Start Date End Date Taking? Authorizing Provider  aspirin EC 81 MG tablet Take 81 mg by mouth daily.   Yes Historical Provider, MD    bethanechol (URECHOLINE) 25 MG tablet Take 25 mg by mouth 2 (two) times daily.   Yes Historical Provider, MD  carvedilol (COREG) 6.25 MG tablet TAKE ONE AND ONE-HALF TABLET TWICE DAILY 12/16/16  Yes Larey Dresser, MD  diazepam (VALIUM) 5 MG tablet Take 2.5 mg by mouth 2 (two) times daily.  04/16/14  Yes Historical Provider, MD  diphenhydramine-acetaminophen (TYLENOL PM) 25-500 MG TABS tablet Take 1 tablet by mouth daily.   Yes Historical Provider, MD  levothyroxine (LEVOTHROID) 25 MCG tablet Take 25 mcg by mouth daily. 12/28/15  Yes Larey Dresser, MD  Misc Natural Products (COLON CLEANSER PO) Take 3 capsules by mouth 2 (two) times daily.     Yes Historical Provider, MD  omeprazole (PRILOSEC) 20 MG capsule TAKE ONE CAPSULE BY MOUTH ONCE DAILY 01/10/17  Yes Irene Shipper, MD  rosuvastatin (CRESTOR) 5 MG tablet Take 5 mg by mouth daily.   Yes Historical Provider, MD  thiamine (VITAMIN B-1) 100 MG tablet Take 1 tablet (100 mg total) by mouth daily. 11/14/16  Yes Cameron Sprang, MD    Current Facility-Administered Medications  Medication Dose Route Frequency Provider Last Rate Last Dose  . 0.9 %  sodium chloride infusion   Intravenous Continuous Edwin Dada, MD 100 mL/hr at 03/02/17 0433    . acetaminophen (TYLENOL) tablet 650 mg  650 mg Oral Q6H PRN Edwin Dada, MD       Or  . acetaminophen (TYLENOL) suppository 650 mg  650 mg Rectal Q6H PRN Edwin Dada, MD      . bethanechol (URECHOLINE) tablet 25 mg  25 mg Oral BID Edwin Dada, MD      . carvedilol (COREG) tablet 9.375 mg  9.375 mg Oral BID WC Edwin Dada, MD   9.375 mg at 03/02/17 0741  . diazepam (VALIUM) tablet 2.5 mg  2.5 mg Oral BID Edwin Dada, MD      . levothyroxine (SYNTHROID, LEVOTHROID) tablet 25 mcg  25 mcg Oral QAC breakfast Edwin Dada, MD   25 mcg at 03/02/17 0741  . ondansetron (ZOFRAN) tablet 4 mg  4 mg Oral Q6H PRN Edwin Dada, MD       Or  .  ondansetron (ZOFRAN) injection 4 mg  4 mg Intravenous Q6H PRN Edwin Dada, MD      . rosuvastatin (CRESTOR) tablet 5 mg  5 mg Oral Daily Edwin Dada, MD        Allergies as of 03/01/2017 - Review Complete 03/01/2017  Allergen Reaction Noted  . Cyclobenzaprine  10/14/2014  . Lisinopril  10/14/2014  . Relafen [nabumetone]  10/14/2014  . Statins Other (See Comments) 02/15/2017    Family History  Problem Relation Age of Onset  . Thyroid cancer Brother   . Healthy Mother   . Healthy Father   . Other Sister   . Heart disease Brother   . Healthy Sister   . Healthy Sister   .  Diabetes type I Son   . Colon cancer Neg Hx     Social History   Social History  . Marital status: Married    Spouse name: N/A  . Number of children: N/A  . Years of education: N/A   Occupational History  . retired    Social History Main Topics  . Smoking status: Former Research scientist (life sciences)  . Smokeless tobacco: Never Used  . Alcohol use 0.0 oz/week     Comment: 2-3 drinks per day   . Drug use: No  . Sexual activity: Not on file   Other Topics Concern  . Not on file   Social History Narrative   3 caffinated drinks per day.     Lives with wife at [facility].  Has 3 children.     Retired from [employer].  Education: BA from [employer].     Review of Systems: All systems reviewed and negative except where noted in HPI.  Physical Exam: Vital signs in last 24 hours: Temp:  [97.5 F (36.4 C)-98.3 F (36.8 C)] 98.3 F (36.8 C) (03/29 0424) Pulse Rate:  [55-76] 70 (03/29 0745) Resp:  [10-24] 19 (03/29 0424) BP: (107-135)/(58-113) 115/77 (03/29 0745) SpO2:  [95 %-100 %] 97 % (03/29 0424) Weight:  [215 lb (97.5 kg)] 215 lb (97.5 kg) (03/28 2244) Last BM Date: 03/02/17 General:   Alert, well-developed, well-nourished, pleasant and cooperative in NAD Head:  Normocephalic and atraumatic. Eyes:  Sclera clear, no icterus.   Conjunctiva pink. Ears:  Normal auditory acuity. Nose:  No  deformity, discharge,  or lesions.   Neck:  Supple; no masses Lungs:  Clear throughout to auscultation.   No wheezes, crackles, or rhonchi.  Heart:  Regular rate and rhythm, no edema Abdomen:  Soft,nontender, BS active,nonpalp mass or hsm.   Rectal:  Deferred  Msk:  Symmetrical without gross deformities. . Pulses:  Normal pulses noted. Extremities:  Without clubbing or edema. Neurologic:  Alert and  oriented x4;  grossly normal neurologically. Skin:  Intact without significant lesions or rashes.. Psych:  Alert and cooperative. Normal mood and affect.  Intake/Output from previous day: 03/28 0701 - 03/29 0700 In: 710 [I.V.:710] Out: 2 [Urine:1; Stool:1] Intake/Output this shift: No intake/output data recorded.  Lab Results:  Recent Labs  03/01/17 2311 03/02/17 0638  WBC 6.3 6.9  HGB 12.6* 11.5*  HCT 36.4* 34.5*  PLT 108* 106*   BMET  Recent Labs  03/01/17 2311  NA 139  K 4.0  CL 110  CO2 24  GLUCOSE 95  BUN 11  CREATININE 0.66  CALCIUM 8.8*   LFT No results for input(s): PROT, ALBUMIN, AST, ALT, ALKPHOS, BILITOT, BILIDIR, IBILI in the last 72 hours. PT/INR  Recent Labs  03/01/17 2311  LABPROT 14.1  INR 1.09   Hepatitis Panel No results for input(s): HEPBSAG, HCVAB, HEPAIGM, HEPBIGM in the last 72 hours.    Studies/Results: Dg Abdomen 1 View  Result Date: 03/02/2017 CLINICAL DATA:  83 y/o M; colonoscopy 03/01/2017 with polyps removed. Rectal bleeding. EXAM: ABDOMEN - 1 VIEW COMPARISON:  None. FINDINGS: The bowel gas pattern is normal. No radio-opaque calculi or other significant radiographic abnormality are seen. No pneumoperitoneum identified. Advanced degenerative changes of the lumbar spine. IMPRESSION: Normal bowel gas pattern.  No pneumoperitoneum identified. Electronically Signed   By: Kristine Garbe M.D.   On: 03/02/2017 01:07     Tye Savoy, NP-C @  03/02/2017, 9:16 AM  Pager number [phone]

## 2017-03-02 NOTE — Consult Note (Signed)
Referring Provider: [facility]  Primary Care Physician:  Donnajean Lopes, MD Primary Gastroenterologist: Scarlette Shorts, MD  Reason for Consultation:  Rectal bleeding    Attending physician's note   I have taken a history, examined the patient and reviewed the chart. I agree with the Advanced Practitioner's note, impression and recommendations. Hematochezia post polypectomy yesterday. Rectal polypectomy site is the suspect site of bleeding. Trend CBC. Flex sig today.   David Edward, MD David Gutierrez [phone] Mon-Fri 8a-5p [phone] after 5p, weekends, holidays   ASSESSMENT AND PLAN:   48. 83 yo male with post polypectomy bleeding. Site likely the 5 mm rectal polyp which had some oozing after removal with cold snare.  -flexible sigmoidoscopy for control on bleeding to be done today.  The risks and benefits of the procedure were discussed and the patient agrees to proceed.   2. Mild chronic normocytic anemia. Baseline hgb mid 12 range. Hgb 12.6 around 11pm, down to 11.5 this am.  -trend hgb, transfuse if necessary  3. GERD, controlled on PPI  4. Chronic thrombocytopenia, stable. Count 106  HPI: David Gutierrez is a 83 y.o. male who yesterday underwent colonoscopy with removal of several polyps 5-10 mm in size. There was some oozing from a 5 mm rectal polyps. Yesterday afternoon patient began passing large clots. No associated abdominal or rectal pain. Patient presented to ED in hemodynamically stable condition. No dizziness, loss of consciousness, SOB or chest pain. He has continued to have episodes of rectal bleeding about every hour  Past Medical History:  Diagnosis Date  . Arthritis   . CAD (coronary artery disease)    a. Myoview 11/13:  EF 38%, inf and IS defect c/w scar but no ischemia:  b. Cardiac CT 11/13:  Ca score 318 Agatson units, pLAD and dCFX plaque;   c. LHC 11/07/12:  pLAD 30%, oD1 40%, oCFX 30%, dCFX 40-50%, mRCA 40%, EF 50% (frequent PVCs and short run of NSVT with  injection)  . Colon polyp    adenomatous  . Diverticulosis of colon (without mention of hemorrhage)   . Esophageal reflux   . External hemorrhoids   . Hiatal hernia   . Hypogonadism male   . Interstitial cystitis   . Irritable bowel syndrome   . LBBB (left bundle branch block)   . NICM (nonischemic cardiomyopathy) ([place])    ? 2/2 LBBB => a. echo 11/13: diff HK, worse in septum and apex, mod LVE, mild LVH, EF 40%, mild AI, mild MR, mod LAE  . Personal history of colonic polyps 07/07/2003   adenomatous and hyperplastic   . Unspecified gastritis and gastroduodenitis without mention of hemorrhage     Past Surgical History:  Procedure Laterality Date  . CORONARY ANGIOPLASTY    . ETHMOIDECTOMY Right 12/12/2016   Procedure: RIGHT ENDOSCOPIC ETHMOIDECTOMY;  Surgeon: [name], MD;  Location: [place];  Service: ENT;  Laterality: Right;  . [place]  . MAXILLARY ANTROSTOMY Right 12/12/2016   Procedure: RIGHT ENDOSCOPIC MAXILLARY ANTROSTOMY;  Surgeon: [name], MD;  Location: [place];  Service: ENT;  Laterality: Right;  . REPLACEMENT TOTAL KNEE     right  . SINUS ENDO W/FUSION Right 12/12/2016   Procedure: ENDOSCOPIC SINUS SURGERY WITH NAVIGATION;  Surgeon: [name], MD;  Location: [place];  Service: ENT;  Laterality: Right;  . SPHENOIDECTOMY Right 12/12/2016   Procedure: RIGHT ENDOSCOPIC SPHENOIDECTOMY;  Surgeon: [name], MD;  Location: [place];  Service: ENT;  Laterality: Right;  . TONSILLECTOMY AND ADENOIDECTOMY    . TOTAL KNEE ARTHROPLASTY Left 10/14/2014   Procedure: LEFT TOTAL KNEE ARTHROPLASTY;  Surgeon: [name], MD;  Location: [facility];  Service: Orthopedics;  Laterality: Left;  . TRANSURETHRAL RESECTION OF PROSTATE      Prior to Admission medications   Medication Sig Start Date End Date Taking? Authorizing Provider  aspirin EC 81 MG tablet Take 81 mg by mouth daily.   Yes Historical Provider, MD    bethanechol (URECHOLINE) 25 MG tablet Take 25 mg by mouth 2 (two) times daily.   Yes Historical Provider, MD  carvedilol (COREG) 6.25 MG tablet TAKE ONE AND ONE-HALF TABLET TWICE DAILY 12/16/16  Yes Larey Dresser, MD  diazepam (VALIUM) 5 MG tablet Take 2.5 mg by mouth 2 (two) times daily.  04/16/14  Yes Historical Provider, MD  diphenhydramine-acetaminophen (TYLENOL PM) 25-500 MG TABS tablet Take 1 tablet by mouth daily.   Yes Historical Provider, MD  levothyroxine (LEVOTHROID) 25 MCG tablet Take 25 mcg by mouth daily. 12/28/15  Yes Larey Dresser, MD  Misc Natural Products (COLON CLEANSER PO) Take 3 capsules by mouth 2 (two) times daily.     Yes Historical Provider, MD  omeprazole (PRILOSEC) 20 MG capsule TAKE ONE CAPSULE BY MOUTH ONCE DAILY 01/10/17  Yes Irene Shipper, MD  rosuvastatin (CRESTOR) 5 MG tablet Take 5 mg by mouth daily.   Yes Historical Provider, MD  thiamine (VITAMIN B-1) 100 MG tablet Take 1 tablet (100 mg total) by mouth daily. 11/14/16  Yes Cameron Sprang, MD    Current Facility-Administered Medications  Medication Dose Route Frequency Provider Last Rate Last Dose  . 0.9 %  sodium chloride infusion   Intravenous Continuous Edwin Dada, MD 100 mL/hr at 03/02/17 0433    . acetaminophen (TYLENOL) tablet 650 mg  650 mg Oral Q6H PRN Edwin Dada, MD       Or  . acetaminophen (TYLENOL) suppository 650 mg  650 mg Rectal Q6H PRN Edwin Dada, MD      . bethanechol (URECHOLINE) tablet 25 mg  25 mg Oral BID Edwin Dada, MD      . carvedilol (COREG) tablet 9.375 mg  9.375 mg Oral BID WC Edwin Dada, MD   9.375 mg at 03/02/17 0741  . diazepam (VALIUM) tablet 2.5 mg  2.5 mg Oral BID Edwin Dada, MD      . levothyroxine (SYNTHROID, LEVOTHROID) tablet 25 mcg  25 mcg Oral QAC breakfast Edwin Dada, MD   25 mcg at 03/02/17 0741  . ondansetron (ZOFRAN) tablet 4 mg  4 mg Oral Q6H PRN Edwin Dada, MD       Or  .  ondansetron (ZOFRAN) injection 4 mg  4 mg Intravenous Q6H PRN Edwin Dada, MD      . rosuvastatin (CRESTOR) tablet 5 mg  5 mg Oral Daily Edwin Dada, MD        Allergies as of 03/01/2017 - Review Complete 03/01/2017  Allergen Reaction Noted  . Cyclobenzaprine  10/14/2014  . Lisinopril  10/14/2014  . Relafen [nabumetone]  10/14/2014  . Statins Other (See Comments) 02/15/2017    Family History  Problem Relation Age of Onset  . Thyroid cancer Brother   . Healthy Mother   . Healthy Father   . Other Sister   . Heart disease Brother   . Healthy Sister   . Healthy Sister   .  Diabetes type I Son   . Colon cancer Neg Hx     Social History   Social History  . Marital status: Married    Spouse name: N/A  . Number of children: N/A  . Years of education: N/A   Occupational History  . retired    Social History Main Topics  . Smoking status: Former Research scientist (life sciences)  . Smokeless tobacco: Never Used  . Alcohol use 0.0 oz/week     Comment: 2-3 drinks per day   . Drug use: No  . Sexual activity: Not on file   Other Topics Concern  . Not on file   Social History Narrative   3 caffinated drinks per day.     Lives with wife at [place].  Has 3 children.     Retired from [employer].  Education: BA from [employer].     Review of Systems: All systems reviewed and negative except where noted in HPI.  Physical Exam: Vital signs in last 24 hours: Temp:  [97.5 F (36.4 C)-98.3 F (36.8 C)] 98.3 F (36.8 C) (03/29 0424) Pulse Rate:  [55-76] 70 (03/29 0745) Resp:  [10-24] 19 (03/29 0424) BP: (107-135)/(58-113) 115/77 (03/29 0745) SpO2:  [95 %-100 %] 97 % (03/29 0424) Weight:  [215 lb (97.5 kg)] 215 lb (97.5 kg) (03/28 2244) Last BM Date: 03/02/17 General:   Alert, well-developed, well-nourished, pleasant and cooperative in NAD Head:  Normocephalic and atraumatic. Eyes:  Sclera clear, no icterus.   Conjunctiva pink. Ears:  Normal auditory acuity. Nose:  No  deformity, discharge,  or lesions.   Neck:  Supple; no masses Lungs:  Clear throughout to auscultation.   No wheezes, crackles, or rhonchi.  Heart:  Regular rate and rhythm, no edema Abdomen:  Soft,nontender, BS active,nonpalp mass or hsm.   Rectal:  Deferred  Msk:  Symmetrical without gross deformities. . Pulses:  Normal pulses noted. Extremities:  Without clubbing or edema. Neurologic:  Alert and  oriented x4;  grossly normal neurologically. Skin:  Intact without significant lesions or rashes.. Psych:  Alert and cooperative. Normal mood and affect.  Intake/Output from previous day: 03/28 0701 - 03/29 0700 In: 710 [I.V.:710] Out: 2 [Urine:1; Stool:1] Intake/Output this shift: No intake/output data recorded.  Lab Results:  Recent Labs  03/01/17 2311 03/02/17 0638  WBC 6.3 6.9  HGB 12.6* 11.5*  HCT 36.4* 34.5*  PLT 108* 106*   BMET  Recent Labs  03/01/17 2311  NA 139  K 4.0  CL 110  CO2 24  GLUCOSE 95  BUN 11  CREATININE 0.66  CALCIUM 8.8*   LFT No results for input(s): PROT, ALBUMIN, AST, ALT, ALKPHOS, BILITOT, BILIDIR, IBILI in the last 72 hours. PT/INR  Recent Labs  03/01/17 2311  LABPROT 14.1  INR 1.09   Hepatitis Panel No results for input(s): HEPBSAG, HCVAB, HEPAIGM, HEPBIGM in the last 72 hours.    Studies/Results: Dg Abdomen 1 View  Result Date: 03/02/2017 CLINICAL DATA:  83 y/o M; colonoscopy 03/01/2017 with polyps removed. Rectal bleeding. EXAM: ABDOMEN - 1 VIEW COMPARISON:  None. FINDINGS: The bowel gas pattern is normal. No radio-opaque calculi or other significant radiographic abnormality are seen. No pneumoperitoneum identified. Advanced degenerative changes of the lumbar spine. IMPRESSION: Normal bowel gas pattern.  No pneumoperitoneum identified. Electronically Signed   By: Kristine Garbe M.D.   On: 03/02/2017 01:07     Tye Savoy, NP-C @  03/02/2017, 9:16 AM  Pager number [phone]

## 2017-03-03 DIAGNOSIS — D62 Acute posthemorrhagic anemia: Secondary | ICD-10-CM

## 2017-03-03 LAB — COMPREHENSIVE METABOLIC PANEL
ALT: 11 U/L — AB (ref 17–63)
AST: 15 U/L (ref 15–41)
Albumin: 3.1 g/dL — ABNORMAL LOW (ref 3.5–5.0)
Alkaline Phosphatase: 55 U/L (ref 38–126)
Anion gap: 4 — ABNORMAL LOW (ref 5–15)
BUN: <5 mg/dL — ABNORMAL LOW (ref 6–20)
CALCIUM: 8.4 mg/dL — AB (ref 8.9–10.3)
CHLORIDE: 111 mmol/L (ref 101–111)
CO2: 25 mmol/L (ref 22–32)
CREATININE: 0.56 mg/dL — AB (ref 0.61–1.24)
Glucose, Bld: 102 mg/dL — ABNORMAL HIGH (ref 65–99)
Potassium: 3.8 mmol/L (ref 3.5–5.1)
Sodium: 140 mmol/L (ref 135–145)
TOTAL PROTEIN: 5.4 g/dL — AB (ref 6.5–8.1)
Total Bilirubin: 1 mg/dL (ref 0.3–1.2)

## 2017-03-03 LAB — CBC WITH DIFFERENTIAL/PLATELET
Basophils Absolute: 0 10*3/uL (ref 0.0–0.1)
Basophils Relative: 1 %
EOS PCT: 2 %
Eosinophils Absolute: 0.1 10*3/uL (ref 0.0–0.7)
HCT: 28 % — ABNORMAL LOW (ref 39.0–52.0)
Hemoglobin: 9.4 g/dL — ABNORMAL LOW (ref 13.0–17.0)
LYMPHS ABS: 1.5 10*3/uL (ref 0.7–4.0)
LYMPHS PCT: 26 %
MCH: 31.6 pg (ref 26.0–34.0)
MCHC: 33.6 g/dL (ref 30.0–36.0)
MCV: 94.3 fL (ref 78.0–100.0)
MONO ABS: 0.4 10*3/uL (ref 0.1–1.0)
Monocytes Relative: 8 %
Neutro Abs: 3.6 10*3/uL (ref 1.7–7.7)
Neutrophils Relative %: 64 %
PLATELETS: 93 10*3/uL — AB (ref 150–400)
RBC: 2.97 MIL/uL — AB (ref 4.22–5.81)
RDW: 18.1 % — ABNORMAL HIGH (ref 11.5–15.5)
WBC: 5.6 10*3/uL (ref 4.0–10.5)

## 2017-03-03 LAB — MAGNESIUM: MAGNESIUM: 1.8 mg/dL (ref 1.7–2.4)

## 2017-03-03 LAB — PHOSPHORUS: PHOSPHORUS: 2.8 mg/dL (ref 2.5–4.6)

## 2017-03-03 NOTE — Discharge Summary (Signed)
Physician Discharge Summary  David Gutierrez HYI:[id number] DOB: [birth year] DOA: 03/01/2017  PCP: David Lopes, MD  Admit date: 03/01/2017 Discharge date: 03/03/2017  Admitted From: Home Disposition:  Home  Recommendations for Outpatient Follow-up:  1. Follow up with PCP in 1-2 weeks 2. Follow up with Gastroenterology Dr David Gutierrez in 2 weeks 3. Avoid NSAIDS/ASA for 3 weeks 4. No Heavy Lifting or Strenuous Activity for at least 2 weeks 5. Please obtain BMP/CBC in one week  Home Health: No Equipment/Devices: None  Discharge Condition: Stable CODE STATUS: FULL CODE Diet recommendation: Heart Healthy   Brief/Interim Summary: David Gutierrez is a 83 y.o. male with a past medical history significant for HTN, NICM EF 40% without CHF, and hypothyroidism who presents with rectal bleeding post-colonoscopy. The patient underwent routine (high risk) screening colonoscopy this mroning.  Had 6 sessile polyps removed with cold snare, one in transverse colon was oozing and required some hemostasis, all per procedure report.The patient went home, had some eggs and grits, but by this evening, he started to have painless, bright red rectal bleeding with bowel movements.  He had about 5 BR BMs at home, then came tot he ER, where he had several more. Worked up and Admitted for Lower GI Bleed and went under a Flex Sigmoidoscopy and found to have a single actively bleeding ulcer in the rectum that was clipped and injected with Epinephrine. Patient had no further bleeding and even though patient's Hb/Hct dropped to 9.4/28.0 he was hemodynamically stable with no re-occurrence. He was deemed medically stable for D/C and will need to follow up with Gastroenterology within 2 weeks and avoid ASA/NSAIDs for 3 weeks. Will also need to follow up with his PCP for Hospital D/C.  Discharge Diagnoses:  Principal Problem:   Rectal bleeding Active Problems:   Coronary atherosclerosis of native coronary artery   NICM (nonischemic  cardiomyopathy) ([place])  1. Rectal bleeding post-procedure (colonoscopy) from single bleeding ulcer in Recutm -Underwent Flex Sig and Appreciate GI Recc's -Ulcer was clipped and injected -Hb/Hct fell to 9.4/28.0 with no further bleeding; Patient hemodynamically stable -Per GI: OK for discharge today.  -No ASA/NSAIDs for 3 weeks.  -No strenuous activity, heavy lifting, etc. for 2 weeks.  -Follow up with Dr. Henrene Gutierrez in 2 weeks.  -Repeat CBC as an outpatient  2. ABLA -As above  3. Hypertension and NICM:  -Continue BB -Continue statin -Hold aspirin for 3 weeks  4. Hypothyroidism:  -Continue levothryroxine  5. Interstitial Cystitis -C/w Bethanecol   6. Thrombocytopenia:  -Mild. -Patient's Platelet count was 93 -Repeat CBC as an outpatient     Discharge Instructions  Discharge Instructions    Call MD for:  difficulty breathing, headache or visual disturbances    Complete by:  As directed    Call MD for:  extreme fatigue    Complete by:  As directed    Call MD for:  hives    Complete by:  As directed    Call MD for:  persistant dizziness or light-headedness    Complete by:  As directed    Call MD for:  persistant nausea and vomiting    Complete by:  As directed    Call MD for:  severe uncontrolled pain    Complete by:  As directed    Call MD for:  temperature >100.4    Complete by:  As directed    Diet - low sodium heart healthy    Complete by:  As directed    Discharge  instructions    Complete by:  As directed    Follow up with PCP and follow up with Dr. Henrene Gutierrez in Gastroenterology in 2 weeks. Take all medications as prescribed and hold taking ASA and NSAIDs (Asprin, Ibuprofen, Aleeve) for 2-3 weeks. If symptoms change or worsen please return to the ED for evaluation.   Increase activity slowly    Complete by:  As directed    Limit Strenuous Activity for at least 2 weeks     Allergies as of 03/03/2017      Reactions   Cyclobenzaprine    Lost balance   Lisinopril     Affected ability to urinate   Relafen [nabumetone]    Problems urinating afterwards    Statins Other (See Comments)   "my peeing"      Medication List    STOP taking these medications   aspirin EC 81 MG tablet     TAKE these medications   bethanechol 25 MG tablet Commonly known as:  URECHOLINE Take 25 mg by mouth 2 (two) times daily.   carvedilol 6.25 MG tablet Commonly known as:  COREG TAKE ONE AND ONE-HALF TABLET TWICE DAILY   COLON CLEANSER PO Take 3 capsules by mouth 2 (two) times daily.   diazepam 5 MG tablet Commonly known as:  VALIUM Take 2.5 mg by mouth 2 (two) times daily.   diphenhydramine-acetaminophen 25-500 MG Tabs tablet Commonly known as:  TYLENOL PM Take 1 tablet by mouth daily.   LEVOTHROID 25 MCG tablet Generic drug:  levothyroxine Take 25 mcg by mouth daily.   omeprazole 20 MG capsule Commonly known as:  PRILOSEC TAKE ONE CAPSULE BY MOUTH ONCE DAILY   rosuvastatin 5 MG tablet Commonly known as:  CRESTOR Take 5 mg by mouth daily.   thiamine 100 MG tablet Commonly known as:  VITAMIN B-1 Take 1 tablet (100 mg total) by mouth daily.       Allergies  Allergen Reactions  . Cyclobenzaprine     Lost balance  . Lisinopril     Affected ability to urinate  . Relafen [Nabumetone]     Problems urinating afterwards   . Statins Other (See Comments)    "my peeing"   Consultations:  Gastroenterology  Procedures/Studies: Dg Abdomen 1 View  Result Date: 03/02/2017 CLINICAL DATA:  83 y/o M; colonoscopy 03/01/2017 with polyps removed. Rectal bleeding. EXAM: ABDOMEN - 1 VIEW COMPARISON:  None. FINDINGS: The bowel gas pattern is normal. No radio-opaque calculi or other significant radiographic abnormality are seen. No pneumoperitoneum identified. Advanced degenerative changes of the lumbar spine. IMPRESSION: Normal bowel gas pattern.  No pneumoperitoneum identified. Electronically Signed   By: Kristine Garbe M.D.   On: 03/02/2017 01:07    COLONOSCOPY Findings:      The perianal and digital rectal examinations were normal.      A single (solitary) eight mm ulcer was found in the rectum with a large       clot and active bleeding was present. This corresponded to the rectal       polypectomy site. Stigmata of recent/ongoing bleeding were present. 8      cc of epinephrine injected around ulcer with persistent bleeding. The clot  was gently removed and bleeding persisted. To stop active bleeding,       three hemostatic clips were successfully placed (MR conditional). There       was no bleeding at the end of the procedure.      A few medium-mouthed diverticula were  found in the sigmoid colon. There       was no evidence of diverticular bleeding.      Internal hemorrhoids were found during retroflexion. The hemorrhoids       were small and Grade I (internal hemorrhoids that do not prolapse). Impression:               - Preparation of the colon was poor.                           - A single, actively bleeding ulcer in the rectum.                            3 clips (MR conditional) were placed. Epi injected.                            Hemostasis achieved.                           - Sigmoid colon diverticulosis.                           - Internal hemorrhoids.                           - No specimens collected.   Subjective: Seen and examined this AM and was doing well. Felt well and had no more bleeding episodes. Had not had a bowel movement though. No nausea, vomiting, or abdominal pain. Ready to go home.  Discharge Exam: Vitals:   03/02/17 2051 03/03/17 0452  BP: 114/61 124/64  Pulse: 78 83  Resp: 18 20  Temp: 97.4 F (36.3 C) 98.3 F (36.8 C)   Vitals:   03/02/17 1244 03/02/17 1415 03/02/17 2051 03/03/17 0452  BP: 125/63 136/66 114/61 124/64  Pulse:  73 78 83  Resp: 12 19 18 20   Temp: 97.8 F (36.6 C) 97.9 F (36.6 C) 97.4 F (36.3 C) 98.3 F (36.8 C)  TempSrc: Oral Oral Oral Oral  SpO2: 99% 100% 98% 96%   Weight:      Height:       General: Pt is alert, awake, not in acute distress Cardiovascular: RRR, S1/S2 +, no rubs, no gallops Respiratory: CTA bilaterally, no wheezing, no rhonchi Abdominal: Soft, NT, ND, bowel sounds + Extremities: no edema, no cyanosis  The results of significant diagnostics from this hospitalization (including imaging, microbiology, ancillary and laboratory) are listed below for reference.    Microbiology: No results found for this or any previous visit (from the past 240 hour(s)).   Labs: BNP (last 3 results) No results for input(s): BNP in the last 8760 hours. Basic Metabolic Panel:  Recent Labs Lab 03/01/17 2311 03/03/17 0511  NA 139 140  K 4.0 3.8  CL 110 111  CO2 24 25  GLUCOSE 95 102*  BUN 11 <5*  CREATININE 0.66 0.56*  CALCIUM 8.8* 8.4*  MG  --  1.8  PHOS  --  2.8   Liver Function Tests:  Recent Labs Lab 03/03/17 0511  AST 15  ALT 11*  ALKPHOS 55  BILITOT 1.0  PROT 5.4*  ALBUMIN 3.1*   No results for input(s): LIPASE, AMYLASE in the last 168 hours. No results for input(s): AMMONIA in the last 168  hours. CBC:  Recent Labs Lab 03/01/17 2311 03/02/17 0638 03/02/17 1622 03/03/17 0511  WBC 6.3 6.9 6.8 5.6  NEUTROABS 3.7  --   --  3.6  HGB 12.6* 11.5* 10.9* 9.4*  HCT 36.4* 34.5* 31.5* 28.0*  MCV 91.0 94.8 91.0 94.3  PLT 108* 106* 96* 93*   Cardiac Enzymes: No results for input(s): CKTOTAL, CKMB, CKMBINDEX, TROPONINI in the last 168 hours. BNP: Invalid input(s): POCBNP CBG: No results for input(s): GLUCAP in the last 168 hours. D-Dimer No results for input(s): DDIMER in the last 72 hours. Hgb A1c No results for input(s): HGBA1C in the last 72 hours. Lipid Profile No results for input(s): CHOL, HDL, LDLCALC, TRIG, CHOLHDL, LDLDIRECT in the last 72 hours. Thyroid function studies No results for input(s): TSH, T4TOTAL, T3FREE, THYROIDAB in the last 72 hours.  Invalid input(s): FREET3 Anemia work up No results for  input(s): VITAMINB12, FOLATE, FERRITIN, TIBC, IRON, RETICCTPCT in the last 72 hours. Urinalysis    Component Value Date/Time   COLORURINE YELLOW 10/02/2014 0855   APPEARANCEUR TURBID (A) 10/02/2014 0855   LABSPEC 1.014 10/02/2014 0855   PHURINE 5.5 10/02/2014 0855   GLUCOSEU NEGATIVE 10/02/2014 0855   HGBUR SMALL (A) 10/02/2014 0855   BILIRUBINUR NEGATIVE 10/02/2014 0855   KETONESUR NEGATIVE 10/02/2014 0855   PROTEINUR NEGATIVE 10/02/2014 0855   UROBILINOGEN 0.2 10/02/2014 0855   NITRITE POSITIVE (A) 10/02/2014 0855   LEUKOCYTESUR LARGE (A) 10/02/2014 0855   Sepsis Labs Invalid input(s): PROCALCITONIN,  WBC,  LACTICIDVEN Microbiology No results found for this or any previous visit (from the past 240 hour(s)).  Time coordinating discharge: 35 minutes  SIGNED:  Kerney Elbe, DO [facility] 03/03/2017, 9:04 PM Pager [phone]  If 7PM-7AM, please contact night-coverage [email or web address] Password [email or web address]

## 2017-03-03 NOTE — Progress Notes (Signed)
[facility] Progress Note  Chief Complaint:   GI bleed  Subjective: No BMs or further bleeding. Feel fine and wants to go home  Objective:  Vital signs in last 24 hours: Temp:  [97.4 F (36.3 C)-98.3 F (36.8 C)] 98.3 F (36.8 C) (03/30 0452) Pulse Rate:  [73-83] 83 (03/30 0452) Resp:  [12-20] 20 (03/30 0452) BP: (114-136)/(61-66) 124/64 (03/30 0452) SpO2:  [96 %-100 %] 96 % (03/30 0452) Last BM Date: 03/02/17 General:   Alert, well-developed white male in NAD EENT:  Normal hearing, non icteric sclera, conjunctive pink.  Heart:  Regular rate and rhythm; no murmurs Pulm: Normal respiratory effort Abdomen:  Soft, nondistended, nontender.  Normal bowel sounds Neurologic:  Alert and  oriented x4;  grossly normal neurologically. Psych:  Alert and cooperative. Normal mood and affect.   Intake/Output from previous day: 03/29 0701 - 03/30 0700 In: 1850 [P.O.:280; I.V.:1570] Out: -  Intake/Output this shift: Total I/O In: 360 [P.O.:360] Out: -   Lab Results:  Recent Labs  03/02/17 0638 03/02/17 1622 03/03/17 0511  WBC 6.9 6.8 5.6  HGB 11.5* 10.9* 9.4*  HCT 34.5* 31.5* 28.0*  PLT 106* 96* 93*   BMET  Recent Labs  03/01/17 2311 03/03/17 0511  NA 139 140  K 4.0 3.8  CL 110 111  CO2 24 25  GLUCOSE 95 102*  BUN 11 <5*  CREATININE 0.66 0.56*  CALCIUM 8.8* 8.4*   LFT  Recent Labs  03/03/17 0511  PROT 5.4*  ALBUMIN 3.1*  AST 15  ALT 11*  ALKPHOS 55  BILITOT 1.0   PT/INR  Recent Labs  03/01/17 2311  LABPROT 14.1  INR 1.09   Hepatitis Panel No results for input(s): HEPBSAG, HCVAB, HEPAIGM, HEPBIGM in the last 72 hours.  Dg Abdomen 1 View  Result Date: 03/02/2017 CLINICAL DATA:  83 y/o M; colonoscopy 03/01/2017 with polyps removed. Rectal bleeding. EXAM: ABDOMEN - 1 VIEW COMPARISON:  None. FINDINGS: The bowel gas pattern is normal. No radio-opaque calculi or other significant radiographic abnormality are seen. No  pneumoperitoneum identified. Advanced degenerative changes of the lumbar spine. IMPRESSION: Normal bowel gas pattern.  No pneumoperitoneum identified. Electronically Signed   By: Kristine Garbe M.D.   On: 03/02/2017 01:07    Assessment / Plan:  1. 83 yo male with post-polypectomy bleed, s/p flexible sigmoidoscopy with control of bleeding yesterday.  -no further bleeding. Patient stable for discharge from GI standpoint -advised against any heavy lifting or strenuous exercise for next 7-10 days  2. ABL. Since admission hgb has fallen approx 3 gram (12.6 >>> 9.4). He has no dizziness, SOB. Been up to BR without any problems  Principal Problem:   Rectal bleeding Active Problems:   Coronary atherosclerosis of native coronary artery   NICM (nonischemic cardiomyopathy) ([place])    LOS: 1 day   Tye Savoy NP 03/03/2017, 10:28 AM  Pager number [phone]    Attending physician's note   I have taken an interval history, reviewed the chart and examined the patient. I agree with the Advanced Practitioner's note, impression and recommendations.  Postpolypectomy bleed controlled yesterday at flex sig with epi inj and clips.  ABL anemia with Hb drop to 9.4.  OK for discharge today.  No ASA/NSAIDs for 3 weeks.  No strenuous activity, heavy lifting, etc. for 2 weeks.  Follow up with Dr. Henrene Pastor in 2 weeks.  GI signing off.    [name], MD [facility] [phone] Mon-Fri 8a-5p [phone] after 5p, weekends,  holidays

## 2017-03-03 NOTE — Progress Notes (Signed)
Date: March 03, 2017 Discharge orders review for case management needs.  None found Velva Harman, BSN, [place], [place]: [phone]

## 2017-03-03 NOTE — Care Management Note (Signed)
Case Management Note  Patient Details  Name: David Gutierrez MRN: [id number] Date of Birth: 1933/08/07  Subjective/Objective:           Gi bleeding         Action/Plan:Date:  March 03, 2017 Chart reviewed for concurrent status and case management needs. Will continue to follow patient progress. Discharge Planning: following for needs Expected discharge date: [id number] Velva Harman, BSN, [place], [place]   Expected Discharge Date:                  Expected Discharge Plan:  Home/Self Care  In-House Referral:     Discharge planning Services     Post Acute Care Choice:    Choice offered to:     DME Arranged:    DME Agency:     HH Arranged:    [place] Agency:     Status of Service:  In process, will continue to follow  If discussed at Long Length of Stay Meetings, dates discussed:    Additional Comments:  Leeroy Cha, RN 03/03/2017, 10:33 AM

## 2017-03-03 NOTE — Progress Notes (Signed)
Pt to be discharged home today. Discharge instructions gone over with patient. All questions and concerns answered. IV site was removed.

## 2017-03-06 ENCOUNTER — Ambulatory Visit (INDEPENDENT_AMBULATORY_CARE_PROVIDER_SITE_OTHER): Payer: PPO | Admitting: *Deleted

## 2017-03-06 ENCOUNTER — Other Ambulatory Visit: Payer: Self-pay

## 2017-03-06 ENCOUNTER — Telehealth: Payer: Self-pay

## 2017-03-06 DIAGNOSIS — Z1389 Encounter for screening for other disorder: Secondary | ICD-10-CM | POA: Diagnosis not present

## 2017-03-06 DIAGNOSIS — M545 Low back pain: Secondary | ICD-10-CM | POA: Diagnosis not present

## 2017-03-06 DIAGNOSIS — E538 Deficiency of other specified B group vitamins: Secondary | ICD-10-CM

## 2017-03-06 DIAGNOSIS — R200 Anesthesia of skin: Secondary | ICD-10-CM | POA: Diagnosis not present

## 2017-03-06 DIAGNOSIS — Z6829 Body mass index (BMI) 29.0-29.9, adult: Secondary | ICD-10-CM | POA: Diagnosis not present

## 2017-03-06 DIAGNOSIS — I2510 Atherosclerotic heart disease of native coronary artery without angina pectoris: Secondary | ICD-10-CM | POA: Diagnosis not present

## 2017-03-06 DIAGNOSIS — K921 Melena: Secondary | ICD-10-CM

## 2017-03-06 DIAGNOSIS — D696 Thrombocytopenia, unspecified: Secondary | ICD-10-CM | POA: Diagnosis not present

## 2017-03-06 DIAGNOSIS — R5383 Other fatigue: Secondary | ICD-10-CM | POA: Diagnosis not present

## 2017-03-06 DIAGNOSIS — E784 Other hyperlipidemia: Secondary | ICD-10-CM | POA: Diagnosis not present

## 2017-03-06 DIAGNOSIS — Z0000 Encounter for general adult medical examination without abnormal findings: Secondary | ICD-10-CM | POA: Diagnosis not present

## 2017-03-06 DIAGNOSIS — K625 Hemorrhage of anus and rectum: Secondary | ICD-10-CM | POA: Diagnosis not present

## 2017-03-06 DIAGNOSIS — I10 Essential (primary) hypertension: Secondary | ICD-10-CM | POA: Diagnosis not present

## 2017-03-06 DIAGNOSIS — N3010 Interstitial cystitis (chronic) without hematuria: Secondary | ICD-10-CM | POA: Diagnosis not present

## 2017-03-06 MED ORDER — CYANOCOBALAMIN 1000 MCG/ML IJ SOLN
1000.0000 ug | Freq: Once | INTRAMUSCULAR | Status: AC
  Administered 2017-03-06: 1000 ug via INTRAMUSCULAR

## 2017-03-06 NOTE — Telephone Encounter (Signed)
Thank you for the update. I see that he has follow-up with Amy on April 16. As long as he is doing well, that will be necessary. I would have him get a follow-up CBC, however, next week. Thanks

## 2017-03-06 NOTE — Telephone Encounter (Signed)
Pt aware and order in epic for CBC.

## 2017-03-06 NOTE — Telephone Encounter (Signed)
Pt states he is doing well. Pt had physical this am with his PCP. States he is still weak from his Hgb dropping but states he is doing ok.

## 2017-03-06 NOTE — Telephone Encounter (Signed)
-----   Message from Irene Shipper, MD sent at 03/03/2017  1:56 PM EDT ----- 1. Norberto Sorenson, thanks for helping [name] 2. Vaughan Basta, call [name] on Monday to see how he's doing 3. Olivia Mackie, CQI case  Thanks All

## 2017-03-08 ENCOUNTER — Encounter: Payer: Self-pay | Admitting: Internal Medicine

## 2017-03-13 ENCOUNTER — Other Ambulatory Visit (INDEPENDENT_AMBULATORY_CARE_PROVIDER_SITE_OTHER): Payer: PPO

## 2017-03-13 DIAGNOSIS — K921 Melena: Secondary | ICD-10-CM

## 2017-03-13 LAB — CBC WITH DIFFERENTIAL/PLATELET
BASOS PCT: 0.9 % (ref 0.0–3.0)
Basophils Absolute: 0.1 10*3/uL (ref 0.0–0.1)
EOS ABS: 0.4 10*3/uL (ref 0.0–0.7)
EOS PCT: 5.7 % — AB (ref 0.0–5.0)
HEMATOCRIT: 32.6 % — AB (ref 39.0–52.0)
HEMOGLOBIN: 10.8 g/dL — AB (ref 13.0–17.0)
LYMPHS PCT: 26.7 % (ref 12.0–46.0)
Lymphs Abs: 1.8 10*3/uL (ref 0.7–4.0)
MCHC: 33.2 g/dL (ref 30.0–36.0)
MCV: 95.6 fl (ref 78.0–100.0)
MONOS PCT: 10.3 % (ref 3.0–12.0)
Monocytes Absolute: 0.7 10*3/uL (ref 0.1–1.0)
NEUTROS ABS: 3.7 10*3/uL (ref 1.4–7.7)
Neutrophils Relative %: 56.4 % (ref 43.0–77.0)
PLATELETS: 160 10*3/uL (ref 150.0–400.0)
RBC: 3.41 Mil/uL — ABNORMAL LOW (ref 4.22–5.81)
RDW: 19.2 % — AB (ref 11.5–15.5)
WBC: 6.6 10*3/uL (ref 4.0–10.5)

## 2017-03-17 ENCOUNTER — Other Ambulatory Visit: Payer: Self-pay | Admitting: Cardiology

## 2017-03-17 ENCOUNTER — Encounter: Payer: Self-pay | Admitting: Physician Assistant

## 2017-03-17 ENCOUNTER — Ambulatory Visit (INDEPENDENT_AMBULATORY_CARE_PROVIDER_SITE_OTHER): Payer: PPO | Admitting: Physician Assistant

## 2017-03-17 VITALS — BP 100/70 | HR 76 | Ht 72.0 in | Wt 213.0 lb

## 2017-03-17 DIAGNOSIS — Z8719 Personal history of other diseases of the digestive system: Secondary | ICD-10-CM

## 2017-03-17 DIAGNOSIS — E785 Hyperlipidemia, unspecified: Secondary | ICD-10-CM

## 2017-03-17 DIAGNOSIS — D500 Iron deficiency anemia secondary to blood loss (chronic): Secondary | ICD-10-CM

## 2017-03-17 NOTE — Patient Instructions (Signed)
Your physician has requested that you go to the basement for the following lab work in 2 weeks (week of 03/30/17)  Continue oral iron for one month then discontinue.   Call if you have any problems.

## 2017-03-17 NOTE — Progress Notes (Signed)
Subjective:    Patient ID: David Gutierrez, male    DOB: 1933-03-12, 83 y.o.   MRN: [id number]  HPI David Gutierrez is an 83 year old white male known to Dr. Henrene Pastor who had undergone colonoscopy on 03/01/2017 for surveillance of adenomatous polyps. He was found to have 6 sessile polyps ranging from 5-10 mm in the rectum to the ascending colon and also mild diverticulosis. All of the polyps were removed with cold snare. Path consistent with tubular adenomas. Unfortunately patient developed GI bleeding which started later that same day. He passed a lot of bright red blood and presented to the emergency room and was admitted. His baseline hemoglobin is in the 14 range, hemoglobin was 12.6 on admission and dropped to 9.4. He did not require transfusion. He underwent flexible sigmoidoscopy on 03/02/2017 per Dr. Fuller Plan who is covering the hospital. He was found to have a single 8 mm ulcer in the rectum with a large clot and evidence of active bleeding. The ulcer was injected with epinephrine, and then endoclipped 3. Patient has not had any further bleeding since. He complains of generalized fatigue which has persisted over the past 2 weeks. Appendectomy denies any shortness of breath or dizziness, no chest pain. His appetite is been good. He was placed on an oral iron supplement and says his stools have been dark but again he has not noticed any blood. He had labs drawn on 03/13/2017 with hemoglobin up to 10.8. He is not on any blood thinners and is remaining off baby aspirin for now.   Patient has history of coronary artery disease, nonischemic cardiomyopathy with EF of 45%. Peripheral neuropathy and diverticular disease.   Review of Systems Pertinent positive and negative review of systems were noted in the above HPI section.  All other review of systems was otherwise negative.  Outpatient Encounter Prescriptions as of 03/17/2017  Medication Sig  . bethanechol (URECHOLINE) 25 MG tablet Take 25 mg by mouth 2 (two)  times daily.  . carvedilol (COREG) 6.25 MG tablet TAKE ONE AND ONE-HALF TABLET TWICE DAILY  . diazepam (VALIUM) 5 MG tablet Take 2.5 mg by mouth 2 (two) times daily.   . diphenhydramine-acetaminophen (TYLENOL PM) 25-500 MG TABS tablet Take 1 tablet by mouth daily.  . iron polysaccharides (POLY-IRON 150) 150 MG capsule Take 150 mg by mouth daily.  Marland Kitchen levothyroxine (LEVOTHROID) 25 MCG tablet Take 25 mcg by mouth daily.  . Misc Natural Products (COLON CLEANSER PO) Take 3 capsules by mouth 2 (two) times daily.    Marland Kitchen omeprazole (PRILOSEC) 20 MG capsule TAKE ONE CAPSULE BY MOUTH ONCE DAILY  . rosuvastatin (CRESTOR) 5 MG tablet Take 5 mg by mouth daily.  Marland Kitchen thiamine (VITAMIN B-1) 100 MG tablet Take 1 tablet (100 mg total) by mouth daily.  . vitamin C (ASCORBIC ACID) 500 MG tablet Take 500 mg by mouth daily.   No facility-administered encounter medications on file as of 03/17/2017.    Allergies  Allergen Reactions  . Cyclobenzaprine     Lost balance  . Lisinopril     Affected ability to urinate  . Relafen [Nabumetone]     Problems urinating afterwards   . Statins Other (See Comments)    "my peeing"   Patient Active Problem List   Diagnosis Date Noted  . Rectal bleeding 03/02/2017  . Alcoholic peripheral neuropathy ([place]) 11/07/2016  . B12 deficiency 11/07/2016  . S/P left TKA 10/14/2014  . DJD (degenerative joint disease) of knee 10/14/2014  . Hyperlipidemia 01/23/2013  .  Coronary atherosclerosis of native coronary artery 11/20/2012  . NICM (nonischemic cardiomyopathy) ([place]) 11/20/2012  . LBBB (left bundle branch block) 10/09/2012  . Chest pain 10/09/2012   Social History   Social History  . Marital status: Married    Spouse name: N/A  . Number of children: 3  . Years of education: N/A   Occupational History  . retired    Social History Main Topics  . Smoking status: Former Research scientist (life sciences)  . Smokeless tobacco: Never Used  . Alcohol use 0.0 oz/week     Comment: 2-3 drinks per day   .  Drug use: No  . Sexual activity: Not on file   Other Topics Concern  . Not on file   Social History Narrative   3 caffinated drinks per day.     Lives with wife at [place].  Has 3 children.     Retired from [employer].  Education: BA from [employer].     Mr. Reesman family history includes Diabetes type I in his son; Healthy in his father, mother, sister, and sister; Heart disease in his brother; Other in his sister; Thyroid cancer in his brother.      Objective:    Vitals:   03/17/17 1003  BP: 100/70  Pulse: 76    Physical Exam    well-developed older white male in no acute distress, pleasant blood pressure 100/70 pulse 76, weight 213, height 6 foot, BMI 28.8. HEENT; nontraumatic normocephalic EOMI PERRLA sclera anicteric, Cardiovascular; regular rate and rhythm with I6-N6 soft systolic murmur, Pulmonary; clear bilaterally, Abdomen; soft, nontender nondistended bowel sounds are active no palpable mass or hepatosplenomegaly, Rectal; exam not done, Extremities; no clubbing cyanosis or edema skin warm and dry, Neuropsych ;mood and affect appropriate      Assessment & Plan:   #59 83 year old white male seen today in post hospital follow-up after recent admission for post-polypectomy hemorrhage from a rectal polyp site. He had colonoscopy on 03/01/2017 with removal of 6  Sessile polyps ranging from 5-10 mm, one of which was in the rectum. Patient bled from an ulcer at the rectal polypectomy site and required flexible sigmoidoscopy with epi injection and Endo Clipping on 03/02/2017. Patient has been doing well with no further evidence of bleeding #2 anemia secondary to acute GI bleed-improving #3 multiple tubular adenomatous polyps-patient recommended to have follow-up in one year #4 nonischemic cardiomyopathy with EF of 40% #5 coronary artery disease # 6 peripheral neuropathy  Plan; patient will continue oral iron for another month and then stop. He may  resume baby  aspirin in 2 weeks Plan follow-up CBC in 2 weeks. He will need follow-up colonoscopy in 1 year with Dr. Henrene Pastor.   David Gutierrez Genia Harold PA-C 03/17/2017   Cc: Leanna Battles, MD

## 2017-03-20 ENCOUNTER — Ambulatory Visit: Payer: PPO | Admitting: Physician Assistant

## 2017-03-21 NOTE — Progress Notes (Signed)
Reviewed and agree.

## 2017-03-27 ENCOUNTER — Other Ambulatory Visit (INDEPENDENT_AMBULATORY_CARE_PROVIDER_SITE_OTHER): Payer: PPO

## 2017-03-27 DIAGNOSIS — D500 Iron deficiency anemia secondary to blood loss (chronic): Secondary | ICD-10-CM

## 2017-03-27 DIAGNOSIS — Z8719 Personal history of other diseases of the digestive system: Secondary | ICD-10-CM

## 2017-03-27 LAB — CBC WITH DIFFERENTIAL/PLATELET
BASOS PCT: 1.2 % (ref 0.0–3.0)
Basophils Absolute: 0.1 10*3/uL (ref 0.0–0.1)
Eosinophils Absolute: 0.3 10*3/uL (ref 0.0–0.7)
Eosinophils Relative: 4.5 % (ref 0.0–5.0)
HEMATOCRIT: 36.7 % — AB (ref 39.0–52.0)
HEMOGLOBIN: 12.2 g/dL — AB (ref 13.0–17.0)
Lymphocytes Relative: 25.7 % (ref 12.0–46.0)
Lymphs Abs: 1.4 10*3/uL (ref 0.7–4.0)
MCHC: 33.3 g/dL (ref 30.0–36.0)
MCV: 95.7 fl (ref 78.0–100.0)
MONO ABS: 0.6 10*3/uL (ref 0.1–1.0)
Monocytes Relative: 10.9 % (ref 3.0–12.0)
Neutro Abs: 3.2 10*3/uL (ref 1.4–7.7)
Neutrophils Relative %: 57.7 % (ref 43.0–77.0)
Platelets: 122 10*3/uL — ABNORMAL LOW (ref 150.0–400.0)
RBC: 3.84 Mil/uL — AB (ref 4.22–5.81)
RDW: 19.4 % — AB (ref 11.5–15.5)
WBC: 5.6 10*3/uL (ref 4.0–10.5)

## 2017-04-03 ENCOUNTER — Other Ambulatory Visit: Payer: Self-pay | Admitting: Cardiology

## 2017-04-03 ENCOUNTER — Ambulatory Visit (INDEPENDENT_AMBULATORY_CARE_PROVIDER_SITE_OTHER): Payer: PPO | Admitting: *Deleted

## 2017-04-03 DIAGNOSIS — E785 Hyperlipidemia, unspecified: Secondary | ICD-10-CM

## 2017-04-03 DIAGNOSIS — E538 Deficiency of other specified B group vitamins: Secondary | ICD-10-CM | POA: Diagnosis not present

## 2017-04-03 MED ORDER — CYANOCOBALAMIN 1000 MCG/ML IJ SOLN
1000.0000 ug | Freq: Once | INTRAMUSCULAR | Status: AC
  Administered 2017-04-03: 1000 ug via INTRAMUSCULAR

## 2017-04-13 ENCOUNTER — Other Ambulatory Visit: Payer: Self-pay | Admitting: Cardiology

## 2017-04-13 DIAGNOSIS — E785 Hyperlipidemia, unspecified: Secondary | ICD-10-CM

## 2017-05-08 ENCOUNTER — Encounter: Payer: Self-pay | Admitting: Neurology

## 2017-05-08 ENCOUNTER — Telehealth: Payer: Self-pay | Admitting: *Deleted

## 2017-05-08 ENCOUNTER — Other Ambulatory Visit (INDEPENDENT_AMBULATORY_CARE_PROVIDER_SITE_OTHER): Payer: PPO

## 2017-05-08 ENCOUNTER — Ambulatory Visit (INDEPENDENT_AMBULATORY_CARE_PROVIDER_SITE_OTHER): Payer: PPO | Admitting: Neurology

## 2017-05-08 VITALS — BP 108/60 | HR 70 | Ht 72.0 in | Wt 217.2 lb

## 2017-05-08 DIAGNOSIS — E538 Deficiency of other specified B group vitamins: Secondary | ICD-10-CM

## 2017-05-08 LAB — VITAMIN B12: VITAMIN B 12: 421 pg/mL (ref 211–911)

## 2017-05-08 NOTE — Progress Notes (Signed)
Follow-up Visit   Date: 05/08/17    David Gutierrez MRN: [id number] DOB: 1933-03-12   Interim History: David Gutierrez is a 83 y.o. right-handed Caucasian male with hypertension, CAD, GERD, hyperlipidemia, interstitial cystitis, s/p lumbar surgery (1952) and hypothyroidism returning to the clinic for follow-up of alcoholic neuropathy.  The patient was accompanied to the clinic by self.  History of present illness: A few years ago, he began noticing imbalance when climbing hills while playing golf.  He gave up playing golf late 2016 because of difficulty with balance.  He has not had any falls.  He walk independently, but when walking his dog he takes a hiking stick which helps. He stays very active and had a trainer as well as classes that he attends for balance.   Several years ago, he also noticed that he has numbness involving the feet.  He initially thought it was due to his crestor because symptoms got worse when he started this.  Despite stopping the medication, he continues to have symptoms. He drinks about 4-5oz vodka and occasionally wine for the past 20 years.  No history of diabetes.   UPDATE 11/07/2016:  He quit drinking alcohol for 4 months and noticed mild improvement in his feet paresthesias, but not enough for him to want to completely abstain from alcohol.  He has been using a foot massager with electrical stimulator, which provides some relief.  He was found to be vitamin B12 deficient and since taking supplements, his energy level has improved.  He has been compliant with using a cane, which he uses mostly when taking his dog for a walk.   UPDATE 05/08/2017:  He is here for follow-up visit.  He has resumed drinking two vodka's nightly and has noticed worsening imbalance.  He stumbles frequently, but has not suffered any falls.  He denies any painful tingling or burning sensation, however, numbness is persistent. He has been walking 72mile daily and has a Physiological scientist working  balance twice per week.  He has been compliant with thiamine and vitamin B12 supplementation.   Medications:  Current Outpatient Prescriptions on File Prior to Visit  Medication Sig Dispense Refill  . bethanechol (URECHOLINE) 25 MG tablet Take 25 mg by mouth 2 (two) times daily.    . carvedilol (COREG) 6.25 MG tablet TAKE ONE AND ONE-HALF TABLET TWICE DAILY 270 tablet 3  . diazepam (VALIUM) 5 MG tablet Take 2.5 mg by mouth 2 (two) times daily.     . diphenhydramine-acetaminophen (TYLENOL PM) 25-500 MG TABS tablet Take 1 tablet by mouth daily.    . iron polysaccharides (POLY-IRON 150) 150 MG capsule Take 150 mg by mouth daily.    Marland Kitchen levothyroxine (LEVOTHROID) 25 MCG tablet Take 25 mcg by mouth daily.    . Misc Natural Products (COLON CLEANSER PO) Take 3 capsules by mouth 2 (two) times daily.      Marland Kitchen omeprazole (PRILOSEC) 20 MG capsule TAKE ONE CAPSULE BY MOUTH ONCE DAILY 90 capsule 1  . rosuvastatin (CRESTOR) 5 MG tablet Take 5 mg by mouth daily.    Marland Kitchen thiamine (VITAMIN B-1) 100 MG tablet Take 1 tablet (100 mg total) by mouth daily. 90 tablet 2  . vitamin C (ASCORBIC ACID) 500 MG tablet Take 500 mg by mouth daily.     No current facility-administered medications on file prior to visit.     Allergies:  Allergies  Allergen Reactions  . Cyclobenzaprine     Lost balance  . Lisinopril  Affected ability to urinate  . Relafen [Nabumetone]     Problems urinating afterwards   . Statins Other (See Comments)    "my peeing"    Review of Systems:  CONSTITUTIONAL: No fevers, chills, night sweats, or weight loss.  EYES: No visual changes or eye pain ENT: No hearing changes.  No history of nose bleeds.   RESPIRATORY: No cough, wheezing and shortness of breath.   CARDIOVASCULAR: Negative for chest pain, and palpitations.   GI: Negative for abdominal discomfort, blood in stools or black stools.  No recent change in bowel habits.   GU:  No history of incontinence.   MUSCLOSKELETAL: No history of  joint pain or swelling.  No myalgias.   SKIN: Negative for lesions, rash, and itching.   ENDOCRINE: Negative for cold or heat intolerance, polydipsia or goiter.   PSYCH:  No depression or anxiety symptoms.   NEURO: As Above.   Vital Signs:  BP 108/60   Pulse 70   Ht 6' (1.829 m)   Wt 217 lb 3 oz (98.5 kg)   SpO2 95%   BMI 29.46 kg/m   Neurological Exam: MENTAL STATUS including orientation to time, place, person, recent and remote memory, attention span and concentration, language, and fund of knowledge is normal.  Speech is not dysarthric.  CRANIAL NERVES:  Pupils round and reactive to light.  Extraocular muscles intact.  Face is symmetric.  MOTOR:  Motor strength is 5/5 throughout including distally in the feet.  No pronator drift.  Tone is normal.    MSRs:  Right                                                                 Left brachioradialis 2+  brachioradialis 2+  biceps 2+  biceps 2+  triceps 2+  triceps 2+  patellar 1+  patellar 1+  ankle jerk 0  ankle jerk 0   SENSORY:  Vibration and light touch reduced distal to ankles bilaterally.  There is mild sway with Rhomberg testing.  COORDINATION/GAIT:   Gait narrow based and stable. He is very unsteady with tandem.   LABS: Labs 03/02/2016:  TSH 3.02, Na 142, K 4.7, Glucose 94, BUN 14, Cr 0.7, LFTs normal Labs 05/06/2016:  Vitamin B12 242, folate 12.2, copper 97, SPEP with IFE no M protein Lab Results  Component Value Date   VITAMINB12 >1500 (H) 11/07/2016    IMPRESSION: 1.  Alcoholic peripheral neuropathy manifesting with numbness of the feet and sensory ataxia.  He has resumed drinking vodka nightly and is aware that his neuropathy will continue to progress.  I recommended that he try to cut down to one drink nightly, if he cannot abstain, as to prevent worsening of symptoms.  Continue home exercises for balance and encouraged to use a cane as needed especially on uneven surfaces  2.  Vitamin B12 deficiency, he has  been managed with monthly injection for one year, so will be transitioned to oral vitamin B12 1072mcg daily.  B12 level will be checked today to be sure he does not need to continue parenteral supplementation.   3.  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    Return to clinic as needed.   The duration of this appointment  visit was 25 minutes of face-to-face time with the patient.  Greater than 50% of this time was spent in counseling, explanation of diagnosis, planning of further management, and coordination of care.   Thank you for allowing me to participate in patient's care.  If I can answer any additional questions, I would be pleased to do so.    Sincerely,    Donika K. Posey Pronto, DO

## 2017-05-08 NOTE — Telephone Encounter (Signed)
Left message giving patient results and instructions.   

## 2017-05-08 NOTE — Patient Instructions (Addendum)
1.  Start vitamin B12 1069mcg daily 2.  Continue vitamin B1 100mg  daily 3.  Encouraged to use a cane on uneven surfaces 4.  Check vitamin B12  Return to clinic as needed

## 2017-05-08 NOTE — Telephone Encounter (Signed)
-----   Message from Alda Berthold, DO sent at 05/08/2017 12:04 PM EDT ----- Please notify patient B12 is within normal limits, we'll stop injections and start B12 1040mcg by mouth.  Thank you.

## 2017-05-11 ENCOUNTER — Ambulatory Visit (INDEPENDENT_AMBULATORY_CARE_PROVIDER_SITE_OTHER): Payer: PPO | Admitting: Internal Medicine

## 2017-05-11 ENCOUNTER — Encounter: Payer: Self-pay | Admitting: Internal Medicine

## 2017-05-11 ENCOUNTER — Encounter (INDEPENDENT_AMBULATORY_CARE_PROVIDER_SITE_OTHER): Payer: Self-pay

## 2017-05-11 VITALS — BP 126/72 | HR 80 | Ht 72.0 in | Wt 215.0 lb

## 2017-05-11 DIAGNOSIS — I2510 Atherosclerotic heart disease of native coronary artery without angina pectoris: Secondary | ICD-10-CM | POA: Diagnosis not present

## 2017-05-11 DIAGNOSIS — E7800 Pure hypercholesterolemia, unspecified: Secondary | ICD-10-CM | POA: Diagnosis not present

## 2017-05-11 DIAGNOSIS — I428 Other cardiomyopathies: Secondary | ICD-10-CM

## 2017-05-11 MED ORDER — CARVEDILOL 6.25 MG PO TABS: 6.2500 mg | ORAL_TABLET | Freq: Two times a day (BID) | ORAL | 3 refills | Status: DC

## 2017-05-11 NOTE — Progress Notes (Signed)
Follow-up Outpatient Visit Date: 05/11/2017  Primary Care Provider: Leanna Battles, MD [place] [address]  Chief Complaint: Follow-up cardiomyopathy  HPI:  Mr. Metts is a 83 y.o. year-old male with history of nonischemic cardiomyopathy with chronic left bundle branch block, nonobstructive coronary artery disease, who presents for follow-up of cardiomyopathy. He was previously followed by Dr [name], having last been seen in 12/2015. At that time, he complained of some fatigue prompting transthoracic echocardiogram that revealed preserved LV function and no significant valvular abnormalities. Today, he notes that his fatigue has improved. His primary complaint is of persistent neuropathy involving both legs. This has improved somewhat since cessation of alcohol consumption. He also notes an episode of hematochezia following colonoscopy this brain. He expresses significant hemoglobin drop, though this is gradually improving. He has not had any chest pain, palpitations, lightheadedness, orthopnea, PND, shortness of breath, or edema. He notes that since his last visit with [place], carvedilol was decreased from 9.375 mg twice a day to 3.125 mg twice a day by his PCP. Mr. Rothermel is unsure of the reason for this dose decrease..  --------------------------------------------------------------------------------------------------  Cardiovascular History & Procedures: Cardiovascular Problems:  Nonischemic cardiomyopathy  Nonobstructive coronary artery disease  Risk Factors:  Known coronary artery disease, male gender, and age greater than 21  Cath/PCI:  LHC (11/07/12): LMCA normal. LAD with 30% proximal stenosis. D1 with 40% ostial disease. LCx with 30% ostial stenosis and diffuse 40-50% disease involving the distal vessel. Moderate-caliber ramus with luminal irregularities. Dominant RCA with 40% mid vessel stenosis. LVEF 50%.  CV Surgery:  None  EP Procedures and  Devices:  None  Non-Invasive Evaluation(s):  TTE (12/24/15): Normal LV size with mild focal basal hypertrophy of the septum. LVEF 55-60% with normal wall motion. Grade 1 diastolic dysfunction. Trivial aortic regurgitation. Mild mitral regurgitation. Moderate left atrial enlargement. Normal RV size and function.  Pharmacologic MPI (10/16/12): Fixed inferior and inferoseptal defect, which may reflect scar or artifact from left bundle-branch block. LVEF 38%. No ischemia.  Recent CV Pertinent Labs: Lab Results  Component Value Date   CHOL 145 03/24/2016   HDL 46 03/24/2016   LDLCALC 81 03/24/2016   TRIG 92 03/24/2016   CHOLHDL 3.2 03/24/2016   INR 1.09 03/01/2017   K 3.8 03/03/2017   MG 1.8 03/03/2017   BUN <5 (L) 03/03/2017   CREATININE 0.56 (L) 03/03/2017    Past medical and surgical history were reviewed and updated in EPIC.  Outpatient Encounter Prescriptions as of 05/11/2017  Medication Sig  . bethanechol (URECHOLINE) 25 MG tablet Take 25 mg by mouth 2 (two) times daily.  . carvedilol (COREG) 3.125 MG tablet   . carvedilol (COREG) 6.25 MG tablet TAKE ONE AND ONE-HALF TABLET TWICE DAILY  . diazepam (VALIUM) 5 MG tablet Take 2.5 mg by mouth 2 (two) times daily.   . diphenhydramine-acetaminophen (TYLENOL PM) 25-500 MG TABS tablet Take 1 tablet by mouth daily.  . iron polysaccharides (POLY-IRON 150) 150 MG capsule Take 150 mg by mouth daily.  Marland Kitchen levothyroxine (LEVOTHROID) 25 MCG tablet Take 25 mcg by mouth daily.  . Misc Natural Products (COLON CLEANSER PO) Take 3 capsules by mouth 2 (two) times daily.    Marland Kitchen omeprazole (PRILOSEC) 20 MG capsule TAKE ONE CAPSULE BY MOUTH ONCE DAILY  . rosuvastatin (CRESTOR) 5 MG tablet Take 5 mg by mouth daily.  Marland Kitchen thiamine (VITAMIN B-1) 100 MG tablet Take 1 tablet (100 mg total) by mouth daily.  . vitamin C (ASCORBIC ACID)  500 MG tablet Take 500 mg by mouth daily.   No facility-administered encounter medications on file as of 05/11/2017.      Allergies: Cyclobenzaprine; Lisinopril; Relafen [nabumetone]; and Statins  Social History   Social History  . Marital status: Married    Spouse name: N/A  . Number of children: 3  . Years of education: N/A   Occupational History  . retired    Social History Main Topics  . Smoking status: Former Research scientist (life sciences)  . Smokeless tobacco: Never Used  . Alcohol use 0.0 oz/week     Comment: 2-3 drinks per day   . Drug use: No  . Sexual activity: Not on file   Other Topics Concern  . Not on file   Social History Narrative   3 caffinated drinks per day.     Lives with wife at [place].  Has 3 children.     Retired from [employer].  Education: BA from [employer].     Family History  Problem Relation Age of Onset  . Thyroid cancer Brother   . Healthy Mother   . Healthy Father   . Other Sister   . Heart disease Brother   . Healthy Sister   . Healthy Sister   . Diabetes type I Son   . Colon cancer Neg Hx     Review of Systems: A 12-system review of systems was performed and was negative except as noted in the HPI.  --------------------------------------------------------------------------------------------------  Physical Exam: BP 126/72   Pulse 80   Ht 6' (1.829 m)   Wt 215 lb (97.5 kg)   SpO2 96%   BMI 29.16 kg/m   General:  Overweight, elderly man, seated comfortably in the exam room. HEENT: No conjunctival pallor or scleral icterus.  Moist mucous membranes.  OP clear. Neck: Supple without lymphadenopathy, thyromegaly, JVD, or HJR.  No carotid bruit. Lungs: Normal work of breathing.  Clear to auscultation bilaterally without wheezes or crackles. Heart: Regular rate and rhythm without murmurs, rubs, or gallops.  Non-displaced PMI. Abd: Bowel sounds present.  Soft, NT/ND without hepatosplenomegaly Ext: No lower extremity edema.  Radial, PT, and DP pulses are 2+ bilaterally. Skin: warm and dry without rash  EKG:  Normal sinus rhythm with first-degree AV block,  left bundle branch block, and occasional PVCs. PR interval has lengthened slightly from prior tracing. PVCs are also new. Otherwise, there has been no significant interval change.  Lab Results  Component Value Date   WBC 5.6 03/27/2017   HGB 12.2 (L) 03/27/2017   HCT 36.7 (L) 03/27/2017   MCV 95.7 03/27/2017   PLT 122.0 (L) 03/27/2017    Lab Results  Component Value Date   NA 140 03/03/2017   K 3.8 03/03/2017   CL 111 03/03/2017   CO2 25 03/03/2017   BUN <5 (L) 03/03/2017   CREATININE 0.56 (L) 03/03/2017   GLUCOSE 102 (H) 03/03/2017   ALT 11 (L) 03/03/2017    Lab Results  Component Value Date   CHOL 145 03/24/2016   HDL 46 03/24/2016   LDLCALC 81 03/24/2016   TRIG 92 03/24/2016   CHOLHDL 3.2 03/24/2016   --------------------------------------------------------------------------------------------------  ASSESSMENT AND PLAN: Nonischemic cardiomyopathy Mr. Saville appears euvolemic and well compensated. His LVEF has normalized based on most recent echo last January. He is currently only on carvedilol 3.125 mg twice a day for evidence-based heart failure therapy. We have agreed to increase this to 6.25 mg twice a day, though we will need to monitor her  for worsening first-degree AV block in the setting of his underlying conduction disease. He has been intolerant of ACI inhibitors and ARB's in the past due to worsening of interstitial cystitis.  Coronary artery disease without angina LHC in 2013 revealed mild to moderate nonobstructive CAD. Mr. Luis does not have any symptoms to suggest worsening coronary insufficiency. We will continue his current medication regimen to prevent progression of disease.  Hyperlipidemia Lipid panel in 03/2016 was notable for an LDL of 81. Mr. Lanz is currently on low-dose rosuvastatin, being managed by his PCP. I will defer further management to Dr. Sharlett Iles. Even CAD noted on prior cath, I suggest a target LDL less than 100, ideally below  70.  Follow-up: Return to clinic in 6 months.  Nelva Bush, MD 05/11/2017 8:26 PM

## 2017-05-11 NOTE — Patient Instructions (Signed)
Medication Instructions:  Increase coreg (carvedilol) to 6.25mg  two times a day. You can take 2 of your 3.125mg  tablets two times a day and use your current supply.  Labwork: None  Testing/Procedures: None   Follow-Up: Your physician wants you to follow-up in: 6 months with Dr End. (December 2018). You will receive a reminder letter in the mail two months in advance. If you don't receive a letter, please call our office to schedule the follow-up appointment.       If you need a refill on your cardiac medications before your next appointment, please call your pharmacy.

## 2017-05-15 ENCOUNTER — Other Ambulatory Visit: Payer: Self-pay | Admitting: Cardiology

## 2017-05-15 DIAGNOSIS — E785 Hyperlipidemia, unspecified: Secondary | ICD-10-CM

## 2017-05-17 DIAGNOSIS — N3010 Interstitial cystitis (chronic) without hematuria: Secondary | ICD-10-CM | POA: Diagnosis not present

## 2017-05-17 DIAGNOSIS — R351 Nocturia: Secondary | ICD-10-CM | POA: Diagnosis not present

## 2017-05-17 DIAGNOSIS — N401 Enlarged prostate with lower urinary tract symptoms: Secondary | ICD-10-CM | POA: Diagnosis not present

## 2017-05-17 DIAGNOSIS — N312 Flaccid neuropathic bladder, not elsewhere classified: Secondary | ICD-10-CM | POA: Diagnosis not present

## 2017-05-17 DIAGNOSIS — E291 Testicular hypofunction: Secondary | ICD-10-CM | POA: Diagnosis not present

## 2017-06-01 DIAGNOSIS — K219 Gastro-esophageal reflux disease without esophagitis: Secondary | ICD-10-CM | POA: Diagnosis not present

## 2017-06-01 DIAGNOSIS — J3081 Allergic rhinitis due to animal (cat) (dog) hair and dander: Secondary | ICD-10-CM | POA: Diagnosis not present

## 2017-06-01 DIAGNOSIS — J3089 Other allergic rhinitis: Secondary | ICD-10-CM | POA: Diagnosis not present

## 2017-06-01 DIAGNOSIS — J301 Allergic rhinitis due to pollen: Secondary | ICD-10-CM | POA: Diagnosis not present

## 2017-07-10 ENCOUNTER — Other Ambulatory Visit: Payer: Self-pay | Admitting: Internal Medicine

## 2017-07-10 DIAGNOSIS — R1310 Dysphagia, unspecified: Secondary | ICD-10-CM

## 2017-07-10 DIAGNOSIS — K2100 Gastro-esophageal reflux disease with esophagitis, without bleeding: Secondary | ICD-10-CM

## 2017-07-18 DIAGNOSIS — N312 Flaccid neuropathic bladder, not elsewhere classified: Secondary | ICD-10-CM | POA: Diagnosis not present

## 2017-07-18 DIAGNOSIS — N320 Bladder-neck obstruction: Secondary | ICD-10-CM | POA: Diagnosis not present

## 2017-07-18 DIAGNOSIS — N281 Cyst of kidney, acquired: Secondary | ICD-10-CM | POA: Diagnosis not present

## 2017-07-20 DIAGNOSIS — N3001 Acute cystitis with hematuria: Secondary | ICD-10-CM | POA: Diagnosis not present

## 2017-07-20 DIAGNOSIS — N3000 Acute cystitis without hematuria: Secondary | ICD-10-CM | POA: Diagnosis not present

## 2017-07-26 DIAGNOSIS — G6289 Other specified polyneuropathies: Secondary | ICD-10-CM | POA: Diagnosis not present

## 2017-07-26 DIAGNOSIS — I10 Essential (primary) hypertension: Secondary | ICD-10-CM | POA: Diagnosis not present

## 2017-07-26 DIAGNOSIS — M545 Low back pain: Secondary | ICD-10-CM | POA: Diagnosis not present

## 2017-07-26 DIAGNOSIS — Z6828 Body mass index (BMI) 28.0-28.9, adult: Secondary | ICD-10-CM | POA: Diagnosis not present

## 2017-07-26 DIAGNOSIS — N3010 Interstitial cystitis (chronic) without hematuria: Secondary | ICD-10-CM | POA: Diagnosis not present

## 2017-07-26 DIAGNOSIS — I2510 Atherosclerotic heart disease of native coronary artery without angina pectoris: Secondary | ICD-10-CM | POA: Diagnosis not present

## 2017-08-02 ENCOUNTER — Telehealth: Payer: Self-pay

## 2017-08-02 NOTE — Telephone Encounter (Signed)
Spoke with patient and told him that Dr. Henrene Pastor had received his letter questioning when his next colonoscopy was due and had reviewed his chart.  I told him that per Dr. Henrene Pastor, patient is not due for another colonoscopy until 02/02/2018.  Patient verbalized understanding

## 2017-08-03 DIAGNOSIS — N401 Enlarged prostate with lower urinary tract symptoms: Secondary | ICD-10-CM | POA: Diagnosis not present

## 2017-08-03 DIAGNOSIS — R3914 Feeling of incomplete bladder emptying: Secondary | ICD-10-CM | POA: Diagnosis not present

## 2017-08-10 DIAGNOSIS — N312 Flaccid neuropathic bladder, not elsewhere classified: Secondary | ICD-10-CM | POA: Diagnosis not present

## 2017-08-10 DIAGNOSIS — R3914 Feeling of incomplete bladder emptying: Secondary | ICD-10-CM | POA: Diagnosis not present

## 2017-08-10 DIAGNOSIS — N401 Enlarged prostate with lower urinary tract symptoms: Secondary | ICD-10-CM | POA: Diagnosis not present

## 2017-08-10 DIAGNOSIS — R351 Nocturia: Secondary | ICD-10-CM | POA: Diagnosis not present

## 2017-08-29 DIAGNOSIS — R3914 Feeling of incomplete bladder emptying: Secondary | ICD-10-CM | POA: Diagnosis not present

## 2017-08-30 DIAGNOSIS — N139 Obstructive and reflux uropathy, unspecified: Secondary | ICD-10-CM | POA: Diagnosis not present

## 2017-08-30 DIAGNOSIS — N312 Flaccid neuropathic bladder, not elsewhere classified: Secondary | ICD-10-CM | POA: Diagnosis not present

## 2017-08-30 DIAGNOSIS — R3914 Feeling of incomplete bladder emptying: Secondary | ICD-10-CM | POA: Diagnosis not present

## 2017-09-18 DIAGNOSIS — D485 Neoplasm of uncertain behavior of skin: Secondary | ICD-10-CM | POA: Diagnosis not present

## 2017-09-18 DIAGNOSIS — L821 Other seborrheic keratosis: Secondary | ICD-10-CM | POA: Diagnosis not present

## 2017-09-18 DIAGNOSIS — L812 Freckles: Secondary | ICD-10-CM | POA: Diagnosis not present

## 2017-09-18 DIAGNOSIS — L820 Inflamed seborrheic keratosis: Secondary | ICD-10-CM | POA: Diagnosis not present

## 2017-09-18 DIAGNOSIS — Z85828 Personal history of other malignant neoplasm of skin: Secondary | ICD-10-CM | POA: Diagnosis not present

## 2017-09-28 DIAGNOSIS — R3914 Feeling of incomplete bladder emptying: Secondary | ICD-10-CM | POA: Diagnosis not present

## 2017-09-28 DIAGNOSIS — N139 Obstructive and reflux uropathy, unspecified: Secondary | ICD-10-CM | POA: Diagnosis not present

## 2017-09-28 DIAGNOSIS — N312 Flaccid neuropathic bladder, not elsewhere classified: Secondary | ICD-10-CM | POA: Diagnosis not present

## 2017-10-30 DIAGNOSIS — N312 Flaccid neuropathic bladder, not elsewhere classified: Secondary | ICD-10-CM | POA: Diagnosis not present

## 2017-10-30 DIAGNOSIS — N139 Obstructive and reflux uropathy, unspecified: Secondary | ICD-10-CM | POA: Diagnosis not present

## 2017-10-30 DIAGNOSIS — R3914 Feeling of incomplete bladder emptying: Secondary | ICD-10-CM | POA: Diagnosis not present

## 2017-11-02 DIAGNOSIS — R3914 Feeling of incomplete bladder emptying: Secondary | ICD-10-CM | POA: Diagnosis not present

## 2017-11-02 DIAGNOSIS — R351 Nocturia: Secondary | ICD-10-CM | POA: Diagnosis not present

## 2017-11-02 DIAGNOSIS — N401 Enlarged prostate with lower urinary tract symptoms: Secondary | ICD-10-CM | POA: Diagnosis not present

## 2017-11-10 ENCOUNTER — Other Ambulatory Visit: Payer: Self-pay | Admitting: Urology

## 2017-11-29 ENCOUNTER — Other Ambulatory Visit: Payer: Self-pay

## 2017-11-29 ENCOUNTER — Encounter (HOSPITAL_BASED_OUTPATIENT_CLINIC_OR_DEPARTMENT_OTHER): Payer: Self-pay | Admitting: *Deleted

## 2017-11-29 DIAGNOSIS — N139 Obstructive and reflux uropathy, unspecified: Secondary | ICD-10-CM | POA: Diagnosis not present

## 2017-11-29 DIAGNOSIS — N312 Flaccid neuropathic bladder, not elsewhere classified: Secondary | ICD-10-CM | POA: Diagnosis not present

## 2017-11-29 NOTE — Progress Notes (Signed)
SPOKE W/ PT VIA PHONE FOR PRE-OP INTERVIEW.  NPO AFTER MN.  ARRIVE AT 0630.  NEEDS ISTAT.  CURRENT EKG IN CHART AND Epic.  WILL TAKE COREG AND VALIUM AM DOS W/ SIPS OF WATER.   REVIEW [facility] GUIDELINES W/ PT.  PT WILL BRING HOME MEDICATIONS.

## 2017-12-04 NOTE — H&P (Signed)
Urology Preoperative H&P   Chief Complaint: Incomplete bladder emptying  History of Present Illness: David Gutierrez is a 83 y.o. male with a long history of BPH/lower urinary tract symptoms and incomplete bladder emptying.  He has been requiring intermittent catheterization several times per week secondary to incomplete emptying and recurrent urinary tract infections.  He was taking Cipro 500 mg prior to each catheterization, but began having side effects from it and was switched to trimethoprim.  Recent UDS showed that the patient has normal compliance during the filling phase with a first sensation at 460 mL.  No involuntary detrusor contractions noted.  He is generating adequate detrusor pressures when given permission to void, but only able to void a small volume.  His cystogram/VCUG revealed a smooth contour of his bladder with an open bladder neck, but a contracted external sphincter.  No evidence of vesicoureteral reflux on VCUG.  He had a normal EMG activity throughout the study.    Cystoscopy performed in the office showed an obstructing and bilobar prostate with bladder trabeculation seen throughout.  He denies interval urinary tract infections, dysuria or hematuria.  He also reports no difficulty catheterizing himself.  Past Medical History:  Diagnosis Date  . Alcoholic peripheral neuropathy [facility])    neurologist-  dr patel--- feet numbness and sensory ataxia  . Arthritis   . B12 deficiency   . BPH (benign prostatic hyperplasia)   . CAD (coronary artery disease) cardiolgoist-  dr Harrell Gave end (previouly dr dalton [name])   a. Myoview 11/13:  EF 38%, inf and IS defect c/w scar but no ischemia:  b. Cardiac CT 11/13:  Ca score 318 Agatson units, pLAD and dCFX plaque;   c. LHC 11/07/12:  pLAD 30%, oD1 40%, oCFX 30%, dCFX 40-50%, mRCA 40%, EF 50% (frequent PVCs and short run of NSVT with injection/    per last echo 01/ 2017  ef 55-60%  . Chronic constipation   . Diverticulosis of colon   .  Esophageal reflux   . External hemorrhoids   . First degree heart block   . Hiatal hernia   . History of adenomatous polyp of colon   . History of esophageal stricture 08/11/2015   s/p  dilatation  . History of lower GI bleeding 03/01/2017   s/p  flexiable simoidscopy w/ clipping rectum ulcer  . Hypogonadism male   . Hypothyroidism   . Interstitial cystitis   . Irritable bowel syndrome   . LBBB (left bundle branch block)   . NICM (nonischemic cardiomyopathy) [facility]) cardiologist-  dr Harrell Gave end (previously dr dalton [name])--  per last echo 01/ 2017  ef 55-60%   ? 2/2 LBBB => a. echo 11/13: diff HK, worse in septum and apex, mod LVE, mild LVH, EF 40%, mild AI, mild MR, mod LAE  . Self-catheterizes urinary bladder    bid to tid   . Wears glasses   . Wears hearing aid in both ears     Past Surgical History:  Procedure Laterality Date  . CARDIAC CATHETERIZATION  11-07-2012    dr [name]   nonobstructive CAD (pLAD 30%, ostial D1 40%, ostial LCx 30%, dLCx diffuse 40-50%, mRCA 40%) ;  LVSF 50% but diffiult due to PVCs and short run VT with injection;  cardiomyopathy mostly likely a LBBB cardiomyopathy  . CARDIOVASCULAR STRESS TEST  10-16-2012   dr [name]   Low risk adenosine nuclear study (no exercise) w/ a fixed inferior and inferoseptal defect without ischemia (question as whether this abnormality  due to LBBB cardiomyopathy or due to scar)/  LV ef 38%,  LV wall motion decreased of the septum and entire apex  . CATARACT EXTRACTION W/ INTRAOCULAR LENS  IMPLANT, BILATERAL  2012  approx.  . CYSTO/  HYDRODISTENTION/  BLADDER BIOPSY  04-20-2005    dr Lawerance Bach  [facility]  . ETHMOIDECTOMY Right 12/12/2016   Procedure: RIGHT ENDOSCOPIC ETHMOIDECTOMY;  Surgeon: [name], MD;  Location: [place];  Service: ENT;  Laterality: Right;  . FLEXIBLE SIGMOIDOSCOPY N/A 03/02/2017   Procedure: FLEXIBLE SIGMOIDOSCOPY;  Surgeon: Ladene Artist, MD;  Location: [facility];  Service: Endoscopy;   Laterality: N/A;  . [place]  . MAXILLARY ANTROSTOMY Right 12/12/2016   Procedure: RIGHT ENDOSCOPIC MAXILLARY ANTROSTOMY;  Surgeon: [name], MD;  Location: [place];  Service: ENT;  Laterality: Right;  . SINUS ENDO W/FUSION Right 12/12/2016   Procedure: ENDOSCOPIC SINUS SURGERY WITH NAVIGATION;  Surgeon: [name], MD;  Location: [place];  Service: ENT;  Laterality: Right;  . SPHENOIDECTOMY Right 12/12/2016   Procedure: RIGHT ENDOSCOPIC SPHENOIDECTOMY;  Surgeon: [name], MD;  Location: [place];  Service: ENT;  Laterality: Right;  . TONSILLECTOMY AND ADENOIDECTOMY  child  . TOTAL KNEE ARTHROPLASTY Left 10/14/2014   Procedure: LEFT TOTAL KNEE ARTHROPLASTY;  Surgeon: [name], MD;  Location: [facility];  Service: Orthopedics;  Laterality: Left;  . TOTAL KNEE ARTHROPLASTY Right 1990s  . TRANSTHORACIC ECHOCARDIOGRAM  12-24-2015   dr [name]   ef 55-60%,  grade 1 diastolic dysfunction/  trivial AR and TR  mild MR and PR/  moderate LAE  . TRANSURETHRAL RESECTION OF PROSTATE  06-11-2010   dr Gaynelle Arabian  [facility]   w/  GYRUS    Allergies:  Allergies  Allergen Reactions  . Cyclobenzaprine Other (See Comments)    Lost balance  . Lisinopril Other (See Comments)    Affected ability to urinate  . Relafen [Nabumetone] Other (See Comments)    Problems urinating afterwards   . Statins Other (See Comments)    "my peeing"    Family History  Problem Relation Age of Onset  . Thyroid cancer Brother   . Healthy Mother   . Healthy Father   . Other Sister   . Heart disease Brother   . Healthy Sister   . Healthy Sister   . Diabetes type I Son   . Colon cancer Neg Hx     Social History:  reports that he quit smoking about 50 years ago. His smoking use included cigarettes. He has a 30.00 pack-year smoking history. he has never used smokeless tobacco. He reports that he drinks alcohol. He reports that he does not use drugs.  ROS: A complete  review of systems was performed.  All systems are negative except for pertinent findings as noted.  Physical Exam:  Vital signs in last 24 hours:   Constitutional:  Alert and oriented, No acute distress Cardiovascular: Regular rate and rhythm, No JVD Respiratory: Normal respiratory effort, Lungs clear bilaterally GI: Abdomen is soft, nontender, nondistended, no abdominal masses GU: No CVA tenderness Lymphatic: No lymphadenopathy Neurologic: Grossly intact, no focal deficits Psychiatric: Normal mood and affect  Laboratory Data:  No results for input(s): WBC, HGB, HCT, PLT in the last 72 hours.  No results for input(s): NA, K, CL, GLUCOSE, BUN, CALCIUM, CREATININE in the last 72 hours.  Invalid input(s): CO3   No results found for this or any previous visit (  from the past 24 hour(s)). No results found for this or any previous visit (from the past 240 hour(s)).  Renal Function: No results for input(s): CREATININE in the last 168 hours. CrCl cannot be calculated (Patient's most recent lab result is older than the maximum 21 days allowed.).  Radiologic Imaging: No results found.  I independently reviewed the above imaging studies.  Assessment and Plan David Gutierrez is a 83 y.o. male with BPH with incomplete bladder emptying requiring CIC and recurrent UTIs  -The risks, benefits and alternatives of cystoscopy with bipolar TURP was discussed with the patient.  Risks include, but are not limited to bleeding, urinary tract infection, prolonged catheterization, urethral stricture disease, bladder perforation, minimal improvement or worsening of his voiding symptoms and the inherent risk of general anesthesia.  He voices understanding and wishes to proceed.   Ellison Hughs, MD 12/04/2017, 4:53 PM  [facility] Pager: [phone]

## 2017-12-06 ENCOUNTER — Encounter (HOSPITAL_BASED_OUTPATIENT_CLINIC_OR_DEPARTMENT_OTHER)
Admission: RE | Disposition: A | Discharge status: Home / Self Care | Payer: Self-pay | Source: Ambulatory Visit | Attending: Urology

## 2017-12-06 ENCOUNTER — Ambulatory Visit (HOSPITAL_BASED_OUTPATIENT_CLINIC_OR_DEPARTMENT_OTHER): Payer: PPO | Admitting: Anesthesiology

## 2017-12-06 ENCOUNTER — Encounter (HOSPITAL_BASED_OUTPATIENT_CLINIC_OR_DEPARTMENT_OTHER): Payer: Self-pay | Admitting: Anesthesiology

## 2017-12-06 ENCOUNTER — Other Ambulatory Visit: Payer: Self-pay

## 2017-12-06 ENCOUNTER — Observation Stay (HOSPITAL_BASED_OUTPATIENT_CLINIC_OR_DEPARTMENT_OTHER)
Admission: RE | Admit: 2017-12-06 | Discharge: 2017-12-07 | Disposition: A | Discharge status: Home / Self Care | Payer: PPO | Source: Ambulatory Visit | Attending: Urology | Admitting: Urology

## 2017-12-06 DIAGNOSIS — N138 Other obstructive and reflux uropathy: Secondary | ICD-10-CM | POA: Diagnosis present

## 2017-12-06 DIAGNOSIS — N390 Urinary tract infection, site not specified: Secondary | ICD-10-CM | POA: Diagnosis not present

## 2017-12-06 DIAGNOSIS — Z87891 Personal history of nicotine dependence: Secondary | ICD-10-CM | POA: Diagnosis not present

## 2017-12-06 DIAGNOSIS — E538 Deficiency of other specified B group vitamins: Secondary | ICD-10-CM | POA: Insufficient documentation

## 2017-12-06 DIAGNOSIS — Z8719 Personal history of other diseases of the digestive system: Secondary | ICD-10-CM | POA: Insufficient documentation

## 2017-12-06 DIAGNOSIS — I440 Atrioventricular block, first degree: Secondary | ICD-10-CM | POA: Insufficient documentation

## 2017-12-06 DIAGNOSIS — Z87440 Personal history of urinary (tract) infections: Secondary | ICD-10-CM | POA: Diagnosis not present

## 2017-12-06 DIAGNOSIS — I2510 Atherosclerotic heart disease of native coronary artery without angina pectoris: Secondary | ICD-10-CM | POA: Diagnosis not present

## 2017-12-06 DIAGNOSIS — E039 Hypothyroidism, unspecified: Secondary | ICD-10-CM | POA: Diagnosis not present

## 2017-12-06 DIAGNOSIS — N401 Enlarged prostate with lower urinary tract symptoms: Principal | ICD-10-CM | POA: Insufficient documentation

## 2017-12-06 DIAGNOSIS — Z79899 Other long term (current) drug therapy: Secondary | ICD-10-CM | POA: Diagnosis not present

## 2017-12-06 DIAGNOSIS — I493 Ventricular premature depolarization: Secondary | ICD-10-CM | POA: Diagnosis not present

## 2017-12-06 DIAGNOSIS — M1990 Unspecified osteoarthritis, unspecified site: Secondary | ICD-10-CM | POA: Diagnosis not present

## 2017-12-06 DIAGNOSIS — N3289 Other specified disorders of bladder: Secondary | ICD-10-CM | POA: Diagnosis not present

## 2017-12-06 DIAGNOSIS — I429 Cardiomyopathy, unspecified: Secondary | ICD-10-CM | POA: Insufficient documentation

## 2017-12-06 DIAGNOSIS — Z888 Allergy status to other drugs, medicaments and biological substances status: Secondary | ICD-10-CM | POA: Insufficient documentation

## 2017-12-06 DIAGNOSIS — G621 Alcoholic polyneuropathy: Secondary | ICD-10-CM | POA: Insufficient documentation

## 2017-12-06 DIAGNOSIS — R338 Other retention of urine: Secondary | ICD-10-CM | POA: Diagnosis not present

## 2017-12-06 DIAGNOSIS — Z86010 Personal history of colonic polyps: Secondary | ICD-10-CM | POA: Insufficient documentation

## 2017-12-06 DIAGNOSIS — K219 Gastro-esophageal reflux disease without esophagitis: Secondary | ICD-10-CM | POA: Insufficient documentation

## 2017-12-06 DIAGNOSIS — N400 Enlarged prostate without lower urinary tract symptoms: Secondary | ICD-10-CM | POA: Diagnosis not present

## 2017-12-06 DIAGNOSIS — I447 Left bundle-branch block, unspecified: Secondary | ICD-10-CM | POA: Insufficient documentation

## 2017-12-06 DIAGNOSIS — K589 Irritable bowel syndrome without diarrhea: Secondary | ICD-10-CM | POA: Diagnosis not present

## 2017-12-06 DIAGNOSIS — R3914 Feeling of incomplete bladder emptying: Secondary | ICD-10-CM | POA: Diagnosis not present

## 2017-12-06 DIAGNOSIS — Z96651 Presence of right artificial knee joint: Secondary | ICD-10-CM | POA: Diagnosis not present

## 2017-12-06 DIAGNOSIS — E291 Testicular hypofunction: Secondary | ICD-10-CM | POA: Diagnosis not present

## 2017-12-06 DIAGNOSIS — Z7982 Long term (current) use of aspirin: Secondary | ICD-10-CM | POA: Insufficient documentation

## 2017-12-06 HISTORY — DX: Personal history of adenomatous and serrated colon polyps: Z86.0101

## 2017-12-06 HISTORY — DX: Presence of external hearing-aid: Z97.4

## 2017-12-06 HISTORY — DX: Deficiency of other specified B group vitamins: E53.8

## 2017-12-06 HISTORY — DX: Personal history of other diseases of the digestive system: Z87.19

## 2017-12-06 HISTORY — DX: Alcoholic polyneuropathy: G62.1

## 2017-12-06 HISTORY — PX: TRANSURETHRAL RESECTION OF PROSTATE: SHX73

## 2017-12-06 HISTORY — DX: Atrioventricular block, first degree: I44.0

## 2017-12-06 HISTORY — DX: Other specified health status: Z78.9

## 2017-12-06 HISTORY — DX: Presence of spectacles and contact lenses: Z97.3

## 2017-12-06 HISTORY — DX: Personal history of colonic polyps: Z86.010

## 2017-12-06 HISTORY — DX: Other constipation: K59.09

## 2017-12-06 HISTORY — DX: Diverticulosis of large intestine without perforation or abscess without bleeding: K57.30

## 2017-12-06 HISTORY — DX: Benign prostatic hyperplasia without lower urinary tract symptoms: N40.0

## 2017-12-06 HISTORY — DX: Hypothyroidism, unspecified: E03.9

## 2017-12-06 LAB — POCT I-STAT 4, (NA,K, GLUC, HGB,HCT)
Glucose, Bld: 93 mg/dL (ref 65–99)
HEMATOCRIT: 39 % (ref 39.0–52.0)
Hemoglobin: 13.3 g/dL (ref 13.0–17.0)
Potassium: 3.8 mmol/L (ref 3.5–5.1)
SODIUM: 142 mmol/L (ref 135–145)

## 2017-12-06 SURGERY — TURP (TRANSURETHRAL RESECTION OF PROSTATE)
Anesthesia: General

## 2017-12-06 MED ORDER — OXYBUTYNIN CHLORIDE 5 MG PO TABS
5.0000 mg | ORAL_TABLET | Freq: Three times a day (TID) | ORAL | Status: DC | PRN
  Administered 2017-12-06: 5 mg via ORAL
  Filled 2017-12-06: qty 1

## 2017-12-06 MED ORDER — HYDROCODONE-ACETAMINOPHEN 5-325 MG PO TABS
1.0000 | ORAL_TABLET | ORAL | Status: DC | PRN
  Administered 2017-12-06: 2 via ORAL
  Administered 2017-12-06 (×2): 1 via ORAL
  Filled 2017-12-06: qty 2

## 2017-12-06 MED ORDER — FENTANYL CITRATE (PF) 100 MCG/2ML IJ SOLN
INTRAMUSCULAR | Status: AC
Start: 2017-12-06 — End: 2017-12-06
  Filled 2017-12-06: qty 2

## 2017-12-06 MED ORDER — ONDANSETRON HCL 4 MG/2ML IJ SOLN
4.0000 mg | INTRAMUSCULAR | Status: DC | PRN
  Filled 2017-12-06: qty 2

## 2017-12-06 MED ORDER — DEXAMETHASONE SODIUM PHOSPHATE 10 MG/ML IJ SOLN
INTRAMUSCULAR | Status: AC
  Filled 2017-12-06: qty 1

## 2017-12-06 MED ORDER — SODIUM CHLORIDE 0.9 % IR SOLN
3000.0000 mL | Status: DC
  Administered 2017-12-06 (×2): 3000 mL
  Filled 2017-12-06: qty 3000

## 2017-12-06 MED ORDER — BELLADONNA ALKALOIDS-OPIUM 16.2-60 MG RE SUPP
RECTAL | Status: AC
  Filled 2017-12-06: qty 1

## 2017-12-06 MED ORDER — PHENAZOPYRIDINE HCL 200 MG PO TABS: 200.0000 mg | ORAL_TABLET | Freq: Three times a day (TID) | ORAL | 0 refills | Status: DC | PRN

## 2017-12-06 MED ORDER — CEFAZOLIN SODIUM-DEXTROSE 1-4 GM/50ML-% IV SOLN
1.0000 g | Freq: Three times a day (TID) | INTRAVENOUS | Status: DC
  Administered 2017-12-06 – 2017-12-07 (×2): 1 g via INTRAVENOUS
  Filled 2017-12-06 (×2): qty 50

## 2017-12-06 MED ORDER — ONDANSETRON HCL 4 MG/2ML IJ SOLN
INTRAMUSCULAR | Status: AC
  Filled 2017-12-06: qty 2

## 2017-12-06 MED ORDER — OXYBUTYNIN CHLORIDE 5 MG PO TABS
ORAL_TABLET | ORAL | Status: AC
  Filled 2017-12-06: qty 1

## 2017-12-06 MED ORDER — FENTANYL CITRATE (PF) 100 MCG/2ML IJ SOLN
INTRAMUSCULAR | Status: DC | PRN
  Administered 2017-12-06 (×4): 25 ug via INTRAVENOUS

## 2017-12-06 MED ORDER — BELLADONNA ALKALOIDS-OPIUM 16.2-60 MG RE SUPP
RECTAL | Status: DC | PRN
  Administered 2017-12-06: 1 via RECTAL

## 2017-12-06 MED ORDER — PROPOFOL 10 MG/ML IV BOLUS
INTRAVENOUS | Status: AC
  Filled 2017-12-06: qty 40

## 2017-12-06 MED ORDER — SULFAMETHOXAZOLE-TRIMETHOPRIM 800-160 MG PO TABS: 1.0000 | ORAL_TABLET | Freq: Two times a day (BID) | ORAL | 0 refills | Status: AC

## 2017-12-06 MED ORDER — LIDOCAINE 2% (20 MG/ML) 5 ML SYRINGE
INTRAMUSCULAR | Status: DC | PRN
  Administered 2017-12-06: 100 mg via INTRAVENOUS

## 2017-12-06 MED ORDER — CEFAZOLIN SODIUM-DEXTROSE 2-4 GM/100ML-% IV SOLN
INTRAVENOUS | Status: AC
  Filled 2017-12-06: qty 100

## 2017-12-06 MED ORDER — MORPHINE SULFATE (PF) 2 MG/ML IV SOLN
2.0000 mg | INTRAVENOUS | Status: DC | PRN
  Filled 2017-12-06: qty 2

## 2017-12-06 MED ORDER — FENTANYL CITRATE (PF) 100 MCG/2ML IJ SOLN
25.0000 ug | INTRAMUSCULAR | Status: DC | PRN
  Filled 2017-12-06: qty 1

## 2017-12-06 MED ORDER — LIDOCAINE 2% (20 MG/ML) 5 ML SYRINGE
INTRAMUSCULAR | Status: AC
Start: 2017-12-06 — End: 2017-12-06
  Filled 2017-12-06: qty 5

## 2017-12-06 MED ORDER — CEFAZOLIN SODIUM-DEXTROSE 1-4 GM/50ML-% IV SOLN
INTRAVENOUS | Status: AC
  Filled 2017-12-06: qty 50

## 2017-12-06 MED ORDER — PROPOFOL 10 MG/ML IV BOLUS
INTRAVENOUS | Status: DC | PRN
  Administered 2017-12-06: 150 mg via INTRAVENOUS

## 2017-12-06 MED ORDER — ACETAMINOPHEN 500 MG PO TABS
1000.0000 mg | ORAL_TABLET | Freq: Once | ORAL | Status: AC
  Administered 2017-12-06: 1000 mg via ORAL
  Filled 2017-12-06: qty 2

## 2017-12-06 MED ORDER — ONDANSETRON HCL 4 MG/2ML IJ SOLN
INTRAMUSCULAR | Status: DC | PRN
  Administered 2017-12-06: 4 mg via INTRAVENOUS

## 2017-12-06 MED ORDER — HYDROCODONE-ACETAMINOPHEN 5-325 MG PO TABS: 1.0000 | ORAL_TABLET | ORAL | 0 refills | Status: DC | PRN

## 2017-12-06 MED ORDER — SODIUM CHLORIDE 0.9 % IV SOLN
INTRAVENOUS | Status: DC
Start: 2017-12-06 — End: 2017-12-07
  Administered 2017-12-06: via INTRAVENOUS
  Filled 2017-12-06: qty 1000

## 2017-12-06 MED ORDER — HYDROCODONE-ACETAMINOPHEN 5-325 MG PO TABS
ORAL_TABLET | ORAL | Status: AC
  Filled 2017-12-06: qty 1

## 2017-12-06 MED ORDER — HYDROCODONE-ACETAMINOPHEN 5-325 MG PO TABS
ORAL_TABLET | ORAL | Status: AC
  Filled 2017-12-06: qty 2

## 2017-12-06 MED ORDER — CEFAZOLIN SODIUM-DEXTROSE 2-4 GM/100ML-% IV SOLN
2.0000 g | Freq: Once | INTRAVENOUS | Status: AC
  Administered 2017-12-06: 2 g via INTRAVENOUS
  Filled 2017-12-06: qty 100

## 2017-12-06 MED ORDER — ONDANSETRON HCL 4 MG/2ML IJ SOLN
4.0000 mg | Freq: Once | INTRAMUSCULAR | Status: DC | PRN
  Filled 2017-12-06: qty 2

## 2017-12-06 MED ORDER — DEXAMETHASONE SODIUM PHOSPHATE 4 MG/ML IJ SOLN
INTRAMUSCULAR | Status: DC | PRN
  Administered 2017-12-06: 10 mg via INTRAVENOUS

## 2017-12-06 MED ORDER — BELLADONNA ALKALOIDS-OPIUM 16.2-60 MG RE SUPP
1.0000 | Freq: Four times a day (QID) | RECTAL | Status: DC | PRN
  Filled 2017-12-06: qty 1

## 2017-12-06 MED ORDER — SODIUM CHLORIDE 0.9 % IR SOLN
Status: DC | PRN
  Administered 2017-12-06 (×5): 3000 mL via INTRAVESICAL

## 2017-12-06 MED ORDER — ACETAMINOPHEN 325 MG PO TABS
650.0000 mg | ORAL_TABLET | ORAL | Status: DC | PRN
  Filled 2017-12-06: qty 2

## 2017-12-06 MED ORDER — LACTATED RINGERS IV SOLN
INTRAVENOUS | Status: DC
  Administered 2017-12-06 (×3): via INTRAVENOUS
  Filled 2017-12-06: qty 1000

## 2017-12-06 MED ORDER — ACETAMINOPHEN 500 MG PO TABS
ORAL_TABLET | ORAL | Status: AC
  Filled 2017-12-06: qty 2

## 2017-12-06 SURGICAL SUPPLY — 17 items
BAG DRAIN URO-CYSTO SKYTR STRL (DRAIN) ×3 IMPLANT
BAG DRN UROCATH (DRAIN) ×1
BAG URINE DRAINAGE (UROLOGICAL SUPPLIES) ×3 IMPLANT
CATH FOLEY 3WAY 30CC 24FR (CATHETERS)
CATH HEMA 3WAY 30CC 24FR COUDE (CATHETERS) ×2 IMPLANT
CATH URTH STD 24FR FL 3W 2 (CATHETERS) IMPLANT
GLOVE BIOGEL M STRL SZ7.5 (GLOVE) ×3 IMPLANT
GOWN STRL REUS W/TWL XL LVL3 (GOWN DISPOSABLE) ×3 IMPLANT
HOLDER FOLEY CATH W/STRAP (MISCELLANEOUS) ×2 IMPLANT
LOOP CUT BIPOLAR 24F LRG (ELECTROSURGICAL) IMPLANT
MANIFOLD NEPTUNE II (INSTRUMENTS) ×3 IMPLANT
PACK CYSTO (CUSTOM PROCEDURE TRAY) ×3 IMPLANT
PIN SAFETY STERILE (MISCELLANEOUS) ×1 IMPLANT
RUBBERBAND STERILE (MISCELLANEOUS) IMPLANT
SYRINGE IRR TOOMEY STRL 70CC (SYRINGE) ×3 IMPLANT
TUBE CONNECTING 12'X1/4 (SUCTIONS) ×1
TUBE CONNECTING 12X1/4 (SUCTIONS) ×2 IMPLANT

## 2017-12-06 NOTE — Anesthesia Preprocedure Evaluation (Signed)
Anesthesia Evaluation  Patient identified by MRN, date of birth, ID band Patient awake    Reviewed: Allergy & Precautions, NPO status , Patient's Chart, lab work & pertinent test results, reviewed documented beta blocker date and time   Airway Mallampati: II  TM Distance: >3 FB Neck ROM: Full    Dental  (+) Teeth Intact, Dental Advisory Given   Pulmonary former smoker,    Pulmonary exam normal breath sounds clear to auscultation       Cardiovascular hypertension, Pt. on home beta blockers + CAD  Normal cardiovascular exam+ dysrhythmias (LBBB)  Rhythm:Regular Rate:Normal  TTE (12/24/15): Normal LV size with mild focal basal hypertrophy of the septum. LVEF 55-60% with normal wall motion. Grade 1 diastolic dysfunction. Trivial aortic regurgitation. Mild mitral regurgitation. Moderate left atrial enlargement. Normal RV size and function.   Neuro/Psych  Neuromuscular disease    GI/Hepatic hiatal hernia, GERD  ,(+)     substance abuse  alcohol use,   Endo/Other  Hypothyroidism   Renal/GU negative Renal ROS   BPH, IC    Musculoskeletal  (+) Arthritis ,   Abdominal   Peds  Hematology negative hematology ROS (+)   Anesthesia Other Findings Day of surgery medications reviewed with the patient.  Reproductive/Obstetrics                             Anesthesia Physical Anesthesia Plan  ASA: III  Anesthesia Plan: General   Post-op Pain Management:    Induction: Intravenous  PONV Risk Score and Plan: 3 and Dexamethasone and Ondansetron  Airway Management Planned: LMA  Additional Equipment:   Intra-op Plan:   Post-operative Plan: Extubation in OR  Informed Consent: I have reviewed the patients History and Physical, chart, labs and discussed the procedure including the risks, benefits and alternatives for the proposed anesthesia with the patient or authorized representative who has indicated  his/her understanding and acceptance.   Dental advisory given  Plan Discussed with: CRNA  Anesthesia Plan Comments: (Risks/benefits of general anesthesia discussed with patient including risk of damage to teeth, lips, gum, and tongue, nausea/vomiting, allergic reactions to medications, and the possibility of heart attack, stroke and death.  All patient questions answered.  Patient wishes to proceed.)        Anesthesia Quick Evaluation

## 2017-12-06 NOTE — Progress Notes (Signed)
Received into RCC #1.  Pt transferred from stretcher to bed w/o assistance. cbi continued.  Foley to straight drain.  Vss.  Pt alert and oriented x4. Oriented to unit.  Guidelines reviewed w pt/wife.  Call bell w/in reach. Given snack to eat. Menu provided. Wife returned phone and glasses to pt.

## 2017-12-06 NOTE — Transfer of Care (Signed)
Immediate Anesthesia Transfer of Care Note  Patient: David Gutierrez  Procedure(s) Performed: Procedure(s) (LRB): TRANSURETHRAL RESECTION OF THE PROSTATE (TURP)/ BIPOLAR (N/A)  Patient Location: PACU  Anesthesia Type: General  Level of Consciousness: awake, oriented, sedated and patient cooperative  Airway & Oxygen Therapy: Patient Spontanous Breathing and Patient connected to face mask oxygen  Post-op Assessment: Report given to PACU RN and Post -op Vital signs reviewed and stable  Post vital signs: Reviewed and stable  Complications: No apparent anesthesia complications  Last Vitals:  Vitals:   12/06/17 0934 12/06/17 0935  BP: (!) 150/86 (!) (P) 150/86  Pulse: 68   Resp: 12   Temp:    SpO2: 92%     Last Pain:  Vitals:   12/06/17 0634  TempSrc: Oral

## 2017-12-06 NOTE — Anesthesia Postprocedure Evaluation (Signed)
Anesthesia Post Note  Patient: ADYNN CASERES  Procedure(s) Performed: TRANSURETHRAL RESECTION OF THE PROSTATE (TURP)/ BIPOLAR (N/A )     Patient location during evaluation: PACU Anesthesia Type: General Level of consciousness: awake and alert Pain management: pain level controlled Vital Signs Assessment: post-procedure vital signs reviewed and stable Respiratory status: spontaneous breathing, nonlabored ventilation and respiratory function stable Cardiovascular status: blood pressure returned to baseline and stable Postop Assessment: no apparent nausea or vomiting Anesthetic complications: no    Last Vitals:  Vitals:   12/06/17 0934 12/06/17 0945  BP: (!) 150/86 125/63  Pulse: 68 65  Resp: 12 12  Temp:    SpO2: 92% 98%    Last Pain:  Vitals:   12/06/17 0634  TempSrc: Oral                 Catalina Gravel

## 2017-12-06 NOTE — Interval H&P Note (Signed)
History and Physical Interval Note:  12/06/2017 8:27 AM  David Gutierrez  has presented today for surgery, with the diagnosis of BENIGN PROSTATIC HYPERPLASIA  The various methods of treatment have been discussed with the patient and family. After consideration of risks, benefits and other options for treatment, the patient has consented to  Procedure(s): TRANSURETHRAL RESECTION OF THE PROSTATE (TURP)/ BIPOLAR (N/A) as a surgical intervention .  The patient's history has been reviewed, patient examined, no change in status, stable for surgery.  I have reviewed the patient's chart and labs.  Questions were answered to the patient's satisfaction.     Conception Oms Winter

## 2017-12-06 NOTE — Anesthesia Procedure Notes (Signed)
Procedure Name: LMA Insertion Date/Time: 12/06/2017 8:37 AM Performed by: Justice Rocher, CRNA Pre-anesthesia Checklist: Patient identified, Emergency Drugs available, Suction available and Patient being monitored Patient Re-evaluated:Patient Re-evaluated prior to induction Oxygen Delivery Method: Circle system utilized Preoxygenation: Pre-oxygenation with 100% oxygen Induction Type: IV induction Ventilation: Mask ventilation without difficulty LMA: LMA inserted LMA Size: 5.0 Number of attempts: 1 Airway Equipment and Method: Bite block Placement Confirmation: positive ETCO2 and breath sounds checked- equal and bilateral Tube secured with: Tape Dental Injury: Teeth and Oropharynx as per pre-operative assessment

## 2017-12-06 NOTE — Discharge Instructions (Signed)
1.  Foley Foley catheter at 6 AM on 12/11/17 2.  No driving on pain meds 3.  Ok to shower 4.  No lifting >10 lbs for one week

## 2017-12-06 NOTE — Op Note (Signed)
Operative Note  Preoperative diagnosis:  1.  BPH with incomplete bladder emptying  Postoperative diagnosis: 1.  BPH with incomplete bladder emptying  Procedure(s): 1.  Bipolar TURP  Surgeon: Ellison Hughs, MD  Assistants:  None  Anesthesia:  Gen endotracheal  Complications:  None  EBL:  <5 mL  Specimens: 1. Prostate chips  Drains/Catheters: 1.  24 French 3 way Foley with 30 mL in the balloon  Intraoperative findings:  Bilobar prostatic obstruction  Indication:  David Gutierrez is a 84 y.o. male with a long history of BPH with incomplete bladder emptying and recurrent UTIs that has required more frequent CIC as well as anti-biotic suppression.  He had a cystoscopy in the office and UDS that showed bilobar prostatic obstruction with good bladder sensation and appropriate detrusor contractility, respectively.  He has been consented for the above procedures, voices understanding and wishes to proceed.  Description of procedure:  After informed consent was obtained, the patient was brought to the operating room and general LMA anesthesia was administered. The patient was then placed in the dorsolithotomy position and prepped and draped in usual sterile fashion. A timeout was performed. A 21 French rigid cystoscope was then inserted into the urethral meatus and advanced into the bladder under direct vision. A complete bladder survey revealed no intravesical pathology.  The rigid cystoscope was then exchanged for a 26 French resectoscope with a bipolar loop working element. Inspection of the prostatic urethra revealed bilobar prostatic obstruction with a prominent anterior lobe. The lateral and anterior lobes of the prostate were then systematically resected starting at the bladder neck and progressing no more distal than the verumontanum. All prostate chips were then irrigated from the bladder through the sheath of the cystoscope. The resection bed within the prostatic urethra was  then extensively fulgurated until hemostasis was achieved. The resectoscope was then removed under direct vision once all prostate chips were removed. A 24 French three-way Foley catheter was inserted and 30 mL was placed in the catheter balloon. The Foley catheter was then placed to continuous bladder irrigation. The patient tolerated the procedure well and was transferred to the postanesthesia unit in stable condition.  Plan:  Continuous bladder irrigation overnight. Home with Foley catheter. Plan for voiding trial at 6 AM on 12/11/2017 and follow-up in the office in 2 weeks.

## 2017-12-07 ENCOUNTER — Encounter (HOSPITAL_BASED_OUTPATIENT_CLINIC_OR_DEPARTMENT_OTHER): Payer: Self-pay | Admitting: Urology

## 2017-12-07 DIAGNOSIS — N401 Enlarged prostate with lower urinary tract symptoms: Secondary | ICD-10-CM | POA: Diagnosis not present

## 2017-12-07 LAB — BASIC METABOLIC PANEL
ANION GAP: 5 (ref 5–15)
BUN: 10 mg/dL (ref 6–20)
CALCIUM: 8.2 mg/dL — AB (ref 8.9–10.3)
CO2: 25 mmol/L (ref 22–32)
CREATININE: 0.4 mg/dL — AB (ref 0.61–1.24)
Chloride: 109 mmol/L (ref 101–111)
GFR calc Af Amer: >60 mL/min (ref >60)
GFR calc non Af Amer: >60 mL/min (ref >60)
GLUCOSE: 121 mg/dL — AB (ref 65–99)
Potassium: 3.5 mmol/L (ref 3.5–5.1)
Sodium: 139 mmol/L (ref 135–145)

## 2017-12-07 LAB — HEMOGLOBIN AND HEMATOCRIT, BLOOD
HCT: 34.8 % — ABNORMAL LOW (ref 39.0–52.0)
Hemoglobin: 11.6 g/dL — ABNORMAL LOW (ref 13.0–17.0)

## 2017-12-07 NOTE — Discharge Summary (Signed)
Date of admission: 12/06/2017  Date of discharge: 12/07/2017  Admission diagnosis: BPH with incomplete emptying  Discharge diagnosis: Same   History and Physical: For full details, please see admission history and physical. Briefly, David Gutierrez is a 84 y.o. year old patient with a long history of BPH with incomplete emptying 2/2 outlet obstruction.    Hospital Course: Following his TURP on 12/06/17, the patient was monitored on the floor with no acute events.  He was discharged home with his Foley catheter on 12/07/17.  Laboratory values:  Recent Labs    12/06/17 0723 12/07/17 0600  HGB 13.3 11.6*  HCT 39.0 34.8*   Recent Labs    12/07/17 0600  CREATININE 0.40*    Disposition: Home  Discharge instruction: The patient was instructed to be ambulatory but told to refrain from heavy lifting, strenuous activity, or driving.  Discharge medications:  Allergies as of 12/07/2017      Reactions   Cyclobenzaprine Other (See Comments)   Lost balance   Lisinopril Other (See Comments)   Affected ability to urinate   Relafen [nabumetone] Other (See Comments)   Problems urinating afterwards    Statins Other (See Comments)   "my peeing"      Medication List    TAKE these medications   aspirin EC 81 MG tablet Take 81 mg by mouth daily.   b complex vitamins capsule Take 1 capsule by mouth daily.   bethanechol 25 MG tablet Commonly known as:  URECHOLINE Take 25 mg by mouth 2 (two) times daily.   carvedilol 6.25 MG tablet Commonly known as:  COREG Take 1 tablet (6.25 mg total) by mouth 2 (two) times daily.   COLON CLEANSER PO Take 3 capsules by mouth 2 (two) times daily.   diazepam 5 MG tablet Commonly known as:  VALIUM Take 2.5 mg by mouth 2 (two) times daily.   diphenhydramine-acetaminophen 25-500 MG Tabs tablet Commonly known as:  TYLENOL PM Take 1 tablet by mouth at bedtime.   HYDROcodone-acetaminophen 5-325 MG tablet Commonly known as:  NORCO Take 1 tablet by mouth  every 4 (four) hours as needed for moderate pain.   LEVOTHROID 25 MCG tablet Generic drug:  levothyroxine Take 25 mcg by mouth every evening.   phenazopyridine 200 MG tablet Commonly known as:  PYRIDIUM Take 1 tablet (200 mg total) by mouth 3 (three) times daily as needed for pain.   rosuvastatin 5 MG tablet Commonly known as:  CRESTOR TAKE ONE TABLET BY MOUTH ONCE DAILY What changed:    how much to take  how to take this  when to take this   sulfamethoxazole-trimethoprim 800-160 MG tablet Commonly known as:  BACTRIM DS,SEPTRA DS Take 1 tablet by mouth 2 (two) times daily for 3 days.   trimethoprim 100 MG tablet Commonly known as:  TRIMPEX Take 100 mg by mouth every evening.       Followup:  Follow-up Information    Ceasar Mons, MD In 2 weeks.   Specialty:  Urology Contact information: [address] [place] [address] [phone]

## 2017-12-14 ENCOUNTER — Ambulatory Visit: Payer: PPO | Admitting: Internal Medicine

## 2017-12-14 ENCOUNTER — Encounter: Payer: Self-pay | Admitting: Internal Medicine

## 2017-12-14 VITALS — BP 118/68 | HR 73 | Ht 72.0 in | Wt 205.4 lb

## 2017-12-14 DIAGNOSIS — E785 Hyperlipidemia, unspecified: Secondary | ICD-10-CM

## 2017-12-14 DIAGNOSIS — I428 Other cardiomyopathies: Secondary | ICD-10-CM | POA: Diagnosis not present

## 2017-12-14 DIAGNOSIS — I447 Left bundle-branch block, unspecified: Secondary | ICD-10-CM | POA: Diagnosis not present

## 2017-12-14 DIAGNOSIS — I2510 Atherosclerotic heart disease of native coronary artery without angina pectoris: Secondary | ICD-10-CM | POA: Diagnosis not present

## 2017-12-14 MED ORDER — ROSUVASTATIN CALCIUM 5 MG PO TABS: 5.0000 mg | ORAL_TABLET | Freq: Every day | ORAL | 3 refills | Status: DC

## 2017-12-14 NOTE — Progress Notes (Signed)
Follow-up Outpatient Visit Date: 12/14/2017  Primary Care Provider: Leanna Battles, MD [place] [address]  Chief Complaint: Follow-up cardiomyopathy  HPI:  Mr. Ditommaso is a 84 y.o. year-old male with history of nonischemic cardiomyopathy with chronic LBBB and nonobstructive CAD, who presents for follow-up of cardiomyopathy. I last saw Mr. Rusnak in 05/2017, at which time he reported doing well with the exception of long-standing neuropathy in both legs. This had improved a little bit after alcohol cessation.  Today, Mr. Higginbotham reports rate feeling well from a heart standpoint.  He is recovering from transurethral resection of the prostate earlier this month.  He still has some urinary retention and intermittent hematuria.  He reports one episode of epigastric discomfort that occurred in the setting of urinary retention.  He denies chest pain, shortness of breath, palpitations, and lightheadedness.  He is tolerating his medications well.  He recently held rosuvastatin, thinking that it may have been making it more difficult for him to urinate.  In light of his prostate issues, he now plans to restart rosuvastatin.  Lower extremity neuropathy is not significantly changed.  Mr. Amborn remains abstinent from alcohol.  --------------------------------------------------------------------------------------------------  Cardiovascular History & Procedures: Cardiovascular Problems:  Nonischemic cardiomyopathy  Nonobstructive coronary artery disease  Risk Factors:  Known coronary artery disease, male gender, and age greater than 7  Cath/PCI:  LHC (11/07/12): LMCA normal. LAD with 30% proximal stenosis. D1 with 40% ostial disease. LCx with 30% ostial stenosis and diffuse 40-50% disease involving the distal vessel. Moderate-caliber ramus with luminal irregularities. Dominant RCA with 40% mid vessel stenosis. LVEF 50%.  CV Surgery:  None  EP Procedures and  Devices:  None  Non-Invasive Evaluation(s):  TTE (12/24/15): Normal LV size with mild focal basal hypertrophy of the septum. LVEF 55-60% with normal wall motion. Grade 1 diastolic dysfunction. Trivial aortic regurgitation. Mild mitral regurgitation. Moderate left atrial enlargement. Normal RV size and function.  Pharmacologic MPI (10/16/12): Fixed inferior and inferoseptal defect, which may reflect scar or artifact from left bundle-branch block. LVEF 38%. No ischemia.  Recent CV Pertinent Labs: Lab Results  Component Value Date   CHOL 145 03/24/2016   HDL 46 03/24/2016   LDLCALC 81 03/24/2016   TRIG 92 03/24/2016   CHOLHDL 3.2 03/24/2016   INR 1.09 03/01/2017   K 3.5 12/07/2017   MG 1.8 03/03/2017   BUN 10 12/07/2017   CREATININE 0.40 (L) 12/07/2017    Past medical and surgical history were reviewed and updated in EPIC.  Current Meds  Medication Sig  . aspirin EC 81 MG tablet Take 81 mg by mouth daily.  Marland Kitchen b complex vitamins capsule Take 1 capsule by mouth daily.  . bethanechol (URECHOLINE) 25 MG tablet Take 25 mg by mouth 2 (two) times daily.  . carvedilol (COREG) 6.25 MG tablet Take 1 tablet (6.25 mg total) by mouth 2 (two) times daily. (Patient taking differently: Take 6.25 mg by mouth 2 (two) times daily. )  . diazepam (VALIUM) 5 MG tablet Take 2.5 mg by mouth 2 (two) times daily.   . diphenhydramine-acetaminophen (TYLENOL PM) 25-500 MG TABS tablet Take 1 tablet by mouth at bedtime.   Marland Kitchen HYDROcodone-acetaminophen (NORCO) 5-325 MG tablet Take 1 tablet by mouth every 4 (four) hours as needed for moderate pain.  Marland Kitchen levothyroxine (LEVOTHROID) 25 MCG tablet Take 25 mcg by mouth every evening.   . Misc Natural Products (COLON CLEANSER PO) Take 3 capsules by mouth 2 (two) times daily.    Marland Kitchen  omeprazole (PRILOSEC) 20 MG capsule Take 20 mg by mouth daily.  . phenazopyridine (PYRIDIUM) 200 MG tablet Take 1 tablet (200 mg total) by mouth 3 (three) times daily as needed for pain.  Marland Kitchen  trimethoprim (TRIMPEX) 100 MG tablet Take 100 mg by mouth every evening.    Allergies: Cyclobenzaprine; Lisinopril; Relafen [nabumetone]; and Statins  Social History   Socioeconomic History  . Marital status: Married    Spouse name: Not on file  . Number of children: 3  . Years of education: Not on file  . Highest education level: Not on file  Social Needs  . Financial resource strain: Not on file  . Food insecurity - worry: Not on file  . Food insecurity - inability: Not on file  . Transportation needs - medical: Not on file  . Transportation needs - non-medical: Not on file  Occupational History  . Occupation: retired  Tobacco Use  . Smoking status: Former Smoker    Packs/day: 2.00    Years: 15.00    Pack years: 30.00    Types: Cigarettes    Last attempt to quit: 11/30/1967    Years since quitting: 50.0  . Smokeless tobacco: Never Used  Substance and Sexual Activity  . Alcohol use: Yes    Comment: per pt quit drinking dec 2017 ( hx2-3 drinks per day ,vodka)  . Drug use: No  . Sexual activity: Not on file  Other Topics Concern  . Not on file  Social History Narrative   3 caffinated drinks per day.     Lives with wife at [place].  Has 3 children.     Retired from [employer].  Education: BA from [employer].     Family History  Problem Relation Age of Onset  . Thyroid cancer Brother   . Healthy Mother   . Healthy Father   . Other Sister   . Heart disease Brother   . Healthy Sister   . Healthy Sister   . Diabetes type I Son   . Colon cancer Neg Hx     Review of Systems: A 12-system review of systems was performed and was negative except as noted in the HPI.  --------------------------------------------------------------------------------------------------  Physical Exam: BP 118/68   Pulse 73   Ht 6' (1.829 m)   Wt 205 lb 6.4 oz (93.2 kg)   SpO2 95%   BMI 27.86 kg/m   General: Overweight man, seated comfortably in the exam room. HEENT: No  conjunctival pallor or scleral icterus. Moist mucous membranes.  OP clear. Neck: Supple without lymphadenopathy, thyromegaly, JVD, or HJR. No carotid bruit. Lungs: Normal work of breathing. Clear to auscultation bilaterally without wheezes or crackles. Heart: Regular rate and rhythm without murmurs, rubs, or gallops. Non-displaced PMI. Abd: Bowel sounds present. Soft with mild lower abdominal tenderness.  No rebound or guarding.  No HSM. Ext: No lower extremity edema. Radial, PT, and DP pulses are 2+ bilaterally. Skin: Warm and dry without rash.  EKG: Normal sinus rhythm with first-degree AV block (PR interval 290 ms) and left bundle branch block.  PACs are no longer present.  PR interval has lengthened since 05/2017.  Lab Results  Component Value Date   WBC 5.6 03/27/2017   HGB 11.6 (L) 12/07/2017   HCT 34.8 (L) 12/07/2017   MCV 95.7 03/27/2017   PLT 122.0 (L) 03/27/2017    Lab Results  Component Value Date   NA 139 12/07/2017   K 3.5 12/07/2017   CL 109 12/07/2017  CO2 25 12/07/2017   BUN 10 12/07/2017   CREATININE 0.40 (L) 12/07/2017   GLUCOSE 121 (H) 12/07/2017   ALT 11 (L) 03/03/2017    Lab Results  Component Value Date   CHOL 145 03/24/2016   HDL 46 03/24/2016   LDLCALC 81 03/24/2016   TRIG 92 03/24/2016   CHOLHDL 3.2 03/24/2016    --------------------------------------------------------------------------------------------------  ASSESSMENT AND PLAN: Nonischemic cardiomyopathy with chronic left bundle branch block Mr. Nunley appears euvolemic and well compensated with NYHA class II symptoms.  We will continue his current medications, including carvedilol.  However, we will need to follow his PR interval.  If it lengthens further or there is development of high degree AV block, we will need to discontinue carvedilol and consider further EP evaluation.  Coronary artery disease without angina Mild to moderate nonobstructive CAD noted in 2013 by cath.  Continue current  medications for primary prevention.  Hyperlipidemia I encouraged Mr. Vanaman to restart rosuvastatin, as it is unlikely that it was contributing to his urinary retention.  I will defer lipid testing to Dr. Philip Aspen.  Follow-up: Return to clinic in 1 year.  Nelva Bush, MD 12/15/2017 12:48 PM

## 2017-12-14 NOTE — Patient Instructions (Addendum)
  Medication Instructions:  Your physician has recommended you make the following change in your medication:   1)  Restart Crestor 5 mg by mouth daily.  -- If you need a refill on your cardiac medications before your next appointment, please call your pharmacy. --  Labwork: None ordered  Testing/Procedures: None ordered  Follow-Up: Your physician wants you to follow-up in: 1 year with Dr. Saunders Revel.  You will receive a reminder letter in the mail two months in advance. If you don't receive a letter, please call our office to schedule the follow-up appointment.  Thank you for choosing [facility]!!    Any Other Special Instructions Will Be Listed Below (If Applicable).

## 2017-12-15 ENCOUNTER — Encounter: Payer: Self-pay | Admitting: Internal Medicine

## 2018-01-01 DIAGNOSIS — N401 Enlarged prostate with lower urinary tract symptoms: Secondary | ICD-10-CM | POA: Diagnosis not present

## 2018-01-01 DIAGNOSIS — R3914 Feeling of incomplete bladder emptying: Secondary | ICD-10-CM | POA: Diagnosis not present

## 2018-01-04 DIAGNOSIS — R1084 Generalized abdominal pain: Secondary | ICD-10-CM | POA: Diagnosis not present

## 2018-01-04 DIAGNOSIS — Z1389 Encounter for screening for other disorder: Secondary | ICD-10-CM | POA: Diagnosis not present

## 2018-01-04 DIAGNOSIS — G6289 Other specified polyneuropathies: Secondary | ICD-10-CM | POA: Diagnosis not present

## 2018-01-04 DIAGNOSIS — R5383 Other fatigue: Secondary | ICD-10-CM | POA: Diagnosis not present

## 2018-01-04 DIAGNOSIS — Z6828 Body mass index (BMI) 28.0-28.9, adult: Secondary | ICD-10-CM | POA: Diagnosis not present

## 2018-01-04 DIAGNOSIS — I10 Essential (primary) hypertension: Secondary | ICD-10-CM | POA: Diagnosis not present

## 2018-01-08 ENCOUNTER — Other Ambulatory Visit: Payer: Self-pay | Admitting: Internal Medicine

## 2018-01-08 DIAGNOSIS — R109 Unspecified abdominal pain: Secondary | ICD-10-CM

## 2018-01-12 ENCOUNTER — Other Ambulatory Visit: Payer: Self-pay | Admitting: Internal Medicine

## 2018-01-12 DIAGNOSIS — K2100 Gastro-esophageal reflux disease with esophagitis, without bleeding: Secondary | ICD-10-CM

## 2018-01-12 DIAGNOSIS — R1310 Dysphagia, unspecified: Secondary | ICD-10-CM

## 2018-01-17 ENCOUNTER — Ambulatory Visit
Admission: RE | Admit: 2018-01-17 | Discharge: 2018-01-17 | Disposition: A | Discharge status: Home / Self Care | Payer: PPO | Source: Ambulatory Visit | Attending: Internal Medicine | Admitting: Internal Medicine

## 2018-01-17 DIAGNOSIS — R109 Unspecified abdominal pain: Secondary | ICD-10-CM

## 2018-01-17 DIAGNOSIS — K7689 Other specified diseases of liver: Secondary | ICD-10-CM | POA: Diagnosis not present

## 2018-01-24 ENCOUNTER — Other Ambulatory Visit: Payer: Self-pay | Admitting: Internal Medicine

## 2018-01-24 DIAGNOSIS — K862 Cyst of pancreas: Secondary | ICD-10-CM

## 2018-02-05 ENCOUNTER — Ambulatory Visit
Admission: RE | Admit: 2018-02-05 | Discharge: 2018-02-05 | Disposition: A | Discharge status: Home / Self Care | Payer: PPO | Source: Ambulatory Visit | Attending: Internal Medicine | Admitting: Internal Medicine

## 2018-02-05 DIAGNOSIS — K862 Cyst of pancreas: Secondary | ICD-10-CM

## 2018-02-05 MED ORDER — IOPAMIDOL (ISOVUE-300) INJECTION 61%
125.0000 mL | Freq: Once | INTRAVENOUS | Status: AC | PRN
  Administered 2018-02-05: 125 mL via INTRAVENOUS

## 2018-02-09 ENCOUNTER — Telehealth: Payer: Self-pay | Admitting: Internal Medicine

## 2018-02-09 NOTE — Telephone Encounter (Signed)
Patient states after a procedure he had he has been experiencing abd pain and trouble with BMs. Patient states after speaking personally with Lyla Son he told him he needed to see what Dr.Perry thinks. Pt requesting advice.

## 2018-02-09 NOTE — Telephone Encounter (Signed)
Pt states he had a TURP done the first part of January and since then he has been having issues with abdominal pain and changes with his bowels. Pt requests to be seen. Pt scheduled to see Ellouise Newer PA 02/12/18@3 :15pm. Pt aware of appt.

## 2018-02-12 ENCOUNTER — Ambulatory Visit: Payer: PPO | Admitting: Physician Assistant

## 2018-02-12 ENCOUNTER — Encounter: Payer: Self-pay | Admitting: Physician Assistant

## 2018-02-12 ENCOUNTER — Other Ambulatory Visit: Payer: PPO

## 2018-02-12 VITALS — BP 110/60 | HR 80 | Ht 72.0 in | Wt 204.0 lb

## 2018-02-12 DIAGNOSIS — R194 Change in bowel habit: Secondary | ICD-10-CM

## 2018-02-12 DIAGNOSIS — Z86010 Personal history of colonic polyps: Secondary | ICD-10-CM

## 2018-02-12 DIAGNOSIS — R9389 Abnormal findings on diagnostic imaging of other specified body structures: Secondary | ICD-10-CM | POA: Diagnosis not present

## 2018-02-12 MED ORDER — NA SULFATE-K SULFATE-MG SULF 17.5-3.13-1.6 GM/177ML PO SOLN: 1.0000 | ORAL | 0 refills | Status: DC

## 2018-02-12 NOTE — Patient Instructions (Signed)
Your physician has requested that you go to the basement for lab work before leaving today.  You have been scheduled for a colonoscopy. Please follow written instructions given to you at your visit today.  Please pick up your prep supplies at the pharmacy within the next 1-3 days. If you use inhalers (even only as needed), please bring them with you on the day of your procedure. Your physician has requested that you go to www.[email or web address] and enter the access code given to you at your visit today. This web site gives a general overview about your procedure. However, you should still follow specific instructions given to you by our office regarding your preparation for the procedure.   

## 2018-02-12 NOTE — Progress Notes (Signed)
Chief Complaint: Change in bowel habits, abdominal pain, abnormal CT pancreas  HPI:    David Gutierrez is an 84 year old Caucasian male, who follows with Dr. Henrene Pastor, with a past medical history as listed below, including CAD (last EF 12/14/15 55-60%), who was referred to me by Leanna Battles, MD for a complaint of angina bowel habits and abdominal pain.      Colonoscopy 03/01/17 with 6 adenomatous polyps, repeat recommended in 1 year.  Patient did have a post polypectomy bleed and was hospitalized after this last colonoscopy.    Per review of chart recently underwent TURP 12/07/17.  Also recent CT abdomen pelvis 02/05/18 showing 3.6 cm unilocular cystic lesion in the pancreatic head/get 8 process, favoring a benign pseudocyst.    Today, explains that after TURP started with a change in bowel habits, now having multiple stools a day and often these are "mushy"".  Was on ciprofloxacin around the time of TURP.  Currently using docusate sodium plus Senokot on a daily basis for bowel movements.  Describes history of chronic constipation and use of MiraLAX and/or this medication in the past.  Afraid to hold this in case "I do not have a bowel movement at all".  Describes associated abdominal pain which is better after he has a bowel movement.  Also describes having to self catheterize every 2-4 days after TURP to fully void himself.  When his bladder and colon are empty he no longer has abdominal pain.    Also describes recent CT pancreas showing 3.6 cm unilocular cystic lesion in the pancreatic head/kidney process, favoring a benign pseudocyst for which his primary care provider is elected to follow this every 6 months with imaging.  Patient has sought advice from his "doctor friends" who recommend testing for pancreatic insufficiency related to this.    Denies fever, chills, weight loss, anorexia, nausea, vomiting, blood in his stool, melena or symptoms that awaken him at night.  Past Medical History:  Diagnosis Date   . Alcoholic peripheral neuropathy [employer])    neurologist-  dr patel--- feet numbness and sensory ataxia  . Arthritis   . B12 deficiency   . BPH (benign prostatic hyperplasia)   . CAD (coronary artery disease) cardiolgoist-  dr Harrell Gave end (previouly dr dalton [name])   a. Myoview 11/13:  EF 38%, inf and IS defect c/w scar but no ischemia:  b. Cardiac CT 11/13:  Ca score 318 Agatson units, pLAD and dCFX plaque;   c. LHC 11/07/12:  pLAD 30%, oD1 40%, oCFX 30%, dCFX 40-50%, mRCA 40%, EF 50% (frequent PVCs and short run of NSVT with injection/    per last echo 01/ 2017  ef 55-60%  . Chronic constipation   . Diverticulosis of colon   . Esophageal reflux   . External hemorrhoids   . First degree heart block   . Hiatal hernia   . History of adenomatous polyp of colon   . History of esophageal stricture 08/11/2015   s/p  dilatation  . History of lower GI bleeding 03/01/2017   s/p  flexiable simoidscopy w/ clipping rectum ulcer  . Hypogonadism male   . Hypothyroidism   . Interstitial cystitis   . Irritable bowel syndrome   . LBBB (left bundle branch block)   . NICM (nonischemic cardiomyopathy) [facility]) cardiologist-  dr Harrell Gave end (previously dr dalton [name])--  per last echo 01/ 2017  ef 55-60%   ? 2/2 LBBB => a. echo 11/13: diff HK, worse in septum and apex, mod  LVE, mild LVH, EF 40%, mild AI, mild MR, mod LAE  . Self-catheterizes urinary bladder    bid to tid   . Wears glasses   . Wears hearing aid in both ears     Past Surgical History:  Procedure Laterality Date  . CARDIAC CATHETERIZATION  11-07-2012    dr [name]   nonobstructive CAD (pLAD 30%, ostial D1 40%, ostial LCx 30%, dLCx diffuse 40-50%, mRCA 40%) ;  LVSF 50% but diffiult due to PVCs and short run VT with injection;  cardiomyopathy mostly likely a LBBB cardiomyopathy  . CARDIOVASCULAR STRESS TEST  10-16-2012   dr [name]   Low risk adenosine nuclear study (no exercise) w/ a fixed inferior and inferoseptal defect without  ischemia (question as whether this abnormality due to LBBB cardiomyopathy or due to scar)/  LV ef 38%,  LV wall motion decreased of the septum and entire apex  . CATARACT EXTRACTION W/ INTRAOCULAR LENS  IMPLANT, BILATERAL  2012  approx.  . CYSTO/  HYDRODISTENTION/  BLADDER BIOPSY  04-20-2005    dr Lawerance Bach  [facility]  . ETHMOIDECTOMY Right 12/12/2016   Procedure: RIGHT ENDOSCOPIC ETHMOIDECTOMY;  Surgeon: [name], MD;  Location: [place];  Service: ENT;  Laterality: Right;  . FLEXIBLE SIGMOIDOSCOPY N/A 03/02/2017   Procedure: FLEXIBLE SIGMOIDOSCOPY;  Surgeon: Ladene Artist, MD;  Location: [facility];  Service: Endoscopy;  Laterality: N/A;  . [place]  . MAXILLARY ANTROSTOMY Right 12/12/2016   Procedure: RIGHT ENDOSCOPIC MAXILLARY ANTROSTOMY;  Surgeon: [name], MD;  Location: [place];  Service: ENT;  Laterality: Right;  . SINUS ENDO W/FUSION Right 12/12/2016   Procedure: ENDOSCOPIC SINUS SURGERY WITH NAVIGATION;  Surgeon: [name], MD;  Location: [place];  Service: ENT;  Laterality: Right;  . SPHENOIDECTOMY Right 12/12/2016   Procedure: RIGHT ENDOSCOPIC SPHENOIDECTOMY;  Surgeon: [name], MD;  Location: [place];  Service: ENT;  Laterality: Right;  . TONSILLECTOMY AND ADENOIDECTOMY  child  . TOTAL KNEE ARTHROPLASTY Left 10/14/2014   Procedure: LEFT TOTAL KNEE ARTHROPLASTY;  Surgeon: [name], MD;  Location: [facility];  Service: Orthopedics;  Laterality: Left;  . TOTAL KNEE ARTHROPLASTY Right 1990s  . TRANSTHORACIC ECHOCARDIOGRAM  12-24-2015   dr [name]   ef 55-60%,  grade 1 diastolic dysfunction/  trivial AR and TR  mild MR and PR/  moderate LAE  . TRANSURETHRAL RESECTION OF PROSTATE  06-11-2010   dr Gaynelle Arabian  [facility]   w/  GYRUS  . TRANSURETHRAL RESECTION OF PROSTATE N/A 12/06/2017   Procedure: TRANSURETHRAL RESECTION OF THE PROSTATE (TURP)/ BIPOLAR;  Surgeon: Ceasar Mons, MD;  Location: [facility];  Service: Urology;  Laterality: N/A;    Current Outpatient Medications  Medication Sig Dispense Refill  . aspirin EC 81 MG tablet Take 81 mg by mouth daily.    Marland Kitchen b complex vitamins capsule Take 1 capsule by mouth daily.    . bethanechol (URECHOLINE) 25 MG tablet Take 25 mg by mouth 2 (two) times daily.    . carvedilol (COREG) 6.25 MG tablet Take 1 tablet (6.25 mg total) by mouth 2 (two) times daily. (Patient taking differently: Take 6.25 mg by mouth 2 (two) times daily. ) 180 tablet 3  . diazepam (VALIUM) 5 MG tablet Take 2.5 mg by mouth 2 (two) times daily.     . diphenhydramine-acetaminophen (TYLENOL PM) 25-500 MG TABS tablet Take 1 tablet by mouth at bedtime.     Marland Kitchen  HYDROcodone-acetaminophen (NORCO) 5-325 MG tablet Take 1 tablet by mouth every 4 (four) hours as needed for moderate pain. 20 tablet 0  . levothyroxine (LEVOTHROID) 25 MCG tablet Take 25 mcg by mouth every evening.     . Misc Natural Products (COLON CLEANSER PO) Take 3 capsules by mouth 2 (two) times daily.      Marland Kitchen omeprazole (PRILOSEC) 20 MG capsule Take 20 mg by mouth daily.    Marland Kitchen omeprazole (PRILOSEC) 20 MG capsule TAKE ONE CAPSULE EACH DAY 90 capsule 1  . rosuvastatin (CRESTOR) 5 MG tablet Take 1 tablet (5 mg total) by mouth daily. 90 tablet 3   No current facility-administered medications for this visit.     Allergies as of 02/12/2018 - Review Complete 02/12/2018  Allergen Reaction Noted  . Cyclobenzaprine Other (See Comments) 10/14/2014  . Lisinopril Other (See Comments) 10/14/2014  . Relafen [nabumetone] Other (See Comments) 10/14/2014  . Statins Other (See Comments) 02/15/2017    Family History  Problem Relation Age of Onset  . Thyroid cancer Brother   . Healthy Mother   . Healthy Father   . Other Sister   . Heart disease Brother   . Healthy Sister   . Healthy Sister   . Diabetes type I Son   . Colon cancer Neg Hx     Social History   Socioeconomic History  . Marital status: Married    Spouse  name: Not on file  . Number of children: 3  . Years of education: Not on file  . Highest education level: Not on file  Social Needs  . Financial resource strain: Not on file  . Food insecurity - worry: Not on file  . Food insecurity - inability: Not on file  . Transportation needs - medical: Not on file  . Transportation needs - non-medical: Not on file  Occupational History  . Occupation: retired  Tobacco Use  . Smoking status: Former Smoker    Packs/day: 2.00    Years: 15.00    Pack years: 30.00    Types: Cigarettes    Last attempt to quit: 11/30/1967    Years since quitting: 50.2  . Smokeless tobacco: Never Used  Substance and Sexual Activity  . Alcohol use: Yes    Comment: per pt quit drinking dec 2017 ( hx2-3 drinks per day ,vodka)  . Drug use: No  . Sexual activity: Not on file  Other Topics Concern  . Not on file  Social History Narrative   3 caffinated drinks per day.     Lives with wife at [place].  Has 3 children.     Retired from [employer].  Education: BA from [employer].     Review of Systems:    Constitutional: No weight loss, fever or chills Cardiovascular: No chest pain Respiratory: No SOB  Gastrointestinal: See HPI and otherwise negative   Physical Exam:  Vital signs: BP 110/60   Pulse 80   Ht 6' (1.829 m)   Wt 204 lb (92.5 kg)   BMI 27.67 kg/m   Constitutional:   Pleasant Elderly Caucasian male appears to be in NAD, Well developed, Well nourished, alert and cooperative  Respiratory: Respirations even and unlabored. Lungs clear to auscultation bilaterally.   No wheezes, crackles, or rhonchi.  Cardiovascular: Normal S1, S2. No MRG. Regular rate and rhythm. No peripheral edema, cyanosis or pallor.  Gastrointestinal:  Soft, nondistended, nontender. No rebound or guarding. Normal bowel sounds. No appreciable masses or hepatomegaly. Rectal:  Not performed.  Psychiatric:  Demonstrates good judgement and reason without abnormal affect or  behaviors.  RELEVANT LABS AND IMAGING: CBC    Component Value Date/Time   WBC 5.6 03/27/2017 0956   RBC 3.84 (L) 03/27/2017 0956   HGB 11.6 (L) 12/07/2017 0600   HCT 34.8 (L) 12/07/2017 0600   PLT 122.0 (L) 03/27/2017 0956   MCV 95.7 03/27/2017 0956   MCH 31.6 03/03/2017 0511   MCHC 33.3 03/27/2017 0956   RDW 19.4 (H) 03/27/2017 0956   LYMPHSABS 1.4 03/27/2017 0956   MONOABS 0.6 03/27/2017 0956   EOSABS 0.3 03/27/2017 0956   BASOSABS 0.1 03/27/2017 0956    CMP     Component Value Date/Time   NA 139 12/07/2017 0600   K 3.5 12/07/2017 0600   CL 109 12/07/2017 0600   CO2 25 12/07/2017 0600   GLUCOSE 121 (H) 12/07/2017 0600   BUN 10 12/07/2017 0600   CREATININE 0.40 (L) 12/07/2017 0600   CALCIUM 8.2 (L) 12/07/2017 0600   PROT 5.4 (L) 03/03/2017 0511   ALBUMIN 3.1 (L) 03/03/2017 0511   AST 15 03/03/2017 0511   ALT 11 (L) 03/03/2017 0511   ALKPHOS 55 03/03/2017 0511   BILITOT 1.0 03/03/2017 0511   GFRNONAA >60 12/07/2017 0600   GFRAA >60 12/07/2017 0600   CT ABDOMEN WITHOUT AND WITH CONTRAST 02/05/18  TECHNIQUE: Multidetector CT imaging of the abdomen was performed following the standard protocol before and following the bolus administration of intravenous contrast.  CONTRAST:  135mL ISOVUE-300 IOPAMIDOL (ISOVUE-300) INJECTION 61%  Creatinine was obtained on site at [place] at [address]  Results: Creatinine 0.6 mg/dL.  COMPARISON:  Right upper quadrant ultrasound dated 01/17/2018  FINDINGS: Lower chest: Linear scarring/atelectasis in the lingula and right middle lobe.  Hepatobiliary: Unenhanced liver is unremarkable.  Gallbladder is unremarkable. No intrahepatic or extrahepatic ductal dilatation.  Pancreas: 3.1 x 3.4 x 3.6 cm unilocular cystic lesion in the pancreatic head/uncinate process, measuring central fluid density without internal complexity or solid components, with a mildly thickened rim notable for faint peripheral  rim calcifications, and without associated parenchymal atrophy or ductal dilatation. This appearance favors a pseudocyst, with side branch IPMN considered unlikely.  Spleen: Within normal limits.  Adrenals/Urinary Tract: Adrenal glands are within normal limits.  Multiple bilateral renal cysts, measuring up to 10.6 cm in the lateral left upper kidney (series 5/image 86), with additional bilateral renal sinus cysts. No enhancing renal lesions. No renal calculi or hydronephrosis.  Stomach/Bowel: Stomach is notable for a tiny hiatal hernia.  Visualized bowel is grossly unremarkable.  Vascular/Lymphatic: No evidence of abdominal aortic aneurysm.  Atherosclerotic calcifications of the abdominal aorta and branch vessels.  No suspicious abdominal lymphadenopathy.  Other: No abdominal ascites.  Musculoskeletal: Degenerative changes of the visualized thoracolumbar spine.  IMPRESSION: 3.6 cm unilocular cystic lesion in the pancreatic head/uncinate process, favoring a benign pseudocyst.  This is considered low risk by imaging criteria, and follow-up CT or MRI abdomen with/without contrast every 6 months for a total of 2 years would be considered appropriate conservative management.  If a more aggressive approach is desired given lesion size, consider EUS/FNA.   Electronically Signed   By: Julian Hy M.D.   On: 02/05/2018 13:21  Assessment: 1.  Change in bowel habits: to looser stools with abdominal pain since TURP, history of chronic constipation for which patient is still taking Senokot plus docusate daily but was on antibiotics around procedure time; consider antibiotic related diarrhea versus pancreatic insufficiency vs laxative  use 2.  Abnormal CT pancreas: Showing a cystic lesion as above, PCP electing to follow this every 6 months 3.  History of adenomatous polyps: 6 adenomas in 2018, repeat recommended in 1 year  Plan: 1.  Ordered stool studies  including a GI pathogen panel and pancreatic fecal elastase 2.  Scheduled patient for his surveillance colonoscopy due to history of adenomatous polyps last year.  Scheduled with Dr. Henrene Pastor in the [facility].  Did discuss risk, benefits, limitations and alternatives and the patient agrees to proceed. 3.  Would recommend patient discontinue Senokot at this time.  He should start using his MiraLAX up to 4 times a day for bowel movements as needed. 4.  Patient will follow with his PCP regarding cystic lesion in pancreas per him 5.  Patient will follow in clinic per recommendations of Dr. Henrene Pastor after time of procedure.  Ellouise Newer, PA-C [facility] 02/12/2018, 3:19 PM  Cc: Leanna Battles, MD

## 2018-02-13 ENCOUNTER — Other Ambulatory Visit: Payer: PPO

## 2018-02-13 ENCOUNTER — Encounter: Payer: Self-pay | Admitting: Internal Medicine

## 2018-02-13 DIAGNOSIS — R194 Change in bowel habit: Secondary | ICD-10-CM | POA: Diagnosis not present

## 2018-02-13 DIAGNOSIS — Z86010 Personal history of colonic polyps: Secondary | ICD-10-CM | POA: Diagnosis not present

## 2018-02-13 DIAGNOSIS — R9389 Abnormal findings on diagnostic imaging of other specified body structures: Secondary | ICD-10-CM | POA: Diagnosis not present

## 2018-02-13 NOTE — Progress Notes (Signed)
Assessment and plans noted ?

## 2018-02-14 LAB — TIQ-NTM

## 2018-02-20 ENCOUNTER — Ambulatory Visit (AMBULATORY_SURGERY_CENTER): Payer: PPO | Admitting: Internal Medicine

## 2018-02-20 ENCOUNTER — Encounter: Payer: PPO | Admitting: Internal Medicine

## 2018-02-20 ENCOUNTER — Encounter: Payer: Self-pay | Admitting: Internal Medicine

## 2018-02-20 VITALS — BP 120/59 | HR 71 | Temp 96.6°F | Resp 16 | Ht 72.0 in | Wt 204.0 lb

## 2018-02-20 DIAGNOSIS — I440 Atrioventricular block, first degree: Secondary | ICD-10-CM | POA: Diagnosis not present

## 2018-02-20 DIAGNOSIS — D123 Benign neoplasm of transverse colon: Secondary | ICD-10-CM

## 2018-02-20 DIAGNOSIS — D120 Benign neoplasm of cecum: Secondary | ICD-10-CM

## 2018-02-20 DIAGNOSIS — Z86010 Personal history of colonic polyps: Secondary | ICD-10-CM | POA: Diagnosis not present

## 2018-02-20 DIAGNOSIS — E669 Obesity, unspecified: Secondary | ICD-10-CM | POA: Diagnosis not present

## 2018-02-20 DIAGNOSIS — I10 Essential (primary) hypertension: Secondary | ICD-10-CM | POA: Diagnosis not present

## 2018-02-20 MED ORDER — SODIUM CHLORIDE 0.9 % IV SOLN: 500.0000 mL | Freq: Once | INTRAVENOUS | Status: DC

## 2018-02-20 NOTE — Progress Notes (Signed)
Report to PACU, RN, vss, BBS= Clear.  

## 2018-02-20 NOTE — Op Note (Signed)
[place] Patient Name: David Gutierrez Procedure Date: 02/20/2018 1:12 PM MRN: [id number] Endoscopist: [name]. Henrene Pastor , MD Age: 84 Referring MD:  Date of Birth: 02/12/1933 Gender: Male Account #: [id number] Procedure:                Colonoscopy, with cold snare polypectomy x 3 Indications:              High risk colon cancer surveillance: Personal                            history of adenoma (10 mm or greater in size), High                            risk colon cancer surveillance: Personal history of                            multiple (3 or more) adenomas. Most recent                            examinations 2017 and 2018 Medicines:                Monitored Anesthesia Care Procedure:                Pre-Anesthesia Assessment:                           - Prior to the procedure, a History and Physical                            was performed, and patient medications and                            allergies were reviewed. The patient's tolerance of                            previous anesthesia was also reviewed. The risks                            and benefits of the procedure and the sedation                            options and risks were discussed with the patient.                            All questions were answered, and informed consent                            was obtained. Prior Anticoagulants: The patient has                            taken no previous anticoagulant or antiplatelet                            agents. ASA Grade Assessment: II - A patient with  mild systemic disease. After reviewing the risks                            and benefits, the patient was deemed in                            satisfactory condition to undergo the procedure.                           After obtaining informed consent, the colonoscope                            was passed under direct vision. Throughout the                            procedure, the  patient's blood pressure, pulse, and                            oxygen saturations were monitored continuously. The                            Colonoscope was introduced through the anus and                            advanced to the the cecum, identified by                            appendiceal orifice and ileocecal valve. The                            ileocecal valve, appendiceal orifice, and rectum                            were photographed. The quality of the bowel                            preparation was excellent. The colonoscopy was                            performed without difficulty. The patient tolerated                            the procedure well. The bowel preparation used was                            SUPREP. Scope In: 1:25:36 PM Scope Out: 1:47:08 PM Scope Withdrawal Time: 0 hours 16 minutes 59 seconds  Total Procedure Duration: 0 hours 21 minutes 32 seconds  Findings:                 Three sessile polyps were found in the transverse                            colon and cecum. The polyps were 5 to 7 mm in size.  These polyps were removed with a cold snare.                            Resection and retrieval were complete.                           Multiple diverticula were found in the left colon.                           Internal hemorrhoids were found during retroflexion.                           The exam was otherwise without abnormality on                            direct and retroflexion views. Complications:            No immediate complications. Estimated blood loss:                            None. Estimated Blood Loss:     Estimated blood loss: none. Impression:               - Three 5 to 7 mm polyps in the transverse colon                            and in the cecum, removed with a cold snare.                            Resected and retrieved.                           - Diverticulosis in the left colon.                            - Internal hemorrhoids.                           - The examination was otherwise normal on direct                            and retroflexion views. Recommendation:           - Repeat colonoscopy is not recommended for                            surveillance.                           - Patient has a contact number available for                            emergencies. The signs and symptoms of potential                            delayed complications were discussed with the  patient. Return to normal activities tomorrow.                            Written discharge instructions were provided to the                            patient.                           - Resume previous diet.                           - Continue present medications.                           - Await pathology results. [name]. Henrene Pastor, MD 02/20/2018 1:58:01 PM This report has been signed electronically.

## 2018-02-20 NOTE — Patient Instructions (Addendum)
Handout given: Polyps, Diverticulosis and Hemorrhoids.   YOU HAD AN ENDOSCOPIC PROCEDURE TODAY AT [facility]:   Refer to the procedure report that was given to you for any specific questions about what was found during the examination.  If the procedure report does not answer your questions, please call your gastroenterologist to clarify.  If you requested that your care partner not be given the details of your procedure findings, then the procedure report has been included in a sealed envelope for you to review at your convenience later.  YOU SHOULD EXPECT: Some feelings of bloating in the abdomen. Passage of more gas than usual.  Walking can help get rid of the air that was put into your GI tract during the procedure and reduce the bloating. If you had a lower endoscopy (such as a colonoscopy or flexible sigmoidoscopy) you may notice spotting of blood in your stool or on the toilet paper. If you underwent a bowel prep for your procedure, you may not have a normal bowel movement for a few days.  Please Note:  You might notice some irritation and congestion in your nose or some drainage.  This is from the oxygen used during your procedure.  There is no need for concern and it should clear up in a day or so.  SYMPTOMS TO REPORT IMMEDIATELY:   Following lower endoscopy (colonoscopy or flexible sigmoidoscopy):  Excessive amounts of blood in the stool  Significant tenderness or worsening of abdominal pains  Swelling of the abdomen that is new, acute  Fever of 100F or higher   For urgent or emergent issues, a gastroenterologist can be reached at any hour by calling [phone].   DIET:  We do recommend a small meal at first, but then you may proceed to your regular diet.  Drink plenty of fluids but you should avoid alcoholic beverages for 24 hours.  ACTIVITY:  You should plan to take it easy for the rest of today and you should NOT DRIVE or use heavy machinery until tomorrow  (because of the sedation medicines used during the test).    FOLLOW UP: Our staff will call the number listed on your records the next business day following your procedure to check on you and address any questions or concerns that you may have regarding the information given to you following your procedure. If we do not reach you, we will leave a message.  However, if you are feeling well and you are not experiencing any problems, there is no need to return our call.  We will assume that you have returned to your regular daily activities without incident.  If any biopsies were taken you will be contacted by phone or by letter within the next 1-3 weeks.  Please call us at [phone] if you have not heard about the biopsies in 3 weeks.    SIGNATURES/CONFIDENTIALITY: You and/or your care partner have signed paperwork which will be entered into your electronic medical record.  These signatures attest to the fact that that the information above on your After Visit Summary has been reviewed and is understood.  Full responsibility of the confidentiality of this discharge information lies with you and/or your care-partner.

## 2018-02-20 NOTE — Progress Notes (Signed)
I have reviewed the patient's medical history in detail and updated the computerized patient record.

## 2018-02-20 NOTE — Progress Notes (Signed)
Called to room to assist during endoscopic procedure.  Patient ID and intended procedure confirmed with present staff. Received instructions for my participation in the procedure from the performing physician.  

## 2018-02-21 ENCOUNTER — Telehealth: Payer: Self-pay | Admitting: *Deleted

## 2018-02-21 NOTE — Telephone Encounter (Signed)
  Follow up Call-  Call back number 02/20/2018 03/01/2017 08/03/2016 08/11/2015  Post procedure Call Back phone  # [phone]  Permission to leave phone message Yes Yes Yes Yes  Some recent data might be hidden     Patient questions:  Do you have a fever, pain , or abdominal swelling? No. Pain Score  0 *  Have you tolerated food without any problems? Yes.    Have you been able to return to your normal activities? Yes.    Do you have any questions about your discharge instructions: Diet   No. Medications  No. Follow up visit  No.  Do you have questions or concerns about your Care? No.  Actions: * If pain score is 4 or above: No action needed, pain <4.

## 2018-02-22 LAB — GASTROINTESTINAL PATHOGEN PANEL PCR
C. difficile Tox A/B, PCR: NOT DETECTED
Campylobacter, PCR: NOT DETECTED
Cryptosporidium, PCR: NOT DETECTED
E coli (ETEC) LT/ST PCR: NOT DETECTED
E coli (STEC) stx1/stx2, PCR: NOT DETECTED
E coli 0157, PCR: NOT DETECTED
Giardia lamblia, PCR: NOT DETECTED
Norovirus, PCR: NOT DETECTED
Rotavirus A, PCR: NOT DETECTED
Salmonella, PCR: NOT DETECTED
Shigella, PCR: NOT DETECTED

## 2018-02-22 LAB — PANCREATIC ELASTASE, FECAL: Pancreatic Elastase-1, Stool: >500 ug/g

## 2018-03-01 ENCOUNTER — Encounter: Payer: Self-pay | Admitting: Internal Medicine

## 2018-03-02 DIAGNOSIS — Z125 Encounter for screening for malignant neoplasm of prostate: Secondary | ICD-10-CM | POA: Diagnosis not present

## 2018-03-02 DIAGNOSIS — I10 Essential (primary) hypertension: Secondary | ICD-10-CM | POA: Diagnosis not present

## 2018-03-02 DIAGNOSIS — R82998 Other abnormal findings in urine: Secondary | ICD-10-CM | POA: Diagnosis not present

## 2018-03-02 DIAGNOSIS — E785 Hyperlipidemia, unspecified: Secondary | ICD-10-CM | POA: Diagnosis not present

## 2018-03-08 DIAGNOSIS — I2510 Atherosclerotic heart disease of native coronary artery without angina pectoris: Secondary | ICD-10-CM | POA: Diagnosis not present

## 2018-03-08 DIAGNOSIS — G629 Polyneuropathy, unspecified: Secondary | ICD-10-CM | POA: Diagnosis not present

## 2018-03-08 DIAGNOSIS — N3010 Interstitial cystitis (chronic) without hematuria: Secondary | ICD-10-CM | POA: Diagnosis not present

## 2018-03-08 DIAGNOSIS — M545 Low back pain: Secondary | ICD-10-CM | POA: Diagnosis not present

## 2018-03-08 DIAGNOSIS — Z6828 Body mass index (BMI) 28.0-28.9, adult: Secondary | ICD-10-CM | POA: Diagnosis not present

## 2018-03-08 DIAGNOSIS — Z1389 Encounter for screening for other disorder: Secondary | ICD-10-CM | POA: Diagnosis not present

## 2018-03-08 DIAGNOSIS — E7849 Other hyperlipidemia: Secondary | ICD-10-CM | POA: Diagnosis not present

## 2018-03-08 DIAGNOSIS — K862 Cyst of pancreas: Secondary | ICD-10-CM | POA: Diagnosis not present

## 2018-03-08 DIAGNOSIS — I10 Essential (primary) hypertension: Secondary | ICD-10-CM | POA: Diagnosis not present

## 2018-03-08 DIAGNOSIS — Z0000 Encounter for general adult medical examination without abnormal findings: Secondary | ICD-10-CM | POA: Diagnosis not present

## 2018-03-08 DIAGNOSIS — N319 Neuromuscular dysfunction of bladder, unspecified: Secondary | ICD-10-CM | POA: Diagnosis not present

## 2018-03-16 DIAGNOSIS — N3000 Acute cystitis without hematuria: Secondary | ICD-10-CM | POA: Diagnosis not present

## 2018-03-16 DIAGNOSIS — R8279 Other abnormal findings on microbiological examination of urine: Secondary | ICD-10-CM | POA: Diagnosis not present

## 2018-03-16 DIAGNOSIS — R3914 Feeling of incomplete bladder emptying: Secondary | ICD-10-CM | POA: Diagnosis not present

## 2018-03-16 DIAGNOSIS — N312 Flaccid neuropathic bladder, not elsewhere classified: Secondary | ICD-10-CM | POA: Diagnosis not present

## 2018-03-23 DIAGNOSIS — N139 Obstructive and reflux uropathy, unspecified: Secondary | ICD-10-CM | POA: Diagnosis not present

## 2018-03-23 DIAGNOSIS — N312 Flaccid neuropathic bladder, not elsewhere classified: Secondary | ICD-10-CM | POA: Diagnosis not present

## 2018-03-29 DIAGNOSIS — N312 Flaccid neuropathic bladder, not elsewhere classified: Secondary | ICD-10-CM | POA: Diagnosis not present

## 2018-03-29 DIAGNOSIS — N401 Enlarged prostate with lower urinary tract symptoms: Secondary | ICD-10-CM | POA: Diagnosis not present

## 2018-03-29 DIAGNOSIS — N3020 Other chronic cystitis without hematuria: Secondary | ICD-10-CM | POA: Diagnosis not present

## 2018-03-29 DIAGNOSIS — N3001 Acute cystitis with hematuria: Secondary | ICD-10-CM | POA: Diagnosis not present

## 2018-03-29 DIAGNOSIS — R3914 Feeling of incomplete bladder emptying: Secondary | ICD-10-CM | POA: Diagnosis not present

## 2018-03-30 DIAGNOSIS — N312 Flaccid neuropathic bladder, not elsewhere classified: Secondary | ICD-10-CM | POA: Diagnosis not present

## 2018-03-30 DIAGNOSIS — N401 Enlarged prostate with lower urinary tract symptoms: Secondary | ICD-10-CM | POA: Diagnosis not present

## 2018-03-30 DIAGNOSIS — N3000 Acute cystitis without hematuria: Secondary | ICD-10-CM | POA: Diagnosis not present

## 2018-03-30 DIAGNOSIS — R3914 Feeling of incomplete bladder emptying: Secondary | ICD-10-CM | POA: Diagnosis not present

## 2018-04-05 DIAGNOSIS — N3000 Acute cystitis without hematuria: Secondary | ICD-10-CM | POA: Diagnosis not present

## 2018-04-05 DIAGNOSIS — N312 Flaccid neuropathic bladder, not elsewhere classified: Secondary | ICD-10-CM | POA: Diagnosis not present

## 2018-04-06 DIAGNOSIS — N312 Flaccid neuropathic bladder, not elsewhere classified: Secondary | ICD-10-CM | POA: Diagnosis not present

## 2018-04-06 DIAGNOSIS — N320 Bladder-neck obstruction: Secondary | ICD-10-CM | POA: Diagnosis not present

## 2018-04-09 ENCOUNTER — Other Ambulatory Visit: Payer: Self-pay | Admitting: Urology

## 2018-04-10 ENCOUNTER — Encounter (HOSPITAL_COMMUNITY): Payer: Self-pay

## 2018-04-10 ENCOUNTER — Encounter (HOSPITAL_COMMUNITY): Payer: Self-pay | Admitting: *Deleted

## 2018-04-10 ENCOUNTER — Other Ambulatory Visit: Payer: Self-pay

## 2018-04-10 ENCOUNTER — Encounter (HOSPITAL_COMMUNITY)
Admission: RE | Admit: 2018-04-10 | Discharge: 2018-04-10 | Disposition: A | Discharge status: Home / Self Care | Payer: PPO | Source: Ambulatory Visit | Attending: Urology | Admitting: Urology

## 2018-04-10 DIAGNOSIS — Z79899 Other long term (current) drug therapy: Secondary | ICD-10-CM | POA: Diagnosis not present

## 2018-04-10 DIAGNOSIS — Z8249 Family history of ischemic heart disease and other diseases of the circulatory system: Secondary | ICD-10-CM | POA: Diagnosis not present

## 2018-04-10 DIAGNOSIS — N320 Bladder-neck obstruction: Secondary | ICD-10-CM | POA: Diagnosis not present

## 2018-04-10 DIAGNOSIS — I493 Ventricular premature depolarization: Secondary | ICD-10-CM | POA: Diagnosis not present

## 2018-04-10 DIAGNOSIS — I429 Cardiomyopathy, unspecified: Secondary | ICD-10-CM | POA: Diagnosis not present

## 2018-04-10 DIAGNOSIS — I2510 Atherosclerotic heart disease of native coronary artery without angina pectoris: Secondary | ICD-10-CM | POA: Diagnosis not present

## 2018-04-10 DIAGNOSIS — I447 Left bundle-branch block, unspecified: Secondary | ICD-10-CM | POA: Diagnosis not present

## 2018-04-10 DIAGNOSIS — Z87440 Personal history of urinary (tract) infections: Secondary | ICD-10-CM | POA: Diagnosis not present

## 2018-04-10 DIAGNOSIS — N319 Neuromuscular dysfunction of bladder, unspecified: Secondary | ICD-10-CM | POA: Diagnosis not present

## 2018-04-10 DIAGNOSIS — R3914 Feeling of incomplete bladder emptying: Secondary | ICD-10-CM | POA: Diagnosis not present

## 2018-04-10 DIAGNOSIS — K219 Gastro-esophageal reflux disease without esophagitis: Secondary | ICD-10-CM | POA: Diagnosis not present

## 2018-04-10 DIAGNOSIS — I440 Atrioventricular block, first degree: Secondary | ICD-10-CM | POA: Diagnosis not present

## 2018-04-10 DIAGNOSIS — K589 Irritable bowel syndrome without diarrhea: Secondary | ICD-10-CM | POA: Diagnosis not present

## 2018-04-10 DIAGNOSIS — Z888 Allergy status to other drugs, medicaments and biological substances status: Secondary | ICD-10-CM | POA: Diagnosis not present

## 2018-04-10 DIAGNOSIS — N401 Enlarged prostate with lower urinary tract symptoms: Secondary | ICD-10-CM | POA: Diagnosis not present

## 2018-04-10 DIAGNOSIS — E039 Hypothyroidism, unspecified: Secondary | ICD-10-CM | POA: Diagnosis not present

## 2018-04-10 DIAGNOSIS — Z87891 Personal history of nicotine dependence: Secondary | ICD-10-CM | POA: Diagnosis not present

## 2018-04-10 DIAGNOSIS — M1990 Unspecified osteoarthritis, unspecified site: Secondary | ICD-10-CM | POA: Diagnosis not present

## 2018-04-10 DIAGNOSIS — G621 Alcoholic polyneuropathy: Secondary | ICD-10-CM | POA: Diagnosis not present

## 2018-04-10 DIAGNOSIS — E538 Deficiency of other specified B group vitamins: Secondary | ICD-10-CM | POA: Diagnosis not present

## 2018-04-10 DIAGNOSIS — Z96653 Presence of artificial knee joint, bilateral: Secondary | ICD-10-CM | POA: Diagnosis not present

## 2018-04-10 DIAGNOSIS — Z7982 Long term (current) use of aspirin: Secondary | ICD-10-CM | POA: Diagnosis not present

## 2018-04-10 LAB — BASIC METABOLIC PANEL
ANION GAP: 9 (ref 5–15)
BUN: 10 mg/dL (ref 6–20)
CALCIUM: 9.2 mg/dL (ref 8.9–10.3)
CO2: 24 mmol/L (ref 22–32)
Chloride: 107 mmol/L (ref 101–111)
Creatinine, Ser: 0.63 mg/dL (ref 0.61–1.24)
GFR calc Af Amer: >60 mL/min (ref >60)
GLUCOSE: 91 mg/dL (ref 65–99)
Potassium: 4.5 mmol/L (ref 3.5–5.1)
Sodium: 140 mmol/L (ref 135–145)

## 2018-04-10 LAB — CBC
HCT: 41.2 % (ref 39.0–52.0)
Hemoglobin: 13.4 g/dL (ref 13.0–17.0)
MCH: 30.9 pg (ref 26.0–34.0)
MCHC: 32.5 g/dL (ref 30.0–36.0)
MCV: 94.9 fL (ref 78.0–100.0)
PLATELETS: 100 10*3/uL — AB (ref 150–400)
RBC: 4.34 MIL/uL (ref 4.22–5.81)
RDW: 17.6 % — ABNORMAL HIGH (ref 11.5–15.5)
WBC: 5.3 10*3/uL (ref 4.0–10.5)

## 2018-04-10 LAB — HEMOGLOBIN A1C
Hgb A1c MFr Bld: 5.2 % (ref 4.8–5.6)
MEAN PLASMA GLUCOSE: 102.54 mg/dL

## 2018-04-10 NOTE — Progress Notes (Signed)
12/14/2017-ekg-epic- chronic LBBB per cardiologoy note of 12/14/2017- epic  [id number]

## 2018-04-10 NOTE — H&P (Signed)
Urology Preoperative H&P   Chief Complaint: Bladder neck contracture  History of Present Illness: David Gutierrez is a 84 y.o. male with a long history of BPH with lower urinary tract symptoms, neurogenic bladder and peripheral neuropathy.  He recently underwent a repeat TURP in January 2019 and did well initially after surgery.  However, over the past one to 2 months, the patient has had to perform intermittent catheterization to adequately empty his bladder and he has noticed that is becoming more difficult.  He is also having recurrent urinary tract infections.  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  He had a 12 [place] Foley catheter placed at that time and was started on empiric Levaquin daily.    Past Medical History:  Diagnosis Date  . Alcoholic peripheral neuropathy [facility])    neurologist-  dr patel--- feet numbness and sensory ataxia  . Arthritis   . B12 deficiency   . BPH (benign prostatic hyperplasia)   . CAD (coronary artery disease) cardiolgoist-  dr Harrell Gave end (previouly dr dalton [name])   a. Myoview 11/13:  EF 38%, inf and IS defect c/w scar but no ischemia:  b. Cardiac CT 11/13:  Ca score 318 Agatson units, pLAD and dCFX plaque;   c. LHC 11/07/12:  pLAD 30%, oD1 40%, oCFX 30%, dCFX 40-50%, mRCA 40%, EF 50% (frequent PVCs and short run of NSVT with injection/    per last echo 01/ 2017  ef 55-60%  . Chronic constipation   . Diverticulosis of colon   . Esophageal reflux   . External hemorrhoids   . First degree heart block   . Hiatal hernia   . History of adenomatous polyp of colon   . History of esophageal stricture 08/11/2015   s/p  dilatation  . History of lower GI bleeding 03/01/2017   s/p  flexiable simoidscopy w/ clipping rectum ulcer  . Hypogonadism male   . Hypothyroidism   . Interstitial cystitis   . Irritable bowel syndrome   . LBBB (left bundle branch block)   . NICM (nonischemic  cardiomyopathy) [facility]) cardiologist-  dr Harrell Gave end (previously dr dalton [name])--  per last echo 01/ 2017  ef 55-60%   ? 2/2 LBBB => a. echo 11/13: diff HK, worse in septum and apex, mod LVE, mild LVH, EF 40%, mild AI, mild MR, mod LAE  . Pre-diabetes   . Self-catheterizes urinary bladder    bid to tid   . Wears glasses   . Wears hearing aid in both ears     Past Surgical History:  Procedure Laterality Date  . CARDIAC CATHETERIZATION  11-07-2012    dr [name]   nonobstructive CAD (pLAD 30%, ostial D1 40%, ostial LCx 30%, dLCx diffuse 40-50%, mRCA 40%) ;  LVSF 50% but diffiult due to PVCs and short run VT with injection;  cardiomyopathy mostly likely a LBBB cardiomyopathy  . CARDIOVASCULAR STRESS TEST  10-16-2012   dr [name]   Low risk adenosine nuclear study (no exercise) w/ a fixed inferior and inferoseptal defect without ischemia (question as whether this abnormality due to LBBB cardiomyopathy or due to scar)/  LV ef 38%,  LV wall motion decreased of the septum and entire apex  . CATARACT EXTRACTION W/ INTRAOCULAR LENS  IMPLANT, BILATERAL  2012  approx.  . CYSTO/  HYDRODISTENTION/  BLADDER BIOPSY  04-20-2005    dr Lawerance Bach  [facility]  . ETHMOIDECTOMY Right 12/12/2016  Procedure: RIGHT ENDOSCOPIC ETHMOIDECTOMY;  Surgeon: [name], MD;  Location: [place];  Service: ENT;  Laterality: Right;  . FLEXIBLE SIGMOIDOSCOPY N/A 03/02/2017   Procedure: FLEXIBLE SIGMOIDOSCOPY;  Surgeon: Ladene Artist, MD;  Location: [facility];  Service: Endoscopy;  Laterality: N/A;  . [place]  . MAXILLARY ANTROSTOMY Right 12/12/2016   Procedure: RIGHT ENDOSCOPIC MAXILLARY ANTROSTOMY;  Surgeon: [name], MD;  Location: [place];  Service: ENT;  Laterality: Right;  . SINUS ENDO W/FUSION Right 12/12/2016   Procedure: ENDOSCOPIC SINUS SURGERY WITH NAVIGATION;  Surgeon: [name], MD;  Location: [place];  Service: ENT;  Laterality: Right;  . SPHENOIDECTOMY  Right 12/12/2016   Procedure: RIGHT ENDOSCOPIC SPHENOIDECTOMY;  Surgeon: [name], MD;  Location: [place];  Service: ENT;  Laterality: Right;  . TONSILLECTOMY AND ADENOIDECTOMY  child  . TOTAL KNEE ARTHROPLASTY Left 10/14/2014   Procedure: LEFT TOTAL KNEE ARTHROPLASTY;  Surgeon: [name], MD;  Location: [facility];  Service: Orthopedics;  Laterality: Left;  . TOTAL KNEE ARTHROPLASTY Right 1990s  . TRANSTHORACIC ECHOCARDIOGRAM  12-24-2015   dr [name]   ef 55-60%,  grade 1 diastolic dysfunction/  trivial AR and TR  mild MR and PR/  moderate LAE  . TRANSURETHRAL RESECTION OF PROSTATE  06-11-2010   dr Gaynelle Arabian  [facility]   w/  GYRUS  . TRANSURETHRAL RESECTION OF PROSTATE N/A 12/06/2017   Procedure: TRANSURETHRAL RESECTION OF THE PROSTATE (TURP)/ BIPOLAR;  Surgeon: Ceasar Mons, MD;  Location: [facility];  Service: Urology;  Laterality: N/A;    Allergies:  Allergies  Allergen Reactions  . Cyclobenzaprine Other (See Comments)    Lost balance  . Lisinopril Other (See Comments)    Affected ability to urinate  . Relafen [Nabumetone] Other (See Comments)    Problems urinating afterwards   . Statins Other (See Comments)    "my peeing"    Family History  Problem Relation Age of Onset  . Thyroid cancer Brother   . Healthy Mother   . Healthy Father   . Other Sister   . Heart disease Brother   . Healthy Sister   . Healthy Sister   . Diabetes type I Son   . Colon cancer Neg Hx     Social History:  reports that he quit smoking about 50 years ago. His smoking use included cigarettes. He has a 30.00 pack-year smoking history. He has never used smokeless tobacco. He reports that he drinks alcohol. He reports that he does not use drugs.  ROS: A complete review of systems was performed.  All systems are negative except for pertinent findings as noted.  Physical Exam:  Vital signs in last 24 hours: BP: ()/()  Arterial Line BP: ()/()  Constitutional:   Alert and oriented, No acute distress Cardiovascular: Regular rate and rhythm, No JVD Respiratory: Normal respiratory effort, Lungs clear bilaterally GI: Abdomen is soft, nontender, nondistended, no abdominal masses GU: No CVA tenderness Lymphatic: No lymphadenopathy Neurologic: Grossly intact, no focal deficits Psychiatric: Normal mood and affect  Laboratory Data:  No results for input(s): WBC, HGB, HCT, PLT in the last 72 hours.  No results for input(s): NA, K, CL, GLUCOSE, BUN, CALCIUM, CREATININE in the last 72 hours.  Invalid input(s): CO3   No results found for this or any previous visit (from the past 24 hour(s)). No results found for this or any previous visit (from the past 240 hour(s)).  Renal Function: No results for input(s): CREATININE in the last 168 hours. CrCl cannot be calculated (Patient's most recent lab result is older than the maximum 21 days allowed.).  Radiologic Imaging: No results found.  I independently reviewed the above imaging studies.  Assessment and Plan David Gutierrez is a 84 y.o. male with a bladder neck contracture  -The risks, benefits and alternatives of cystoscopy with transurethral incision of the prostate and possible urethral dilation was discussed with the patient.  Risks include, but are not limited to, bleeding, urinary tract infection, the potential need for an indwelling urethral catheter, worsening urethral stricture disease, MI, stroke, PE and the inherent risk of general anesthesia.  He voices understanding and wishes to proceed.  Ellison Hughs, MD 04/10/2018, 10:57 AM  [facility] Pager: [phone]

## 2018-04-10 NOTE — Progress Notes (Signed)
04-10-18 CBC results with Platelet of 100. CBC results routed to Dr. Gilford Rile' office. Also spoke to Dr. Lovena Neighbours, who indicate that   Platelets can be order the day of surgery.

## 2018-04-10 NOTE — Patient Instructions (Addendum)
David Gutierrez  04/10/2018   Your procedure is scheduled on: 04/11/2018   Report to [facility] Main  Entrance    Report to admitting at   1015 AM    Call this number if you have problems the morning of surgery [phone]   Remember: Do not eat food or drink liquids :After Midnight.     Take these medicines the morning of surgery with A SIP OF WATER Carvedilol ( Coreg) and Prilosec,                                You may not have any metal on your body including hair pins and              piercings  Do not wear jewelry,  lotions, powders or deodorant          .  Men may shave face and neck.   Do not bring valuables to the hospital. [place].  Contacts, dentures or bridgework may not be worn into surgery.      Patients discharged the day of surgery will not be allowed to drive home.  Name and phone number of your driver:[facility]  [employer] [phone]                Please read over the following fact sheets you were given: _____________________________________________________________________             [facility] - Preparing for Surgery Before surgery, you can play an important role.  Because skin is not sterile, your skin needs to be as free of germs as possible.  You can reduce the number of germs on your skin by washing with CHG (chlorahexidine gluconate) soap before surgery.  CHG is an antiseptic cleaner which kills germs and bonds with the skin to continue killing germs even after washing. Please DO NOT use if you have an allergy to CHG or antibacterial soaps.  If your skin becomes reddened/irritated stop using the CHG and inform your nurse when you arrive at Short Stay. Do not shave (including legs and underarms) for at least 48 hours prior to the first CHG shower.  You may shave your face/neck. Please follow these instructions carefully:  1.  Shower with CHG Soap the night before surgery and the   morning of Surgery.  2.  If you choose to wash your hair, wash your hair first as usual with your  normal  shampoo.  3.  After you shampoo, rinse your hair and body thoroughly to remove the  shampoo.                           4.  Use CHG as you would any other liquid soap.  You can apply chg directly  to the skin and wash                       Gently with a scrungie or clean washcloth.  5.  Apply the CHG Soap to your body ONLY FROM THE NECK DOWN.   Do not use on face/ open  Wound or open sores. Avoid contact with eyes, ears mouth and genitals (private parts).                       Wash face,  Genitals (private parts) with your normal soap.             6.  Wash thoroughly, paying special attention to the area where your surgery  will be performed.  7.  Thoroughly rinse your body with warm water from the neck down.  8.  DO NOT shower/wash with your normal soap after using and rinsing off  the CHG Soap.                9.  Pat yourself dry with a clean towel.            10.  Wear clean pajamas.            11.  Place clean sheets on your bed the night of your first shower and do not  sleep with pets. Day of Surgery : Do not apply any lotions/deodorants the morning of surgery.  Please wear clean clothes to the hospital/surgery center.  FAILURE TO FOLLOW THESE INSTRUCTIONS MAY RESULT IN THE CANCELLATION OF YOUR SURGERY PATIENT SIGNATURE_________________________________  NURSE SIGNATURE__________________________________  ________________________________________________________________________

## 2018-04-11 ENCOUNTER — Encounter (HOSPITAL_COMMUNITY)
Admission: RE | Disposition: A | Discharge status: Home / Self Care | Payer: Self-pay | Source: Ambulatory Visit | Attending: Urology

## 2018-04-11 ENCOUNTER — Ambulatory Visit (HOSPITAL_COMMUNITY)
Admission: RE | Admit: 2018-04-11 | Discharge: 2018-04-11 | Disposition: A | Discharge status: Home / Self Care | Payer: PPO | Source: Ambulatory Visit | Attending: Urology | Admitting: Urology

## 2018-04-11 ENCOUNTER — Encounter (HOSPITAL_COMMUNITY): Payer: Self-pay | Admitting: *Deleted

## 2018-04-11 ENCOUNTER — Ambulatory Visit (HOSPITAL_COMMUNITY): Payer: PPO | Admitting: Certified Registered Nurse Anesthetist

## 2018-04-11 DIAGNOSIS — Z87891 Personal history of nicotine dependence: Secondary | ICD-10-CM | POA: Insufficient documentation

## 2018-04-11 DIAGNOSIS — E039 Hypothyroidism, unspecified: Secondary | ICD-10-CM | POA: Insufficient documentation

## 2018-04-11 DIAGNOSIS — N138 Other obstructive and reflux uropathy: Secondary | ICD-10-CM | POA: Diagnosis not present

## 2018-04-11 DIAGNOSIS — N400 Enlarged prostate without lower urinary tract symptoms: Secondary | ICD-10-CM | POA: Diagnosis not present

## 2018-04-11 DIAGNOSIS — N320 Bladder-neck obstruction: Secondary | ICD-10-CM | POA: Insufficient documentation

## 2018-04-11 DIAGNOSIS — Z888 Allergy status to other drugs, medicaments and biological substances status: Secondary | ICD-10-CM | POA: Insufficient documentation

## 2018-04-11 DIAGNOSIS — I447 Left bundle-branch block, unspecified: Secondary | ICD-10-CM | POA: Diagnosis not present

## 2018-04-11 DIAGNOSIS — Z87440 Personal history of urinary (tract) infections: Secondary | ICD-10-CM | POA: Insufficient documentation

## 2018-04-11 DIAGNOSIS — Z7982 Long term (current) use of aspirin: Secondary | ICD-10-CM | POA: Insufficient documentation

## 2018-04-11 DIAGNOSIS — I2510 Atherosclerotic heart disease of native coronary artery without angina pectoris: Secondary | ICD-10-CM | POA: Diagnosis not present

## 2018-04-11 DIAGNOSIS — E538 Deficiency of other specified B group vitamins: Secondary | ICD-10-CM | POA: Insufficient documentation

## 2018-04-11 DIAGNOSIS — N401 Enlarged prostate with lower urinary tract symptoms: Secondary | ICD-10-CM | POA: Diagnosis not present

## 2018-04-11 DIAGNOSIS — R3914 Feeling of incomplete bladder emptying: Secondary | ICD-10-CM | POA: Diagnosis not present

## 2018-04-11 DIAGNOSIS — K219 Gastro-esophageal reflux disease without esophagitis: Secondary | ICD-10-CM | POA: Insufficient documentation

## 2018-04-11 DIAGNOSIS — I440 Atrioventricular block, first degree: Secondary | ICD-10-CM | POA: Diagnosis not present

## 2018-04-11 DIAGNOSIS — M1990 Unspecified osteoarthritis, unspecified site: Secondary | ICD-10-CM | POA: Insufficient documentation

## 2018-04-11 DIAGNOSIS — I493 Ventricular premature depolarization: Secondary | ICD-10-CM | POA: Diagnosis not present

## 2018-04-11 DIAGNOSIS — G621 Alcoholic polyneuropathy: Secondary | ICD-10-CM | POA: Diagnosis not present

## 2018-04-11 DIAGNOSIS — N319 Neuromuscular dysfunction of bladder, unspecified: Secondary | ICD-10-CM | POA: Insufficient documentation

## 2018-04-11 DIAGNOSIS — Z96653 Presence of artificial knee joint, bilateral: Secondary | ICD-10-CM | POA: Insufficient documentation

## 2018-04-11 DIAGNOSIS — Z8249 Family history of ischemic heart disease and other diseases of the circulatory system: Secondary | ICD-10-CM | POA: Insufficient documentation

## 2018-04-11 DIAGNOSIS — I429 Cardiomyopathy, unspecified: Secondary | ICD-10-CM | POA: Insufficient documentation

## 2018-04-11 DIAGNOSIS — K589 Irritable bowel syndrome without diarrhea: Secondary | ICD-10-CM | POA: Insufficient documentation

## 2018-04-11 DIAGNOSIS — Z79899 Other long term (current) drug therapy: Secondary | ICD-10-CM | POA: Insufficient documentation

## 2018-04-11 HISTORY — DX: Prediabetes: R73.03

## 2018-04-11 HISTORY — PX: TRANSURETHRAL RESECTION OF PROSTATE: SHX73

## 2018-04-11 SURGERY — TURP (TRANSURETHRAL RESECTION OF PROSTATE)
Anesthesia: General

## 2018-04-11 MED ORDER — SODIUM CHLORIDE 0.9 % IR SOLN
Status: DC | PRN
  Administered 2018-04-11: 12000 mL

## 2018-04-11 MED ORDER — PROPOFOL 10 MG/ML IV BOLUS
INTRAVENOUS | Status: DC | PRN
  Administered 2018-04-11: 160 mg via INTRAVENOUS

## 2018-04-11 MED ORDER — PHENYLEPHRINE 40 MCG/ML (10ML) SYRINGE FOR IV PUSH (FOR BLOOD PRESSURE SUPPORT)
PREFILLED_SYRINGE | INTRAVENOUS | Status: AC
  Filled 2018-04-11: qty 10

## 2018-04-11 MED ORDER — 0.9 % SODIUM CHLORIDE (POUR BTL) OPTIME
TOPICAL | Status: DC | PRN
  Administered 2018-04-11: 1000 mL

## 2018-04-11 MED ORDER — LIDOCAINE 2% (20 MG/ML) 5 ML SYRINGE
INTRAMUSCULAR | Status: AC
  Filled 2018-04-11: qty 5

## 2018-04-11 MED ORDER — PROPOFOL 10 MG/ML IV BOLUS
INTRAVENOUS | Status: AC
  Filled 2018-04-11: qty 20

## 2018-04-11 MED ORDER — ACETAMINOPHEN 10 MG/ML IV SOLN: 1000.0000 mg | Freq: Once | INTRAVENOUS | Status: DC | PRN

## 2018-04-11 MED ORDER — PHENYLEPHRINE 40 MCG/ML (10ML) SYRINGE FOR IV PUSH (FOR BLOOD PRESSURE SUPPORT)
PREFILLED_SYRINGE | INTRAVENOUS | Status: DC | PRN
  Administered 2018-04-11 (×6): 80 ug via INTRAVENOUS

## 2018-04-11 MED ORDER — FENTANYL CITRATE (PF) 100 MCG/2ML IJ SOLN: 25.0000 ug | INTRAMUSCULAR | Status: DC | PRN

## 2018-04-11 MED ORDER — LIDOCAINE 2% (20 MG/ML) 5 ML SYRINGE
INTRAMUSCULAR | Status: DC | PRN
  Administered 2018-04-11: 80 mg via INTRAVENOUS

## 2018-04-11 MED ORDER — ONDANSETRON HCL 4 MG/2ML IJ SOLN
INTRAMUSCULAR | Status: AC
  Filled 2018-04-11: qty 2

## 2018-04-11 MED ORDER — ONDANSETRON HCL 4 MG/2ML IJ SOLN: 4.0000 mg | Freq: Once | INTRAMUSCULAR | Status: DC | PRN

## 2018-04-11 MED ORDER — HYDROCODONE-ACETAMINOPHEN 5-325 MG PO TABS: 1.0000 | ORAL_TABLET | ORAL | 0 refills | Status: DC | PRN

## 2018-04-11 MED ORDER — CIPROFLOXACIN IN D5W 400 MG/200ML IV SOLN
400.0000 mg | Freq: Once | INTRAVENOUS | Status: AC
  Administered 2018-04-11: 400 mg via INTRAVENOUS
  Filled 2018-04-11: qty 200

## 2018-04-11 MED ORDER — LACTATED RINGERS IV SOLN
INTRAVENOUS | Status: DC
  Administered 2018-04-11: 11:00:00 via INTRAVENOUS

## 2018-04-11 MED ORDER — FENTANYL CITRATE (PF) 100 MCG/2ML IJ SOLN
INTRAMUSCULAR | Status: AC
  Filled 2018-04-11: qty 2

## 2018-04-11 MED ORDER — FENTANYL CITRATE (PF) 100 MCG/2ML IJ SOLN
INTRAMUSCULAR | Status: DC | PRN
  Administered 2018-04-11 (×2): 50 ug via INTRAVENOUS

## 2018-04-11 MED ORDER — PHENAZOPYRIDINE HCL 200 MG PO TABS: 200.0000 mg | ORAL_TABLET | Freq: Three times a day (TID) | ORAL | 0 refills | Status: DC | PRN

## 2018-04-11 MED ORDER — ONDANSETRON HCL 4 MG PO TABS: 4.0000 mg | ORAL_TABLET | Freq: Every day | ORAL | 1 refills | Status: DC | PRN

## 2018-04-11 MED ORDER — ONDANSETRON HCL 4 MG/2ML IJ SOLN
INTRAMUSCULAR | Status: DC | PRN
  Administered 2018-04-11: 4 mg via INTRAVENOUS

## 2018-04-11 SURGICAL SUPPLY — 31 items
BAG URINE DRAINAGE (UROLOGICAL SUPPLIES) ×1 IMPLANT
BAG URO CATCHER STRL LF (MISCELLANEOUS) ×2 IMPLANT
BALLN NEPHROSTOMY (BALLOONS)
BALLOON NEPHROSTOMY (BALLOONS) IMPLANT
CATH FOLEY 2W COUNCIL 20FR 5CC (CATHETERS) IMPLANT
CATH FOLEY 3WAY 30CC 22FR (CATHETERS) ×1 IMPLANT
CATH ROBINSON RED A/P 14FR (CATHETERS) ×1 IMPLANT
CATH URET 5FR 28IN CONE TIP (BALLOONS)
CATH URET 5FR 28IN OPEN ENDED (CATHETERS) ×1 IMPLANT
CATH URET 5FR 70CM CONE TIP (BALLOONS) IMPLANT
CLOTH BEACON ORANGE TIMEOUT ST (SAFETY) ×2 IMPLANT
COVER FOOTSWITCH UNIV (MISCELLANEOUS) ×1 IMPLANT
COVER SURGICAL LIGHT HANDLE (MISCELLANEOUS) ×2 IMPLANT
GLOVE BIO SURGEON STRL SZ7.5 (GLOVE) ×2 IMPLANT
GLOVE BIOGEL M STRL SZ7.5 (GLOVE) ×2 IMPLANT
GOWN STRL REUS W/TWL LRG LVL3 (GOWN DISPOSABLE) ×1 IMPLANT
GOWN STRL REUS W/TWL XL LVL3 (GOWN DISPOSABLE) ×2 IMPLANT
GUIDEWIRE ANG ZIPWIRE 038X150 (WIRE) IMPLANT
GUIDEWIRE STR DUAL SENSOR (WIRE) ×2 IMPLANT
HOLDER FOLEY CATH W/STRAP (MISCELLANEOUS) IMPLANT
LOOP CUT BIPOLAR 24F LRG (ELECTROSURGICAL) ×1 IMPLANT
MANIFOLD NEPTUNE II (INSTRUMENTS) ×2 IMPLANT
NS IRRIG 1000ML POUR BTL (IV SOLUTION) IMPLANT
PACK CYSTO (CUSTOM PROCEDURE TRAY) ×2 IMPLANT
PIN SAFETY STERILE (MISCELLANEOUS) ×1 IMPLANT
RUBBERBAND STERILE (MISCELLANEOUS) ×1 IMPLANT
SET ASPIRATION TUBING (TUBING) IMPLANT
SYRINGE IRR TOOMEY STRL 70CC (SYRINGE) ×2 IMPLANT
TUBING CONNECTING 10 (TUBING) ×2 IMPLANT
TUBING UROLOGY SET (TUBING) ×1 IMPLANT
WATER STERILE IRR 3000ML UROMA (IV SOLUTION) ×1 IMPLANT

## 2018-04-11 NOTE — Discharge Instructions (Signed)
Transurethral Resection of Bladder Tumor, Care After Refer to this sheet in the next few weeks. These instructions provide you with information about caring for yourself after your procedure. Your health care provider may also give you more specific instructions. Your treatment has been planned according to current medical practices, but problems sometimes occur. Call your health care provider if you have any problems or questions after your procedure. What can I expect after the procedure? After the procedure, it is common to have:  A small amount of blood in your urine for up to 2 weeks.  Soreness or mild discomfort from your catheter. After your catheter is removed, you may have mild soreness, especially when urinating.  Pain in your lower abdomen.  Follow these instructions at home:  Medicines  Take over-the-counter and prescription medicines only as told by your health care provider.  Do not drive or operate heavy machinery while taking prescription pain medicine.  Do not drive for 24 hours if you received a sedative.  If you were prescribed antibiotic medicine, take it as told by your health care provider. Do not stop taking the antibiotic even if you start to feel better. Activity  Return to your normal activities as told by your health care provider. Ask your health care provider what activities are safe for you.  Do not lift anything that is heavier than 10 lb (4.5 kg) for as long as told by your health care provider.  Avoid intense physical activity for as long as told by your health care provider.  Walk at least one time every day. This helps to prevent blood clots. You may increase your physical activity gradually as you start to feel better. General instructions  Do not drink alcohol for as long as told by your health care provider. This is especially important if you are taking prescription pain medicines.  Do not take baths, swim, or use a hot tub until your health  care provider approves.  If you have a catheter, follow instructions from your health care provider about caring for your catheter and your drainage bag.  Drink enough fluid to keep your urine clear or pale yellow.  Wear compression stockings as told by your health care provider. These stockings help to prevent blood clots and reduce swelling in your legs.  Keep all follow-up visits as told by your health care provider. This is important. Contact a health care provider if:  You have pain that gets worse or does not improve with medicine.  You have blood in your urine for more than 2 weeks.  You have cloudy or bad-smelling urine.  You become constipated. Signs of constipation may include having: ? Fewer than three bowel movements in a week. ? Difficulty having a bowel movement. ? Stools that are dry, hard, or larger than normal.  You have a fever. Get help right away if:  You have: ? Severe pain. ? Bright-red blood in your urine. ? Blood clots in your urine. ? A lot of blood in your urine.  Your catheter has been removed and you are not able to urinate.  You have a catheter in place and the catheter is not draining urine. This information is not intended to replace advice given to you by your health care provider. Make sure you discuss any questions you have with your health care provider. Document Released: 11/02/2015 Document Revised: 07/24/2016 Document Reviewed: 08/13/2015 Elsevier Interactive Patient Education  2018 [employer]. Cystoscopy, Care After Refer to this sheet in  the next few weeks. These instructions provide you with information about caring for yourself after your procedure. Your health care provider may also give you more specific instructions. Your treatment has been planned according to current medical practices, but problems sometimes occur. Call your health care provider if you have any problems or questions after your procedure. What can I expect after  the procedure? After the procedure, it is common to have:  Mild pain when you urinate. Pain should stop within a few minutes after you urinate. This may last for up to 1 week.  A small amount of blood in your urine for several days.  Feeling like you need to urinate but producing only a small amount of urine.  Follow these instructions at home:  Medicines  Take over-the-counter and prescription medicines only as told by your health care provider.  If you were prescribed an antibiotic medicine, take it as told by your health care provider. Do not stop taking the antibiotic even if you start to feel better. General instructions   Return to your normal activities as told by your health care provider. Ask your health care provider what activities are safe for you.  Do not drive for 24 hours if you received a sedative.  Watch for any blood in your urine. If the amount of blood in your urine increases, call your health care provider.  Follow instructions from your health care provider about eating or drinking restrictions.  If a tissue sample was removed for testing (biopsy) during your procedure, it is your responsibility to get your test results. Ask your health care provider or the department performing the test when your results will be ready.  Drink enough fluid to keep your urine clear or pale yellow.  Keep all follow-up visits as told by your health care provider. This is important. Contact a health care provider if:  You have pain that gets worse or does not get better with medicine, especially pain when you urinate.  You have difficulty urinating. Get help right away if:  You have more blood in your urine.  You have blood clots in your urine.  You have abdominal pain.  You have a fever or chills.  You are unable to urinate. This information is not intended to replace advice given to you by your health care provider. Make sure you discuss any questions you have with  your health care provider. Document Released: 06/10/2005 Document Revised: 04/28/2016 Document Reviewed: 10/08/2015 Elsevier Interactive Patient Education  [employer].

## 2018-04-11 NOTE — Transfer of Care (Signed)
Immediate Anesthesia Transfer of Care Note  Patient: David Gutierrez  Procedure(s) Performed: TRANSURETHRAL RESECTION OF THE PROSTATE (TURP) (N/A )  Patient Location: PACU  Anesthesia Type:General  Level of Consciousness: drowsy and patient cooperative  Airway & Oxygen Therapy: Patient Spontanous Breathing and Patient connected to face mask oxygen  Post-op Assessment: Report given to RN and Post -op Vital signs reviewed and stable  Post vital signs: Reviewed and stable  Last Vitals:  Vitals Value Taken Time  BP 119/63 04/11/2018  1:40 PM  Temp    Pulse 70 04/11/2018  1:42 PM  Resp 15 04/11/2018  1:42 PM  SpO2 100 % 04/11/2018  1:42 PM  Vitals shown include unvalidated device data.  Last Pain:  Vitals:   04/11/18 1043  TempSrc:   PainSc: 0-No pain         Complications: No apparent anesthesia complications

## 2018-04-11 NOTE — Anesthesia Postprocedure Evaluation (Signed)
Anesthesia Post Note  Patient: David Gutierrez  Procedure(s) Performed: TRANSURETHRAL RESECTION OF THE PROSTATE (TURP) (N/A )     Patient location during evaluation: PACU Anesthesia Type: General Level of consciousness: awake and alert Pain management: pain level controlled Vital Signs Assessment: post-procedure vital signs reviewed and stable Respiratory status: spontaneous breathing, nonlabored ventilation, respiratory function stable and patient connected to nasal cannula oxygen Cardiovascular status: blood pressure returned to baseline and stable Postop Assessment: no apparent nausea or vomiting Anesthetic complications: no    Last Vitals:  Vitals:   04/11/18 1430 04/11/18 1445  BP: 131/72 127/70  Pulse: 66 67  Resp:    Temp: (!) 36.4 C   SpO2: 97% 98%    Last Pain:  Vitals:   04/11/18 1445  TempSrc:   PainSc: 0-No pain                 Barnet Glasgow

## 2018-04-11 NOTE — Anesthesia Preprocedure Evaluation (Signed)
Anesthesia Evaluation  Patient identified by MRN, date of birth, ID band Patient awake    Reviewed: Allergy & Precautions, NPO status , Patient's Chart, lab work & pertinent test results  Airway Mallampati: II  TM Distance: >3 FB Neck ROM: Full    Dental no notable dental hx.    Pulmonary neg pulmonary ROS, former smoker,    Pulmonary exam normal breath sounds clear to auscultation       Cardiovascular Exercise Tolerance: Good negative cardio ROS Normal cardiovascular exam Rhythm:Regular Rate:Normal     Neuro/Psych negative neurological ROS  negative psych ROS   GI/Hepatic Neg liver ROS,   Endo/Other  negative endocrine ROSHypothyroidism   Renal/GU negative Renal ROS  negative genitourinary   Musculoskeletal   Abdominal   Peds negative pediatric ROS (+)  Hematology negative hematology ROS (+)   Anesthesia Other Findings   Reproductive/Obstetrics negative OB ROS                            Anesthesia Physical Anesthesia Plan  ASA: III  Anesthesia Plan: General   Post-op Pain Management:    Induction: Intravenous  PONV Risk Score and Plan: Treatment may vary due to age or medical condition  Airway Management Planned: LMA  Additional Equipment:   Intra-op Plan:   Post-operative Plan: Extubation in OR  Informed Consent: I have reviewed the patients History and Physical, chart, labs and discussed the procedure including the risks, benefits and alternatives for the proposed anesthesia with the patient or authorized representative who has indicated his/her understanding and acceptance.     Plan Discussed with: CRNA  Anesthesia Plan Comments:         Anesthesia Quick Evaluation

## 2018-04-11 NOTE — Op Note (Signed)
Operative Note  Preoperative diagnosis:  1.  Bladder neck contracture 2.  History of BPH with incomplete emptying 3.  Neurogenic bladder  Postoperative diagnosis: 1.  Bladder neck contracture 2.  History of BPH with incomplete emptying 3.  Neurogenic bladder  Procedure(s): 1.  Cystoscopy with bipolar TURP  Surgeon: Ellison Hughs, MD  Assistants: None  Anesthesia: General  Complications: None  EBL: 10 mL  Specimens: 1.  Prostate chips  Drains/Catheters: 1.  22 French three-way Foley catheter with 30 mL in the balloon  Intraoperative findings:   1. Bladder neck contracture that the 26 French resectoscope could bypass. 2. The prostatic urethra was widely patent and hemostatic following resection  Indication:  David Gutierrez is a 84 y.o. male with a history of BPH, incomplete emptying, recurrent urinary tract infections and a neurogenic bladder.  He requires clean intermittent catheterization 1-3 times per day.  He underwent a TURP on in January 2019 and did very well following that procedure.  However, approximately 1 month ago, the patient had to start catheterizing himself more frequently and eventually was unable to perform CIC at all.  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  He had a Foley catheter placed at that time.  He is been consented for the above procedures, voices understanding and wishes to proceed.  Description of procedure:  After informed consent was obtained, the patient was brought to the operating room and general LMA anesthesia was administered. The patient was then placed in the dorsolithotomy position and prepped and draped in usual sterile fashion. A timeout was performed. A 23 French rigid cystoscope was then inserted into the urethral meatus and advanced into the bladder under direct vision. A complete bladder survey revealed no intravesical pathology.  Both ureteral orifices were identified and  well away from the bladder neck.  The rigid cystoscope was then exchanged for a 26 French resectoscope with a bipolar loop working element.  Starting at the bladder neck and progressing distally to the verumontanum, the prostatic adenoma was systematically resected until a widely patent prostatic urethral channel was created.  All prostate chips were then hand irrigated out of the bladder and sent to pathology for permanent section.  The resectoscope was then removed and exchanged for a 22 French three-way Foley catheter.  The three-way Foley catheter was then extensively hand irrigated until the irrigant returned clear.  The catheter was then placed to continuous bladder irrigation and placed on rubber band traction.  He tolerated the procedure well and was transferred to the postanesthesia unit in stable condition.  Plan: Home with Foley catheter.  We will plan on follow-up in the office on 04/16/2018 for a voiding trial.  Following his voiding trial, the patient will need to perform clean intermittent catheterization at least 2 times a day for the foreseeable future

## 2018-04-11 NOTE — Anesthesia Procedure Notes (Signed)
Procedure Name: LMA Insertion Date/Time: 04/11/2018 12:41 PM Performed by: Montel Clock, CRNA Pre-anesthesia Checklist: Patient identified, Emergency Drugs available, Suction available, Patient being monitored and Timeout performed Patient Re-evaluated:Patient Re-evaluated prior to induction Oxygen Delivery Method: Circle system utilized Preoxygenation: Pre-oxygenation with 100% oxygen Induction Type: IV induction LMA: LMA with gastric port inserted LMA Size: 4.0 Number of attempts: 1 Dental Injury: Teeth and Oropharynx as per pre-operative assessment

## 2018-04-13 DIAGNOSIS — N3000 Acute cystitis without hematuria: Secondary | ICD-10-CM | POA: Diagnosis not present

## 2018-04-14 ENCOUNTER — Other Ambulatory Visit: Payer: Self-pay | Admitting: Internal Medicine

## 2018-04-14 DIAGNOSIS — I2510 Atherosclerotic heart disease of native coronary artery without angina pectoris: Secondary | ICD-10-CM

## 2018-04-14 DIAGNOSIS — I428 Other cardiomyopathies: Secondary | ICD-10-CM

## 2018-04-16 NOTE — Telephone Encounter (Signed)
Refill Request.  

## 2018-04-26 DIAGNOSIS — N139 Obstructive and reflux uropathy, unspecified: Secondary | ICD-10-CM | POA: Diagnosis not present

## 2018-04-26 DIAGNOSIS — N312 Flaccid neuropathic bladder, not elsewhere classified: Secondary | ICD-10-CM | POA: Diagnosis not present

## 2018-05-08 DIAGNOSIS — N312 Flaccid neuropathic bladder, not elsewhere classified: Secondary | ICD-10-CM | POA: Diagnosis not present

## 2018-05-08 DIAGNOSIS — Z961 Presence of intraocular lens: Secondary | ICD-10-CM | POA: Diagnosis not present

## 2018-05-17 DIAGNOSIS — R3914 Feeling of incomplete bladder emptying: Secondary | ICD-10-CM | POA: Diagnosis not present

## 2018-06-13 DIAGNOSIS — N312 Flaccid neuropathic bladder, not elsewhere classified: Secondary | ICD-10-CM | POA: Diagnosis not present

## 2018-06-13 DIAGNOSIS — N320 Bladder-neck obstruction: Secondary | ICD-10-CM | POA: Diagnosis not present

## 2018-06-22 DIAGNOSIS — R3914 Feeling of incomplete bladder emptying: Secondary | ICD-10-CM | POA: Diagnosis not present

## 2018-06-22 DIAGNOSIS — N3020 Other chronic cystitis without hematuria: Secondary | ICD-10-CM | POA: Diagnosis not present

## 2018-06-22 DIAGNOSIS — N401 Enlarged prostate with lower urinary tract symptoms: Secondary | ICD-10-CM | POA: Diagnosis not present

## 2018-06-25 DIAGNOSIS — N139 Obstructive and reflux uropathy, unspecified: Secondary | ICD-10-CM | POA: Diagnosis not present

## 2018-06-25 DIAGNOSIS — N312 Flaccid neuropathic bladder, not elsewhere classified: Secondary | ICD-10-CM | POA: Diagnosis not present

## 2018-06-26 DIAGNOSIS — H906 Mixed conductive and sensorineural hearing loss, bilateral: Secondary | ICD-10-CM | POA: Diagnosis not present

## 2018-06-26 DIAGNOSIS — H8001 Otosclerosis involving oval window, nonobliterative, right ear: Secondary | ICD-10-CM | POA: Diagnosis not present

## 2018-07-11 ENCOUNTER — Other Ambulatory Visit: Payer: Self-pay | Admitting: Internal Medicine

## 2018-07-11 DIAGNOSIS — R1310 Dysphagia, unspecified: Secondary | ICD-10-CM

## 2018-07-11 DIAGNOSIS — K2100 Gastro-esophageal reflux disease with esophagitis, without bleeding: Secondary | ICD-10-CM

## 2018-07-19 DIAGNOSIS — R3914 Feeling of incomplete bladder emptying: Secondary | ICD-10-CM | POA: Diagnosis not present

## 2018-07-19 DIAGNOSIS — N139 Obstructive and reflux uropathy, unspecified: Secondary | ICD-10-CM | POA: Diagnosis not present

## 2018-07-19 DIAGNOSIS — N312 Flaccid neuropathic bladder, not elsewhere classified: Secondary | ICD-10-CM | POA: Diagnosis not present

## 2018-07-25 IMAGING — US US ABDOMEN LIMITED
1 series · 14 of 25 positions shown · non-contrast
Comparison: None in PACs

CLINICAL DATA: Abdominal pain since TURP procedure on December 06, 2017

EXAM:
ULTRASOUND ABDOMEN LIMITED RIGHT UPPER QUADRANT

[Series 1: us abdomen limited · 0.23mm/px · 14 of 61 slices shown]
[im 1/61]
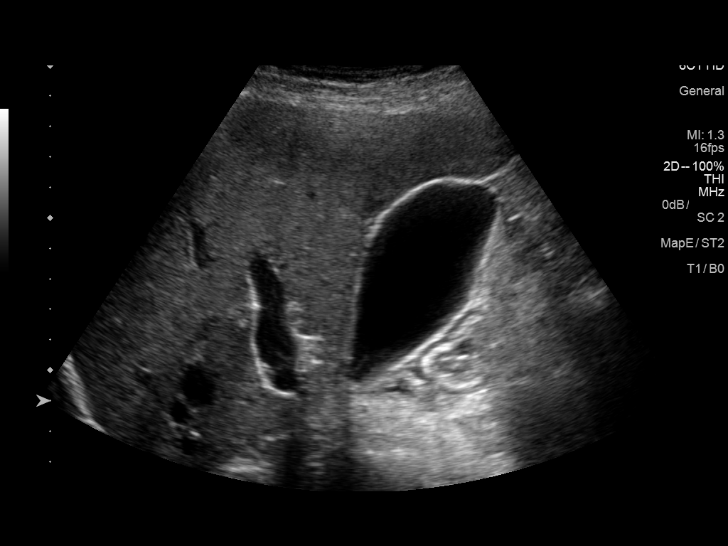
[im 6/61]
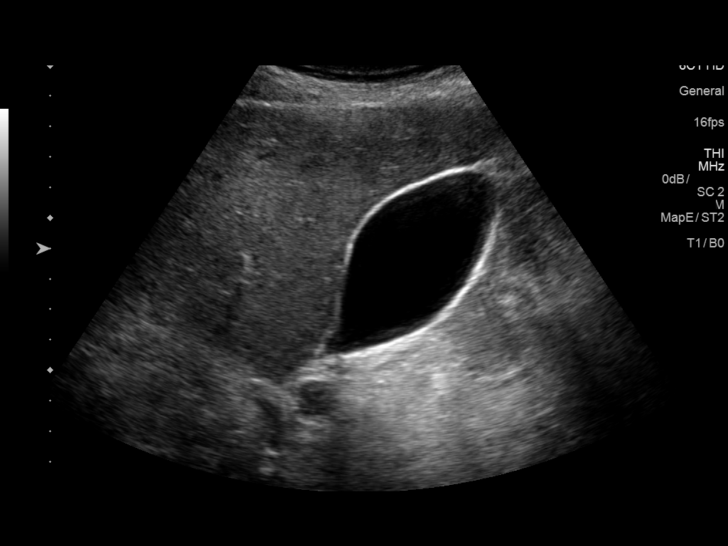
[im 11/61]
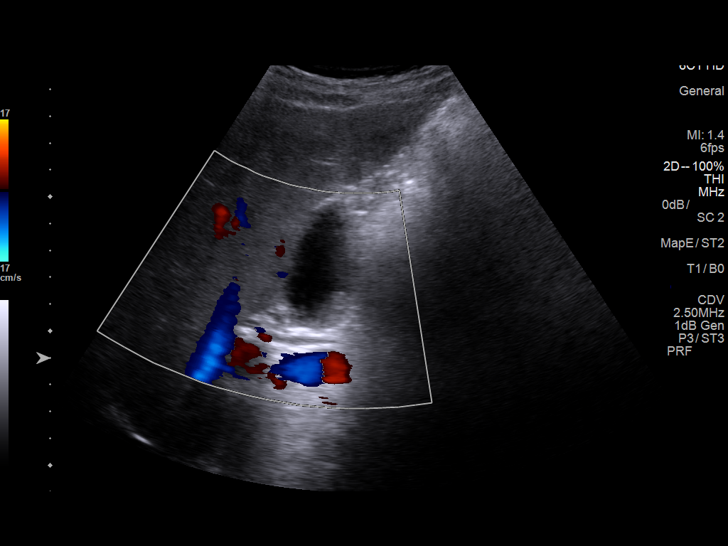
[im 16/61]
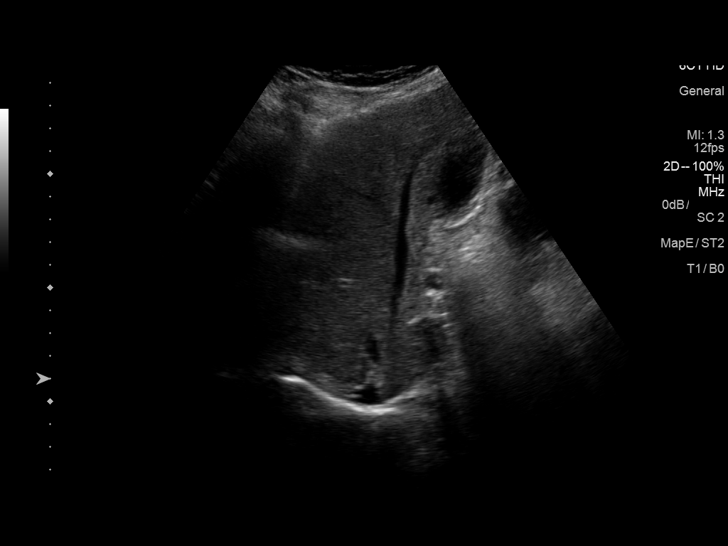
[im 21/61]
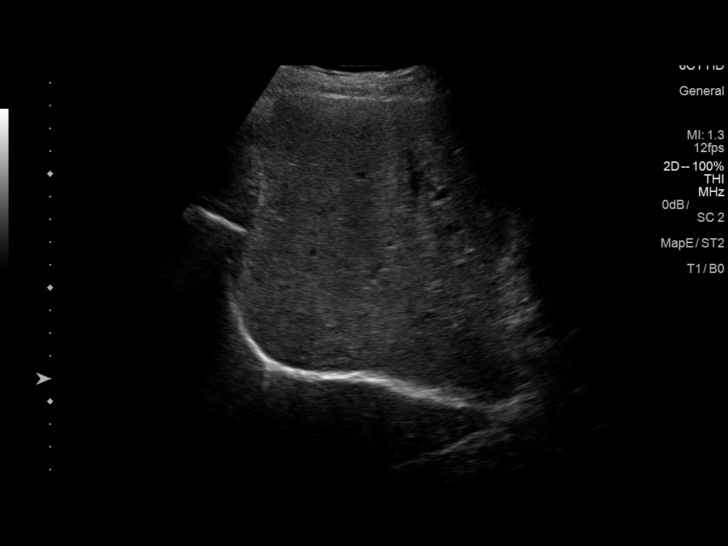
[im 23/61]
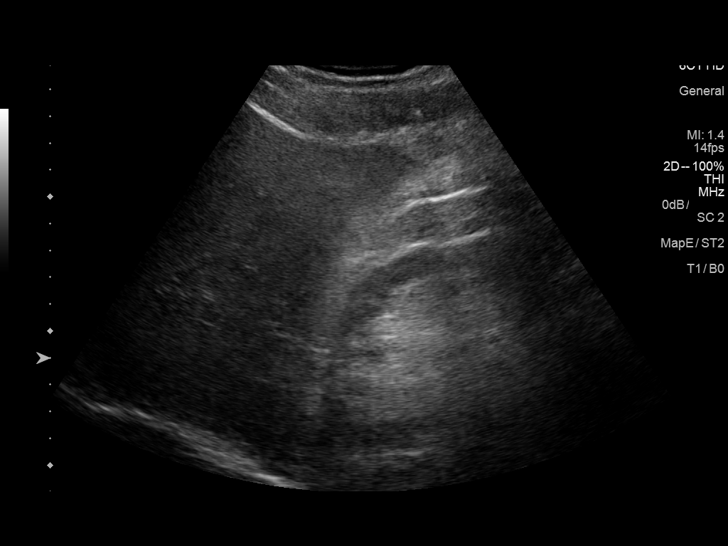
[im 28/61]
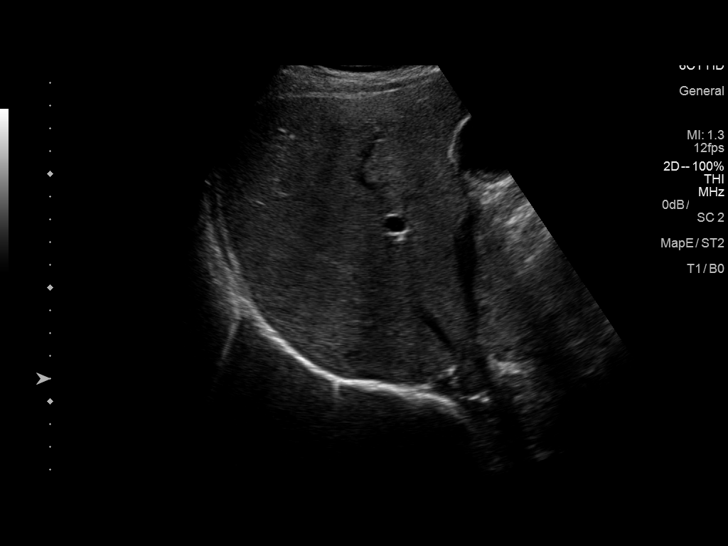
[im 33/61]
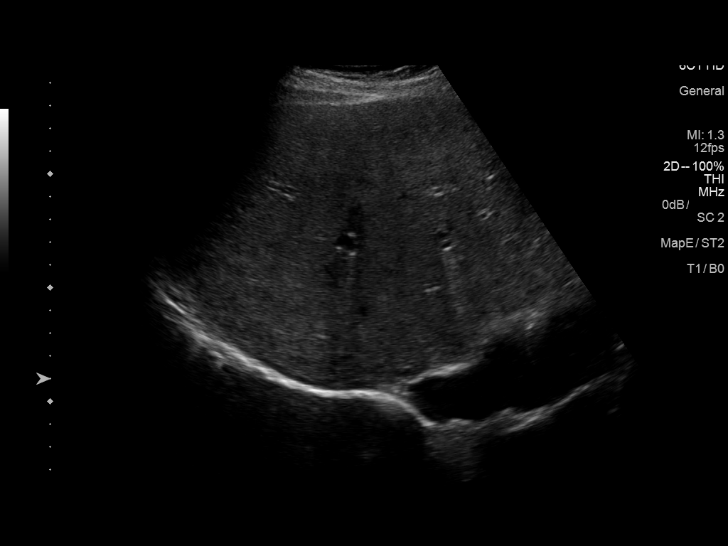
[im 38/61]
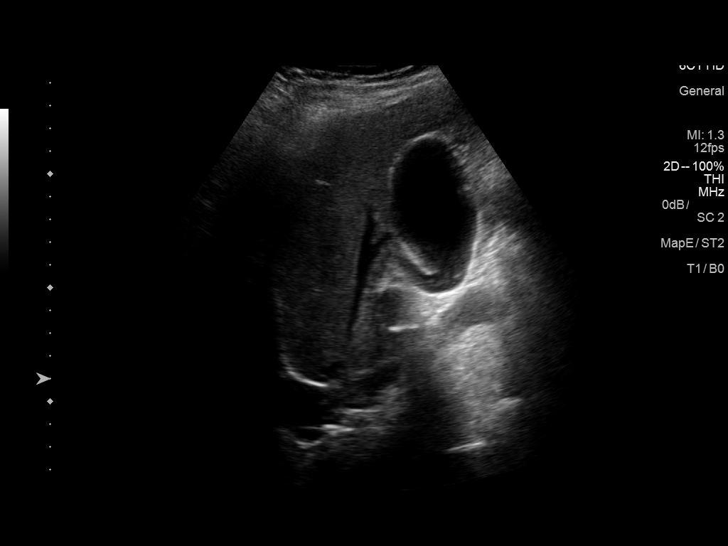
[im 41/61]
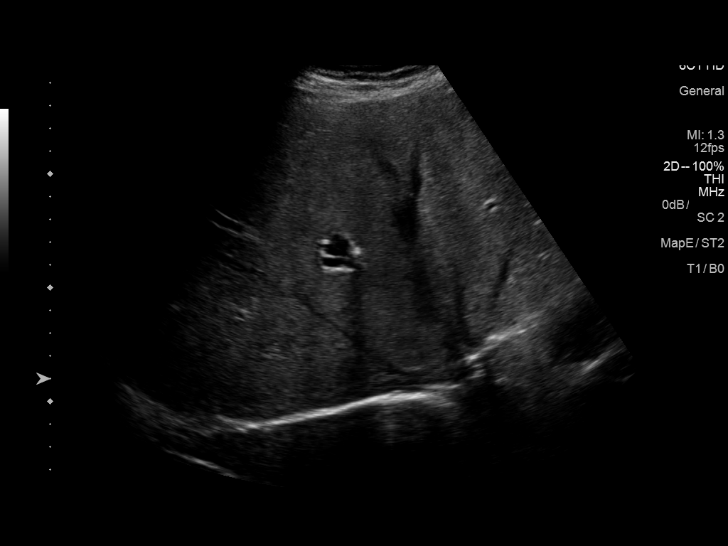
[im 46/61]
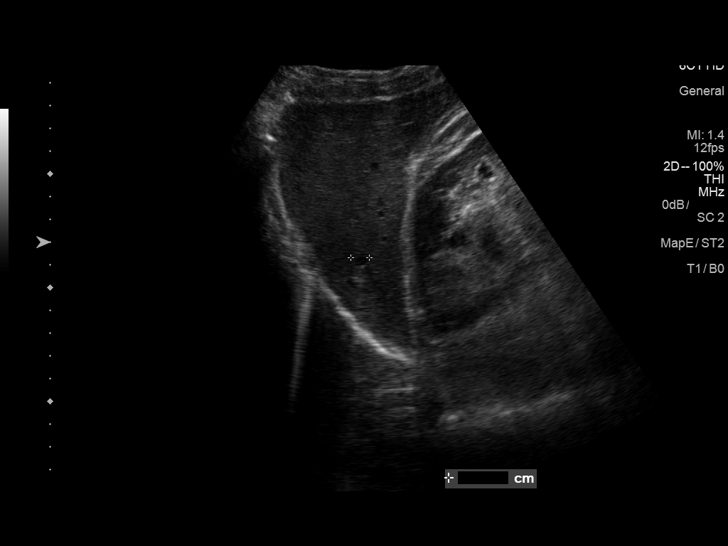
[im 51/61]
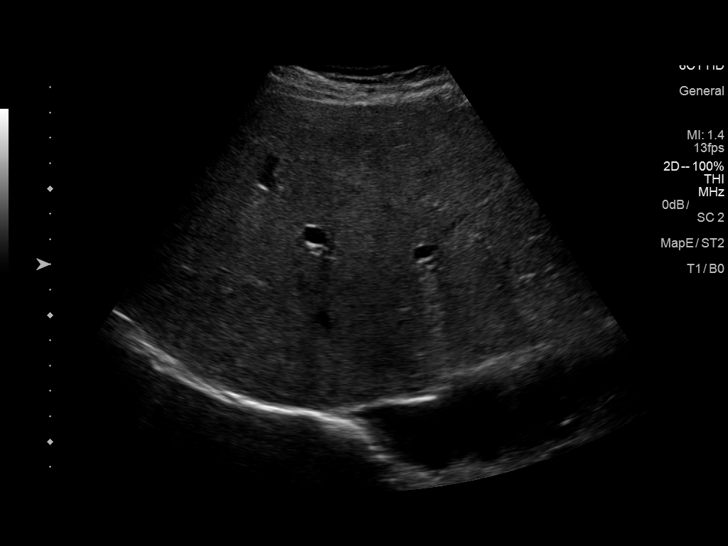
[im 56/61]
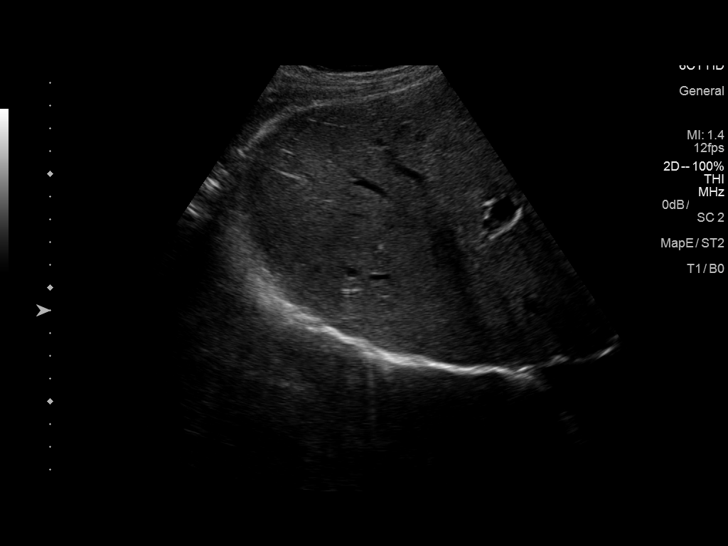
[im 61/61]
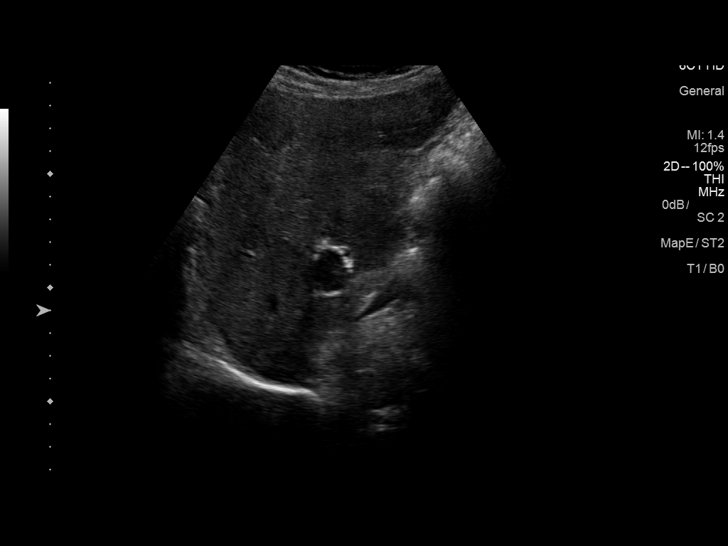

[14 of 25 positions shown; findings below may reference images not displayed]

FINDINGS: Gallbladder:

No gallstones or wall thickening visualized. No sonographic Murphy
sign noted by sonographer.

Common bile duct:

Diameter: 4 mm

Liver:

The hepatic echotexture is heterogeneous. The surface contour is
smooth. There is no focal mass or ductal dilation. There may be an 8
mm diameter cyst in the right lobe. portal vein is patent on color
Doppler imaging with normal direction of blood flow towards the
liver.

There is a simple appearing cystic structure measuring at least 2 cm
in diameter in the region of the pancreatic head.
IMPRESSION: Heterogeneous hepatic echotexture without definite solid masses.
Possible cyst in the pancreatic head. Abdominal MRI or CT scanning
is recommended.

Normal appearance of the gallbladder and common bile duct.

## 2018-07-26 DIAGNOSIS — Z85828 Personal history of other malignant neoplasm of skin: Secondary | ICD-10-CM | POA: Diagnosis not present

## 2018-07-26 DIAGNOSIS — L821 Other seborrheic keratosis: Secondary | ICD-10-CM | POA: Diagnosis not present

## 2018-07-26 DIAGNOSIS — L570 Actinic keratosis: Secondary | ICD-10-CM | POA: Diagnosis not present

## 2018-07-26 DIAGNOSIS — L820 Inflamed seborrheic keratosis: Secondary | ICD-10-CM | POA: Diagnosis not present

## 2018-07-26 DIAGNOSIS — D485 Neoplasm of uncertain behavior of skin: Secondary | ICD-10-CM | POA: Diagnosis not present

## 2018-07-27 DIAGNOSIS — N401 Enlarged prostate with lower urinary tract symptoms: Secondary | ICD-10-CM | POA: Diagnosis not present

## 2018-07-27 DIAGNOSIS — N312 Flaccid neuropathic bladder, not elsewhere classified: Secondary | ICD-10-CM | POA: Diagnosis not present

## 2018-07-27 DIAGNOSIS — R351 Nocturia: Secondary | ICD-10-CM | POA: Diagnosis not present

## 2018-07-27 DIAGNOSIS — N320 Bladder-neck obstruction: Secondary | ICD-10-CM | POA: Diagnosis not present

## 2018-07-27 DIAGNOSIS — N3021 Other chronic cystitis with hematuria: Secondary | ICD-10-CM | POA: Diagnosis not present

## 2018-08-02 DIAGNOSIS — N3021 Other chronic cystitis with hematuria: Secondary | ICD-10-CM | POA: Diagnosis not present

## 2018-08-28 DIAGNOSIS — N139 Obstructive and reflux uropathy, unspecified: Secondary | ICD-10-CM | POA: Diagnosis not present

## 2018-08-28 DIAGNOSIS — N312 Flaccid neuropathic bladder, not elsewhere classified: Secondary | ICD-10-CM | POA: Diagnosis not present

## 2018-09-14 DIAGNOSIS — Z6828 Body mass index (BMI) 28.0-28.9, adult: Secondary | ICD-10-CM | POA: Diagnosis not present

## 2018-09-14 DIAGNOSIS — G6289 Other specified polyneuropathies: Secondary | ICD-10-CM | POA: Diagnosis not present

## 2018-09-14 DIAGNOSIS — K862 Cyst of pancreas: Secondary | ICD-10-CM | POA: Diagnosis not present

## 2018-09-17 ENCOUNTER — Other Ambulatory Visit: Payer: Self-pay | Admitting: Internal Medicine

## 2018-09-17 DIAGNOSIS — K862 Cyst of pancreas: Secondary | ICD-10-CM

## 2018-09-18 DIAGNOSIS — D1801 Hemangioma of skin and subcutaneous tissue: Secondary | ICD-10-CM | POA: Diagnosis not present

## 2018-09-18 DIAGNOSIS — L821 Other seborrheic keratosis: Secondary | ICD-10-CM | POA: Diagnosis not present

## 2018-09-18 DIAGNOSIS — Z85828 Personal history of other malignant neoplasm of skin: Secondary | ICD-10-CM | POA: Diagnosis not present

## 2018-09-18 DIAGNOSIS — L812 Freckles: Secondary | ICD-10-CM | POA: Diagnosis not present

## 2018-09-18 DIAGNOSIS — L570 Actinic keratosis: Secondary | ICD-10-CM | POA: Diagnosis not present

## 2018-09-21 ENCOUNTER — Ambulatory Visit
Admission: RE | Admit: 2018-09-21 | Discharge: 2018-09-21 | Disposition: A | Discharge status: Home / Self Care | Payer: PPO | Source: Ambulatory Visit | Attending: Internal Medicine | Admitting: Internal Medicine

## 2018-09-21 DIAGNOSIS — K862 Cyst of pancreas: Secondary | ICD-10-CM | POA: Diagnosis not present

## 2018-09-21 DIAGNOSIS — N281 Cyst of kidney, acquired: Secondary | ICD-10-CM | POA: Diagnosis not present

## 2018-09-21 MED ORDER — IOPAMIDOL (ISOVUE-300) INJECTION 61%
100.0000 mL | Freq: Once | INTRAVENOUS | Status: AC | PRN
  Administered 2018-09-21: 100 mL via INTRAVENOUS

## 2018-09-27 DIAGNOSIS — N312 Flaccid neuropathic bladder, not elsewhere classified: Secondary | ICD-10-CM | POA: Diagnosis not present

## 2018-09-27 DIAGNOSIS — N139 Obstructive and reflux uropathy, unspecified: Secondary | ICD-10-CM | POA: Diagnosis not present

## 2018-10-04 ENCOUNTER — Encounter: Payer: Self-pay | Admitting: Internal Medicine

## 2018-10-26 DIAGNOSIS — N139 Obstructive and reflux uropathy, unspecified: Secondary | ICD-10-CM | POA: Diagnosis not present

## 2018-10-26 DIAGNOSIS — N312 Flaccid neuropathic bladder, not elsewhere classified: Secondary | ICD-10-CM | POA: Diagnosis not present

## 2018-10-28 NOTE — Progress Notes (Signed)
Cardiology Office Note   Date:  10/29/2018   ID:  Derk, Doubek 1933/08/19, MRN [id number]  PCP:  Leanna Battles, MD  Cardiologist:   Dorris Carnes, MD   F/U of CHF     History of Present Illness:  HPI:  Mr. Marzella is a 84 y.o. year-old male with history of nonischemic cardiomyopathy with chronic LBBB and nonobstructive CAD, who presents for follow-up of cardiomyopathy. I last saw Mr. Mirabile in 05/2017, at which time he reported doing well with the exception of long-standing neuropathy in both legs. This had improved a little bit after alcohol cessation.  Today, Mr. Conwell reports rate feeling well from a heart standpoint.  He is recovering from transurethral resection of the prostate earlier this month.  He still has some urinary retention and intermittent hematuria.  He reports one episode of epigastric discomfort that occurred in the setting of urinary retention.  He denies chest pain, shortness of breath, palpitations, and lightheadedness.  He is tolerating his medications well.  He recently held rosuvastatin, thinking that it may have been making it more difficult for him to urinate.  In light of his prostate issues, he now plans to restart rosuvastatin.  Lower extremity neuropathy is not significantly changed.  Mr. Hunnell remains abstinent from alcohol  The pt was sen by C End in Jan 2019  Since seen in Jan, he denies CP   Breathing is OK   Denies LE edema   Denies dizziness   Activity is limited with neuropathy    Current Meds  Medication Sig  . aspirin EC 81 MG tablet Take 81 mg by mouth daily.  . AZO-CRANBERRY PO Take 2 tablets by mouth 2 (two) times daily.  Marland Kitchen b complex vitamins capsule Take 1 capsule by mouth daily.  . carvedilol (COREG) 6.25 MG tablet TAKE ONE TABLET TWICE DAILY  . diazepam (VALIUM) 5 MG tablet Take 2.5 mg by mouth 2 (two) times daily.   . diphenhydramine-acetaminophen (TYLENOL PM) 25-500 MG TABS tablet Take 1 tablet by mouth at bedtime.   Marland Kitchen levothyroxine  (LEVOTHROID) 25 MCG tablet Take 25 mcg by mouth every evening.   Marland Kitchen omeprazole (PRILOSEC) 20 MG capsule Take 20 mg by mouth daily.  . polyethylene glycol (MIRALAX / GLYCOLAX) packet Take 17 g by mouth daily as needed for mild constipation.  . Probiotic Product (PROBIOTIC PO) Take 1 capsule by mouth daily.  . rosuvastatin (CRESTOR) 5 MG tablet Take 5 mg by mouth daily.   Marland Kitchen trimethoprim (TRIMPEX) 100 MG tablet Take 100 mg by mouth at bedtime.   Current Facility-Administered Medications for the 10/29/18 encounter (Office Visit) with Fay Records, MD  Medication  . 0.9 %  sodium chloride infusion     Allergies:   Cyclobenzaprine; Lisinopril; Relafen [nabumetone]; and Statins   Past Medical History:  Diagnosis Date  . Alcoholic peripheral neuropathy [facility])    neurologist-  dr patel--- feet numbness and sensory ataxia  . Arthritis   . B12 deficiency   . BPH (benign prostatic hyperplasia)   . CAD (coronary artery disease) cardiolgoist-  dr Harrell Gave end (previouly dr dalton [name])   a. Myoview 11/13:  EF 38%, inf and IS defect c/w scar but no ischemia:  b. Cardiac CT 11/13:  Ca score 318 Agatson units, pLAD and dCFX plaque;   c. LHC 11/07/12:  pLAD 30%, oD1 40%, oCFX 30%, dCFX 40-50%, mRCA 40%, EF 50% (frequent PVCs and short run of NSVT with injection/  per last echo 01/ 2017  ef 55-60%  . Chronic constipation   . Diverticulosis of colon   . Esophageal reflux   . External hemorrhoids   . First degree heart block   . Hiatal hernia   . History of adenomatous polyp of colon   . History of esophageal stricture 08/11/2015   s/p  dilatation  . History of lower GI bleeding 03/01/2017   s/p  flexiable simoidscopy w/ clipping rectum ulcer  . Hypogonadism male   . Hypothyroidism   . Interstitial cystitis   . Irritable bowel syndrome   . LBBB (left bundle branch block)   . NICM (nonischemic cardiomyopathy) [facility]) cardiologist-  dr Harrell Gave end (previously dr dalton [name])--  per last  echo 01/ 2017  ef 55-60%   ? 2/2 LBBB => a. echo 11/13: diff HK, worse in septum and apex, mod LVE, mild LVH, EF 40%, mild AI, mild MR, mod LAE  . Pre-diabetes   . Self-catheterizes urinary bladder    bid to tid   . Wears glasses   . Wears hearing aid in both ears     Past Surgical History:  Procedure Laterality Date  . CARDIAC CATHETERIZATION  11-07-2012    dr [name]   nonobstructive CAD (pLAD 30%, ostial D1 40%, ostial LCx 30%, dLCx diffuse 40-50%, mRCA 40%) ;  LVSF 50% but diffiult due to PVCs and short run VT with injection;  cardiomyopathy mostly likely a LBBB cardiomyopathy  . CARDIOVASCULAR STRESS TEST  10-16-2012   dr [name]   Low risk adenosine nuclear study (no exercise) w/ a fixed inferior and inferoseptal defect without ischemia (question as whether this abnormality due to LBBB cardiomyopathy or due to scar)/  LV ef 38%,  LV wall motion decreased of the septum and entire apex  . CATARACT EXTRACTION W/ INTRAOCULAR LENS  IMPLANT, BILATERAL  2012  approx.  . CYSTO/  HYDRODISTENTION/  BLADDER BIOPSY  04-20-2005    dr Lawerance Bach  [facility]  . ETHMOIDECTOMY Right 12/12/2016   Procedure: RIGHT ENDOSCOPIC ETHMOIDECTOMY;  Surgeon: [name], MD;  Location: [place];  Service: ENT;  Laterality: Right;  . FLEXIBLE SIGMOIDOSCOPY N/A 03/02/2017   Procedure: FLEXIBLE SIGMOIDOSCOPY;  Surgeon: Ladene Artist, MD;  Location: [facility];  Service: Endoscopy;  Laterality: N/A;  . Regino Ramirez  . MAXILLARY ANTROSTOMY Right 12/12/2016   Procedure: RIGHT ENDOSCOPIC MAXILLARY ANTROSTOMY;  Surgeon: [name], MD;  Location: [place];  Service: ENT;  Laterality: Right;  . SINUS ENDO W/FUSION Right 12/12/2016   Procedure: ENDOSCOPIC SINUS SURGERY WITH NAVIGATION;  Surgeon: [name], MD;  Location: [place];  Service: ENT;  Laterality: Right;  . SPHENOIDECTOMY Right 12/12/2016   Procedure: RIGHT ENDOSCOPIC SPHENOIDECTOMY;  Surgeon: [name], MD;  Location:  [place];  Service: ENT;  Laterality: Right;  . TONSILLECTOMY AND ADENOIDECTOMY  child  . TOTAL KNEE ARTHROPLASTY Left 10/14/2014   Procedure: LEFT TOTAL KNEE ARTHROPLASTY;  Surgeon: [name], MD;  Location: [facility];  Service: Orthopedics;  Laterality: Left;  . TOTAL KNEE ARTHROPLASTY Right 1990s  . TRANSTHORACIC ECHOCARDIOGRAM  12-24-2015   dr [name]   ef 55-60%,  grade 1 diastolic dysfunction/  trivial AR and TR  mild MR and PR/  moderate LAE  . TRANSURETHRAL RESECTION OF PROSTATE  06-11-2010   dr Gaynelle Arabian  [facility]   w/  GYRUS  . TRANSURETHRAL RESECTION OF PROSTATE N/A 12/06/2017   Procedure: TRANSURETHRAL RESECTION  OF THE PROSTATE (TURP)/ BIPOLAR;  Surgeon: Ceasar Mons, MD;  Location: [facility];  Service: Urology;  Laterality: N/A;  . TRANSURETHRAL RESECTION OF PROSTATE N/A 04/11/2018   Procedure: TRANSURETHRAL RESECTION OF THE PROSTATE (TURP);  Surgeon: Ceasar Mons, MD;  Location: [facility];  Service: Urology;  Laterality: N/A;     Social History:  The patient  reports that he quit smoking about 50 years ago. His smoking use included cigarettes. He has a 30.00 pack-year smoking history. He has never used smokeless tobacco. He reports that he drinks alcohol. He reports that he does not use drugs.   Family History:  The patient's family history includes Diabetes type I in his son; Healthy in his father, mother, sister, and sister; Heart disease in his brother; Other in his sister; Thyroid cancer in his brother.    ROS:  Please see the history of present illness. All other systems are reviewed and  Negative to the above problem except as noted.    PHYSICAL EXAM: VS:  BP 112/66   Pulse 80   Ht 6' (1.829 m)   Wt 213 lb 12.8 oz (97 kg)   SpO2 97%   BMI 29.00 kg/m   GEN: Overweight 84 yo , in no acute distress  HEENT: normal  Neck: JVP is normal  No, carotid bruits, or masses Cardiac: RRR; no murmurs, rubs, or gallops,no edema   Respiratory:  clear to auscultation bilaterally, normal work of breathing GI: soft, nontender, nondistended, + BS  No hepatomegaly  MS: no deformity Moving all extremities   Skin: warm and dry, no rash Neuro:  Strength and sensation are intact Psych: euthymic mood, full affect   EKG:  EKG is not ordered today.   Lipid Panel    Component Value Date/Time   CHOL 145 03/24/2016 0735   TRIG 92 03/24/2016 0735   HDL 46 03/24/2016 0735   CHOLHDL 3.2 03/24/2016 0735   VLDL 18 03/24/2016 0735   LDLCALC 81 03/24/2016 0735      Wt Readings from Last 3 Encounters:  10/29/18 213 lb 12.8 oz (97 kg)  04/11/18 200 lb (90.7 kg)  04/10/18 200 lb 4 oz (90.8 kg)      ASSESSMENT AND PLAN:  1  NICM   Volume status appears OK  Echo in 2017 LVEF normalized     2  CAD   Mild to mod at cath in 2013   No symptoms to sugg angina  3   HL  Lipids were good in 2017 LDL 81   HDL 46   Will need to be followed     Encouraged him t ostay active   Will f/u in 9 months   Current medicines are reviewed at length with the patient today.  The patient does not have concerns regarding medicines.  Signed, Dorris Carnes, MD  10/29/2018 8:49 AM    [address], [place], [place]  [id number] Phone: [phone]; Fax: [phone]

## 2018-10-29 ENCOUNTER — Ambulatory Visit: Payer: PPO | Admitting: Internal Medicine

## 2018-10-29 ENCOUNTER — Encounter: Payer: Self-pay | Admitting: Internal Medicine

## 2018-10-29 VITALS — BP 112/66 | HR 80 | Ht 72.0 in | Wt 213.8 lb

## 2018-10-29 DIAGNOSIS — E782 Mixed hyperlipidemia: Secondary | ICD-10-CM

## 2018-10-29 DIAGNOSIS — I2510 Atherosclerotic heart disease of native coronary artery without angina pectoris: Secondary | ICD-10-CM | POA: Diagnosis not present

## 2018-10-29 DIAGNOSIS — I428 Other cardiomyopathies: Secondary | ICD-10-CM

## 2018-10-29 NOTE — Patient Instructions (Signed)
Medication Instructions:  No changes  If you need a refill on your cardiac medications before your next appointment, please call your pharmacy.   Lab work: None  If you have labs (blood work) drawn today and your tests are completely normal, you will receive your results only by: Marland Kitchen MyChart Message (if you have MyChart) OR . A paper copy in the mail If you have any lab test that is abnormal or we need to change your treatment, we will call you to review the results.  Testing/Procedures: none  Follow-Up: At [facility], you and your health needs are our priority.  As part of our continuing mission to provide you with exceptional heart care, we have created designated Provider Care Teams.  These Care Teams include your primary Cardiologist (physician) and Advanced Practice Providers (APPs -  Physician Assistants and Nurse Practitioners) who all work together to provide you with the care you need, when you need it. You will need a follow up appointment in:  9 months.  Please call our office 2 months in advance to schedule this appointment.  You may see Dorris Carnes, MD or one of the following Advanced Practice Providers on your designated Care Team: Richardson Dopp, PA-C [place], [place] . Daune Perch, NP  Any Other Special Instructions Will Be Listed Below (If Applicable).

## 2018-11-15 DIAGNOSIS — R3914 Feeling of incomplete bladder emptying: Secondary | ICD-10-CM | POA: Diagnosis not present

## 2018-11-15 DIAGNOSIS — N320 Bladder-neck obstruction: Secondary | ICD-10-CM | POA: Diagnosis not present

## 2018-11-15 DIAGNOSIS — N3021 Other chronic cystitis with hematuria: Secondary | ICD-10-CM | POA: Diagnosis not present

## 2018-11-26 DIAGNOSIS — N139 Obstructive and reflux uropathy, unspecified: Secondary | ICD-10-CM | POA: Diagnosis not present

## 2018-11-26 DIAGNOSIS — N312 Flaccid neuropathic bladder, not elsewhere classified: Secondary | ICD-10-CM | POA: Diagnosis not present

## 2018-12-18 DIAGNOSIS — N139 Obstructive and reflux uropathy, unspecified: Secondary | ICD-10-CM | POA: Diagnosis not present

## 2018-12-18 DIAGNOSIS — N312 Flaccid neuropathic bladder, not elsewhere classified: Secondary | ICD-10-CM | POA: Diagnosis not present

## 2018-12-31 ENCOUNTER — Other Ambulatory Visit: Payer: Self-pay | Admitting: Internal Medicine

## 2018-12-31 DIAGNOSIS — N312 Flaccid neuropathic bladder, not elsewhere classified: Secondary | ICD-10-CM | POA: Diagnosis not present

## 2018-12-31 DIAGNOSIS — N3020 Other chronic cystitis without hematuria: Secondary | ICD-10-CM | POA: Diagnosis not present

## 2018-12-31 NOTE — Telephone Encounter (Signed)
Please review for refill.  

## 2019-01-17 ENCOUNTER — Other Ambulatory Visit: Payer: Self-pay | Admitting: *Deleted

## 2019-01-17 ENCOUNTER — Telehealth: Payer: Self-pay | Admitting: Internal Medicine

## 2019-01-17 DIAGNOSIS — N312 Flaccid neuropathic bladder, not elsewhere classified: Secondary | ICD-10-CM | POA: Diagnosis not present

## 2019-01-17 DIAGNOSIS — R3914 Feeling of incomplete bladder emptying: Secondary | ICD-10-CM | POA: Diagnosis not present

## 2019-01-17 DIAGNOSIS — I2510 Atherosclerotic heart disease of native coronary artery without angina pectoris: Secondary | ICD-10-CM

## 2019-01-17 DIAGNOSIS — I428 Other cardiomyopathies: Secondary | ICD-10-CM

## 2019-01-17 DIAGNOSIS — N139 Obstructive and reflux uropathy, unspecified: Secondary | ICD-10-CM | POA: Diagnosis not present

## 2019-01-17 MED ORDER — CARVEDILOL 6.25 MG PO TABS: 6.2500 mg | ORAL_TABLET | Freq: Two times a day (BID) | ORAL | 2 refills | Status: DC

## 2019-01-17 MED ORDER — OMEPRAZOLE 20 MG PO CPDR: 20.0000 mg | DELAYED_RELEASE_CAPSULE | Freq: Every day | ORAL | 3 refills | Status: DC

## 2019-01-17 NOTE — Telephone Encounter (Signed)
Pt requested omeprazole refill sent to [facility] on [address].

## 2019-01-17 NOTE — Telephone Encounter (Signed)
Omeprazole refilled.

## 2019-02-06 DIAGNOSIS — N312 Flaccid neuropathic bladder, not elsewhere classified: Secondary | ICD-10-CM | POA: Diagnosis not present

## 2019-02-06 DIAGNOSIS — N139 Obstructive and reflux uropathy, unspecified: Secondary | ICD-10-CM | POA: Diagnosis not present

## 2019-02-06 DIAGNOSIS — R3914 Feeling of incomplete bladder emptying: Secondary | ICD-10-CM | POA: Diagnosis not present

## 2019-02-12 DIAGNOSIS — R3914 Feeling of incomplete bladder emptying: Secondary | ICD-10-CM | POA: Diagnosis not present

## 2019-02-12 DIAGNOSIS — N139 Obstructive and reflux uropathy, unspecified: Secondary | ICD-10-CM | POA: Diagnosis not present

## 2019-02-12 DIAGNOSIS — N312 Flaccid neuropathic bladder, not elsewhere classified: Secondary | ICD-10-CM | POA: Diagnosis not present

## 2019-03-06 DIAGNOSIS — L980 Pyogenic granuloma: Secondary | ICD-10-CM | POA: Diagnosis not present

## 2019-03-06 DIAGNOSIS — N312 Flaccid neuropathic bladder, not elsewhere classified: Secondary | ICD-10-CM | POA: Diagnosis not present

## 2019-03-06 DIAGNOSIS — N139 Obstructive and reflux uropathy, unspecified: Secondary | ICD-10-CM | POA: Diagnosis not present

## 2019-03-06 DIAGNOSIS — R229 Localized swelling, mass and lump, unspecified: Secondary | ICD-10-CM | POA: Diagnosis not present

## 2019-03-07 DIAGNOSIS — N189 Chronic kidney disease, unspecified: Secondary | ICD-10-CM | POA: Diagnosis not present

## 2019-03-07 DIAGNOSIS — F39 Unspecified mood [affective] disorder: Secondary | ICD-10-CM | POA: Diagnosis not present

## 2019-03-07 DIAGNOSIS — I714 Abdominal aortic aneurysm, without rupture: Secondary | ICD-10-CM | POA: Diagnosis not present

## 2019-03-07 DIAGNOSIS — I781 Nevus, non-neoplastic: Secondary | ICD-10-CM | POA: Diagnosis not present

## 2019-03-07 DIAGNOSIS — H04123 Dry eye syndrome of bilateral lacrimal glands: Secondary | ICD-10-CM | POA: Diagnosis not present

## 2019-03-07 DIAGNOSIS — E7849 Other hyperlipidemia: Secondary | ICD-10-CM | POA: Diagnosis not present

## 2019-03-07 DIAGNOSIS — Z23 Encounter for immunization: Secondary | ICD-10-CM | POA: Diagnosis not present

## 2019-03-07 DIAGNOSIS — L02224 Furuncle of groin: Secondary | ICD-10-CM | POA: Diagnosis not present

## 2019-03-07 DIAGNOSIS — I10 Essential (primary) hypertension: Secondary | ICD-10-CM | POA: Diagnosis not present

## 2019-03-07 DIAGNOSIS — L02421 Furuncle of right axilla: Secondary | ICD-10-CM | POA: Diagnosis not present

## 2019-03-07 DIAGNOSIS — R600 Localized edema: Secondary | ICD-10-CM | POA: Diagnosis not present

## 2019-03-07 DIAGNOSIS — Z125 Encounter for screening for malignant neoplasm of prostate: Secondary | ICD-10-CM | POA: Diagnosis not present

## 2019-03-14 DIAGNOSIS — N319 Neuromuscular dysfunction of bladder, unspecified: Secondary | ICD-10-CM | POA: Diagnosis not present

## 2019-03-14 DIAGNOSIS — Z1331 Encounter for screening for depression: Secondary | ICD-10-CM | POA: Diagnosis not present

## 2019-03-14 DIAGNOSIS — Z0000 Encounter for general adult medical examination without abnormal findings: Secondary | ICD-10-CM | POA: Diagnosis not present

## 2019-03-14 DIAGNOSIS — D696 Thrombocytopenia, unspecified: Secondary | ICD-10-CM | POA: Diagnosis not present

## 2019-03-14 DIAGNOSIS — I2510 Atherosclerotic heart disease of native coronary artery without angina pectoris: Secondary | ICD-10-CM | POA: Diagnosis not present

## 2019-03-14 DIAGNOSIS — K862 Cyst of pancreas: Secondary | ICD-10-CM | POA: Diagnosis not present

## 2019-03-14 DIAGNOSIS — G629 Polyneuropathy, unspecified: Secondary | ICD-10-CM | POA: Diagnosis not present

## 2019-03-14 DIAGNOSIS — I10 Essential (primary) hypertension: Secondary | ICD-10-CM | POA: Diagnosis not present

## 2019-03-14 DIAGNOSIS — Z1339 Encounter for screening examination for other mental health and behavioral disorders: Secondary | ICD-10-CM | POA: Diagnosis not present

## 2019-03-14 DIAGNOSIS — E785 Hyperlipidemia, unspecified: Secondary | ICD-10-CM | POA: Diagnosis not present

## 2019-03-15 ENCOUNTER — Other Ambulatory Visit: Payer: Self-pay | Admitting: Internal Medicine

## 2019-03-15 DIAGNOSIS — K862 Cyst of pancreas: Secondary | ICD-10-CM

## 2019-03-19 ENCOUNTER — Other Ambulatory Visit: Payer: Self-pay

## 2019-03-19 ENCOUNTER — Ambulatory Visit
Admission: RE | Admit: 2019-03-19 | Discharge: 2019-03-19 | Disposition: A | Discharge status: Home / Self Care | Payer: PPO | Source: Ambulatory Visit | Attending: Internal Medicine | Admitting: Internal Medicine

## 2019-03-19 DIAGNOSIS — K862 Cyst of pancreas: Secondary | ICD-10-CM

## 2019-03-19 DIAGNOSIS — I82451 Acute embolism and thrombosis of right peroneal vein: Secondary | ICD-10-CM | POA: Diagnosis not present

## 2019-03-19 DIAGNOSIS — I82441 Acute embolism and thrombosis of right tibial vein: Secondary | ICD-10-CM | POA: Diagnosis not present

## 2019-03-19 DIAGNOSIS — I82411 Acute embolism and thrombosis of right femoral vein: Secondary | ICD-10-CM | POA: Diagnosis not present

## 2019-03-19 DIAGNOSIS — I82431 Acute embolism and thrombosis of right popliteal vein: Secondary | ICD-10-CM | POA: Diagnosis not present

## 2019-03-19 DIAGNOSIS — I82421 Acute embolism and thrombosis of right iliac vein: Secondary | ICD-10-CM | POA: Diagnosis not present

## 2019-03-19 DIAGNOSIS — I2510 Atherosclerotic heart disease of native coronary artery without angina pectoris: Secondary | ICD-10-CM | POA: Diagnosis not present

## 2019-03-19 MED ORDER — IOPAMIDOL (ISOVUE-300) INJECTION 61%
100.0000 mL | Freq: Once | INTRAVENOUS | Status: AC | PRN
  Administered 2019-03-19: 100 mL via INTRAVENOUS

## 2019-04-06 DIAGNOSIS — N312 Flaccid neuropathic bladder, not elsewhere classified: Secondary | ICD-10-CM | POA: Diagnosis not present

## 2019-04-06 DIAGNOSIS — R3914 Feeling of incomplete bladder emptying: Secondary | ICD-10-CM | POA: Diagnosis not present

## 2019-04-06 DIAGNOSIS — N139 Obstructive and reflux uropathy, unspecified: Secondary | ICD-10-CM | POA: Diagnosis not present

## 2019-05-06 DIAGNOSIS — R3914 Feeling of incomplete bladder emptying: Secondary | ICD-10-CM | POA: Diagnosis not present

## 2019-05-06 DIAGNOSIS — N139 Obstructive and reflux uropathy, unspecified: Secondary | ICD-10-CM | POA: Diagnosis not present

## 2019-05-06 DIAGNOSIS — N312 Flaccid neuropathic bladder, not elsewhere classified: Secondary | ICD-10-CM | POA: Diagnosis not present

## 2019-05-10 DIAGNOSIS — H52203 Unspecified astigmatism, bilateral: Secondary | ICD-10-CM | POA: Diagnosis not present

## 2019-05-10 DIAGNOSIS — H524 Presbyopia: Secondary | ICD-10-CM | POA: Diagnosis not present

## 2019-05-10 DIAGNOSIS — Z961 Presence of intraocular lens: Secondary | ICD-10-CM | POA: Diagnosis not present

## 2019-05-23 DIAGNOSIS — N312 Flaccid neuropathic bladder, not elsewhere classified: Secondary | ICD-10-CM | POA: Diagnosis not present

## 2019-05-23 DIAGNOSIS — N320 Bladder-neck obstruction: Secondary | ICD-10-CM | POA: Diagnosis not present

## 2019-05-30 ENCOUNTER — Other Ambulatory Visit: Payer: Self-pay

## 2019-05-30 MED ORDER — OMEPRAZOLE 20 MG PO CPDR: 20.0000 mg | DELAYED_RELEASE_CAPSULE | Freq: Every day | ORAL | 1 refills | Status: DC

## 2019-06-06 DIAGNOSIS — N312 Flaccid neuropathic bladder, not elsewhere classified: Secondary | ICD-10-CM | POA: Diagnosis not present

## 2019-06-06 DIAGNOSIS — N139 Obstructive and reflux uropathy, unspecified: Secondary | ICD-10-CM | POA: Diagnosis not present

## 2019-06-06 DIAGNOSIS — R3914 Feeling of incomplete bladder emptying: Secondary | ICD-10-CM | POA: Diagnosis not present

## 2019-06-14 ENCOUNTER — Telehealth: Payer: Self-pay

## 2019-06-14 NOTE — Telephone Encounter (Signed)
Spoke with patient about his walker request.  Advised him he needs to be seen in order for it to be ordered.  Appointment scheduled.

## 2019-06-24 ENCOUNTER — Other Ambulatory Visit: Payer: Self-pay

## 2019-06-24 ENCOUNTER — Ambulatory Visit (INDEPENDENT_AMBULATORY_CARE_PROVIDER_SITE_OTHER): Payer: PPO | Admitting: Neurology

## 2019-06-24 ENCOUNTER — Encounter: Payer: Self-pay | Admitting: Neurology

## 2019-06-24 VITALS — BP 120/63 | HR 78 | Ht 72.0 in | Wt 207.0 lb

## 2019-06-24 DIAGNOSIS — R278 Other lack of coordination: Secondary | ICD-10-CM | POA: Diagnosis not present

## 2019-06-24 DIAGNOSIS — G621 Alcoholic polyneuropathy: Secondary | ICD-10-CM | POA: Diagnosis not present

## 2019-06-24 MED ORDER — OMEPRAZOLE 20 MG PO CPDR: 20.0000 mg | DELAYED_RELEASE_CAPSULE | Freq: Every day | ORAL | 1 refills | Status: DC

## 2019-06-24 NOTE — Patient Instructions (Addendum)
Start vitamin B1 (thiamine) 100mg  daily  Prescription provided for rollator  Please let me know if you would like to start physical therapy  Try to cut back alcohol   Return to clinic in 1 year

## 2019-06-24 NOTE — Progress Notes (Signed)
Follow-up Visit   Date: 06/24/19    David Gutierrez MRN: [id number] DOB: 01-16-1933   Interim History: David Gutierrez is a 85 y.o. right-handed Caucasian male with hypertension, CAD, GERD, hyperlipidemia, interstitial cystitis, s/p lumbar surgery (1952) and hypothyroidism returning to the clinic for follow-up of alcoholic neuropathy.  The patient was accompanied to the clinic by self.  History of present illness: A few years ago, he began noticing imbalance when climbing hills while playing golf.  He gave up playing golf late 2016 because of difficulty with balance.  He walk independently, but when walking his dog he takes a hiking stick which helps. He stays very active and had a trainer as well as classes that he attends for balance.   Several years ago, he also noticed that he has numbness involving the feet. He drinks about 4-5oz vodka and occasionally wine for the past 20 years.  No history of diabetes. He was diagnosed with alcohol-induced neuropathy.  He quit drinking alcohol for 4 months and noticed mild improvement in his feet paresthesias, but not enough for him to want to completely abstain from alcohol.  He has been using a foot massager with electrical stimulator, which provides some relief.  He was found to be vitamin B12 and B1 deficient and since taking supplements, his energy level has improved.   UPDATE 06/24/2019:  He is here for follow-up visit and last seen in June 2018.  He has noticed mild progression of neuropathy in the feet and lower legs (numbness, no pain).  His balance is significantly worse.  He has not suffered any falls and uses a walking stick when walking.  He is able to walk about 0.29mi - 1 mile.  He is compliant with checking his feet daily.    Medications:  Current Outpatient Medications on File Prior to Visit  Medication Sig Dispense Refill  . aspirin EC 81 MG tablet Take 81 mg by mouth daily.    . AZO-CRANBERRY PO Take 2 tablets by mouth 2 (two) times  daily.    Marland Kitchen b complex vitamins capsule Take 1 capsule by mouth daily.    . carvedilol (COREG) 6.25 MG tablet Take 1 tablet (6.25 mg total) by mouth 2 (two) times daily. 180 tablet 2  . diazepam (VALIUM) 5 MG tablet Take 2.5 mg by mouth 2 (two) times daily.     . diphenhydramine-acetaminophen (TYLENOL PM) 25-500 MG TABS tablet Take 1 tablet by mouth at bedtime.     Marland Kitchen levothyroxine (LEVOTHROID) 25 MCG tablet Take 25 mcg by mouth every evening.     Marland Kitchen omeprazole (PRILOSEC) 20 MG capsule Take 1 capsule (20 mg total) by mouth daily. Please schedule a visit with Dr. Henrene Pastor for further refills: [phone] 30 capsule 1  . polyethylene glycol (MIRALAX / GLYCOLAX) packet Take 17 g by mouth daily as needed for mild constipation.    . Probiotic Product (PROBIOTIC PO) Take 1 capsule by mouth daily.    . rosuvastatin (CRESTOR) 5 MG tablet TAKE ONE TABLET DAILY 90 tablet 3  . trimethoprim (TRIMPEX) 100 MG tablet Take 100 mg by mouth at bedtime.    . ciprofloxacin (CIPRO) 500 MG tablet TAKE 1 TABLET BY MOUTH TWICE A DAY NO LONGER THAN 7 DAYS AT A TIME     Current Facility-Administered Medications on File Prior to Visit  Medication Dose Route Frequency Provider Last Rate Last Dose  . 0.9 %  sodium chloride infusion  500 mL Intravenous Once Scarlette Shorts  N, MD        Allergies:  Allergies  Allergen Reactions  . Cyclobenzaprine Other (See Comments)    Lost balance  . Lisinopril Other (See Comments)    Affected ability to urinate  . Relafen [Nabumetone] Other (See Comments)    Problems urinating afterwards   . Statins Other (See Comments)    "my peeing"    Review of Systems:  CONSTITUTIONAL: No fevers, chills, night sweats, or weight loss.  EYES: No visual changes or eye pain ENT: No hearing changes.  No history of nose bleeds.   RESPIRATORY: No cough, wheezing and shortness of breath.   CARDIOVASCULAR: Negative for chest pain, and palpitations.   GI: Negative for abdominal discomfort, blood in stools  or black stools.  No recent change in bowel habits.   GU:  No history of incontinence.   MUSCLOSKELETAL: No history of joint pain or swelling.  No myalgias.   SKIN: Negative for lesions, rash, and itching.   ENDOCRINE: Negative for cold or heat intolerance, polydipsia or goiter.   PSYCH:  No depression or anxiety symptoms.   NEURO: As Above.   Vital Signs:  BP 120/63   Pulse 78   Ht 6' (1.829 m)   Wt 207 lb (93.9 kg)   SpO2 95%   BMI 28.07 kg/m   Neurological Exam: MENTAL STATUS including orientation to time, place, person, recent and remote memory, attention span and concentration, language, and fund of knowledge is normal.  Speech is not dysarthric.  CRANIAL NERVES:  Pupils round and reactive to light.  Extraocular muscles intact.  Face is symmetric.  MOTOR:  Motor strength is 5/5 throughout including distally in the feet.  No pronator drift.  Tone is normal.    MSRs:  Right                                                                 Left brachioradialis 2+  brachioradialis 2+  biceps 2+  biceps 2+  triceps 2+  triceps 2+  patellar 1+  patellar 1+  ankle jerk 0  ankle jerk 0   SENSORY:  Vibration and light touch reduced distal to ankles bilaterally.  There is mild sway with Rhomberg testing.  COORDINATION/GAIT:   Gait wide-based, unassisted and somewhat unsteady.  LABS: Labs 03/02/2016:  TSH 3.02, Na 142, K 4.7, Glucose 94, BUN 14, Cr 0.7, LFTs normal Labs 05/06/2016:  Vitamin B12 242, folate 12.2, copper 97, SPEP with IFE no M protein  Lab Results  Component Value Date   VITAMINB12 421 05/08/2017    IMPRESSION: 1.  Alcoholic peripheral neuropathy manifesting with numbness of the feet and sensory ataxia.  He tried stopping alcohol for 1 year and did not notice marked benefit so has resumed drinking alcohol daily.  Clinically, there is mild progression of neuropathy and sensory ataxia, as expected.  Management is supportive.   I have encouraged him to inspect feet  daily and always use a shower chair.  He does not want to be referred for physical therapy, so I have asked that he try to engage in balance exercises at home.  [employer] provides classes for this.   For long distance, start using a rollator - prescription provided. He is aware that ongoing alcohol consumption  will cause neuropathy to progress  2. 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     Return to clinic in 1 year  Thank you for allowing me to participate in patient's care.  If I can answer any additional questions, I would be pleased to do so.    Sincerely,     K. Posey Pronto, DO

## 2019-07-06 DIAGNOSIS — N312 Flaccid neuropathic bladder, not elsewhere classified: Secondary | ICD-10-CM | POA: Diagnosis not present

## 2019-07-06 DIAGNOSIS — N139 Obstructive and reflux uropathy, unspecified: Secondary | ICD-10-CM | POA: Diagnosis not present

## 2019-07-06 DIAGNOSIS — R3914 Feeling of incomplete bladder emptying: Secondary | ICD-10-CM | POA: Diagnosis not present

## 2019-07-08 DIAGNOSIS — G9009 Other idiopathic peripheral autonomic neuropathy: Secondary | ICD-10-CM | POA: Diagnosis not present

## 2019-07-09 ENCOUNTER — Ambulatory Visit: Payer: PPO | Admitting: Neurology

## 2019-07-18 ENCOUNTER — Other Ambulatory Visit: Payer: Self-pay

## 2019-07-18 MED ORDER — OMEPRAZOLE 20 MG PO CPDR: 20.0000 mg | DELAYED_RELEASE_CAPSULE | Freq: Every day | ORAL | 3 refills | Status: DC

## 2019-07-30 DIAGNOSIS — L720 Epidermal cyst: Secondary | ICD-10-CM | POA: Diagnosis not present

## 2019-07-30 DIAGNOSIS — Z85828 Personal history of other malignant neoplasm of skin: Secondary | ICD-10-CM | POA: Diagnosis not present

## 2019-08-06 DIAGNOSIS — N139 Obstructive and reflux uropathy, unspecified: Secondary | ICD-10-CM | POA: Diagnosis not present

## 2019-08-06 DIAGNOSIS — R3914 Feeling of incomplete bladder emptying: Secondary | ICD-10-CM | POA: Diagnosis not present

## 2019-08-06 DIAGNOSIS — N312 Flaccid neuropathic bladder, not elsewhere classified: Secondary | ICD-10-CM | POA: Diagnosis not present

## 2019-08-15 DIAGNOSIS — N312 Flaccid neuropathic bladder, not elsewhere classified: Secondary | ICD-10-CM | POA: Diagnosis not present

## 2019-08-15 DIAGNOSIS — N3021 Other chronic cystitis with hematuria: Secondary | ICD-10-CM | POA: Diagnosis not present

## 2019-09-04 NOTE — Progress Notes (Signed)
Cardiology Office Note   Date:  09/05/2019   ID:  David Gutierrez, David Gutierrez 1933/03/31, MRN [id number]  PCP:  Leanna Battles, MD  Cardiologist:   Dorris Carnes, MD   F/U of CHF     History of Present Illness:  HPI:  David Gutierrez is a 85 y.o. year-old male with history of nonischemic cardiomyopathy with chronic LBBB and nonobstructive CAD, who presents for follow-up of cardiomyopathy. I last saw David Gutierrez in 05/2017, at which time he reported doing well with the exception of long-standing neuropathy in both legs.   I saw the pt in clinic back in 2019 (November)  SInce seen the pt says he is breathing OK   Denies CP  Actvity is limited due to neuropathy   Concerned about falling   Takes things slow   Current Meds  Medication Sig  . aspirin EC 81 MG tablet Take 81 mg by mouth daily.  . AZO-CRANBERRY PO Take 2 tablets by mouth 2 (two) times daily.  Marland Kitchen b complex vitamins capsule Take 1 capsule by mouth daily.  . carvedilol (COREG) 6.25 MG tablet Take 1 tablet (6.25 mg total) by mouth 2 (two) times daily.  . ciprofloxacin (CIPRO) 500 MG tablet TAKE 1 TABLET BY MOUTH TWICE A DAY NO LONGER THAN 7 DAYS AT A TIME  . diazepam (VALIUM) 5 MG tablet Take 2.5 mg by mouth 2 (two) times daily.   . diphenhydramine-acetaminophen (TYLENOL PM) 25-500 MG TABS tablet Take 1 tablet by mouth at bedtime.   Marland Kitchen levothyroxine (LEVOTHROID) 25 MCG tablet Take 25 mcg by mouth every evening.   Marland Kitchen omeprazole (PRILOSEC) 20 MG capsule Take 1 capsule (20 mg total) by mouth daily.  . polyethylene glycol (MIRALAX / GLYCOLAX) packet Take 17 g by mouth daily as needed for mild constipation.  . Probiotic Product (PROBIOTIC PO) Take 1 capsule by mouth daily.  . rosuvastatin (CRESTOR) 5 MG tablet TAKE ONE TABLET DAILY  . trimethoprim (TRIMPEX) 100 MG tablet Take 100 mg by mouth at bedtime.   Current Facility-Administered Medications for the 09/05/19 encounter (Office Visit) with Fay Records, MD  Medication  . 0.9 %  sodium  chloride infusion     Allergies:   Cyclobenzaprine, Lisinopril, Relafen [nabumetone], and Statins   Past Medical History:  Diagnosis Date  . Alcoholic peripheral neuropathy [facility])    neurologist-  dr patel--- feet numbness and sensory ataxia  . Arthritis   . B12 deficiency   . BPH (benign prostatic hyperplasia)   . CAD (coronary artery disease) cardiolgoist-  dr Harrell Gave end (previouly dr dalton [name])   a. Myoview 11/13:  EF 38%, inf and IS defect c/w scar but no ischemia:  b. Cardiac CT 11/13:  Ca score 318 Agatson units, pLAD and dCFX plaque;   c. LHC 11/07/12:  pLAD 30%, oD1 40%, oCFX 30%, dCFX 40-50%, mRCA 40%, EF 50% (frequent PVCs and short run of NSVT with injection/    per last echo 01/ 2017  ef 55-60%  . Chronic constipation   . Diverticulosis of colon   . Esophageal reflux   . External hemorrhoids   . First degree heart block   . Hiatal hernia   . History of adenomatous polyp of colon   . History of esophageal stricture 08/11/2015   s/p  dilatation  . History of lower GI bleeding 03/01/2017   s/p  flexiable simoidscopy w/ clipping rectum ulcer  . Hypogonadism male   . Hypothyroidism   . Interstitial cystitis   .  Irritable bowel syndrome   . LBBB (left bundle branch block)   . NICM (nonischemic cardiomyopathy) [facility]) cardiologist-  dr Harrell Gave end (previously dr dalton [name])--  per last echo 01/ 2017  ef 55-60%   ? 2/2 LBBB => a. echo 11/13: diff HK, worse in septum and apex, mod LVE, mild LVH, EF 40%, mild AI, mild MR, mod LAE  . Pre-diabetes   . Self-catheterizes urinary bladder    bid to tid   . Wears glasses   . Wears hearing aid in both ears     Past Surgical History:  Procedure Laterality Date  . CARDIAC CATHETERIZATION  11-07-2012    dr [name]   nonobstructive CAD (pLAD 30%, ostial D1 40%, ostial LCx 30%, dLCx diffuse 40-50%, mRCA 40%) ;  LVSF 50% but diffiult due to PVCs and short run VT with injection;  cardiomyopathy mostly likely a LBBB  cardiomyopathy  . CARDIOVASCULAR STRESS TEST  10-16-2012   dr [name]   Low risk adenosine nuclear study (no exercise) w/ a fixed inferior and inferoseptal defect without ischemia (question as whether this abnormality due to LBBB cardiomyopathy or due to scar)/  LV ef 38%,  LV wall motion decreased of the septum and entire apex  . CATARACT EXTRACTION W/ INTRAOCULAR LENS  IMPLANT, BILATERAL  2012  approx.  . CYSTO/  HYDRODISTENTION/  BLADDER BIOPSY  04-20-2005    dr Lawerance Bach  [facility]  . ETHMOIDECTOMY Right 12/12/2016   Procedure: RIGHT ENDOSCOPIC ETHMOIDECTOMY;  Surgeon: [name], MD;  Location: [place];  Service: ENT;  Laterality: Right;  . FLEXIBLE SIGMOIDOSCOPY N/A 03/02/2017   Procedure: FLEXIBLE SIGMOIDOSCOPY;  Surgeon: Ladene Artist, MD;  [facility];  Service: Endoscopy;  Laterality: N/A;  . [place]  . MAXILLARY ANTROSTOMY Right 12/12/2016   Procedure: RIGHT ENDOSCOPIC MAXILLARY ANTROSTOMY;  Surgeon: [name], MD;  Location: [place];  Service: ENT;  Laterality: Right;  . SINUS ENDO W/FUSION Right 12/12/2016   Procedure: ENDOSCOPIC SINUS SURGERY WITH NAVIGATION;  Surgeon: [name], MD;  Location: [place];  Service: ENT;  Laterality: Right;  . SPHENOIDECTOMY Right 12/12/2016   Procedure: RIGHT ENDOSCOPIC SPHENOIDECTOMY;  Surgeon: [name], MD;  Location: [place];  Service: ENT;  Laterality: Right;  . TONSILLECTOMY AND ADENOIDECTOMY  child  . TOTAL KNEE ARTHROPLASTY Left 10/14/2014   Procedure: LEFT TOTAL KNEE ARTHROPLASTY;  Surgeon: [name], MD;  [facility];  Service: Orthopedics;  Laterality: Left;  . TOTAL KNEE ARTHROPLASTY Right 1990s  . TRANSTHORACIC ECHOCARDIOGRAM  12-24-2015   dr [name]   ef 55-60%,  grade 1 diastolic dysfunction/  trivial AR and TR  mild MR and PR/  moderate LAE  . TRANSURETHRAL RESECTION OF PROSTATE  06-11-2010   dr Gaynelle Arabian  [facility]   w/  GYRUS  . TRANSURETHRAL  RESECTION OF PROSTATE N/A 12/06/2017   Procedure: TRANSURETHRAL RESECTION OF THE PROSTATE (TURP)/ BIPOLAR;  Surgeon: Ceasar Mons, MD;  Location: [facility];  Service: Urology;  Laterality: N/A;  . TRANSURETHRAL RESECTION OF PROSTATE N/A 04/11/2018   Procedure: TRANSURETHRAL RESECTION OF THE PROSTATE (TURP);  Surgeon: Ceasar Mons, MD;  [facility];  Service: Urology;  Laterality: N/A;     Social History:  The patient  reports that he quit smoking about 51 years ago. His smoking use included cigarettes. He has a 30.00 pack-year smoking history. He has never used smokeless tobacco. He reports current  alcohol use. He reports that he does not use drugs.   Family History:  The patient's family history includes Diabetes type I in his son; Healthy in his father, mother, sister, and sister; Heart disease in his brother; Other in his sister; Thyroid cancer in his brother.    ROS:  Please see the history of present illness. All other systems are reviewed and  Negative to the above problem except as noted.    PHYSICAL EXAM: VS:  BP 140/70   Pulse (!) 59   Ht 6' (1.829 m)   Wt 205 lb 9.6 oz (93.3 kg)   BMI 27.88 kg/m   GEN: Overweight 85 yo , in no acute distress  HEENT: normal  Neck: JVP is normal  No, carotid bruits Cardiac: RRR; no murmurs, rubs, or gallops,no edema  Respiratory:  clear to auscultation bilaterally, normal work of breathing GI: soft, nontender, nondistended, + BS  No hepatomegaly  MS: no deformity Moving all extremities   Skin: warm and dry, no rash Neuro:  Strength and sensation are intact Psych: euthymic mood, full affect   EKG:  EKG is not ordered today.   Lipid Panel    Component Value Date/Time   CHOL 145 03/24/2016 0735   TRIG 92 03/24/2016 0735   HDL 46 03/24/2016 0735   CHOLHDL 3.2 03/24/2016 0735   VLDL 18 03/24/2016 0735   LDLCALC 81 03/24/2016 0735      Wt Readings from Last 3 Encounters:  09/05/19 205 lb  9.6 oz (93.3 kg)  06/24/19 207 lb (93.9 kg)  10/29/18 213 lb 12.8 oz (97 kg)      ASSESSMENT AND PLAN:  1  NICM   Volume status appears OK  Echo in 2017 LVEF normalized     2  CAD   Mild to mod at cath in 2013   No symptoms to sugg angina  3   HL  Lipids in April:   LDL 65  HDL 56   Continue to follow    Encouraged him t ostay active   Will f/u in 8 months   Current medicines are reviewed at length with the patient today.  The patient does not have concerns regarding medicines.  Signed, Dorris Carnes, MD  09/05/2019 10:23 AM    [address], [place], [place]  [address] Phone: [phone]; Fax: [phone]

## 2019-09-05 ENCOUNTER — Encounter: Payer: Self-pay | Admitting: Internal Medicine

## 2019-09-05 ENCOUNTER — Other Ambulatory Visit: Payer: Self-pay

## 2019-09-05 ENCOUNTER — Encounter (INDEPENDENT_AMBULATORY_CARE_PROVIDER_SITE_OTHER): Payer: Self-pay

## 2019-09-05 ENCOUNTER — Ambulatory Visit (INDEPENDENT_AMBULATORY_CARE_PROVIDER_SITE_OTHER): Payer: PPO | Admitting: Internal Medicine

## 2019-09-05 VITALS — BP 140/70 | HR 59 | Ht 72.0 in | Wt 205.6 lb

## 2019-09-05 DIAGNOSIS — N312 Flaccid neuropathic bladder, not elsewhere classified: Secondary | ICD-10-CM | POA: Diagnosis not present

## 2019-09-05 DIAGNOSIS — R3914 Feeling of incomplete bladder emptying: Secondary | ICD-10-CM | POA: Diagnosis not present

## 2019-09-05 DIAGNOSIS — I2510 Atherosclerotic heart disease of native coronary artery without angina pectoris: Secondary | ICD-10-CM | POA: Diagnosis not present

## 2019-09-05 DIAGNOSIS — N139 Obstructive and reflux uropathy, unspecified: Secondary | ICD-10-CM | POA: Diagnosis not present

## 2019-09-05 NOTE — Patient Instructions (Signed)
Medication Instructions:  No changes If you need a refill on your cardiac medications before your next appointment, please call your pharmacy.   Lab work: none If you have labs (blood work) drawn today and your tests are completely normal, you will receive your results only by: . MyChart Message (if you have MyChart) OR . A paper copy in the mail If you have any lab test that is abnormal or we need to change your treatment, we will call you to review the results.  Testing/Procedures: none  Follow-Up: At [facility], you and your health needs are our priority.  As part of our continuing mission to provide you with exceptional heart care, we have created designated Provider Care Teams.  These Care Teams include your primary Cardiologist (physician) and Advanced Practice Providers (APPs -  Physician Assistants and Nurse Practitioners) who all work together to provide you with the care you need, when you need it. You will need a follow up appointment in:  8 months.  Please call our office 2 months in advance to schedule this appointment.  You may see Paula Ross, MD or one of the following Advanced Practice Providers on your designated Care Team: Scott Weaver, PA-C Vin Bhagat, PA-C . Janine Hammond, NP  Any Other Special Instructions Will Be Listed Below (If Applicable).    

## 2019-09-30 DIAGNOSIS — L821 Other seborrheic keratosis: Secondary | ICD-10-CM | POA: Diagnosis not present

## 2019-09-30 DIAGNOSIS — L570 Actinic keratosis: Secondary | ICD-10-CM | POA: Diagnosis not present

## 2019-09-30 DIAGNOSIS — Z85828 Personal history of other malignant neoplasm of skin: Secondary | ICD-10-CM | POA: Diagnosis not present

## 2019-09-30 DIAGNOSIS — L820 Inflamed seborrheic keratosis: Secondary | ICD-10-CM | POA: Diagnosis not present

## 2019-09-30 DIAGNOSIS — L812 Freckles: Secondary | ICD-10-CM | POA: Diagnosis not present

## 2019-09-30 DIAGNOSIS — D692 Other nonthrombocytopenic purpura: Secondary | ICD-10-CM | POA: Diagnosis not present

## 2019-10-07 DIAGNOSIS — R3914 Feeling of incomplete bladder emptying: Secondary | ICD-10-CM | POA: Diagnosis not present

## 2019-10-07 DIAGNOSIS — N312 Flaccid neuropathic bladder, not elsewhere classified: Secondary | ICD-10-CM | POA: Diagnosis not present

## 2019-10-07 DIAGNOSIS — N139 Obstructive and reflux uropathy, unspecified: Secondary | ICD-10-CM | POA: Diagnosis not present

## 2019-10-09 ENCOUNTER — Other Ambulatory Visit: Payer: Self-pay | Admitting: Internal Medicine

## 2019-10-09 DIAGNOSIS — I2510 Atherosclerotic heart disease of native coronary artery without angina pectoris: Secondary | ICD-10-CM

## 2019-10-09 DIAGNOSIS — I428 Other cardiomyopathies: Secondary | ICD-10-CM

## 2019-10-22 ENCOUNTER — Other Ambulatory Visit: Payer: Self-pay

## 2019-10-22 DIAGNOSIS — Z20822 Contact with and (suspected) exposure to covid-19: Secondary | ICD-10-CM

## 2019-10-23 LAB — NOVEL CORONAVIRUS, NAA: SARS-CoV-2, NAA: NOT DETECTED

## 2019-11-06 DIAGNOSIS — N139 Obstructive and reflux uropathy, unspecified: Secondary | ICD-10-CM | POA: Diagnosis not present

## 2019-11-06 DIAGNOSIS — N312 Flaccid neuropathic bladder, not elsewhere classified: Secondary | ICD-10-CM | POA: Diagnosis not present

## 2019-11-06 DIAGNOSIS — R3914 Feeling of incomplete bladder emptying: Secondary | ICD-10-CM | POA: Diagnosis not present

## 2019-11-14 DIAGNOSIS — N3021 Other chronic cystitis with hematuria: Secondary | ICD-10-CM | POA: Diagnosis not present

## 2019-11-14 DIAGNOSIS — R3914 Feeling of incomplete bladder emptying: Secondary | ICD-10-CM | POA: Diagnosis not present

## 2019-12-02 ENCOUNTER — Other Ambulatory Visit: Payer: Self-pay | Admitting: Internal Medicine

## 2019-12-08 DIAGNOSIS — N312 Flaccid neuropathic bladder, not elsewhere classified: Secondary | ICD-10-CM | POA: Diagnosis not present

## 2019-12-08 DIAGNOSIS — R3914 Feeling of incomplete bladder emptying: Secondary | ICD-10-CM | POA: Diagnosis not present

## 2019-12-08 DIAGNOSIS — N139 Obstructive and reflux uropathy, unspecified: Secondary | ICD-10-CM | POA: Diagnosis not present

## 2019-12-09 ENCOUNTER — Telehealth: Payer: Self-pay | Admitting: Internal Medicine

## 2019-12-10 MED ORDER — OMEPRAZOLE 20 MG PO CPDR: 20.0000 mg | DELAYED_RELEASE_CAPSULE | Freq: Every day | ORAL | 3 refills | Status: DC

## 2019-12-10 NOTE — Telephone Encounter (Signed)
Refilled Omeprazole 

## 2019-12-13 ENCOUNTER — Other Ambulatory Visit: Payer: Self-pay | Admitting: Internal Medicine

## 2019-12-13 NOTE — Telephone Encounter (Signed)
Please review for refill.  

## 2020-01-06 DIAGNOSIS — N312 Flaccid neuropathic bladder, not elsewhere classified: Secondary | ICD-10-CM | POA: Diagnosis not present

## 2020-01-06 DIAGNOSIS — R3914 Feeling of incomplete bladder emptying: Secondary | ICD-10-CM | POA: Diagnosis not present

## 2020-01-06 DIAGNOSIS — N139 Obstructive and reflux uropathy, unspecified: Secondary | ICD-10-CM | POA: Diagnosis not present

## 2020-01-15 ENCOUNTER — Encounter: Payer: Self-pay | Admitting: Internal Medicine

## 2020-01-15 ENCOUNTER — Ambulatory Visit (INDEPENDENT_AMBULATORY_CARE_PROVIDER_SITE_OTHER): Payer: PPO | Admitting: Internal Medicine

## 2020-01-15 VITALS — BP 114/70 | HR 80 | Temp 97.6°F | Ht 70.0 in | Wt 206.5 lb

## 2020-01-15 DIAGNOSIS — R1319 Other dysphagia: Secondary | ICD-10-CM

## 2020-01-15 DIAGNOSIS — K219 Gastro-esophageal reflux disease without esophagitis: Secondary | ICD-10-CM

## 2020-01-15 DIAGNOSIS — R1310 Dysphagia, unspecified: Secondary | ICD-10-CM

## 2020-01-15 DIAGNOSIS — K222 Esophageal obstruction: Secondary | ICD-10-CM | POA: Diagnosis not present

## 2020-01-15 MED ORDER — OMEPRAZOLE 20 MG PO CPDR: 20.0000 mg | DELAYED_RELEASE_CAPSULE | Freq: Every day | ORAL | 3 refills | Status: DC

## 2020-01-15 NOTE — Progress Notes (Signed)
HISTORY OF PRESENT ILLNESS:  David Gutierrez is a 86 y.o. male with GERD complicated by esophagitis and peptic stricture as well as multiple adenomatous colon polyps who presents today regarding management of his chronic GERD.  The patient takes omeprazole 20 mg daily.  He states that his help classic reflux symptoms.  He does report what sounds to me like significant dysphagia diet of such as chicken.  Needs to chew his food well and eat slowly.  He has not had a food impaction.  His last upper endoscopy with esophageal dilation was performed September 2016.  He has not been seen in this office by myself since 2016.  He did see our PA 2019.  He has a cystic lesion on his pancreas which has been followed radiologically and appears benign.  He has aged out of surveillance colonoscopy.  His biggest issues these days are difficulties with his bladder requiring self-catheterization and bad peripheral neuropathy affecting his ability to walk and be active.  He had a comprehensive evaluation with his PCP last month  REVIEW OF SYSTEMS:  All non-GI ROS negative unless otherwise stated in the HPI except for arthritis  Past Medical History:  Diagnosis Date  . Alcoholic peripheral neuropathy [facility])    neurologist-  dr patel--- feet numbness and sensory ataxia  . Arthritis   . B12 deficiency   . BPH (benign prostatic hyperplasia)   . CAD (coronary artery disease) cardiolgoist-  dr Harrell Gave end (previouly dr dalton [name])   a. Myoview 11/13:  EF 38%, inf and IS defect c/w scar but no ischemia:  b. Cardiac CT 11/13:  Ca score 318 Agatson units, pLAD and dCFX plaque;   c. LHC 11/07/12:  pLAD 30%, oD1 40%, oCFX 30%, dCFX 40-50%, mRCA 40%, EF 50% (frequent PVCs and short run of NSVT with injection/    per last echo 01/ 2017  ef 55-60%  . Chronic constipation   . Diverticulosis of colon   . Esophageal reflux   . External hemorrhoids   . First degree heart block   . Hiatal hernia   . History of adenomatous polyp  of colon   . History of esophageal stricture 08/11/2015   s/p  dilatation  . History of lower GI bleeding 03/01/2017   s/p  flexiable simoidscopy w/ clipping rectum ulcer  . Hypogonadism male   . Hypothyroidism   . Interstitial cystitis   . Irritable bowel syndrome   . LBBB (left bundle branch block)   . NICM (nonischemic cardiomyopathy) [facility]) cardiologist-  dr Harrell Gave end (previously dr dalton [name])--  per last echo 01/ 2017  ef 55-60%   ? 2/2 LBBB => a. echo 11/13: diff HK, worse in septum and apex, mod LVE, mild LVH, EF 40%, mild AI, mild MR, mod LAE  . Pre-diabetes   . Self-catheterizes urinary bladder    bid to tid   . Wears glasses   . Wears hearing aid in both ears     Past Surgical History:  Procedure Laterality Date  . CARDIAC CATHETERIZATION  11-07-2012    dr [name]   nonobstructive CAD (pLAD 30%, ostial D1 40%, ostial LCx 30%, dLCx diffuse 40-50%, mRCA 40%) ;  LVSF 50% but diffiult due to PVCs and short run VT with injection;  cardiomyopathy mostly likely a LBBB cardiomyopathy  . CARDIOVASCULAR STRESS TEST  10-16-2012   dr [name]   Low risk adenosine nuclear study (no exercise) w/ a fixed inferior and inferoseptal defect without ischemia (question as whether  this abnormality due to LBBB cardiomyopathy or due to scar)/  LV ef 38%,  LV wall motion decreased of the septum and entire apex  . CATARACT EXTRACTION W/ INTRAOCULAR LENS  IMPLANT, BILATERAL  2012  approx.  . CYSTO/  HYDRODISTENTION/  BLADDER BIOPSY  04-20-2005    dr Lawerance Bach  [facility]  . ETHMOIDECTOMY Right 12/12/2016   Procedure: RIGHT ENDOSCOPIC ETHMOIDECTOMY;  Surgeon: [name], MD;  Location: [place];  Service: ENT;  Laterality: Right;  . FLEXIBLE SIGMOIDOSCOPY N/A 03/02/2017   Procedure: FLEXIBLE SIGMOIDOSCOPY;  Surgeon: Ladene Artist, MD;  [facility];  Service: Endoscopy;  Laterality: N/A;  . [place]  . MAXILLARY ANTROSTOMY Right 12/12/2016   Procedure: RIGHT  ENDOSCOPIC MAXILLARY ANTROSTOMY;  Surgeon: [name], MD;  Location: [place];  Service: ENT;  Laterality: Right;  . SINUS ENDO W/FUSION Right 12/12/2016   Procedure: ENDOSCOPIC SINUS SURGERY WITH NAVIGATION;  Surgeon: [name], MD;  Location: [place];  Service: ENT;  Laterality: Right;  . SPHENOIDECTOMY Right 12/12/2016   Procedure: RIGHT ENDOSCOPIC SPHENOIDECTOMY;  Surgeon: [name], MD;  Location: [place];  Service: ENT;  Laterality: Right;  . TONSILLECTOMY AND ADENOIDECTOMY  child  . TOTAL KNEE ARTHROPLASTY Left 10/14/2014   Procedure: LEFT TOTAL KNEE ARTHROPLASTY;  Surgeon: [name], MD;  [facility];  Service: Orthopedics;  Laterality: Left;  . TOTAL KNEE ARTHROPLASTY Right 1990s  . TRANSTHORACIC ECHOCARDIOGRAM  12-24-2015   dr [name]   ef 55-60%,  grade 1 diastolic dysfunction/  trivial AR and TR  mild MR and PR/  moderate LAE  . TRANSURETHRAL RESECTION OF PROSTATE  06-11-2010   dr Gaynelle Arabian  [facility]   w/  GYRUS  . TRANSURETHRAL RESECTION OF PROSTATE N/A 12/06/2017   Procedure: TRANSURETHRAL RESECTION OF THE PROSTATE (TURP)/ BIPOLAR;  Surgeon: Ceasar Mons, MD;  Location: [facility];  Service: Urology;  Laterality: N/A;  . TRANSURETHRAL RESECTION OF PROSTATE N/A 04/11/2018   Procedure: TRANSURETHRAL RESECTION OF THE PROSTATE (TURP);  Surgeon: Ceasar Mons, MD;  [facility];  Service: Urology;  Laterality: N/A;    Social History David Gutierrez  reports that he quit smoking about 52 years ago. His smoking use included cigarettes. He has a 30.00 pack-year smoking history. He has never used smokeless tobacco. He reports current alcohol use. He reports that he does not use drugs.  family history includes Diabetes type I in his son; Healthy in his father, mother, sister, and sister; Heart disease in his brother; Other in his sister; Thyroid cancer in his brother.  Allergies  Allergen Reactions   . Cyclobenzaprine Other (See Comments)    Lost balance  . Lisinopril Other (See Comments)    Affected ability to urinate  . Relafen [Nabumetone] Other (See Comments)    Problems urinating afterwards   . Statins Other (See Comments)    "my peeing"       PHYSICAL EXAMINATION: Vital signs: BP 114/70 (BP Location: Left Arm, Patient Position: Sitting, Cuff Size: Normal)   Pulse 80   Temp 97.6 F (36.4 C)   Ht 5\' 10"  (1.778 m) Comment: height measured without shoes  Wt 206 lb 8 oz (93.7 kg)   BMI 29.63 kg/m   Constitutional: generally well-appearing, no acute distress Psychiatric: alert and oriented x3, cooperative Eyes: extraocular movements intact, anicteric, conjunctiva pink Mouth: Mask Abdomen: Not examined  ASSESSMENT:   1.  GERD.  Chronic.  Esophagitis on endoscopy 2.  Esophageal stricture.  Previous esophageal dilation 2016 3.  Esophageal dysphagia.  Continues.  I have encouraged him to consider repeat endoscopy with dilation.  He wishes to wait 4.  History of multiple adenomatous colon polyps.  Aged out of surveillance   PLAN:  1.  Reflux precautions 2.  Refill omeprazole.  Medication risks discussed 3.  Encouraged contact the office should he decide to proceed with endoscopy and esophageal dilation 4.  Routine GI follow-up as needed

## 2020-01-15 NOTE — Patient Instructions (Addendum)
We have sent the following medications to your pharmacy for you to pick up at your convenience:  Omeprazole.  Please follow up in one year  

## 2020-01-20 ENCOUNTER — Encounter: Payer: Self-pay | Admitting: Internal Medicine

## 2020-02-03 DIAGNOSIS — N312 Flaccid neuropathic bladder, not elsewhere classified: Secondary | ICD-10-CM | POA: Diagnosis not present

## 2020-02-03 DIAGNOSIS — N139 Obstructive and reflux uropathy, unspecified: Secondary | ICD-10-CM | POA: Diagnosis not present

## 2020-02-03 DIAGNOSIS — R3914 Feeling of incomplete bladder emptying: Secondary | ICD-10-CM | POA: Diagnosis not present

## 2020-02-05 ENCOUNTER — Other Ambulatory Visit: Payer: Self-pay

## 2020-02-05 ENCOUNTER — Ambulatory Visit (AMBULATORY_SURGERY_CENTER): Payer: Self-pay | Admitting: *Deleted

## 2020-02-05 VITALS — Temp 97.6°F | Ht 70.0 in | Wt 205.0 lb

## 2020-02-05 DIAGNOSIS — G609 Hereditary and idiopathic neuropathy, unspecified: Secondary | ICD-10-CM | POA: Insufficient documentation

## 2020-02-05 DIAGNOSIS — R1310 Dysphagia, unspecified: Secondary | ICD-10-CM

## 2020-02-05 NOTE — Progress Notes (Signed)

## 2020-02-06 ENCOUNTER — Encounter: Payer: Self-pay | Admitting: Internal Medicine

## 2020-02-13 DIAGNOSIS — R3914 Feeling of incomplete bladder emptying: Secondary | ICD-10-CM | POA: Diagnosis not present

## 2020-02-13 DIAGNOSIS — N312 Flaccid neuropathic bladder, not elsewhere classified: Secondary | ICD-10-CM | POA: Diagnosis not present

## 2020-02-13 DIAGNOSIS — N3021 Other chronic cystitis with hematuria: Secondary | ICD-10-CM | POA: Diagnosis not present

## 2020-02-19 ENCOUNTER — Encounter: Payer: Self-pay | Admitting: Internal Medicine

## 2020-02-19 ENCOUNTER — Ambulatory Visit (AMBULATORY_SURGERY_CENTER): Payer: PPO | Admitting: Internal Medicine

## 2020-02-19 ENCOUNTER — Other Ambulatory Visit: Payer: Self-pay

## 2020-02-19 VITALS — BP 127/67 | HR 60 | Temp 94.6°F | Resp 16 | Ht 70.0 in | Wt 205.0 lb

## 2020-02-19 DIAGNOSIS — R1310 Dysphagia, unspecified: Secondary | ICD-10-CM | POA: Diagnosis not present

## 2020-02-19 DIAGNOSIS — K222 Esophageal obstruction: Secondary | ICD-10-CM | POA: Diagnosis not present

## 2020-02-19 DIAGNOSIS — K219 Gastro-esophageal reflux disease without esophagitis: Secondary | ICD-10-CM | POA: Diagnosis not present

## 2020-02-19 DIAGNOSIS — K208 Other esophagitis without bleeding: Secondary | ICD-10-CM | POA: Diagnosis not present

## 2020-02-19 MED ORDER — SODIUM CHLORIDE 0.9 % IV SOLN: 500.0000 mL | Freq: Once | INTRAVENOUS | Status: DC

## 2020-02-19 NOTE — Op Note (Signed)
[place] Patient Name: David Gutierrez Procedure Date: 02/19/2020 10:42 AM MRN: [id number] Endoscopist: [name]. Henrene Pastor , MD Age: 86 Referring MD:  Date of Birth: 26-Dec-1932 Gender: Male Account #: [id number] Procedure:                Upper GI endoscopy with biopsy; with [facility]                            dilation 91 [place] Indications:              Dysphagia. Previous upper endoscopy with esophageal                            dilation 2016 (56 [place]) in 2017 (58 [place]) Medicines:                Monitored Anesthesia Care Procedure:                Pre-Anesthesia Assessment:                           - Prior to the procedure, a History and Physical                            was performed, and patient medications and                            allergies were reviewed. The patient's tolerance of                            previous anesthesia was also reviewed. The risks                            and benefits of the procedure and the sedation                            options and risks were discussed with the patient.                            All questions were answered, and informed consent                            was obtained. Prior Anticoagulants: The patient has                            taken no previous anticoagulant or antiplatelet                            agents. ASA Grade Assessment: II - A patient with                            mild systemic disease. After reviewing the risks                            and benefits, the patient was deemed in  satisfactory condition to undergo the procedure.                           After obtaining informed consent, the endoscope was                            passed under direct vision. Throughout the                            procedure, the patient's blood pressure, pulse, and                            oxygen saturations were monitored continuously. The                            Endoscope was  introduced through the mouth, and                            advanced to the second part of duodenum. The upper                            GI endoscopy was accomplished without difficulty.                            The patient tolerated the procedure well. Scope In: Scope Out: Findings:                 A single 3 mm mucosal nodule was found in the                            proximal esophagus, 20 cm from the incisors.                            Biopsies were taken with a cold forceps for                            histology. The nodule was removed                           One benign-appearing, intrinsic moderate stenosis                            was found 42 cm from the incisors. This stenosis                            measured 15 cm (in length). The scope was                            withdrawn. Dilation was performed with a Maloney                            dilator with no resistance at 56 Fr.  The stomach was normal save a small hiatal hernia.                           The examined duodenum was normal.                           The cardia and gastric fundus were normal on                            retroflexion. Complications:            No immediate complications. Estimated Blood Loss:     Estimated blood loss: none. Impression:               - Mucosal nodule found in the esophagus.                            Biopsied/removed.                           - Benign-appearing esophageal stenosis. Dilated.                           - Normal stomach.                           - Normal examined duodenum. Recommendation:           - Patient has a contact number available for                            emergencies. The signs and symptoms of potential                            delayed complications were discussed with the                            patient. Return to normal activities tomorrow.                            Written discharge instructions were  provided to the                            patient.                           - Post dilation diet.                           - Continue present medications.                           - Await pathology results. We will schedule letter                            with results                           -  Contact Dr. Henrene Pastor for recurrent or persistent                            problems with swallowing [name]. Henrene Pastor, MD 02/19/2020 11:11:31 AM This report has been signed electronically.

## 2020-02-19 NOTE — Patient Instructions (Signed)
YOU HAD AN ENDOSCOPIC PROCEDURE TODAY AT [facility]:   Refer to the procedure report that was given to you for any specific questions about what was found during the examination.  If the procedure report does not answer your questions, please call your gastroenterologist to clarify.  If you requested that your care partner not be given the details of your procedure findings, then the procedure report has been included in a sealed envelope for you to review at your convenience later.  YOU SHOULD EXPECT: Some feelings of bloating in the abdomen. Passage of more gas than usual.  Walking can help get rid of the air that was put into your GI tract during the procedure and reduce the bloating. If you had a lower endoscopy (such as a colonoscopy or flexible sigmoidoscopy) you may notice spotting of blood in your stool or on the toilet paper. If you underwent a bowel prep for your procedure, you may not have a normal bowel movement for a few days.  Please Note:  You might notice some irritation and congestion in your nose or some drainage.  This is from the oxygen used during your procedure.  There is no need for concern and it should clear up in a day or so.  SYMPTOMS TO REPORT IMMEDIATELY:     Following upper endoscopy (EGD)  Vomiting of blood or coffee ground material  New chest pain or pain under the shoulder blades  Painful or persistently difficult swallowing  New shortness of breath  Fever of 100F or higher  Black, tarry-looking stools  For urgent or emergent issues, a gastroenterologist can be reached at any hour by calling [phone]. Do not use MyChart messaging for urgent concerns.    DIET:  FOLLOW DILATION HANDOUT.  ACTIVITY:  You should plan to take it easy for the rest of today and you should NOT DRIVE or use heavy machinery until tomorrow (because of the sedation medicines used during the test).    FOLLOW UP: Our staff will call the number listed on your  records 48-72 hours following your procedure to check on you and address any questions or concerns that you may have regarding the information given to you following your procedure. If we do not reach you, we will leave a message.  We will attempt to reach you two times.  During this call, we will ask if you have developed any symptoms of COVID 19. If you develop any symptoms (ie: fever, flu-like symptoms, shortness of breath, cough etc.) before then, please call [phone].  If you test positive for Covid 19 in the 2 weeks post procedure, please call and report this information to [place].    If any biopsies were taken you will be contacted by phone or by letter within the next 1-3 weeks.  Please call us at [phone] if you have not heard about the biopsies in 3 weeks.    SIGNATURES/CONFIDENTIALITY: You and/or your care partner have signed paperwork which will be entered into your electronic medical record.  These signatures attest to the fact that that the information above on your After Visit Summary has been reviewed and is understood.  Full responsibility of the confidentiality of this discharge information lies with you and/or your care-partner.   Resume medications. Information given on dilation diet and stricture.

## 2020-02-19 NOTE — Progress Notes (Signed)
VS- Nash Mantis Temp-Lisa Clapps  Pt had second dose of covid vaccine 01-15-20  Pt's states no medical or surgical changes since previsit or office visit.

## 2020-02-19 NOTE — Progress Notes (Signed)
To PACU, VSS. Report to [email or web address] 

## 2020-02-19 NOTE — Progress Notes (Signed)
Called to room to assist during endoscopic procedure.  Patient ID and intended procedure confirmed with present staff. Received instructions for my participation in the procedure from the performing physician.  

## 2020-02-21 ENCOUNTER — Telehealth: Payer: Self-pay

## 2020-02-21 NOTE — Telephone Encounter (Signed)
  Follow up Call-  Call back number 02/19/2020 02/20/2018  Post procedure Call Back phone  # [phone]  Permission to leave phone message Yes Yes  Some recent data might be hidden     Patient questions:  Do you have a fever, pain , or abdominal swelling? No. Pain Score  0 *  Have you tolerated food without any problems? Yes.    Have you been able to return to your normal activities? Yes.    Do you have any questions about your discharge instructions: Diet   No. Medications  No. Follow up visit  No.  Do you have questions or concerns about your Care? No.  Actions: * If pain score is 4 or above: No action needed, pain <4.  1. Have you developed a fever since your procedure? no  2.   Have you had an respiratory symptoms (SOB or cough) since your procedure? no  3.   Have you tested positive for COVID 19 since your procedure no  4.   Have you had any family members/close contacts diagnosed with the COVID 19 since your procedure?  no   If yes to any of these questions please route to Joylene John, RN and Alphonsa Gin, Therapist, sports.

## 2020-02-24 ENCOUNTER — Encounter: Payer: Self-pay | Admitting: Internal Medicine

## 2020-03-05 DIAGNOSIS — N139 Obstructive and reflux uropathy, unspecified: Secondary | ICD-10-CM | POA: Diagnosis not present

## 2020-03-05 DIAGNOSIS — N312 Flaccid neuropathic bladder, not elsewhere classified: Secondary | ICD-10-CM | POA: Diagnosis not present

## 2020-03-05 DIAGNOSIS — R3914 Feeling of incomplete bladder emptying: Secondary | ICD-10-CM | POA: Diagnosis not present

## 2020-03-10 DIAGNOSIS — E7849 Other hyperlipidemia: Secondary | ICD-10-CM | POA: Diagnosis not present

## 2020-03-10 DIAGNOSIS — Z125 Encounter for screening for malignant neoplasm of prostate: Secondary | ICD-10-CM | POA: Diagnosis not present

## 2020-03-17 DIAGNOSIS — I2510 Atherosclerotic heart disease of native coronary artery without angina pectoris: Secondary | ICD-10-CM | POA: Diagnosis not present

## 2020-03-17 DIAGNOSIS — M545 Low back pain: Secondary | ICD-10-CM | POA: Diagnosis not present

## 2020-03-17 DIAGNOSIS — D696 Thrombocytopenia, unspecified: Secondary | ICD-10-CM | POA: Diagnosis not present

## 2020-03-17 DIAGNOSIS — N319 Neuromuscular dysfunction of bladder, unspecified: Secondary | ICD-10-CM | POA: Diagnosis not present

## 2020-03-17 DIAGNOSIS — I872 Venous insufficiency (chronic) (peripheral): Secondary | ICD-10-CM | POA: Diagnosis not present

## 2020-03-17 DIAGNOSIS — Z0000 Encounter for general adult medical examination without abnormal findings: Secondary | ICD-10-CM | POA: Diagnosis not present

## 2020-03-17 DIAGNOSIS — Z1331 Encounter for screening for depression: Secondary | ICD-10-CM | POA: Diagnosis not present

## 2020-03-17 DIAGNOSIS — K222 Esophageal obstruction: Secondary | ICD-10-CM | POA: Diagnosis not present

## 2020-03-17 DIAGNOSIS — G629 Polyneuropathy, unspecified: Secondary | ICD-10-CM | POA: Diagnosis not present

## 2020-03-17 DIAGNOSIS — I10 Essential (primary) hypertension: Secondary | ICD-10-CM | POA: Diagnosis not present

## 2020-03-17 DIAGNOSIS — E785 Hyperlipidemia, unspecified: Secondary | ICD-10-CM | POA: Diagnosis not present

## 2020-03-17 DIAGNOSIS — R82998 Other abnormal findings in urine: Secondary | ICD-10-CM | POA: Diagnosis not present

## 2020-03-17 DIAGNOSIS — Z1339 Encounter for screening examination for other mental health and behavioral disorders: Secondary | ICD-10-CM | POA: Diagnosis not present

## 2020-04-04 DIAGNOSIS — R3914 Feeling of incomplete bladder emptying: Secondary | ICD-10-CM | POA: Diagnosis not present

## 2020-04-04 DIAGNOSIS — N312 Flaccid neuropathic bladder, not elsewhere classified: Secondary | ICD-10-CM | POA: Diagnosis not present

## 2020-04-04 DIAGNOSIS — N139 Obstructive and reflux uropathy, unspecified: Secondary | ICD-10-CM | POA: Diagnosis not present

## 2020-04-29 DIAGNOSIS — C44729 Squamous cell carcinoma of skin of left lower limb, including hip: Secondary | ICD-10-CM | POA: Diagnosis not present

## 2020-04-29 DIAGNOSIS — C44529 Squamous cell carcinoma of skin of other part of trunk: Secondary | ICD-10-CM | POA: Diagnosis not present

## 2020-04-29 DIAGNOSIS — Z85828 Personal history of other malignant neoplasm of skin: Secondary | ICD-10-CM | POA: Diagnosis not present

## 2020-04-29 DIAGNOSIS — D485 Neoplasm of uncertain behavior of skin: Secondary | ICD-10-CM | POA: Diagnosis not present

## 2020-05-06 DIAGNOSIS — N312 Flaccid neuropathic bladder, not elsewhere classified: Secondary | ICD-10-CM | POA: Diagnosis not present

## 2020-05-06 DIAGNOSIS — R3914 Feeling of incomplete bladder emptying: Secondary | ICD-10-CM | POA: Diagnosis not present

## 2020-05-06 DIAGNOSIS — N139 Obstructive and reflux uropathy, unspecified: Secondary | ICD-10-CM | POA: Diagnosis not present

## 2020-05-08 ENCOUNTER — Encounter: Payer: Self-pay | Admitting: Physician Assistant

## 2020-05-08 ENCOUNTER — Other Ambulatory Visit: Payer: Self-pay

## 2020-05-08 ENCOUNTER — Ambulatory Visit: Payer: PPO | Admitting: Physician Assistant

## 2020-05-08 VITALS — BP 120/76 | HR 67 | Ht 71.0 in | Wt 200.0 lb

## 2020-05-08 DIAGNOSIS — G609 Hereditary and idiopathic neuropathy, unspecified: Secondary | ICD-10-CM

## 2020-05-08 DIAGNOSIS — E782 Mixed hyperlipidemia: Secondary | ICD-10-CM | POA: Diagnosis not present

## 2020-05-08 DIAGNOSIS — I2510 Atherosclerotic heart disease of native coronary artery without angina pectoris: Secondary | ICD-10-CM | POA: Diagnosis not present

## 2020-05-08 DIAGNOSIS — I428 Other cardiomyopathies: Secondary | ICD-10-CM | POA: Diagnosis not present

## 2020-05-08 NOTE — Progress Notes (Signed)
Cardiology Office Note    Date:  05/08/2020   ID:  David Gutierrez, DOB 26-Dec-1932, MRN [id number]  PCP:  Leanna Battles, MD  Cardiologist:  Dr. Harrington Challenger  Chief Complaint: 8 Months follow up  History of Present Illness:   David Gutierrez is a 86 y.o. male with history of nonobstructive coronary artery disease, nonischemic cardiomyopathy with improved LV function, chronic left bundle branch block, GERD with esophageal stricture/dysphagia (followed by Dr. Henrene Pastor) and hyperlipidemia presents for follow-up.  Echocardiogram 10/24/2012 with LV function of 40%, mild mitral regurgitation. Cardiac catheterization in 2013 showed minimal noncompressive CAD. Last echocardiogram January 2017 showed LV function of 55 to 73%, grade 1 diastolic dysfunction and mild mitral regurgitation.  Here today for follow-up.  He has history of alcohol induced peripheral neuropathy and followed by neurologist.  He uses foot and leg circulation machine every day.  On supplement for vitamin B1 deficiency.  No regular exercise but walks around with walker and now prescribed cane by neurologist.  No chest pain, shortness of breath, orthopnea, PND, syncope, lower extremity edema or melena.  No pain in his legs with ambulation.  Past Medical History:  Diagnosis Date   Alcoholic peripheral neuropathy [facility])    neurologist-  dr patel--- feet numbness and sensory ataxia   Arthritis    B12 deficiency    BPH (benign prostatic hyperplasia)    CAD (coronary artery disease) cardiolgoist-  dr Harrell Gave end (previouly dr dalton [name])   a. Myoview 11/13:  EF 38%, inf and IS defect c/w scar but no ischemia:  b. Cardiac CT 11/13:  Ca score 318 Agatson units, pLAD and dCFX plaque;   c. LHC 11/07/12:  pLAD 30%, oD1 40%, oCFX 30%, dCFX 40-50%, mRCA 40%, EF 50% (frequent PVCs and short run of NSVT with injection/    per last echo 01/ 2017  ef 55-60%   Chronic constipation    Diverticulosis of colon    Esophageal reflux     External hemorrhoids    First degree heart block    Hiatal hernia    History of adenomatous polyp of colon    History of esophageal stricture 08/11/2015   s/p  dilatation   History of lower GI bleeding 03/01/2017   s/p  flexiable simoidscopy w/ clipping rectum ulcer   Hypogonadism male    Hypothyroidism    Interstitial cystitis    Irritable bowel syndrome    LBBB (left bundle branch block)    NICM (nonischemic cardiomyopathy) [facility]) cardiologist-  dr Harrell Gave end (previously dr dalton [name])--  per last echo 01/ 2017  ef 55-60%   ? 2/2 LBBB => a. echo 11/13: diff HK, worse in septum and apex, mod LVE, mild LVH, EF 40%, mild AI, mild MR, mod LAE   Pre-diabetes    Self-catheterizes urinary bladder    bid to tid    Wears glasses    Wears hearing aid in both ears     Past Surgical History:  Procedure Laterality Date   CARDIAC CATHETERIZATION  11-07-2012    dr [name]   nonobstructive CAD (pLAD 30%, ostial D1 40%, ostial LCx 30%, dLCx diffuse 40-50%, mRCA 40%) ;  LVSF 50% but diffiult due to PVCs and short run VT with injection;  cardiomyopathy mostly likely a LBBB cardiomyopathy   CARDIOVASCULAR STRESS TEST  10-16-2012   dr [name]   Low risk adenosine nuclear study (no exercise) w/ a fixed inferior and inferoseptal defect without ischemia (question as whether this abnormality due  to LBBB cardiomyopathy or due to scar)/  LV ef 38%,  LV wall motion decreased of the septum and entire apex   CATARACT EXTRACTION W/ INTRAOCULAR LENS  IMPLANT, BILATERAL  2012  approx.   COLONOSCOPY     CYSTO/  HYDRODISTENTION/  BLADDER BIOPSY  04-20-2005    dr Lawerance Bach  [facility]   ETHMOIDECTOMY Right 12/12/2016   Procedure: RIGHT ENDOSCOPIC ETHMOIDECTOMY;  Surgeon: [name], MD;  Location: [place];  Service: ENT;  Laterality: Right;   FLEXIBLE SIGMOIDOSCOPY N/A 03/02/2017   Procedure: FLEXIBLE SIGMOIDOSCOPY;  Surgeon: Ladene Artist, MD;  Location: [facility];  Service:  Endoscopy;  Laterality: N/A;   LUMBAR LAMINECTOMY  1952   MAXILLARY ANTROSTOMY Right 12/12/2016   Procedure: RIGHT ENDOSCOPIC MAXILLARY ANTROSTOMY;  Surgeon: [name], MD;  Location: [place];  Service: ENT;  Laterality: Right;   SINUS ENDO W/FUSION Right 12/12/2016   Procedure: ENDOSCOPIC SINUS SURGERY WITH NAVIGATION;  Surgeon: [name], MD;  Location: [place];  Service: ENT;  Laterality: Right;   SPHENOIDECTOMY Right 12/12/2016   Procedure: RIGHT ENDOSCOPIC SPHENOIDECTOMY;  Surgeon: [name], MD;  Location: [place];  Service: ENT;  Laterality: Right;   TONSILLECTOMY AND ADENOIDECTOMY  child   TOTAL KNEE ARTHROPLASTY Left 10/14/2014   Procedure: LEFT TOTAL KNEE ARTHROPLASTY;  Surgeon: [name], MD;  Location: [facility];  Service: Orthopedics;  Laterality: Left;   TOTAL KNEE ARTHROPLASTY Right 1990s   TRANSTHORACIC ECHOCARDIOGRAM  12-24-2015   dr [name]   ef 55-60%,  grade 1 diastolic dysfunction/  trivial AR and TR  mild MR and PR/  moderate LAE   TRANSURETHRAL RESECTION OF PROSTATE  06-11-2010   dr Gaynelle Arabian  [facility]   w/  GYRUS   TRANSURETHRAL RESECTION OF PROSTATE N/A 12/06/2017   Procedure: TRANSURETHRAL RESECTION OF THE PROSTATE (TURP)/ BIPOLAR;  Surgeon: Ceasar Mons, MD;  Location[facility];  Service: Urology;  Laterality: N/A;   TRANSURETHRAL RESECTION OF PROSTATE N/A 04/11/2018   Procedure: TRANSURETHRAL RESECTION OF THE PROSTATE (TURP);  Surgeon: Ceasar Mons, MD;  Location: [facility];  Service: Urology;  Laterality: N/A;   UPPER GASTROINTESTINAL ENDOSCOPY      Current Medications:  Prior to Admission medications   Medication Sig Start Date End Date Taking? Authorizing Provider  aspirin EC 81 MG tablet Take 81 mg by mouth daily.   Yes [provider]  AZO-CRANBERRY PO Take 2 tablets by mouth 2 (two) times daily.   Yes [provider]  b complex vitamins capsule Take  1 capsule by mouth daily.   Yes [provider]  carvedilol (COREG) 6.25 MG tablet TAKE 1 TABLET BY MOUTH TWICE A DAY 10/10/19  Yes Fay Records, MD  [employer] B-1 100 MG tablet Take 100 mg by mouth daily. 04/15/20  Yes [provider]  [employer] STOOL SOFTENER 100 MG capsule Take 100 mg by mouth as needed for constipation. 04/17/20  Yes [provider]  Cyanocobalamin (B-12) 1000 MCG LOZG Place 1 tablet under the tongue daily. 03/18/20  Yes [provider]  diazepam (VALIUM) 5 MG tablet Take 2.5 mg by mouth 2 (two) times daily.  04/16/14  Yes [provider]  diphenhydramine-acetaminophen (TYLENOL PM) 25-500 MG TABS tablet Take 1 tablet by mouth at bedtime.    Yes [provider]  levothyroxine (LEVOTHROID) 25 MCG tablet Take 25 mcg by mouth every evening.  12/28/15  Yes Loralie Champagne  S, MD  omeprazole (PRILOSEC) 20 MG capsule Take 1 capsule (20 mg total) by mouth daily. 01/15/20  Yes Irene Shipper, MD  polyethylene glycol [facility] Floria Raveling) packet Take 17 g by mouth daily as needed for mild constipation.   Yes [provider]  Probiotic Product (PROBIOTIC PO) Take 1 capsule by mouth daily.   Yes [provider]  rosuvastatin (CRESTOR) 5 MG tablet TAKE ONE TABLET DAILY 12/13/19  Yes End, Harrell Gave, MD  trimethoprim (TRIMPEX) 100 MG tablet Take 100 mg by mouth at bedtime. 09/17/18  Yes [provider]     Allergies:   Cyclobenzaprine, Lisinopril, Relafen [nabumetone], and Statins   Social History   Socioeconomic History   Marital status: Married    Spouse name: Not on file   Number of children: 3   Years of education: Not on file   Highest education level: Not on file  Occupational History   Occupation: retired  Tobacco Use   Smoking status: Former Smoker    Packs/day: 2.00    Years: 15.00    Pack years: 30.00    Types: Cigarettes    Quit date: 11/30/1967    Years since quitting: 52.4   Smokeless tobacco:  Never Used  Substance and Sexual Activity   Alcohol use: Yes    Comment: occasional   Drug use: No   Sexual activity: Not on file  Other Topics Concern   Not on file  Social History Narrative   3 caffinated drinks per day.     Lives with wife at [place].  Has 3 children.     Retired from [employer].  Education: BA from [place]. Right handed   Social Determinants of Health   Financial Resource Strain:    Difficulty of Paying Living Expenses:   Food Insecurity:    Worried About Charity fundraiser in the Last Year:    Arboriculturist in the Last Year:   Transportation Needs:    Film/video editor (Medical):    Lack of Transportation (Non-Medical):   Physical Activity:    Days of Exercise per Week:    Minutes of Exercise per Session:   Stress:    Feeling of Stress :   Social Connections:    Frequency of Communication with Friends and Family:    Frequency of Social Gatherings with Friends and Family:    Attends Religious Services:    Active Member of Clubs or Organizations:    Attends Archivist Meetings:    Marital Status:      Family History:  The patient's family history includes Diabetes type I in his son; Healthy in his father, mother, sister, and sister; Heart disease in his brother; Other in his sister; Thyroid cancer in his brother.   ROS:   Please see the history of present illness.    ROS All other systems reviewed and are negative.   PHYSICAL EXAM:   VS:  BP 120/76    Pulse 67    Ht 5\' 11"  (1.803 m)    Wt 200 lb (90.7 kg)    SpO2 96%    BMI 27.89 kg/m    GEN: Well nourished, well developed, in no acute distress  HEENT: normal  Neck: no JVD, carotid bruits, or masses Cardiac: RRR; no murmurs, rubs, or gallops,no edema  Respiratory:  clear to auscultation bilaterally, normal work of breathing GI: soft, nontender, nondistended, + BS MS: no deformity or atrophy  Skin: warm and  dry, no rash Neuro:  Alert and  Oriented x 3, Strength and sensation are intact Psych: euthymic mood, full affect  Wt Readings from Last 3 Encounters:  05/08/20 200 lb (90.7 kg)  02/19/20 205 lb (93 kg)  02/05/20 205 lb (93 kg)      Studies/Labs Reviewed:   EKG:  EKG is not ordered today.    Recent Labs: No results found for requested labs within last 8760 hours.   Lipid Panel    Component Value Date/Time   CHOL 145 03/24/2016 0735   TRIG 92 03/24/2016 0735   HDL 46 03/24/2016 0735   CHOLHDL 3.2 03/24/2016 0735   VLDL 18 03/24/2016 0735   LDLCALC 81 03/24/2016 0735    Additional studies/ records that were reviewed today include:   Echocardiogram: 12/2015 Study Conclusions   - Left ventricle: The cavity size was normal. There was mild focal  basal hypertrophy of the septum. Systolic function was normal.  The estimated ejection fraction was in the range of 55% to 60%.  Wall motion was normal; there were no regional wall motion  abnormalities. Doppler parameters are consistent with abnormal  left ventricular relaxation (grade 1 diastolic dysfunction).  - Aortic valve: There was trivial regurgitation.  - Mitral valve: There was mild regurgitation.  - Left atrium: The atrium was moderately dilated.  - Right ventricle: The cavity size was normal. Wall thickness was  normal. Systolic function was normal.   Coronary angiography:11/2012 Coronary dominance: right  Left mainstem: No significant disease.   Left anterior descending (LAD): 30% proximal LAD stenosis.  40% ostial D1 stenosis.   Left circumflex (LCx): Moderate ramus with luminal irregularities.  30% ostial LCx.  Diffuse disease up to 40-50% in the distal LCx.   Right coronary artery (RCA): 40% mid RCA stenosis.    Left ventriculography: Left ventricular systolic function is probably mildly decreased, LVEF is estimated at 50% but difficult due to PVCs and short run VT with injection.   Final Conclusions:  Nonobstructive CAD.   I suspect that Mr Mikes cardiomyopathy is most likely a LBBB cardiomyopathy.  I have started him on Coreg and lisinopril.  He should continue ASA and atorvastatin due to significant, though nonobstructive, CAD.     ASSESSMENT & PLAN:    1. NICM - Last echo in 12/2015 showed normal LVEF.  No CHF symptoms. -Continue Coreg 6.25 mg twice daily   2. Non obstructive CAD by cath in 2013 -No angina -Continue aspirin 81 mg daily, statin and beta-blocker  3. HLD -Continue statin -Followed by PCP  4.  Alcohol induced peripheral neuropathy with numbness and gait imbalance -Followed by neurologist -He uses cane for ambulation now prescribed walker - No fall or syncope    Medication Adjustments/Labs and Tests Ordered: Current medicines are reviewed at length with the patient today.  Concerns regarding medicines are outlined above.  Medication changes, Labs and Tests ordered today are listed in the Patient Instructions below. Patient Instructions  Medication Instructions:  Your physician recommends that you continue on your current medications as directed. Please refer to the Current Medication list given to you today.  *If you need a refill on your cardiac medications before your next appointment, please call your pharmacy*   Lab Work: None ordered  If you have labs (blood work) drawn today and your tests are completely normal, you will receive your results only by:  [place] (if you have MyChart) OR  A paper copy in the mail If you have  any lab test that is abnormal or we need to change your treatment, we will call you to review the results.   Testing/Procedures: None ordered   Follow-Up: At [facility], you and your health needs are our priority.  As part of our continuing mission to provide you with exceptional heart care, we have created designated Provider Care Teams.  These Care Teams include your primary Cardiologist (physician) and Advanced Practice  Providers (APPs -  Physician Assistants and Nurse Practitioners) who all work together to provide you with the care you need, when you need it.  We recommend signing up for the patient portal called "MyChart".  Sign up information is provided on this After Visit Summary.  MyChart is used to connect with patients for Virtual Visits (Telemedicine).  Patients are able to view lab/test results, encounter notes, upcoming appointments, etc.  Non-urgent messages can be sent to your provider as well.   To learn more about what you can do with MyChart, go to [email or web address].    Your next appointment:   12 month(s)  The format for your next appointment:   In Person  Provider:   You may see Dorris Carnes, MD or one of the following Advanced Practice Providers on your designated Care Team:    Richardson Dopp, PA-C  Robbie Lis, PA-C    Other Instructions      Jarrett Soho, [place]  05/08/2020 12:06 PM    [address], [place], [place]  [id number] Phone: [phone]; Fax: [phone]

## 2020-05-08 NOTE — Patient Instructions (Signed)
Medication Instructions:  Your physician recommends that you continue on your current medications as directed. Please refer to the Current Medication list given to you today.  *If you need a refill on your cardiac medications before your next appointment, please call your pharmacy*   Lab Work: None ordered  If you have labs (blood work) drawn today and your tests are completely normal, you will receive your results only by: . MyChart Message (if you have MyChart) OR . A paper copy in the mail If you have any lab test that is abnormal or we need to change your treatment, we will call you to review the results.   Testing/Procedures: None ordered   Follow-Up: At [facility], you and your health needs are our priority.  As part of our continuing mission to provide you with exceptional heart care, we have created designated Provider Care Teams.  These Care Teams include your primary Cardiologist (physician) and Advanced Practice Providers (APPs -  Physician Assistants and Nurse Practitioners) who all work together to provide you with the care you need, when you need it.  We recommend signing up for the patient portal called "MyChart".  Sign up information is provided on this After Visit Summary.  MyChart is used to connect with patients for Virtual Visits (Telemedicine).  Patients are able to view lab/test results, encounter notes, upcoming appointments, etc.  Non-urgent messages can be sent to your provider as well.   To learn more about what you can do with MyChart, go to [email or web address].    Your next appointment:   12 month(s)  The format for your next appointment:   In Person  Provider:   You may see Paula Ross, MD or one of the following Advanced Practice Providers on your designated Care Team:    Scott Weaver, PA-C  Vin Bhagat, PA-C    Other Instructions   

## 2020-05-11 DIAGNOSIS — H524 Presbyopia: Secondary | ICD-10-CM | POA: Diagnosis not present

## 2020-05-11 DIAGNOSIS — Z961 Presence of intraocular lens: Secondary | ICD-10-CM | POA: Diagnosis not present

## 2020-05-15 DIAGNOSIS — N3021 Other chronic cystitis with hematuria: Secondary | ICD-10-CM | POA: Diagnosis not present

## 2020-05-15 DIAGNOSIS — R3914 Feeling of incomplete bladder emptying: Secondary | ICD-10-CM | POA: Diagnosis not present

## 2020-05-15 DIAGNOSIS — N312 Flaccid neuropathic bladder, not elsewhere classified: Secondary | ICD-10-CM | POA: Diagnosis not present

## 2020-06-04 DIAGNOSIS — N139 Obstructive and reflux uropathy, unspecified: Secondary | ICD-10-CM | POA: Diagnosis not present

## 2020-06-04 DIAGNOSIS — N312 Flaccid neuropathic bladder, not elsewhere classified: Secondary | ICD-10-CM | POA: Diagnosis not present

## 2020-06-04 DIAGNOSIS — R3914 Feeling of incomplete bladder emptying: Secondary | ICD-10-CM | POA: Diagnosis not present

## 2020-07-05 DIAGNOSIS — N139 Obstructive and reflux uropathy, unspecified: Secondary | ICD-10-CM | POA: Diagnosis not present

## 2020-07-05 DIAGNOSIS — R3914 Feeling of incomplete bladder emptying: Secondary | ICD-10-CM | POA: Diagnosis not present

## 2020-07-05 DIAGNOSIS — N312 Flaccid neuropathic bladder, not elsewhere classified: Secondary | ICD-10-CM | POA: Diagnosis not present

## 2020-07-07 DIAGNOSIS — N139 Obstructive and reflux uropathy, unspecified: Secondary | ICD-10-CM | POA: Diagnosis not present

## 2020-07-07 DIAGNOSIS — N312 Flaccid neuropathic bladder, not elsewhere classified: Secondary | ICD-10-CM | POA: Diagnosis not present

## 2020-07-07 DIAGNOSIS — R3914 Feeling of incomplete bladder emptying: Secondary | ICD-10-CM | POA: Diagnosis not present

## 2020-08-05 DIAGNOSIS — N139 Obstructive and reflux uropathy, unspecified: Secondary | ICD-10-CM | POA: Diagnosis not present

## 2020-08-05 DIAGNOSIS — R3914 Feeling of incomplete bladder emptying: Secondary | ICD-10-CM | POA: Diagnosis not present

## 2020-08-05 DIAGNOSIS — N312 Flaccid neuropathic bladder, not elsewhere classified: Secondary | ICD-10-CM | POA: Diagnosis not present

## 2020-08-11 DIAGNOSIS — N312 Flaccid neuropathic bladder, not elsewhere classified: Secondary | ICD-10-CM | POA: Diagnosis not present

## 2020-08-24 ENCOUNTER — Other Ambulatory Visit: Payer: Self-pay

## 2020-08-24 ENCOUNTER — Ambulatory Visit: Payer: PPO | Admitting: Neurology

## 2020-08-24 ENCOUNTER — Encounter: Payer: Self-pay | Admitting: Neurology

## 2020-08-24 VITALS — BP 133/82 | HR 71 | Resp 18 | Ht 70.0 in | Wt 201.0 lb

## 2020-08-24 DIAGNOSIS — G621 Alcoholic polyneuropathy: Secondary | ICD-10-CM | POA: Diagnosis not present

## 2020-08-24 DIAGNOSIS — R278 Other lack of coordination: Secondary | ICD-10-CM

## 2020-08-24 NOTE — Patient Instructions (Signed)
Start physical therapy at [facility] daily Continue to use walker/cane Return to clinic in 1 year

## 2020-08-24 NOTE — Progress Notes (Signed)
Follow-up Visit   Date: 08/24/20    David Gutierrez MRN: [id number] DOB: [birth year]   Interim History: David Gutierrez is a 86 y.o. right-handed Caucasian male with hypertension, CAD, GERD, hyperlipidemia, interstitial cystitis, s/p lumbar surgery (1952) and hypothyroidism returning to the clinic for follow-up of alcoholic neuropathy.  The patient was accompanied to the clinic by self.  History of present illness: A few years ago, he began noticing imbalance when climbing hills while playing golf.  He gave up playing golf late 2016 because of difficulty with balance.  He walk independently, but when walking his dog he takes a hiking stick which helps. He stays very active and had a trainer as well as classes that he attends for balance.   Several years ago, he also noticed that he has numbness involving the feet. He drinks about 4-5oz vodka and occasionally wine for the past 20 years.  No history of diabetes. He was diagnosed with alcohol-induced neuropathy.  He quit drinking alcohol for 1 year and noticed mild improvement in his feet paresthesias, but not enough for him to want to completely abstain from alcohol.  He has been using a foot massager with electrical stimulator, which provides some relief.  He was found to be vitamin B12 and B1 deficient and since taking supplements, his energy level has improved.   UPDATE 08/24/2020:  He is here for 1 year follow-up visit.  Over the past year, he has noticed mild progression of numbness in the legs and imbalance. He is unable to walk as far and tires easily. He uses a walker/cane as needed.  He suffered one fall while picking flowers outside and bruised his right foot.  He continues to drink (2) 2oz alcoholic beverages nightly.  No new complaints.    Medications:  Current Outpatient Medications on File Prior to Visit  Medication Sig Dispense Refill  . aspirin EC 81 MG tablet Take 81 mg by mouth daily.    . AZO-CRANBERRY PO Take 2 tablets by  mouth 2 (two) times daily.    Marland Kitchen b complex vitamins capsule Take 1 capsule by mouth daily.    . carvedilol (COREG) 6.25 MG tablet TAKE 1 TABLET BY MOUTH TWICE A DAY 180 tablet 3  . CVS B-1 100 MG tablet Take 100 mg by mouth daily.    . CVS STOOL SOFTENER 100 MG capsule Take 100 mg by mouth as needed for constipation.    . Cyanocobalamin (B-12) 1000 MCG LOZG Place 1 tablet under the tongue daily.    . diazepam (VALIUM) 5 MG tablet Take 2.5 mg by mouth 2 (two) times daily.     . diphenhydramine-acetaminophen (TYLENOL PM) 25-500 MG TABS tablet Take 1 tablet by mouth at bedtime.     Marland Kitchen levothyroxine (LEVOTHROID) 25 MCG tablet Take 25 mcg by mouth every evening.     Marland Kitchen omeprazole (PRILOSEC) 20 MG capsule Take 1 capsule (20 mg total) by mouth daily. 90 capsule 3  . polyethylene glycol (MIRALAX / GLYCOLAX) packet Take 17 g by mouth daily as needed for mild constipation.    . Probiotic Product (PROBIOTIC PO) Take 1 capsule by mouth daily.    . rosuvastatin (CRESTOR) 5 MG tablet TAKE ONE TABLET DAILY 90 tablet 3  . trimethoprim (TRIMPEX) 100 MG tablet Take 100 mg by mouth at bedtime.     Current Facility-Administered Medications on File Prior to Visit  Medication Dose Route Frequency Provider Last Rate Last Admin  . 0.9 %  sodium chloride infusion  500 mL Intravenous Once Irene Shipper, MD        Allergies:  Allergies  Allergen Reactions  . Cyclobenzaprine Other (See Comments)    Lost balance  . Lisinopril Other (See Comments)    Affected ability to urinate  . Relafen [Nabumetone] Other (See Comments)    Problems urinating afterwards   . Statins Other (See Comments)    "my peeing"    Vital Signs:  BP 133/82   Pulse 71   Resp 18   Ht 5\' 10"  (1.778 m)   Wt 201 lb (91.2 kg)   SpO2 98%   BMI 28.84 kg/m   Neurological Exam: MENTAL STATUS including orientation to time, place, person, recent and remote memory, attention span and concentration, language, and fund of knowledge is normal.   Speech is not dysarthric.  CRANIAL NERVES:  Pupils round and reactive to light.  Extraocular muscles intact.  Face is symmetric.  MOTOR:  Motor strength is 5/5 throughout including distally in the feet.  No pronator drift.  Tone is normal.    MSRs:  Right                                                                 Left brachioradialis 2+  brachioradialis 2+  biceps 2+  biceps 2+  triceps 2+  triceps 2+  patellar 1+  patellar 1+  ankle jerk 0  ankle jerk 0   SENSORY:  Vibration and light touch reduced distal to ankles bilaterally.  There is mild sway with Rhomberg testing.  COORDINATION/GAIT:   Gait wide-based, unassisted and somewhat unsteady.  LABS: Labs 03/02/2016:  TSH 3.02, Na 142, K 4.7, Glucose 94, BUN 14, Cr 0.7, LFTs normal Labs 05/06/2016:  Vitamin B12 242, folate 12.2, copper 97, SPEP with IFE no M protein  Lab Results  Component Value Date   VITAMINB12 421 05/08/2017    IMPRESSION: 1.  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  He abstained from alcohol for 1 year with no improvement, and restarted drinking alcohol (2) drinks nightly.  Clinically, there has been mild progression, as expected with disease course  - Start PT at [facility] daily. Avoid walking barefoot  - Continue to use cane/walker  - Limit alcohol  2. 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    3. Lumbar spondylosis, I suspect he may have some lumbar spinal canal stenosis which may be contributing to his gait difficulty.  He is clear that he does not want surgery and therefore MRI would be of low value, as it would not change management, which is physical therapy.    Return to clinic 1 year  Thank you for allowing me to participate in patient's care.  If I can answer any additional questions, I would be pleased to do so.    Sincerely,    Jule Whitsel K. Posey Pronto, DO

## 2020-09-04 DIAGNOSIS — N312 Flaccid neuropathic bladder, not elsewhere classified: Secondary | ICD-10-CM | POA: Diagnosis not present

## 2020-09-04 DIAGNOSIS — N139 Obstructive and reflux uropathy, unspecified: Secondary | ICD-10-CM | POA: Diagnosis not present

## 2020-09-04 DIAGNOSIS — R3914 Feeling of incomplete bladder emptying: Secondary | ICD-10-CM | POA: Diagnosis not present

## 2020-09-15 DIAGNOSIS — L820 Inflamed seborrheic keratosis: Secondary | ICD-10-CM | POA: Diagnosis not present

## 2020-09-15 DIAGNOSIS — Z85828 Personal history of other malignant neoplasm of skin: Secondary | ICD-10-CM | POA: Diagnosis not present

## 2020-09-15 DIAGNOSIS — L570 Actinic keratosis: Secondary | ICD-10-CM | POA: Diagnosis not present

## 2020-09-15 DIAGNOSIS — L720 Epidermal cyst: Secondary | ICD-10-CM | POA: Diagnosis not present

## 2020-09-15 DIAGNOSIS — L821 Other seborrheic keratosis: Secondary | ICD-10-CM | POA: Diagnosis not present

## 2020-10-02 ENCOUNTER — Other Ambulatory Visit: Payer: Self-pay | Admitting: Internal Medicine

## 2020-10-02 DIAGNOSIS — I428 Other cardiomyopathies: Secondary | ICD-10-CM

## 2020-10-02 DIAGNOSIS — I2510 Atherosclerotic heart disease of native coronary artery without angina pectoris: Secondary | ICD-10-CM

## 2020-10-05 DIAGNOSIS — R5383 Other fatigue: Secondary | ICD-10-CM | POA: Diagnosis not present

## 2020-10-05 DIAGNOSIS — I2510 Atherosclerotic heart disease of native coronary artery without angina pectoris: Secondary | ICD-10-CM | POA: Diagnosis not present

## 2020-10-05 DIAGNOSIS — E785 Hyperlipidemia, unspecified: Secondary | ICD-10-CM | POA: Diagnosis not present

## 2020-10-05 DIAGNOSIS — N3289 Other specified disorders of bladder: Secondary | ICD-10-CM | POA: Diagnosis not present

## 2020-10-05 DIAGNOSIS — I10 Essential (primary) hypertension: Secondary | ICD-10-CM | POA: Diagnosis not present

## 2020-10-05 DIAGNOSIS — N139 Obstructive and reflux uropathy, unspecified: Secondary | ICD-10-CM | POA: Diagnosis not present

## 2020-10-05 DIAGNOSIS — N312 Flaccid neuropathic bladder, not elsewhere classified: Secondary | ICD-10-CM | POA: Diagnosis not present

## 2020-10-05 DIAGNOSIS — K5904 Chronic idiopathic constipation: Secondary | ICD-10-CM | POA: Diagnosis not present

## 2020-10-05 DIAGNOSIS — R3914 Feeling of incomplete bladder emptying: Secondary | ICD-10-CM | POA: Diagnosis not present

## 2020-11-05 DIAGNOSIS — N312 Flaccid neuropathic bladder, not elsewhere classified: Secondary | ICD-10-CM | POA: Diagnosis not present

## 2020-11-05 DIAGNOSIS — R3914 Feeling of incomplete bladder emptying: Secondary | ICD-10-CM | POA: Diagnosis not present

## 2020-11-05 DIAGNOSIS — N139 Obstructive and reflux uropathy, unspecified: Secondary | ICD-10-CM | POA: Diagnosis not present

## 2020-11-10 DIAGNOSIS — R3914 Feeling of incomplete bladder emptying: Secondary | ICD-10-CM | POA: Diagnosis not present

## 2020-11-10 DIAGNOSIS — N139 Obstructive and reflux uropathy, unspecified: Secondary | ICD-10-CM | POA: Diagnosis not present

## 2020-11-10 DIAGNOSIS — N312 Flaccid neuropathic bladder, not elsewhere classified: Secondary | ICD-10-CM | POA: Diagnosis not present

## 2020-11-12 DIAGNOSIS — N312 Flaccid neuropathic bladder, not elsewhere classified: Secondary | ICD-10-CM | POA: Diagnosis not present

## 2020-12-07 ENCOUNTER — Other Ambulatory Visit: Payer: Self-pay | Admitting: *Deleted

## 2020-12-07 MED ORDER — ROSUVASTATIN CALCIUM 5 MG PO TABS: 5.0000 mg | ORAL_TABLET | Freq: Every day | ORAL | 3 refills | Status: DC

## 2020-12-09 MED ORDER — ROSUVASTATIN CALCIUM 5 MG PO TABS: 5.0000 mg | ORAL_TABLET | Freq: Every day | ORAL | 1 refills | Status: DC

## 2020-12-09 NOTE — Addendum Note (Signed)
Addended by: Margaret Pyle D on: 12/09/2020 09:34 AM   Modules accepted: Orders

## 2020-12-22 ENCOUNTER — Other Ambulatory Visit: Payer: Self-pay | Admitting: Internal Medicine

## 2021-02-02 DIAGNOSIS — N139 Obstructive and reflux uropathy, unspecified: Secondary | ICD-10-CM | POA: Diagnosis not present

## 2021-02-02 DIAGNOSIS — R3914 Feeling of incomplete bladder emptying: Secondary | ICD-10-CM | POA: Diagnosis not present

## 2021-02-02 DIAGNOSIS — N312 Flaccid neuropathic bladder, not elsewhere classified: Secondary | ICD-10-CM | POA: Diagnosis not present

## 2021-02-06 DIAGNOSIS — N139 Obstructive and reflux uropathy, unspecified: Secondary | ICD-10-CM | POA: Diagnosis not present

## 2021-02-06 DIAGNOSIS — R3914 Feeling of incomplete bladder emptying: Secondary | ICD-10-CM | POA: Diagnosis not present

## 2021-02-06 DIAGNOSIS — N312 Flaccid neuropathic bladder, not elsewhere classified: Secondary | ICD-10-CM | POA: Diagnosis not present

## 2021-03-06 DIAGNOSIS — N139 Obstructive and reflux uropathy, unspecified: Secondary | ICD-10-CM | POA: Diagnosis not present

## 2021-03-06 DIAGNOSIS — N312 Flaccid neuropathic bladder, not elsewhere classified: Secondary | ICD-10-CM | POA: Diagnosis not present

## 2021-03-06 DIAGNOSIS — R3914 Feeling of incomplete bladder emptying: Secondary | ICD-10-CM | POA: Diagnosis not present

## 2021-03-16 DIAGNOSIS — E785 Hyperlipidemia, unspecified: Secondary | ICD-10-CM | POA: Diagnosis not present

## 2021-03-16 DIAGNOSIS — Z125 Encounter for screening for malignant neoplasm of prostate: Secondary | ICD-10-CM | POA: Diagnosis not present

## 2021-03-23 DIAGNOSIS — F5104 Psychophysiologic insomnia: Secondary | ICD-10-CM | POA: Diagnosis not present

## 2021-03-23 DIAGNOSIS — M5136 Other intervertebral disc degeneration, lumbar region: Secondary | ICD-10-CM | POA: Diagnosis not present

## 2021-03-23 DIAGNOSIS — R82998 Other abnormal findings in urine: Secondary | ICD-10-CM | POA: Diagnosis not present

## 2021-03-23 DIAGNOSIS — K219 Gastro-esophageal reflux disease without esophagitis: Secondary | ICD-10-CM | POA: Diagnosis not present

## 2021-03-23 DIAGNOSIS — I2510 Atherosclerotic heart disease of native coronary artery without angina pectoris: Secondary | ICD-10-CM | POA: Diagnosis not present

## 2021-03-23 DIAGNOSIS — N3010 Interstitial cystitis (chronic) without hematuria: Secondary | ICD-10-CM | POA: Diagnosis not present

## 2021-03-23 DIAGNOSIS — I872 Venous insufficiency (chronic) (peripheral): Secondary | ICD-10-CM | POA: Diagnosis not present

## 2021-03-23 DIAGNOSIS — G629 Polyneuropathy, unspecified: Secondary | ICD-10-CM | POA: Diagnosis not present

## 2021-03-23 DIAGNOSIS — N319 Neuromuscular dysfunction of bladder, unspecified: Secondary | ICD-10-CM | POA: Diagnosis not present

## 2021-03-23 DIAGNOSIS — R42 Dizziness and giddiness: Secondary | ICD-10-CM | POA: Diagnosis not present

## 2021-03-23 DIAGNOSIS — Z0000 Encounter for general adult medical examination without abnormal findings: Secondary | ICD-10-CM | POA: Diagnosis not present

## 2021-03-23 DIAGNOSIS — D696 Thrombocytopenia, unspecified: Secondary | ICD-10-CM | POA: Diagnosis not present

## 2021-03-23 DIAGNOSIS — E785 Hyperlipidemia, unspecified: Secondary | ICD-10-CM | POA: Diagnosis not present

## 2021-04-04 DIAGNOSIS — R3914 Feeling of incomplete bladder emptying: Secondary | ICD-10-CM | POA: Diagnosis not present

## 2021-04-04 DIAGNOSIS — N312 Flaccid neuropathic bladder, not elsewhere classified: Secondary | ICD-10-CM | POA: Diagnosis not present

## 2021-04-04 DIAGNOSIS — N139 Obstructive and reflux uropathy, unspecified: Secondary | ICD-10-CM | POA: Diagnosis not present

## 2021-04-08 DIAGNOSIS — N3020 Other chronic cystitis without hematuria: Secondary | ICD-10-CM | POA: Diagnosis not present

## 2021-04-08 DIAGNOSIS — N312 Flaccid neuropathic bladder, not elsewhere classified: Secondary | ICD-10-CM | POA: Diagnosis not present

## 2021-04-29 DIAGNOSIS — G621 Alcoholic polyneuropathy: Secondary | ICD-10-CM | POA: Diagnosis not present

## 2021-04-29 DIAGNOSIS — I428 Other cardiomyopathies: Secondary | ICD-10-CM | POA: Diagnosis not present

## 2021-04-29 DIAGNOSIS — R278 Other lack of coordination: Secondary | ICD-10-CM | POA: Diagnosis not present

## 2021-04-29 DIAGNOSIS — R41840 Attention and concentration deficit: Secondary | ICD-10-CM | POA: Diagnosis not present

## 2021-04-29 DIAGNOSIS — M5459 Other low back pain: Secondary | ICD-10-CM | POA: Diagnosis not present

## 2021-04-29 DIAGNOSIS — M6389 Disorders of muscle in diseases classified elsewhere, multiple sites: Secondary | ICD-10-CM | POA: Diagnosis not present

## 2021-04-29 DIAGNOSIS — M5136 Other intervertebral disc degeneration, lumbar region: Secondary | ICD-10-CM | POA: Diagnosis not present

## 2021-05-05 DIAGNOSIS — R3914 Feeling of incomplete bladder emptying: Secondary | ICD-10-CM | POA: Diagnosis not present

## 2021-05-05 DIAGNOSIS — N312 Flaccid neuropathic bladder, not elsewhere classified: Secondary | ICD-10-CM | POA: Diagnosis not present

## 2021-05-05 DIAGNOSIS — N139 Obstructive and reflux uropathy, unspecified: Secondary | ICD-10-CM | POA: Diagnosis not present

## 2021-05-11 DIAGNOSIS — Z961 Presence of intraocular lens: Secondary | ICD-10-CM | POA: Diagnosis not present

## 2021-05-11 DIAGNOSIS — H524 Presbyopia: Secondary | ICD-10-CM | POA: Diagnosis not present

## 2021-05-13 DIAGNOSIS — R278 Other lack of coordination: Secondary | ICD-10-CM | POA: Diagnosis not present

## 2021-05-13 DIAGNOSIS — I428 Other cardiomyopathies: Secondary | ICD-10-CM | POA: Diagnosis not present

## 2021-05-13 DIAGNOSIS — M6389 Disorders of muscle in diseases classified elsewhere, multiple sites: Secondary | ICD-10-CM | POA: Diagnosis not present

## 2021-05-13 DIAGNOSIS — M5136 Other intervertebral disc degeneration, lumbar region: Secondary | ICD-10-CM | POA: Diagnosis not present

## 2021-05-13 DIAGNOSIS — M5459 Other low back pain: Secondary | ICD-10-CM | POA: Diagnosis not present

## 2021-05-13 DIAGNOSIS — G621 Alcoholic polyneuropathy: Secondary | ICD-10-CM | POA: Diagnosis not present

## 2021-05-13 DIAGNOSIS — R41840 Attention and concentration deficit: Secondary | ICD-10-CM | POA: Diagnosis not present

## 2021-05-20 DIAGNOSIS — R278 Other lack of coordination: Secondary | ICD-10-CM | POA: Diagnosis not present

## 2021-05-20 DIAGNOSIS — R41840 Attention and concentration deficit: Secondary | ICD-10-CM | POA: Diagnosis not present

## 2021-05-20 DIAGNOSIS — M6389 Disorders of muscle in diseases classified elsewhere, multiple sites: Secondary | ICD-10-CM | POA: Diagnosis not present

## 2021-05-20 DIAGNOSIS — I428 Other cardiomyopathies: Secondary | ICD-10-CM | POA: Diagnosis not present

## 2021-05-20 DIAGNOSIS — M5136 Other intervertebral disc degeneration, lumbar region: Secondary | ICD-10-CM | POA: Diagnosis not present

## 2021-05-20 DIAGNOSIS — M5459 Other low back pain: Secondary | ICD-10-CM | POA: Diagnosis not present

## 2021-05-20 DIAGNOSIS — G621 Alcoholic polyneuropathy: Secondary | ICD-10-CM | POA: Diagnosis not present

## 2021-06-04 DIAGNOSIS — N139 Obstructive and reflux uropathy, unspecified: Secondary | ICD-10-CM | POA: Diagnosis not present

## 2021-06-04 DIAGNOSIS — R3914 Feeling of incomplete bladder emptying: Secondary | ICD-10-CM | POA: Diagnosis not present

## 2021-06-04 DIAGNOSIS — N312 Flaccid neuropathic bladder, not elsewhere classified: Secondary | ICD-10-CM | POA: Diagnosis not present

## 2021-07-05 DIAGNOSIS — N312 Flaccid neuropathic bladder, not elsewhere classified: Secondary | ICD-10-CM | POA: Diagnosis not present

## 2021-07-06 DIAGNOSIS — N139 Obstructive and reflux uropathy, unspecified: Secondary | ICD-10-CM | POA: Diagnosis not present

## 2021-07-06 DIAGNOSIS — N312 Flaccid neuropathic bladder, not elsewhere classified: Secondary | ICD-10-CM | POA: Diagnosis not present

## 2021-07-06 DIAGNOSIS — R3914 Feeling of incomplete bladder emptying: Secondary | ICD-10-CM | POA: Diagnosis not present

## 2021-07-28 ENCOUNTER — Emergency Department (HOSPITAL_BASED_OUTPATIENT_CLINIC_OR_DEPARTMENT_OTHER): Payer: PPO

## 2021-07-28 ENCOUNTER — Emergency Department (HOSPITAL_BASED_OUTPATIENT_CLINIC_OR_DEPARTMENT_OTHER): Payer: PPO | Admitting: Radiology

## 2021-07-28 ENCOUNTER — Emergency Department (HOSPITAL_BASED_OUTPATIENT_CLINIC_OR_DEPARTMENT_OTHER)
Admission: EM | Admit: 2021-07-28 | Discharge: 2021-07-28 | Disposition: A | Discharge status: Home / Self Care | Payer: PPO | Attending: Emergency Medicine | Admitting: Emergency Medicine

## 2021-07-28 ENCOUNTER — Encounter (HOSPITAL_BASED_OUTPATIENT_CLINIC_OR_DEPARTMENT_OTHER): Payer: Self-pay | Admitting: Obstetrics and Gynecology

## 2021-07-28 DIAGNOSIS — R600 Localized edema: Secondary | ICD-10-CM | POA: Diagnosis not present

## 2021-07-28 DIAGNOSIS — M7989 Other specified soft tissue disorders: Secondary | ICD-10-CM | POA: Insufficient documentation

## 2021-07-28 DIAGNOSIS — M79671 Pain in right foot: Secondary | ICD-10-CM | POA: Insufficient documentation

## 2021-07-28 DIAGNOSIS — R609 Edema, unspecified: Secondary | ICD-10-CM

## 2021-07-28 DIAGNOSIS — Z7982 Long term (current) use of aspirin: Secondary | ICD-10-CM | POA: Insufficient documentation

## 2021-07-28 DIAGNOSIS — E039 Hypothyroidism, unspecified: Secondary | ICD-10-CM | POA: Diagnosis not present

## 2021-07-28 DIAGNOSIS — I2510 Atherosclerotic heart disease of native coronary artery without angina pectoris: Secondary | ICD-10-CM | POA: Diagnosis not present

## 2021-07-28 DIAGNOSIS — M79604 Pain in right leg: Secondary | ICD-10-CM | POA: Diagnosis not present

## 2021-07-28 DIAGNOSIS — L03115 Cellulitis of right lower limb: Secondary | ICD-10-CM | POA: Diagnosis not present

## 2021-07-28 DIAGNOSIS — Z79899 Other long term (current) drug therapy: Secondary | ICD-10-CM | POA: Insufficient documentation

## 2021-07-28 DIAGNOSIS — Z87891 Personal history of nicotine dependence: Secondary | ICD-10-CM | POA: Insufficient documentation

## 2021-07-28 DIAGNOSIS — L03818 Cellulitis of other sites: Secondary | ICD-10-CM

## 2021-07-28 DIAGNOSIS — M79672 Pain in left foot: Secondary | ICD-10-CM | POA: Diagnosis not present

## 2021-07-28 DIAGNOSIS — Z96653 Presence of artificial knee joint, bilateral: Secondary | ICD-10-CM | POA: Diagnosis not present

## 2021-07-28 LAB — CBC WITH DIFFERENTIAL/PLATELET
Abs Immature Granulocytes: 0.02 10*3/uL (ref 0.00–0.07)
Basophils Absolute: 0 10*3/uL (ref 0.0–0.1)
Basophils Relative: 0 %
Eosinophils Absolute: 0.1 10*3/uL (ref 0.0–0.5)
Eosinophils Relative: 1 %
HCT: 41.1 % (ref 39.0–52.0)
Hemoglobin: 13.2 g/dL (ref 13.0–17.0)
Immature Granulocytes: 0 %
Lymphocytes Relative: 27 %
Lymphs Abs: 2.1 10*3/uL (ref 0.7–4.0)
MCH: 30.8 pg (ref 26.0–34.0)
MCHC: 32.1 g/dL (ref 30.0–36.0)
MCV: 95.8 fL (ref 80.0–100.0)
Monocytes Absolute: 0.7 10*3/uL (ref 0.1–1.0)
Monocytes Relative: 10 %
Neutro Abs: 4.8 10*3/uL (ref 1.7–7.7)
Neutrophils Relative %: 62 %
Platelets: 124 10*3/uL — ABNORMAL LOW (ref 150–400)
RBC: 4.29 MIL/uL (ref 4.22–5.81)
RDW: 17.9 % — ABNORMAL HIGH (ref 11.5–15.5)
WBC: 7.7 10*3/uL (ref 4.0–10.5)
nRBC: 0 % (ref 0.0–0.2)

## 2021-07-28 LAB — SEDIMENTATION RATE: Sed Rate: 13 mm/hr (ref 0–16)

## 2021-07-28 LAB — C-REACTIVE PROTEIN: CRP: 3.1 mg/dL — ABNORMAL HIGH (ref <1.0)

## 2021-07-28 MED ORDER — CEPHALEXIN 250 MG PO CAPS
500.0000 mg | ORAL_CAPSULE | Freq: Once | ORAL | Status: AC
  Administered 2021-07-28: 500 mg via ORAL
  Filled 2021-07-28: qty 2

## 2021-07-28 MED ORDER — CEPHALEXIN 500 MG PO CAPS: 500.0000 mg | ORAL_CAPSULE | Freq: Four times a day (QID) | ORAL | 0 refills | Status: AC

## 2021-07-28 MED ORDER — ACETAMINOPHEN 325 MG PO TABS
650.0000 mg | ORAL_TABLET | Freq: Once | ORAL | Status: AC
  Administered 2021-07-28: 650 mg via ORAL
  Filled 2021-07-28: qty 2

## 2021-07-28 MED ORDER — ACETAMINOPHEN 325 MG PO TABS: 650.0000 mg | ORAL_TABLET | Freq: Four times a day (QID) | ORAL | 0 refills | Status: DC | PRN

## 2021-07-28 NOTE — ED Notes (Signed)
Patient transported to Ultrasound 

## 2021-07-28 NOTE — ED Provider Notes (Signed)
[facility] EMERGENCY DEPT Provider Note   CSN: [id number] Arrival date & time: 07/28/21  1436     History Chief Complaint  Patient presents with   Foot Pain    David Gutierrez is a 87 y.o. male.  Patient is a 86 yo male presenting for foot pain. Patient admits to pain and swelling of right foot x 1 day with difficulty bearing weight due to pain. Denies any recent trauma/injuries. Admits to hx of peripheral neuropathy in bilateral feet but no wounds present. Denies hx of previous DVT/PE, calf pain, prolonged immobilization, hormone use, or recent surgeries procedures. Denies prior hx of cellulitis.   The history is provided by the patient.  Foot Pain This is a new problem. Episode onset: 1 day. The problem occurs constantly. The problem has been gradually worsening. Pertinent negatives include no chest pain and no shortness of breath. Nothing relieves the symptoms.      Past Medical History:  Diagnosis Date   Alcoholic peripheral neuropathy [facility])    neurologist-  dr patel--- feet numbness and sensory ataxia   Arthritis    B12 deficiency    BPH (benign prostatic hyperplasia)    CAD (coronary artery disease) cardiolgoist-  dr Harrell Gave end (previouly dr dalton [name])   a. Myoview 11/13:  EF 38%, inf and IS defect c/w scar but no ischemia:  b. Cardiac CT 11/13:  Ca score 318 Agatson units, pLAD and dCFX plaque;   c. LHC 11/07/12:  pLAD 30%, oD1 40%, oCFX 30%, dCFX 40-50%, mRCA 40%, EF 50% (frequent PVCs and short run of NSVT with injection/    per last echo 01/ 2017  ef 55-60%   Chronic constipation    Diverticulosis of colon    Esophageal reflux    External hemorrhoids    First degree heart block    Hiatal hernia    History of adenomatous polyp of colon    History of esophageal stricture 08/11/2015   s/p  dilatation   History of lower GI bleeding 03/01/2017   s/p  flexiable simoidscopy w/ clipping rectum ulcer   Hypogonadism male    Hypothyroidism     Interstitial cystitis    Irritable bowel syndrome    LBBB (left bundle branch block)    NICM (nonischemic cardiomyopathy) [facility]) cardiologist-  dr Harrell Gave end (previously dr dalton [name])--  per last echo 01/ 2017  ef 55-60%   ? 2/2 LBBB => a. echo 11/13: diff HK, worse in septum and apex, mod LVE, mild LVH, EF 40%, mild AI, mild MR, mod LAE   Pre-diabetes    Self-catheterizes urinary bladder    bid to tid    Wears glasses    Wears hearing aid in both ears     Patient Active Problem List   Diagnosis Date Noted   Peripheral neuropathy, idiopathic 02/05/2020   BPH with urinary obstruction 12/06/2017   Rectal bleeding 03/02/2017   B12 deficiency 11/07/2016   S/P left TKA 10/14/2014   DJD (degenerative joint disease) of knee 10/14/2014   Hyperlipidemia 01/23/2013   Coronary artery disease involving native coronary artery of native heart without angina pectoris 11/20/2012   NICM (nonischemic cardiomyopathy) ([place]) 11/20/2012   LBBB (left bundle branch block) 10/09/2012   Chest pain 10/09/2012    Past Surgical History:  Procedure Laterality Date   CARDIAC CATHETERIZATION  11-07-2012    dr [name]   nonobstructive CAD (pLAD 30%, ostial D1 40%, ostial LCx 30%, dLCx diffuse 40-50%, mRCA 40%) ;  LVSF  50% but diffiult due to PVCs and short run VT with injection;  cardiomyopathy mostly likely a LBBB cardiomyopathy   CARDIOVASCULAR STRESS TEST  10-16-2012   dr [name]   Low risk adenosine nuclear study (no exercise) w/ a fixed inferior and inferoseptal defect without ischemia (question as whether this abnormality due to LBBB cardiomyopathy or due to scar)/  LV ef 38%,  LV wall motion decreased of the septum and entire apex   CATARACT EXTRACTION W/ INTRAOCULAR LENS  IMPLANT, BILATERAL  2012  approx.   COLONOSCOPY     CYSTO/  HYDRODISTENTION/  BLADDER BIOPSY  04-20-2005    dr Lawerance Bach  [facility]   ETHMOIDECTOMY Right 12/12/2016   Procedure: RIGHT ENDOSCOPIC ETHMOIDECTOMY;  Surgeon: [name], MD;   Location: [facility];  Service: ENT;  Laterality: Right;   FLEXIBLE SIGMOIDOSCOPY N/A 03/02/2017   Procedure: FLEXIBLE SIGMOIDOSCOPY;  Surgeon: Ladene Artist, MD;  Location: [facility];  Service: Endoscopy;  Laterality: N/A;   LUMBAR LAMINECTOMY  1952   MAXILLARY ANTROSTOMY Right 12/12/2016   Procedure: RIGHT ENDOSCOPIC MAXILLARY ANTROSTOMY;  Surgeon: [name], MD;  Location: [place];  Service: ENT;  Laterality: Right;   SINUS ENDO W/FUSION Right 12/12/2016   Procedure: ENDOSCOPIC SINUS SURGERY WITH NAVIGATION;  Surgeon: [name], MD;  Location: [place];  Service: ENT;  Laterality: Right;   SPHENOIDECTOMY Right 12/12/2016   Procedure: RIGHT ENDOSCOPIC SPHENOIDECTOMY;  Surgeon: [name], MD;  Location: [place];  Service: ENT;  Laterality: Right;   TONSILLECTOMY AND ADENOIDECTOMY  child   TOTAL KNEE ARTHROPLASTY Left 10/14/2014   Procedure: LEFT TOTAL KNEE ARTHROPLASTY;  Surgeon: [name], MD;  Location: [facility];  Service: Orthopedics;  Laterality: Left;   TOTAL KNEE ARTHROPLASTY Right 1990s   TRANSTHORACIC ECHOCARDIOGRAM  12-24-2015   dr [name]   ef 55-60%,  grade 1 diastolic dysfunction/  trivial AR and TR  mild MR and PR/  moderate LAE   TRANSURETHRAL RESECTION OF PROSTATE  06-11-2010   dr Gaynelle Arabian  [facility]   w/  GYRUS   TRANSURETHRAL RESECTION OF PROSTATE N/A 12/06/2017   Procedure: TRANSURETHRAL RESECTION OF THE PROSTATE (TURP)/ BIPOLAR;  Surgeon: Ceasar Mons, MD;  Location: [facility];  Service: Urology;  Laterality: N/A;   TRANSURETHRAL RESECTION OF PROSTATE N/A 04/11/2018   Procedure: TRANSURETHRAL RESECTION OF THE PROSTATE (TURP);  Surgeon: Ceasar Mons, MD;  Location: [facility];  Service: Urology;  Laterality: N/A;   UPPER GASTROINTESTINAL ENDOSCOPY         Family History  Problem Relation Age of Onset   Thyroid cancer Brother    Healthy Mother    Healthy Father    Other  Sister    Heart disease Brother    Healthy Sister    Healthy Sister    Diabetes type I Son    Colon cancer Neg Hx    Esophageal cancer Neg Hx    Stomach cancer Neg Hx    Rectal cancer Neg Hx     Social History   Tobacco Use   Smoking status: Former    Packs/day: 2.00    Years: 15.00    Pack years: 30.00    Types: Cigarettes    Quit date: 11/30/1967    Years since quitting: 53.6   Smokeless tobacco: Never  Vaping Use   Vaping Use: Never used  Substance Use Topics   Alcohol use: Yes    Comment: occasional  Drug use: No    Home Medications Prior to Admission medications   Medication Sig Start Date End Date Taking? Authorizing Provider  aspirin EC 81 MG tablet Take 81 mg by mouth daily.    [provider]  AZO-CRANBERRY PO Take 2 tablets by mouth 2 (two) times daily.    [provider]  b complex vitamins capsule Take 1 capsule by mouth daily.    [provider]  carvedilol (COREG) 6.25 MG tablet TAKE 1 TABLET BY MOUTH TWICE A DAY 10/02/20   Fay Records, MD  CVS B-1 100 MG tablet Take 100 mg by mouth daily. 04/15/20   [provider]  CVS STOOL SOFTENER 100 MG capsule Take 100 mg by mouth as needed for constipation. 04/17/20   [provider]  Cyanocobalamin (B-12) 1000 MCG LOZG Place 1 tablet under the tongue daily. 03/18/20   [provider]  diazepam (VALIUM) 5 MG tablet Take 2.5 mg by mouth 2 (two) times daily.  04/16/14   [provider]  diphenhydramine-acetaminophen (TYLENOL PM) 25-500 MG TABS tablet Take 1 tablet by mouth at bedtime.     [provider]  levothyroxine (LEVOTHROID) 25 MCG tablet Take 25 mcg by mouth every evening.  12/28/15   Larey Dresser, MD  omeprazole (PRILOSEC) 20 MG capsule TAKE 1 CAPSULE BY MOUTH EVERY DAY 12/22/20   Irene Shipper, MD  polyethylene glycol [facility] Floria Raveling) packet Take 17 g by mouth daily as needed for mild constipation.    [provider]   Probiotic Product (PROBIOTIC PO) Take 1 capsule by mouth daily.    [provider]  rosuvastatin (CRESTOR) 5 MG tablet Take 1 tablet (5 mg total) by mouth daily. 12/09/20   Fay Records, MD  trimethoprim (TRIMPEX) 100 MG tablet Take 100 mg by mouth at bedtime. 09/17/18   [provider]    Allergies    Cyclobenzaprine, Lisinopril, Relafen [nabumetone], and Statins  Review of Systems   Review of Systems  Constitutional:  Negative for chills and fever.  Respiratory:  Negative for chest tightness and shortness of breath.   Cardiovascular:  Negative for chest pain and palpitations.  Skin:  Positive for color change. Negative for wound.  Neurological:  Negative for weakness and numbness.   Physical Exam Updated Vital Signs BP 121/70 (BP Location: Right Arm)   Pulse 84   Temp 98.2 F (36.8 C)   Resp 16   SpO2 95%   Physical Exam Vitals and nursing note reviewed.  Constitutional:      Appearance: Normal appearance.  HENT:     Head: Normocephalic and atraumatic.  Cardiovascular:     Rate and Rhythm: Normal rate and regular rhythm.     Pulses:          Dorsalis pedis pulses are 2+ on the right side and 2+ on the left side.  Pulmonary:     Effort: Pulmonary effort is normal. No respiratory distress.  Musculoskeletal:       Feet:  Skin:    Capillary Refill: Capillary refill takes less than 2 seconds.  Neurological:     General: No focal deficit present.     Mental Status: He is alert and oriented to person, place, and time.     GCS: GCS eye subscore is 4. GCS verbal subscore is 5. GCS motor subscore is 6.     Sensory: Sensation is intact.     Motor: Motor function is intact.  ED Results / Procedures / Treatments   Labs (all labs ordered are listed, but only abnormal results are displayed) Labs Reviewed - No data to display  EKG None  Radiology DG Foot Complete Right  Result Date: 07/28/2021 CLINICAL DATA:  Bilateral foot pain EXAM: RIGHT FOOT  COMPLETE - 3+ VIEW COMPARISON:  None. FINDINGS: No acute bony abnormality. Specifically, no fracture, subluxation, or dislocation. Degenerative changes throughout the foot, most notable in the midfoot. Vascular and soft tissue callus crash that soft tissues are intact. IMPRESSION: No acute bony abnormality. Electronically Signed   By: Rolm Baptise M.D.   On: 07/28/2021 17:14    Procedures Procedures   Medications Ordered in ED Medications  acetaminophen (TYLENOL) tablet 650 mg (has no administration in time range)  cephALEXin (KEFLEX) capsule 500 mg (has no administration in time range)    ED Course  I have reviewed the triage vital signs and the nursing notes.  Pertinent labs & imaging results that were available during my care of the patient were reviewed by me and considered in my medical decision making (see chart for details).    MDM Rules/Calculators/A&P                          5:39 PM  87 yo male presenting for foot pain. Pt is Aox3, no acute distress, afebrile, non toxic appearing, with stable vitals. Physical exam demonstrates erythema, swelling, warmth, and redness of right foot concerning for cellulitis. No wounds, lacs, abrasions, or broken skin visualized. No bony tenderness. Xray demonstrates no osteomyelitis. Vascular US demonstrates no DVT. DP +2 equal bilaterally.  Keflex given in ED and prescription sent to pharmacy.   Patient in no distress and overall condition improved here in the ED. Detailed discussions were had with the patient regarding current findings, and need for close f/u with PCP or on call doctor. The patient has been instructed to return immediately if the symptoms worsen in any way for re-evaluation. Patient verbalized understanding and is in agreement with current care plan. All questions answered prior to discharge.  Final Clinical Impression(s) / ED Diagnoses Final diagnoses:  None    Rx / DC Orders ED Discharge Orders     None         Lianne Cure, DO [id number]

## 2021-07-28 NOTE — ED Triage Notes (Signed)
Patient reports to the ER for weakness. Patient reports he has neuropathy and last night he went to the bathroom and could not put weight on the right foot due to pain. Patient reports he is not on gabapentin. Patient denies being a diabetic.

## 2021-07-28 NOTE — ED Notes (Signed)
Pedal Pulse palpable in foot, appears to be +1 pitting edema right lower extrremity.Pt reports pain only when standing

## 2021-07-31 ENCOUNTER — Other Ambulatory Visit: Payer: Self-pay

## 2021-07-31 ENCOUNTER — Encounter (HOSPITAL_BASED_OUTPATIENT_CLINIC_OR_DEPARTMENT_OTHER): Payer: Self-pay

## 2021-07-31 ENCOUNTER — Emergency Department (HOSPITAL_BASED_OUTPATIENT_CLINIC_OR_DEPARTMENT_OTHER)
Admission: EM | Admit: 2021-07-31 | Discharge: 2021-07-31 | Disposition: A | Discharge status: Home / Self Care | Payer: PPO | Attending: Emergency Medicine | Admitting: Emergency Medicine

## 2021-07-31 DIAGNOSIS — Z87891 Personal history of nicotine dependence: Secondary | ICD-10-CM | POA: Diagnosis not present

## 2021-07-31 DIAGNOSIS — Z7982 Long term (current) use of aspirin: Secondary | ICD-10-CM | POA: Diagnosis not present

## 2021-07-31 DIAGNOSIS — L03115 Cellulitis of right lower limb: Secondary | ICD-10-CM | POA: Insufficient documentation

## 2021-07-31 DIAGNOSIS — E039 Hypothyroidism, unspecified: Secondary | ICD-10-CM | POA: Diagnosis not present

## 2021-07-31 DIAGNOSIS — Z86010 Personal history of colonic polyps: Secondary | ICD-10-CM | POA: Diagnosis not present

## 2021-07-31 DIAGNOSIS — Z79899 Other long term (current) drug therapy: Secondary | ICD-10-CM | POA: Insufficient documentation

## 2021-07-31 DIAGNOSIS — Z96653 Presence of artificial knee joint, bilateral: Secondary | ICD-10-CM | POA: Diagnosis not present

## 2021-07-31 DIAGNOSIS — I2510 Atherosclerotic heart disease of native coronary artery without angina pectoris: Secondary | ICD-10-CM | POA: Insufficient documentation

## 2021-07-31 DIAGNOSIS — M79661 Pain in right lower leg: Secondary | ICD-10-CM | POA: Diagnosis present

## 2021-07-31 NOTE — Discharge Instructions (Addendum)
Continue the antibiotics.  Follow-up with Dr. Philip Aspen as planned.

## 2021-07-31 NOTE — ED Triage Notes (Signed)
Patient seen here on 8/24 and dx'd with cellulitis, patient reports he has f/u on Monday with PCP and the pain is better but the swelling has increased and he just wanted to get it looked at again.

## 2021-07-31 NOTE — ED Provider Notes (Signed)
[place] EMERGENCY DEPT Provider Note   CSN: [id number] Arrival date & time: 07/31/21  1629     History Chief Complaint  Patient presents with   Leg Pain    David Gutierrez is a 87 y.o. male.   Leg Pain Associated symptoms: no fever   Patient presents for recheck of his right lower leg.  Was seen 3 days ago.  Had swelling of his legs since a couple days before that.  Thinks it could have come from a pedicure that he had.  Had redness and some mild swelling.  States the leg feels much better than it did before.  States the redness is improved some but states the leg is more swollen than before.  Has been taking the Keflex.  No chest pain or trouble breathing.  No fevers.  States that he has follow-up on Monday with Dr. Sharlett Iles.  No swelling the other leg.    Past Medical History:  Diagnosis Date   Alcoholic peripheral neuropathy [facility])    neurologist-  dr patel--- feet numbness and sensory ataxia   Arthritis    B12 deficiency    BPH (benign prostatic hyperplasia)    CAD (coronary artery disease) cardiolgoist-  dr Harrell Gave end (previouly dr dalton [name])   a. Myoview 11/13:  EF 38%, inf and IS defect c/w scar but no ischemia:  b. Cardiac CT 11/13:  Ca score 318 Agatson units, pLAD and dCFX plaque;   c. LHC 11/07/12:  pLAD 30%, oD1 40%, oCFX 30%, dCFX 40-50%, mRCA 40%, EF 50% (frequent PVCs and short run of NSVT with injection/    per last echo 01/ 2017  ef 55-60%   Chronic constipation    Diverticulosis of colon    Esophageal reflux    External hemorrhoids    First degree heart block    Hiatal hernia    History of adenomatous polyp of colon    History of esophageal stricture 08/11/2015   s/p  dilatation   History of lower GI bleeding 03/01/2017   s/p  flexiable simoidscopy w/ clipping rectum ulcer   Hypogonadism male    Hypothyroidism    Interstitial cystitis    Irritable bowel syndrome    LBBB (left bundle branch block)    NICM (nonischemic  cardiomyopathy) [facility]) cardiologist-  dr Harrell Gave end (previously dr dalton [name])--  per last echo 01/ 2017  ef 55-60%   ? 2/2 LBBB => a. echo 11/13: diff HK, worse in septum and apex, mod LVE, mild LVH, EF 40%, mild AI, mild MR, mod LAE   Pre-diabetes    Self-catheterizes urinary bladder    bid to tid    Wears glasses    Wears hearing aid in both ears     Patient Active Problem List   Diagnosis Date Noted   Peripheral neuropathy, idiopathic 02/05/2020   BPH with urinary obstruction 12/06/2017   Rectal bleeding 03/02/2017   B12 deficiency 11/07/2016   S/P left TKA 10/14/2014   DJD (degenerative joint disease) of knee 10/14/2014   Hyperlipidemia 01/23/2013   Coronary artery disease involving native coronary artery of native heart without angina pectoris 11/20/2012   NICM (nonischemic cardiomyopathy) ([place]) 11/20/2012   LBBB (left bundle branch block) 10/09/2012   Chest pain 10/09/2012    Past Surgical History:  Procedure Laterality Date   CARDIAC CATHETERIZATION  11-07-2012    dr [name]   nonobstructive CAD (pLAD 30%, ostial D1 40%, ostial LCx 30%, dLCx diffuse 40-50%, mRCA 40%) ;  LVSF 50% but diffiult due to PVCs and short run VT with injection;  cardiomyopathy mostly likely a LBBB cardiomyopathy   CARDIOVASCULAR STRESS TEST  10-16-2012   dr [name]   Low risk adenosine nuclear study (no exercise) w/ a fixed inferior and inferoseptal defect without ischemia (question as whether this abnormality due to LBBB cardiomyopathy or due to scar)/  LV ef 38%,  LV wall motion decreased of the septum and entire apex   CATARACT EXTRACTION W/ INTRAOCULAR LENS  IMPLANT, BILATERAL  2012  approx.   COLONOSCOPY     CYSTO/  HYDRODISTENTION/  BLADDER BIOPSY  04-20-2005    dr Lawerance Bach  [facility]   ETHMOIDECTOMY Right 12/12/2016   Procedure: RIGHT ENDOSCOPIC ETHMOIDECTOMY;  Surgeon: [name], MD;  Location: [place];  Service: ENT;  Laterality: Right;   FLEXIBLE SIGMOIDOSCOPY N/A 03/02/2017    Procedure: FLEXIBLE SIGMOIDOSCOPY;  Surgeon: Ladene Artist, MD;  Location: [facility];  Service: Endoscopy;  Laterality: N/A;   LUMBAR LAMINECTOMY  1952   MAXILLARY ANTROSTOMY Right 12/12/2016   Procedure: RIGHT ENDOSCOPIC MAXILLARY ANTROSTOMY;  Surgeon: [name], MD;  Location: [place];  Service: ENT;  Laterality: Right;   SINUS ENDO W/FUSION Right 12/12/2016   Procedure: ENDOSCOPIC SINUS SURGERY WITH NAVIGATION;  Surgeon: [name], MD;  Location: [place];  Service: ENT;  Laterality: Right;   SPHENOIDECTOMY Right 12/12/2016   Procedure: RIGHT ENDOSCOPIC SPHENOIDECTOMY;  Surgeon: [name], MD;  Location: [place];  Service: ENT;  Laterality: Right;   TONSILLECTOMY AND ADENOIDECTOMY  child   TOTAL KNEE ARTHROPLASTY Left 10/14/2014   Procedure: LEFT TOTAL KNEE ARTHROPLASTY;  Surgeon: [name], MD;  Location: [facility];  Service: Orthopedics;  Laterality: Left;   TOTAL KNEE ARTHROPLASTY Right 1990s   TRANSTHORACIC ECHOCARDIOGRAM  12-24-2015   dr [name]   ef 55-60%,  grade 1 diastolic dysfunction/  trivial AR and TR  mild MR and PR/  moderate LAE   TRANSURETHRAL RESECTION OF PROSTATE  06-11-2010   dr Gaynelle Arabian  [facility]   w/  GYRUS   TRANSURETHRAL RESECTION OF PROSTATE N/A 12/06/2017   Procedure: TRANSURETHRAL RESECTION OF THE PROSTATE (TURP)/ BIPOLAR;  Surgeon: Ceasar Mons, MD;  Location: [facility];  Service: Urology;  Laterality: N/A;   TRANSURETHRAL RESECTION OF PROSTATE N/A 04/11/2018   Procedure: TRANSURETHRAL RESECTION OF THE PROSTATE (TURP);  Surgeon: Ceasar Mons, MD;  Location: [facility];  Service: Urology;  Laterality: N/A;   UPPER GASTROINTESTINAL ENDOSCOPY         Family History  Problem Relation Age of Onset   Thyroid cancer Brother    Healthy Mother    Healthy Father    Other Sister    Heart disease Brother    Healthy Sister    Healthy Sister    Diabetes type I Son    Colon cancer Neg  Hx    Esophageal cancer Neg Hx    Stomach cancer Neg Hx    Rectal cancer Neg Hx     Social History   Tobacco Use   Smoking status: Former    Packs/day: 2.00    Years: 15.00    Pack years: 30.00    Types: Cigarettes    Quit date: 11/30/1967    Years since quitting: 53.7   Smokeless tobacco: Never  Vaping Use   Vaping Use: Never used  Substance Use Topics   Alcohol use: Yes    Comment: occasional  Drug use: No    Home Medications Prior to Admission medications   Medication Sig Start Date End Date Taking? Authorizing Provider  acetaminophen (TYLENOL) 325 MG tablet Take 2 tablets (650 mg total) by mouth every 6 (six) hours as needed for mild pain. [id number]   Lianne Cure, DO  aspirin EC 81 MG tablet Take 81 mg by mouth daily.    [provider]  AZO-CRANBERRY PO Take 2 tablets by mouth 2 (two) times daily.    [provider]  b complex vitamins capsule Take 1 capsule by mouth daily.    [provider]  carvedilol (COREG) 6.25 MG tablet TAKE 1 TABLET BY MOUTH TWICE A DAY 10/02/20   Fay Records, MD  cephALEXin (KEFLEX) 500 MG capsule Take 1 capsule (500 mg total) by mouth 4 (four) times daily for 7 days. 07/28/21 [id number]  Campbell Stall P, DO  [employer]S B-1 100 MG tablet Take 100 mg by mouth daily. 04/15/20   [provider]  CVS STOOL SOFTENER 100 MG capsule Take 100 mg by mouth as needed for constipation. 04/17/20   [provider]  Cyanocobalamin (B-12) 1000 MCG LOZG Place 1 tablet under the tongue daily. 03/18/20   [provider]  diazepam (VALIUM) 5 MG tablet Take 2.5 mg by mouth 2 (two) times daily.  04/16/14   [provider]  diphenhydramine-acetaminophen (TYLENOL PM) 25-500 MG TABS tablet Take 1 tablet by mouth at bedtime.     [provider]  levothyroxine (LEVOTHROID) 25 MCG tablet Take 25 mcg by mouth every evening.  12/28/15   Larey Dresser, MD  omeprazole (PRILOSEC) 20 MG capsule TAKE 1 CAPSULE BY MOUTH  EVERY DAY 12/22/20   Irene Shipper, MD  polyethylene glycol [facility] Floria Raveling) packet Take 17 g by mouth daily as needed for mild constipation.    [provider]  Probiotic Product (PROBIOTIC PO) Take 1 capsule by mouth daily.    [provider]  rosuvastatin (CRESTOR) 5 MG tablet Take 1 tablet (5 mg total) by mouth daily. 12/09/20   Fay Records, MD  trimethoprim (TRIMPEX) 100 MG tablet Take 100 mg by mouth at bedtime. 09/17/18   [provider]    Allergies    Cyclobenzaprine, Lisinopril, Relafen [nabumetone], and Statins  Review of Systems   Review of Systems  Constitutional:  Negative for appetite change and fever.  HENT:  Negative for congestion.   Respiratory:  Negative for shortness of breath.   Cardiovascular:  Positive for leg swelling.  Gastrointestinal:  Negative for abdominal pain.  Genitourinary:  Negative for flank pain.  Skin:  Positive for color change. Negative for rash and wound.  Neurological:  Negative for weakness.   Physical Exam Updated Vital Signs BP 136/84 (BP Location: Right Arm)   Pulse 69   Temp 98.4 F (36.9 C) (Oral)   Resp 18   Ht '5\' 10"'$  (1.778 m)   Wt 90.3 kg   SpO2 99%   BMI 28.55 kg/m   Physical Exam Vitals and nursing note reviewed.  HENT:     Head: Atraumatic.  Cardiovascular:     Rate and Rhythm: Normal rate.  Pulmonary:     Breath sounds: No wheezing or rhonchi.  Abdominal:     Tenderness: There is no abdominal tenderness.  Musculoskeletal:     Cervical back: Neck supple.     Right lower leg: Edema present.     Comments: Edema of right lower leg and  right foot.  Some mild erythema near toes.  No fluctuance.  Able to move toes and ankle freely.  No pain with movement.  No real tenderness but does have a fair amount of edema.  Leg is not cool.  Skin:    Capillary Refill: Capillary refill takes less than 2 seconds.  Neurological:     Mental Status: He is alert and oriented to person, place, and time.      ED Results / Procedures / Treatments   Labs (all labs ordered are listed, but only abnormal results are displayed) Labs Reviewed - No data to display  EKG None  Radiology No results found.  Procedures Procedures   Medications Ordered in ED Medications - No data to display  ED Course  I have reviewed the triage vital signs and the nursing notes.  Pertinent labs & imaging results that were available during my care of the patient were reviewed by me and considered in my medical decision making (see chart for details).    MDM Rules/Calculators/A&P                           Patient with swelling of right lower leg.  Currently being treated for cellulitis.  Does have some swelling but recently had negative Doppler.  Do not feel the need to recheck at this time.  Patient states he feels that the leg is doing better.  Mildly decreased redness.  Less pain.  There is some swelling but instructed on keeping foot raised and will use Ace wraps as needed.  Has follow-up in 2 days with PCP.  Will discharge.  Do not feel the need to repeat imaging at this time. Final Clinical Impression(s) / ED Diagnoses Final diagnoses:  Cellulitis of right lower extremity    Rx / DC Orders ED Discharge Orders     None        Davonna Belling, MD 07/31/21 1746

## 2021-08-02 ENCOUNTER — Ambulatory Visit: Payer: PPO | Admitting: Internal Medicine

## 2021-08-02 DIAGNOSIS — I872 Venous insufficiency (chronic) (peripheral): Secondary | ICD-10-CM | POA: Diagnosis not present

## 2021-08-02 DIAGNOSIS — L03115 Cellulitis of right lower limb: Secondary | ICD-10-CM | POA: Diagnosis not present

## 2021-08-02 DIAGNOSIS — N319 Neuromuscular dysfunction of bladder, unspecified: Secondary | ICD-10-CM | POA: Diagnosis not present

## 2021-08-02 DIAGNOSIS — I10 Essential (primary) hypertension: Secondary | ICD-10-CM | POA: Diagnosis not present

## 2021-08-05 DIAGNOSIS — R3914 Feeling of incomplete bladder emptying: Secondary | ICD-10-CM | POA: Diagnosis not present

## 2021-08-05 DIAGNOSIS — N312 Flaccid neuropathic bladder, not elsewhere classified: Secondary | ICD-10-CM | POA: Diagnosis not present

## 2021-08-05 DIAGNOSIS — N139 Obstructive and reflux uropathy, unspecified: Secondary | ICD-10-CM | POA: Diagnosis not present

## 2021-08-12 DIAGNOSIS — L03115 Cellulitis of right lower limb: Secondary | ICD-10-CM | POA: Diagnosis not present

## 2021-08-12 DIAGNOSIS — R601 Generalized edema: Secondary | ICD-10-CM | POA: Diagnosis not present

## 2021-08-12 DIAGNOSIS — I872 Venous insufficiency (chronic) (peripheral): Secondary | ICD-10-CM | POA: Diagnosis not present

## 2021-08-27 ENCOUNTER — Encounter: Payer: Self-pay | Admitting: Neurology

## 2021-08-27 ENCOUNTER — Ambulatory Visit: Payer: PPO | Admitting: Neurology

## 2021-08-27 ENCOUNTER — Other Ambulatory Visit: Payer: Self-pay

## 2021-08-27 VITALS — BP 125/74 | HR 77 | Ht 70.0 in | Wt 192.0 lb

## 2021-08-27 DIAGNOSIS — R278 Other lack of coordination: Secondary | ICD-10-CM | POA: Diagnosis not present

## 2021-08-27 DIAGNOSIS — G621 Alcoholic polyneuropathy: Secondary | ICD-10-CM | POA: Diagnosis not present

## 2021-08-27 MED ORDER — GABAPENTIN 300 MG PO CAPS: 300.0000 mg | ORAL_CAPSULE | Freq: Three times a day (TID) | ORAL | 5 refills | Status: DC

## 2021-08-27 NOTE — Patient Instructions (Addendum)
Start gabapentin 300mg  at bedtime  Return to clinic in 1 year

## 2021-08-27 NOTE — Progress Notes (Signed)
Follow-up Visit   Date: 08/27/21    David Gutierrez MRN: [id number] DOB: 07/30/1933   Interim History: David Gutierrez is a 87 y.o. right-handed Caucasian male with hypertension, CAD, GERD, hyperlipidemia, interstitial cystitis, s/p lumbar surgery (1952) and hypothyroidism returning to the clinic for follow-up of neuropathy, alcohol-induced.  The patient was accompanied to the clinic by self.  History of present illness: A few years ago, he began noticing imbalance when climbing hills while playing golf.  He gave up playing golf late 2016 because of difficulty with balance.  He walk independently, but when walking his dog he takes a hiking stick which helps. He stays very active and had a trainer as well as classes that he attends for balance.   Several years ago, he also noticed that he has numbness involving the feet. He drinks about 4-5oz vodka and occasionally wine for the past 20 years.  No history of diabetes. He was diagnosed with alcohol-induced neuropathy.  He quit drinking alcohol for 1 year and noticed mild improvement in his feet paresthesias, but not enough for him to want to completely abstain from alcohol.  He has been using a foot massager with electrical stimulator, which provides some relief.  He was found to be vitamin B12 and B1 deficient and since taking supplements, his energy level has improved.   UPDATE 08/24/2020:  He is here for 1 year follow-up visit.  Over the past year, he has noticed mild progression of numbness in the legs and imbalance. He is unable to walk as far and tires easily. He uses a walker/cane as needed.  He suffered one fall while picking flowers outside and bruised his right foot.  He continues to drink (2) 2oz alcoholic beverages nightly.  No new complaints.   UPDATE 08/27/2021:  He is here for 1 year visit.  His neuropathy has been stable.  He does have some tingling in the feet which can be bothersome at night.  Numbness remains in the lower legs and  feet. He uses a cane and a walker for long distances. He was diagnosed with cellulitis of the right foot last month and has been having ongoing issues with this.  No falls.  He continues to drive and is very cautious.  He tells me that he can control the pedals.  He is requesting renewal for handicap placard.   Medications:  Current Outpatient Medications on File Prior to Visit  Medication Sig Dispense Refill   acetaminophen (TYLENOL) 325 MG tablet Take 2 tablets (650 mg total) by mouth every 6 (six) hours as needed for mild pain. 30 tablet 0   aspirin EC 81 MG tablet Take 81 mg by mouth daily.     AZO-CRANBERRY PO Take 2 tablets by mouth 2 (two) times daily.     b complex vitamins capsule Take 1 capsule by mouth daily.     carvedilol (COREG) 6.25 MG tablet TAKE 1 TABLET BY MOUTH TWICE A DAY 180 tablet 3   CVS B-1 100 MG tablet Take 100 mg by mouth daily.     CVS STOOL SOFTENER 100 MG capsule Take 100 mg by mouth as needed for constipation.     Cyanocobalamin (B-12) 1000 MCG LOZG Place 1 tablet under the tongue daily.     diazepam (VALIUM) 5 MG tablet Take 2.5 mg by mouth 2 (two) times daily.      diphenhydramine-acetaminophen (TYLENOL PM) 25-500 MG TABS tablet Take 1 tablet by mouth at bedtime.  doxycycline (VIBRA-TABS) 100 MG tablet Take 100 mg by mouth 2 (two) times daily.     levothyroxine (SYNTHROID) 25 MCG tablet Take 25 mcg by mouth every evening.      omeprazole (PRILOSEC) 20 MG capsule TAKE 1 CAPSULE BY MOUTH EVERY DAY 90 capsule 3   polyethylene glycol (MIRALAX / GLYCOLAX) packet Take 17 g by mouth daily as needed for mild constipation.     Probiotic Product (PROBIOTIC PO) Take 1 capsule by mouth daily.     rosuvastatin (CRESTOR) 5 MG tablet Take 1 tablet (5 mg total) by mouth daily. 90 tablet 1   trimethoprim (TRIMPEX) 100 MG tablet Take 100 mg by mouth at bedtime.     Current Facility-Administered Medications on File Prior to Visit  Medication Dose Route Frequency Provider  Last Rate Last Admin   0.9 %  sodium chloride infusion  500 mL Intravenous Once Irene Shipper, MD        Allergies:  Allergies  Allergen Reactions   Cyclobenzaprine Other (See Comments)    Lost balance   Lisinopril Other (See Comments)    Affected ability to urinate   Relafen [Nabumetone] Other (See Comments)    Problems urinating afterwards    Statins Other (See Comments)    "my peeing"    Vital Signs:  BP 125/74   Pulse 77   Ht 5\' 10"  (1.778 m)   Wt 192 lb (87.1 kg)   SpO2 96%   BMI 27.55 kg/m   Neurological Exam: MENTAL STATUS including orientation to time, place, person, recent and remote memory, attention span and concentration, language, and fund of knowledge is normal.  Speech is not dysarthric.  CRANIAL NERVES:  Pupils round and reactive to light.  Extraocular muscles intact.  Face is symmetric.  MOTOR:  Motor strength is 5/5 throughout including distally in the feet.  No pronator drift.  Tone is normal.    MSRs:  Right                                                                 Left brachioradialis 2+  brachioradialis 2+  biceps 2+  biceps 2+  triceps 2+  triceps 2+  patellar 1+  patellar 1+  ankle jerk 0  ankle jerk 0   SENSORY:  Vibration and light touch reduced distal to ankles bilaterally.  There is mild sway with Rhomberg testing.  COORDINATION/GAIT:   Gait wide-based, assisted with cane,  and somewhat unsteady.  LABS: Labs 03/02/2016:  TSH 3.02, Na 142, K 4.7, Glucose 94, BUN 14, Cr 0.7, LFTs normal Labs 05/06/2016:  Vitamin B12 242, folate 12.2, copper 97, SPEP with IFE no M protein  Lab Results  Component Value Date   VITAMINB12 421 05/08/2017    IMPRESSION: Peripheral neuropathy, alcohol-induced, worsening.  He has numbness, tingling, and ataxia.  - Start gabapentin 300mg  at bedtime   - Continue home balance exercises  - Patient educated on daily foot inspection, fall prevention, and safety precautions around the home.  - Advised patient  to stop driving, if he is unable to manipulate the pedals safely  - Handicap placard given  Right leg cellulitis  - Follow-up with PCP  Return to clinic in 1 year  Thank you for allowing me to participate  in patient's care.  If I can answer any additional questions, I would be pleased to do so.    Sincerely,    Dioselina Brumbaugh K. Posey Pronto, DO

## 2021-09-03 ENCOUNTER — Other Ambulatory Visit: Payer: Self-pay | Admitting: Internal Medicine

## 2021-09-04 DIAGNOSIS — R3914 Feeling of incomplete bladder emptying: Secondary | ICD-10-CM | POA: Diagnosis not present

## 2021-09-04 DIAGNOSIS — N312 Flaccid neuropathic bladder, not elsewhere classified: Secondary | ICD-10-CM | POA: Diagnosis not present

## 2021-09-04 DIAGNOSIS — N139 Obstructive and reflux uropathy, unspecified: Secondary | ICD-10-CM | POA: Diagnosis not present

## 2021-09-20 ENCOUNTER — Ambulatory Visit: Payer: PPO | Admitting: Internal Medicine

## 2021-09-21 DIAGNOSIS — Z85828 Personal history of other malignant neoplasm of skin: Secondary | ICD-10-CM | POA: Diagnosis not present

## 2021-09-21 DIAGNOSIS — L814 Other melanin hyperpigmentation: Secondary | ICD-10-CM | POA: Diagnosis not present

## 2021-09-21 DIAGNOSIS — D485 Neoplasm of uncertain behavior of skin: Secondary | ICD-10-CM | POA: Diagnosis not present

## 2021-09-21 DIAGNOSIS — L570 Actinic keratosis: Secondary | ICD-10-CM | POA: Diagnosis not present

## 2021-09-21 DIAGNOSIS — L812 Freckles: Secondary | ICD-10-CM | POA: Diagnosis not present

## 2021-09-21 DIAGNOSIS — L821 Other seborrheic keratosis: Secondary | ICD-10-CM | POA: Diagnosis not present

## 2021-10-03 ENCOUNTER — Other Ambulatory Visit: Payer: Self-pay | Admitting: Internal Medicine

## 2021-10-03 DIAGNOSIS — I428 Other cardiomyopathies: Secondary | ICD-10-CM

## 2021-10-03 DIAGNOSIS — I2510 Atherosclerotic heart disease of native coronary artery without angina pectoris: Secondary | ICD-10-CM

## 2021-10-04 NOTE — Telephone Encounter (Signed)
Rx(s) sent to pharmacy electronically.  

## 2021-10-05 DIAGNOSIS — R3914 Feeling of incomplete bladder emptying: Secondary | ICD-10-CM | POA: Diagnosis not present

## 2021-10-05 DIAGNOSIS — N139 Obstructive and reflux uropathy, unspecified: Secondary | ICD-10-CM | POA: Diagnosis not present

## 2021-10-05 DIAGNOSIS — N312 Flaccid neuropathic bladder, not elsewhere classified: Secondary | ICD-10-CM | POA: Diagnosis not present

## 2021-10-18 DIAGNOSIS — N3020 Other chronic cystitis without hematuria: Secondary | ICD-10-CM | POA: Diagnosis not present

## 2021-10-18 DIAGNOSIS — N312 Flaccid neuropathic bladder, not elsewhere classified: Secondary | ICD-10-CM | POA: Diagnosis not present

## 2021-11-05 DIAGNOSIS — N139 Obstructive and reflux uropathy, unspecified: Secondary | ICD-10-CM | POA: Diagnosis not present

## 2021-11-05 DIAGNOSIS — N312 Flaccid neuropathic bladder, not elsewhere classified: Secondary | ICD-10-CM | POA: Diagnosis not present

## 2021-11-05 DIAGNOSIS — R3914 Feeling of incomplete bladder emptying: Secondary | ICD-10-CM | POA: Diagnosis not present

## 2021-12-02 ENCOUNTER — Other Ambulatory Visit: Payer: Self-pay | Admitting: Internal Medicine

## 2021-12-13 DIAGNOSIS — N139 Obstructive and reflux uropathy, unspecified: Secondary | ICD-10-CM | POA: Diagnosis not present

## 2021-12-13 DIAGNOSIS — N312 Flaccid neuropathic bladder, not elsewhere classified: Secondary | ICD-10-CM | POA: Diagnosis not present

## 2021-12-13 DIAGNOSIS — R3914 Feeling of incomplete bladder emptying: Secondary | ICD-10-CM | POA: Diagnosis not present

## 2022-01-06 DIAGNOSIS — N312 Flaccid neuropathic bladder, not elsewhere classified: Secondary | ICD-10-CM | POA: Diagnosis not present

## 2022-01-06 DIAGNOSIS — R3914 Feeling of incomplete bladder emptying: Secondary | ICD-10-CM | POA: Diagnosis not present

## 2022-01-06 DIAGNOSIS — N139 Obstructive and reflux uropathy, unspecified: Secondary | ICD-10-CM | POA: Diagnosis not present

## 2022-01-14 ENCOUNTER — Other Ambulatory Visit: Payer: Self-pay | Admitting: Internal Medicine

## 2022-01-14 DIAGNOSIS — I428 Other cardiomyopathies: Secondary | ICD-10-CM

## 2022-01-14 DIAGNOSIS — I2510 Atherosclerotic heart disease of native coronary artery without angina pectoris: Secondary | ICD-10-CM

## 2022-01-20 DIAGNOSIS — N312 Flaccid neuropathic bladder, not elsewhere classified: Secondary | ICD-10-CM | POA: Diagnosis not present

## 2022-01-20 DIAGNOSIS — N3020 Other chronic cystitis without hematuria: Secondary | ICD-10-CM | POA: Diagnosis not present

## 2022-01-25 DIAGNOSIS — L609 Nail disorder, unspecified: Secondary | ICD-10-CM | POA: Diagnosis not present

## 2022-01-25 DIAGNOSIS — L03115 Cellulitis of right lower limb: Secondary | ICD-10-CM | POA: Diagnosis not present

## 2022-01-31 NOTE — Progress Notes (Signed)
Cardiology Office Note    Date:  02/04/22  ID:  [name], DOB 04/01/1933, MRN [id number]  PCP:  [name], MD  Cardiologist:  Dr. Harrington Challenger   History of Present Illness:   DONNA SILVERMAN is a 88 y.o. male with history of nonobstructive coronary artery disease, nonischemic cardiomyopathy with improved LV function, chronic left bundle branch block, GERD with esophageal stricture/dysphagia (followed by Dr. Henrene Pastor) and hyperlipidemia presents for follow-up.  Echocardiogram 10/24/2012 with LV function of 40%, mild mitral regurgitation. Cardiac catheterization in 2013 showed minimal noncompressive CAD. Last echocardiogram January 2017 showed LV function of 55 to 13%, grade 1 diastolic dysfunction and mild mitral regurgitation.  Sicne seen the pt denies CP  Breathing is OK  Denies dizziness  No syncope  No palpitations    Notes occsaional difficulty swallowing   Past Medical History:  Diagnosis Date   Alcoholic peripheral neuropathy [facility])    neurologist-  dr patel--- feet numbness and sensory ataxia   Arthritis    B12 deficiency    BPH (benign prostatic hyperplasia)    CAD (coronary artery disease) cardiolgoist-  dr Harrell Gave end (previouly dr dalton [name])   a. Myoview 11/13:  EF 38%, inf and IS defect c/w scar but no ischemia:  b. Cardiac CT 11/13:  Ca score 318 Agatson units, pLAD and dCFX plaque;   c. LHC 11/07/12:  pLAD 30%, oD1 40%, oCFX 30%, dCFX 40-50%, mRCA 40%, EF 50% (frequent PVCs and short run of NSVT with injection/    per last echo 01/ 2017  ef 55-60%   Chronic constipation    Diverticulosis of colon    Esophageal reflux    External hemorrhoids    First degree heart block    Hiatal hernia    History of adenomatous polyp of colon    History of esophageal stricture 08/11/2015   s/p  dilatation   History of lower GI bleeding 03/01/2017   s/p  flexiable simoidscopy w/ clipping rectum ulcer   Hypogonadism male    Hypothyroidism    Interstitial cystitis     Irritable bowel syndrome    LBBB (left bundle branch block)    NICM (nonischemic cardiomyopathy) [facility]) cardiologist-  dr Harrell Gave end (previously dr dalton [name])--  per last echo 01/ 2017  ef 55-60%   ? 2/2 LBBB => a. echo 11/13: diff HK, worse in septum and apex, mod LVE, mild LVH, EF 40%, mild AI, mild MR, mod LAE   Pre-diabetes    Self-catheterizes urinary bladder    bid to tid    Wears glasses    Wears hearing aid in both ears     Past Surgical History:  Procedure Laterality Date   CARDIAC CATHETERIZATION  11-07-2012    dr [name]   nonobstructive CAD (pLAD 30%, ostial D1 40%, ostial LCx 30%, dLCx diffuse 40-50%, mRCA 40%) ;  LVSF 50% but diffiult due to PVCs and short run VT with injection;  cardiomyopathy mostly likely a LBBB cardiomyopathy   CARDIOVASCULAR STRESS TEST  10-16-2012   dr [name]   Low risk adenosine nuclear study (no exercise) w/ a fixed inferior and inferoseptal defect without ischemia (question as whether this abnormality due to LBBB cardiomyopathy or due to scar)/  LV ef 38%,  LV wall motion decreased of the septum and entire apex   CATARACT EXTRACTION W/ INTRAOCULAR LENS  IMPLANT, BILATERAL  2012  approx.   COLONOSCOPY  2016   Dr. Henrene Pastor, Per new patient form  CYSTO/  HYDRODISTENTION/  BLADDER BIOPSY  04-20-2005    dr Lawerance Bach  [facility]   ETHMOIDECTOMY Right 12/12/2016   Procedure: RIGHT ENDOSCOPIC ETHMOIDECTOMY;  Surgeon: [name], MD;  Location: [place];  Service: ENT;  Laterality: Right;   FLEXIBLE SIGMOIDOSCOPY N/A 03/02/2017   Procedure: FLEXIBLE SIGMOIDOSCOPY;  Surgeon: Ladene Artist, MD;  Location: [facility];  Service: Endoscopy;  Laterality: N/A;   LUMBAR LAMINECTOMY  1952   MAXILLARY ANTROSTOMY Right 12/12/2016   Procedure: RIGHT ENDOSCOPIC MAXILLARY ANTROSTOMY;  Surgeon: [name], MD;  Location: [place];  Service: ENT;  Laterality: Right;   SINUS ENDO W/FUSION Right 12/12/2016   Procedure: ENDOSCOPIC SINUS SURGERY  WITH NAVIGATION;  Surgeon: [name], MD;  Location: [place];  Service: ENT;  Laterality: Right;   SPHENOIDECTOMY Right 12/12/2016   Procedure: RIGHT ENDOSCOPIC SPHENOIDECTOMY;  Surgeon: [name], MD;  Location: [place];  Service: ENT;  Laterality: Right;   TONSILLECTOMY AND ADENOIDECTOMY  child   TOTAL KNEE ARTHROPLASTY Left 10/14/2014   Procedure: LEFT TOTAL KNEE ARTHROPLASTY;  Surgeon: [name], MD;  Location: [facility];  Service: Orthopedics;  Laterality: Left;   TOTAL KNEE ARTHROPLASTY Right 1990s   TRANSTHORACIC ECHOCARDIOGRAM  12-24-2015   dr [name]   ef 55-60%,  grade 1 diastolic dysfunction/  trivial AR and TR  mild MR and PR/  moderate LAE   TRANSURETHRAL RESECTION OF PROSTATE  06-11-2010   dr Gaynelle Arabian  [facility]   w/  GYRUS   TRANSURETHRAL RESECTION OF PROSTATE N/A 12/06/2017   Procedure: TRANSURETHRAL RESECTION OF THE PROSTATE (TURP)/ BIPOLAR;  Surgeon: Ceasar Mons, MD;  Location: [facility];  Service: Urology;  Laterality: N/A;   TRANSURETHRAL RESECTION OF PROSTATE N/A 04/11/2018   Procedure: TRANSURETHRAL RESECTION OF THE PROSTATE (TURP);  Surgeon: Ceasar Mons, MD;  Location: [facility];  Service: Urology;  Laterality: N/A;   UPPER GASTROINTESTINAL ENDOSCOPY      Current Medications:  Prior to Admission medications   Medication Sig Start Date End Date Taking? Authorizing Provider  aspirin EC 81 MG tablet Take 81 mg by mouth daily.   Yes [provider]  AZO-CRANBERRY PO Take 2 tablets by mouth 2 (two) times daily.   Yes [provider]  b complex vitamins capsule Take 1 capsule by mouth daily.   Yes [provider]  carvedilol (COREG) 6.25 MG tablet TAKE 1 TABLET BY MOUTH TWICE A DAY 10/10/19  Yes Fay Records, MD  [employer] B-1 100 MG tablet Take 100 mg by mouth daily. 04/15/20  Yes [provider]  [employer] STOOL SOFTENER 100 MG capsule Take 100 mg by mouth as needed for  constipation. 04/17/20  Yes [provider]  Cyanocobalamin (B-12) 1000 MCG LOZG Place 1 tablet under the tongue daily. 03/18/20  Yes [provider]  diazepam (VALIUM) 5 MG tablet Take 2.5 mg by mouth 2 (two) times daily.  04/16/14  Yes [provider]  diphenhydramine-acetaminophen (TYLENOL PM) 25-500 MG TABS tablet Take 1 tablet by mouth at bedtime.    Yes [provider]  levothyroxine (LEVOTHROID) 25 MCG tablet Take 25 mcg by mouth every evening.  12/28/15  Yes Larey Dresser, MD  omeprazole (PRILOSEC) 20 MG capsule Take 1 capsule (20 mg total) by mouth daily. 01/15/20  Yes Irene Shipper, MD  polyethylene glycol [facility] Floria Raveling) packet Take 17 g by mouth daily as needed for mild constipation.  Yes [provider]  Probiotic Product (PROBIOTIC PO) Take 1 capsule by mouth daily.   Yes [provider]  rosuvastatin (CRESTOR) 5 MG tablet TAKE ONE TABLET DAILY 12/13/19  Yes End, Harrell Gave, MD  trimethoprim (TRIMPEX) 100 MG tablet Take 100 mg by mouth at bedtime. 09/17/18  Yes [provider]     Allergies:   Cyclobenzaprine, Lisinopril, Relafen [nabumetone], and Statins   Social History   Socioeconomic History   Marital status: Married    Spouse name: Not on file   Number of children: 3   Years of education: Not on file   Highest education level: Not on file  Occupational History   Occupation: retired  Tobacco Use   Smoking status: Former    Packs/day: 2.00    Years: 15.00    Pack years: 30.00    Types: Cigarettes    Quit date: 11/30/1967    Years since quitting: 54.2   Smokeless tobacco: Never  Vaping Use   Vaping Use: Never used  Substance and Sexual Activity   Alcohol use: Yes    Alcohol/week: 2.0 standard drinks    Types: 2 Standard drinks or equivalent per week    Comment: occasional   Drug use: No   Sexual activity: Yes  Other Topics Concern   Not on file  Social History Narrative   Do you drink/eat  things with caffeine? Very Little    Marital status/What year were you married? Married since [place] you live in a house, apartment, assisted living, condo, trailer, etc.? Did not answer   Is it one or more stories? One   How many persons live in your home? 2    Do you have any pets in your home? No   Current or past profession: Academic librarian.    Do you exercise? Very little    Type and how often? Did not answer    Do you have a living will? Yes   Do you have a DNR form? Did not answer   Do you have signed POA/HPOA forms? Did not answer   Social Determinants of Health   Financial Resource Strain: Not on file  Food Insecurity: Not on file  Transportation Needs: Not on file  Physical Activity: Not on file  Stress: Not on file  Social Connections: Not on file     Family History:  The patient's family history includes Diabetes type I in his son; Healthy in his father, mother, and sister; Heart disease in his brother; Other in his sister; Thyroid cancer in his brother.   ROS:   Please see the history of present illness.    ROS All other systems reviewed and are negative.   PHYSICAL EXAM:   VS:  BP 102/60    Pulse 70    Ht 5' 10.5" (1.791 m)    Wt 193 lb 4.8 oz (87.7 kg)    SpO2 98%    BMI 27.34 kg/m    GEN: Well nourished, well developed, in no acute distress  HEENT: normal  Neck: no JVD, carotid bruits, or masses Cardiac: RRR; no murmurs, rubs, or gallops,no edema  Respiratory:  clear to auscultation bilaterally, normal work of breathing GI: soft, nontender, nondistended, + BS MS: no deformity or atrophy  Skin: warm and dry, no rash Neuro:  Alert and Oriented x 3, Strength and sensation are intact Psych: euthymic mood, full affect  Wt Readings from Last 3 Encounters:  02/04/22 193 lb 4.8 oz (87.7  kg)  02/02/22 193 lb 9.6 oz (87.8 kg)  08/27/21 192 lb (87.1 kg)      Studies/Labs Reviewed:   EKG:  EKG is ordered today.    NSR yo   LBBB   First degree AV block   PR 314  msec  Recent Labs: 07/28/2021: Hemoglobin 13.2; Platelets 124   Lipid Panel    Component Value Date/Time   CHOL 145 03/24/2016 0735   TRIG 92 03/24/2016 0735   HDL 46 03/24/2016 0735   CHOLHDL 3.2 03/24/2016 0735   VLDL 18 03/24/2016 0735   LDLCALC 81 03/24/2016 0735    Additional studies/ records that were reviewed today include:   Echocardiogram: 12/2015 Study Conclusions   - Left ventricle: The cavity size was normal. There was mild focal    basal hypertrophy of the septum. Systolic function was normal.    The estimated ejection fraction was in the range of 55% to 60%.    Wall motion was normal; there were no regional wall motion    abnormalities. Doppler parameters are consistent with abnormal    left ventricular relaxation (grade 1 diastolic dysfunction).  - Aortic valve: There was trivial regurgitation.  - Mitral valve: There was mild regurgitation.  - Left atrium: The atrium was moderately dilated.  - Right ventricle: The cavity size was normal. Wall thickness was    normal. Systolic function was normal.   Coronary angiography:11/2012 Coronary dominance: right   Left mainstem: No significant disease.    Left anterior descending (LAD): 30% proximal LAD stenosis.  40% ostial D1 stenosis.    Left circumflex (LCx): Moderate ramus with luminal irregularities.  30% ostial LCx.  Diffuse disease up to 40-50% in the distal LCx.    Right coronary artery (RCA): 40% mid RCA stenosis.     Left ventriculography: Left ventricular systolic function is probably mildly decreased, LVEF is estimated at 50% but difficult due to PVCs and short run VT with injection.    Final Conclusions:  Nonobstructive CAD.  I suspect that Mr Tegtmeyer cardiomyopathy is most likely a LBBB cardiomyopathy.  I have started him on Coreg and lisinopril.  He should continue ASA and atorvastatin due to significant, though nonobstructive, CAD.      ASSESSMENT & PLAN:    NICM - Last echo in 12/2015 showed  normal LVEF.  No CHF symptoms.  Volume is OK on exa   Follow    2. Non obstructive CAD by cath in 2013 Remains asympomtatic     Continue ASA  b blocker and statin  Continue aspirin  3  HL    Continue Crestor   -Continue statin -Followed by PCP  4.  Alcohol induced peripheral neuropathy with numbness and gait imbalance -Followed by neurologist -He uses cane for ambulation now prescribed walker -   Medication Adjustments/Labs and Tests Ordered: Current medicines are reviewed at length with the patient today.  Concerns regarding medicines are outlined above.  Medication changes, Labs and Tests ordered today are listed in the Patient Instructions below. Patient Instructions  Medication Instructions:  Your physician recommends that you continue on your current medications as directed. Please refer to the Current Medication list given to you today.  *If you need a refill on your cardiac medications before your next appointment, please call your pharmacy*   Lab Work: none If you have labs (blood work) drawn today and your tests are completely normal, you will receive your results only by: [place] (if you have MyChart) OR A  paper copy in the mail If you have any lab test that is abnormal or we need to change your treatment, we will call you to review the results.   Testing/Procedures: none   Follow-Up: At [facility], you and your health needs are our priority.  As part of our continuing mission to provide you with exceptional heart care, we have created designated Provider Care Teams.  These Care Teams include your primary Cardiologist (physician) and Advanced Practice Providers (APPs -  Physician Assistants and Nurse Practitioners) who all work together to provide you with the care you need, when you need it.  We recommend signing up for the patient portal called "MyChart".  Sign up information is provided on this After Visit Summary.  MyChart is used to connect with  patients for Virtual Visits (Telemedicine).  Patients are able to view lab/test results, encounter notes, upcoming appointments, etc.  Non-urgent messages can be sent to your provider as well.   To learn more about what you can do with MyChart, go to [email or web address].    Your next appointment:   10 month(s)  The format for your next appointment:   In Person  Provider:  DR. Dorris Carnes  {    Signed, Dorris Carnes, MD  02/06/2022 11:42 PM    [address], [place], [place]  [address] Phone: [phone]; Fax: [phone]

## 2022-02-02 ENCOUNTER — Other Ambulatory Visit: Payer: Self-pay

## 2022-02-02 ENCOUNTER — Non-Acute Institutional Stay: Payer: PPO | Admitting: Internal Medicine

## 2022-02-02 ENCOUNTER — Encounter: Payer: Self-pay | Admitting: Internal Medicine

## 2022-02-02 VITALS — BP 116/78 | HR 67 | Temp 97.5°F | Ht 70.0 in | Wt 193.6 lb

## 2022-02-02 DIAGNOSIS — E538 Deficiency of other specified B group vitamins: Secondary | ICD-10-CM | POA: Diagnosis not present

## 2022-02-02 DIAGNOSIS — N312 Flaccid neuropathic bladder, not elsewhere classified: Secondary | ICD-10-CM

## 2022-02-02 DIAGNOSIS — E782 Mixed hyperlipidemia: Secondary | ICD-10-CM | POA: Diagnosis not present

## 2022-02-02 DIAGNOSIS — N139 Obstructive and reflux uropathy, unspecified: Secondary | ICD-10-CM | POA: Diagnosis not present

## 2022-02-02 DIAGNOSIS — F5101 Primary insomnia: Secondary | ICD-10-CM

## 2022-02-02 DIAGNOSIS — G609 Hereditary and idiopathic neuropathy, unspecified: Secondary | ICD-10-CM | POA: Diagnosis not present

## 2022-02-02 DIAGNOSIS — I428 Other cardiomyopathies: Secondary | ICD-10-CM | POA: Diagnosis not present

## 2022-02-02 DIAGNOSIS — R3914 Feeling of incomplete bladder emptying: Secondary | ICD-10-CM | POA: Diagnosis not present

## 2022-02-02 NOTE — Patient Instructions (Signed)
Call us if you see any signs of Infection/ Cellulitis ?Try to take Tylenol PM one tab a night ?Can see podiatry here for Nails trim ? ?

## 2022-02-02 NOTE — Progress Notes (Signed)
Location: [place] of Service:  Clinic (12)  Provider:   Code Status:  Goals of Care:  Advanced Directives 02/02/2022  Does Patient Have a Medical Advance Directive? Yes  Type of Advance Directive Living will;Healthcare Power of Attorney  Does patient want to make changes to medical advance directive? No - Patient declined  Copy of [place] in Chart? No - copy requested  Would patient like information on creating a medical advance directive? -  Pre-existing out of facility DNR order (yellow form or pink MOST form) -     Chief Complaint  Patient presents with   Medical Management of Chronic Issues    Patient here today to establish care. He would like to discuss his cellulitis.    Quality Metric Gaps    TDAP and #4 covid-19 vaccine due.  Patient states he was released from needing colonoscopy.  NCIR/Matrix verified    HPI: Patient is a 88 y.o. male seen today for an Routine Visit  Has h/o Cellulitis in his Right foot His leg continues to be little red and he wants to make sure that it is not recurrence of his cellulitis  History of peripheral neuropathy sees Dr. Posey Pronto.  On gabapentin.  Just takes it at night Has a history of nonobstructive CAD, nonischemic cardiomyopathy, left bundle branch block and HLD Last echo showed EF of 55 to 60% with mild MR History of flaccid neurogenic bladder failed catheterize at least once or twice in a day Recurrent UTIs follows with urology History of insomnia with restless leg.  Is on Valium and Tylenol PM  Lives with his [place] in [place] [facility].  Uses cane to help him walk due to his neuropathy.  Is able to still drive.  No recent falls stays independent in his ADLs and IADLs  Past Medical History:  Diagnosis Date   Alcoholic peripheral neuropathy [facility])    neurologist-  dr patel--- feet numbness and sensory ataxia   Arthritis    B12 deficiency    BPH (benign prostatic hyperplasia)     CAD (coronary artery disease) cardiolgoist-  dr Harrell Gave end (previouly dr dalton [name])   a. Myoview 11/13:  EF 38%, inf and IS defect c/w scar but no ischemia:  b. Cardiac CT 11/13:  Ca score 318 Agatson units, pLAD and dCFX plaque;   c. LHC 11/07/12:  pLAD 30%, oD1 40%, oCFX 30%, dCFX 40-50%, mRCA 40%, EF 50% (frequent PVCs and short run of NSVT with injection/    per last echo 01/ 2017  ef 55-60%   Chronic constipation    Diverticulosis of colon    Esophageal reflux    External hemorrhoids    First degree heart block    Hiatal hernia    History of adenomatous polyp of colon    History of esophageal stricture 08/11/2015   s/p  dilatation   History of lower GI bleeding 03/01/2017   s/p  flexiable simoidscopy w/ clipping rectum ulcer   Hypogonadism male    Hypothyroidism    Interstitial cystitis    Irritable bowel syndrome    LBBB (left bundle branch block)    NICM (nonischemic cardiomyopathy) [facility]) cardiologist-  dr Harrell Gave end (previously dr dalton [name])--  per last echo 01/ 2017  ef 55-60%   ? 2/2 LBBB => a. echo 11/13: diff HK, worse in septum and apex, mod LVE, mild LVH, EF 40%, mild AI, mild MR, mod LAE   Pre-diabetes  Self-catheterizes urinary bladder    bid to tid    Wears glasses    Wears hearing aid in both ears     Past Surgical History:  Procedure Laterality Date   CARDIAC CATHETERIZATION  11-07-2012    dr [name]   nonobstructive CAD (pLAD 30%, ostial D1 40%, ostial LCx 30%, dLCx diffuse 40-50%, mRCA 40%) ;  LVSF 50% but diffiult due to PVCs and short run VT with injection;  cardiomyopathy mostly likely a LBBB cardiomyopathy   CARDIOVASCULAR STRESS TEST  10-16-2012   dr [name]   Low risk adenosine nuclear study (no exercise) w/ a fixed inferior and inferoseptal defect without ischemia (question as whether this abnormality due to LBBB cardiomyopathy or due to scar)/  LV ef 38%,  LV wall motion decreased of the septum and entire apex   CATARACT EXTRACTION W/  INTRAOCULAR LENS  IMPLANT, BILATERAL  2012  approx.   COLONOSCOPY  2016   Dr. Henrene Pastor, Per new patient form   CYSTO/  HYDRODISTENTION/  BLADDER BIOPSY  04-20-2005    dr Lawerance Bach  [facility]   ETHMOIDECTOMY Right 12/12/2016   Procedure: RIGHT ENDOSCOPIC ETHMOIDECTOMY;  Surgeon: [name], MD;  Location: [place];  Service: ENT;  Laterality: Right;   FLEXIBLE SIGMOIDOSCOPY N/A 03/02/2017   Procedure: FLEXIBLE SIGMOIDOSCOPY;  Surgeon: Ladene Artist, MD;  [facility];  Service: Endoscopy;  Laterality: N/A;   LUMBAR LAMINECTOMY  1952   MAXILLARY ANTROSTOMY Right 12/12/2016   Procedure: RIGHT ENDOSCOPIC MAXILLARY ANTROSTOMY;  Surgeon: [name], MD;  Location: [place];  Service: ENT;  Laterality: Right;   SINUS ENDO W/FUSION Right 12/12/2016   Procedure: ENDOSCOPIC SINUS SURGERY WITH NAVIGATION;  Surgeon: [name], MD;  Location: [place];  Service: ENT;  Laterality: Right;   SPHENOIDECTOMY Right 12/12/2016   Procedure: RIGHT ENDOSCOPIC SPHENOIDECTOMY;  Surgeon: [name], MD;  Location: [place];  Service: ENT;  Laterality: Right;   TONSILLECTOMY AND ADENOIDECTOMY  child   TOTAL KNEE ARTHROPLASTY Left 10/14/2014   Procedure: LEFT TOTAL KNEE ARTHROPLASTY;  Surgeon: [name], MD;  [facility];  Service: Orthopedics;  Laterality: Left;   TOTAL KNEE ARTHROPLASTY Right 1990s   TRANSTHORACIC ECHOCARDIOGRAM  12-24-2015   dr [name]   ef 55-60%,  grade 1 diastolic dysfunction/  trivial AR and TR  mild MR and PR/  moderate LAE   TRANSURETHRAL RESECTION OF PROSTATE  06-11-2010   dr Gaynelle Arabian  [facility]   w/  GYRUS   TRANSURETHRAL RESECTION OF PROSTATE N/A 12/06/2017   Procedure: TRANSURETHRAL RESECTION OF THE PROSTATE (TURP)/ BIPOLAR;  Surgeon: Ceasar Mons, MD;  Location: [facility];  Service: Urology;  Laterality: N/A;   TRANSURETHRAL RESECTION OF PROSTATE N/A 04/11/2018   Procedure: TRANSURETHRAL  RESECTION OF THE PROSTATE (TURP);  Surgeon: Ceasar Mons, MD;  [facility];  Service: Urology;  Laterality: N/A;   UPPER GASTROINTESTINAL ENDOSCOPY      Allergies  Allergen Reactions   Cyclobenzaprine Other (See Comments)    Lost balance   Lisinopril Other (See Comments)    Affected ability to urinate   Relafen [Nabumetone] Other (See Comments)    Problems urinating afterwards    Statins Other (See Comments)    "my peeing"    Outpatient Encounter Medications as of 02/02/2022  Medication Sig   acetaminophen (TYLENOL) 325 MG tablet Take 2 tablets (650 mg total) by mouth every 6 (six) hours as needed for  mild pain.   aspirin EC 81 MG tablet Take 81 mg by mouth daily.   AZO-CRANBERRY PO Take 1 tablet by mouth daily.   carvedilol (COREG) 6.25 MG tablet TAKE 1 TABLET BY MOUTH TWICE A DAY   diazepam (VALIUM) 5 MG tablet Take 2.5 mg by mouth 2 (two) times daily.    diphenhydramine-acetaminophen (TYLENOL PM) 25-500 MG TABS tablet Take 1 tablet by mouth at bedtime.    gabapentin (NEURONTIN) 300 MG capsule Take 1 capsule (300 mg total) by mouth 3 (three) times daily. (Patient taking differently: Take 300 mg by mouth daily.)   levothyroxine (SYNTHROID) 25 MCG tablet Take 25 mcg by mouth every evening.    omeprazole (PRILOSEC) 20 MG capsule Take 20 mg by mouth daily.   polyethylene glycol (MIRALAX / GLYCOLAX) packet Take 17 g by mouth daily as needed for mild constipation.   Probiotic Product (PROBIOTIC PO) Take 1 capsule by mouth daily.   rosuvastatin (CRESTOR) 5 MG tablet Take 1 tablet (5 mg total) by mouth daily. Please keep upcoming appt in March 2023 with Dr. Harrington Challenger before anymore refills. Thank you   trimethoprim (TRIMPEX) 100 MG tablet Take 100 mg by mouth at bedtime.   [DISCONTINUED] b complex vitamins capsule Take 1 capsule by mouth daily.   [DISCONTINUED] CVS B-1 100 MG tablet Take 100 mg by mouth daily.   [DISCONTINUED] CVS STOOL SOFTENER 100 MG capsule Take 100 mg by  mouth as needed for constipation.   [DISCONTINUED] Cyanocobalamin (B-12) 1000 MCG LOZG Place 1 tablet under the tongue daily.   [DISCONTINUED] doxycycline (VIBRA-TABS) 100 MG tablet Take 100 mg by mouth 2 (two) times daily.   [DISCONTINUED] omeprazole (PRILOSEC) 20 MG capsule TAKE 1 CAPSULE BY MOUTH EVERY DAY   Facility-Administered Encounter Medications as of 02/02/2022  Medication   0.9 %  sodium chloride infusion    Review of Systems:  Review of Systems  Constitutional:  Negative for activity change, appetite change and unexpected weight change.  HENT: Negative.    Respiratory:  Negative for cough and shortness of breath.   Cardiovascular:  Positive for leg swelling.  Gastrointestinal:  Negative for constipation.  Genitourinary:  Positive for difficulty urinating. Negative for frequency.  Musculoskeletal:  Positive for gait problem. Negative for arthralgias and myalgias.  Skin: Negative.  Negative for rash.  Neurological:  Positive for numbness. Negative for dizziness and weakness.  Psychiatric/Behavioral:  Negative for confusion and sleep disturbance.   All other systems reviewed and are negative.  Health Maintenance  Topic Date Due   TETANUS/TDAP  11/22/2017   COLONOSCOPY (Pts 45-52yrs Insurance coverage will need to be confirmed)  02/21/2019   COVID-19 Vaccine (3 - Mixed Product risk series) 11/17/2020   Pneumonia Vaccine 74+ Years old  Completed   INFLUENZA VACCINE  Completed   Zoster Vaccines- Shingrix  Completed   HPV VACCINES  Aged Out    Physical Exam: Vitals:   02/02/22 1040  BP: 116/78  Pulse: 67  Temp: (!) 97.5 F (36.4 C)  SpO2: 97%  Weight: 193 lb 9.6 oz (87.8 kg)  Height: 5\' 10"  (1.778 m)   Body mass index is 27.78 kg/m. Physical Exam Vitals reviewed.  Constitutional:      Appearance: Normal appearance.  HENT:     Head: Normocephalic.     Mouth/Throat:     Mouth: Mucous membranes are moist.     Pharynx: Oropharynx is clear.  Eyes:     Pupils:  Pupils are equal, round, and reactive to  light.  Cardiovascular:     Rate and Rhythm: Normal rate and regular rhythm.     Pulses: Normal pulses.     Heart sounds: No murmur heard. Pulmonary:     Effort: Pulmonary effort is normal. No respiratory distress.     Breath sounds: Normal breath sounds. No rales.  Abdominal:     General: Abdomen is flat. Bowel sounds are normal.     Palpations: Abdomen is soft.  Musculoskeletal:     Cervical back: Neck supple.     Comments: Mild Edema Bilateral Has Chronic Venous changes in Both LE Also Feeble Peripheral Pulses.  Skin:    General: Skin is warm.  Neurological:     General: No focal deficit present.     Mental Status: He is alert and oriented to person, place, and time.     Comments: Walks with his cane   Psychiatric:        Mood and Affect: Mood normal.        Thought Content: Thought content normal.    Labs reviewed: Basic Metabolic Panel: No results for input(s): NA, K, CL, CO2, GLUCOSE, BUN, CREATININE, CALCIUM, MG, PHOS, TSH in the last 8760 hours. Liver Function Tests: No results for input(s): AST, ALT, ALKPHOS, BILITOT, PROT, ALBUMIN in the last 8760 hours. No results for input(s): LIPASE, AMYLASE in the last 8760 hours. No results for input(s): AMMONIA in the last 8760 hours. CBC: Recent Labs    07/28/21 1812  WBC 7.7  NEUTROABS 4.8  HGB 13.2  HCT 41.1  MCV 95.8  PLT 124*   Lipid Panel: No results for input(s): CHOL, HDL, LDLCALC, TRIG, CHOLHDL, LDLDIRECT in the last 8760 hours. Lab Results  Component Value Date   HGBA1C 5.2 04/10/2018    Procedures since last visit: No results found.  Assessment/Plan Peripheral neuropathy, idiopathic Follows with Neurology On Neurontin Was on B12 before but not taking it anymore Will check the level Also n Low dose of Valium Say he does not usually takes it Discussed about taking himself of it if possible Will leave it on his list  B12 deficiency Check the  level  NICM (nonischemic cardiomyopathy) ([place]) Follows with Cardiology Dr Harrington Challenger Low dose of Coreg Mixed hyperlipidemia Continue Crestor Check Lipid Flaccid neuropathic bladder, not elsewhere classified On Self Catheterize On Trimethoprim and Azo Primary insomnia Taking 2 tylenol PM I discussed risk of Anticholinergic effect He will try to cut it back to ine tab Hypothyroid Repeat TSH   Labs/tests ordered:  TSH,B12 level,CMP,CBC, Lipid Panel Next appt:  Visit date not found

## 2022-02-04 ENCOUNTER — Encounter: Payer: Self-pay | Admitting: Internal Medicine

## 2022-02-04 ENCOUNTER — Ambulatory Visit: Payer: PPO | Admitting: Internal Medicine

## 2022-02-04 ENCOUNTER — Other Ambulatory Visit: Payer: Self-pay

## 2022-02-04 VITALS — BP 102/60 | HR 70 | Ht 70.5 in | Wt 193.3 lb

## 2022-02-04 DIAGNOSIS — I2510 Atherosclerotic heart disease of native coronary artery without angina pectoris: Secondary | ICD-10-CM

## 2022-02-04 DIAGNOSIS — I428 Other cardiomyopathies: Secondary | ICD-10-CM

## 2022-02-04 NOTE — Patient Instructions (Signed)
Medication Instructions:  ?Your physician recommends that you continue on your current medications as directed. Please refer to the Current Medication list given to you today. ? ?*If you need a refill on your cardiac medications before your next appointment, please call your pharmacy* ? ? ?Lab Work: ?none ?If you have labs (blood work) drawn today and your tests are completely normal, you will receive your results only by: ?MyChart Message (if you have MyChart) OR ?A paper copy in the mail ?If you have any lab test that is abnormal or we need to change your treatment, we will call you to review the results. ? ? ?Testing/Procedures: ?none ? ? ?Follow-Up: ?At [facility], you and your health needs are our priority.  As part of our continuing mission to provide you with exceptional heart care, we have created designated Provider Care Teams.  These Care Teams include your primary Cardiologist (physician) and Advanced Practice Providers (APPs -  Physician Assistants and Nurse Practitioners) who all work together to provide you with the care you need, when you need it. ? ?We recommend signing up for the patient portal called "MyChart".  Sign up information is provided on this After Visit Summary.  MyChart is used to connect with patients for Virtual Visits (Telemedicine).  Patients are able to view lab/test results, encounter notes, upcoming appointments, etc.  Non-urgent messages can be sent to your provider as well.   ?To learn more about what you can do with MyChart, go to [email or web address].   ? ?Your next appointment:   ?10 month(s) ? ?The format for your next appointment:   ?In Person ? ?Provider:  DR. Dorris Carnes  ?{ ? ?

## 2022-02-08 DIAGNOSIS — E538 Deficiency of other specified B group vitamins: Secondary | ICD-10-CM | POA: Diagnosis not present

## 2022-02-08 DIAGNOSIS — E782 Mixed hyperlipidemia: Secondary | ICD-10-CM | POA: Diagnosis not present

## 2022-02-08 LAB — BASIC METABOLIC PANEL
BUN: 10 (ref 4–21)
CO2: 28 — AB (ref 13–22)
Chloride: 105 (ref 99–108)
Creatinine: 0.7 (ref 0.6–1.3)
Glucose: 91
Potassium: 4.2 mEq/L (ref 3.5–5.1)
Sodium: 141 (ref 137–147)

## 2022-02-08 LAB — COMPREHENSIVE METABOLIC PANEL
Albumin: 4.1 (ref 3.5–5.0)
Calcium: 9.3 (ref 8.7–10.7)
Globulin: 2.2

## 2022-02-08 LAB — LIPID PANEL
Cholesterol: 136 (ref 0–200)
HDL: 59 (ref 35–70)
LDL Cholesterol: 63
Triglycerides: 70 (ref 40–160)

## 2022-02-08 LAB — TSH: TSH: 8.96 — AB (ref 0.41–5.90)

## 2022-02-08 LAB — CBC AND DIFFERENTIAL
HCT: 39 — AB (ref 41–53)
Hemoglobin: 12.4 — AB (ref 13.5–17.5)
Platelets: 85 10*3/uL — AB (ref 150–400)
WBC: 3.7

## 2022-02-08 LAB — CBC: RBC: 4.13 (ref 3.87–5.11)

## 2022-02-08 LAB — VITAMIN B12: Vitamin B-12: 254

## 2022-02-09 ENCOUNTER — Telehealth: Payer: Self-pay

## 2022-02-09 NOTE — Telephone Encounter (Signed)
-----   Message from [name], MD sent at 02/09/2022  4:04 PM EST ----- ?No this one needs to be seen ?----- Message ----- ?From: Charlyne Petrin, CMA ?Sent: 02/09/2022   3:58 PM EST ?To: [name], MD ? ?Per Dr. Victorino December need to call patient.  ? ?----- Message ----- ?From: [name], MD ?Sent: 02/09/2022  12:37 PM EST ?To: Charlyne Petrin, CMA ? ?Can you schedule him next week with me to discuss his labs ? ? ? ? ?

## 2022-02-09 NOTE — Telephone Encounter (Signed)
Scheduled patient for 02/16/22 at [place].  ?LMOM to return call to give appointment date to review labs.  ?

## 2022-02-10 ENCOUNTER — Other Ambulatory Visit: Payer: Self-pay | Admitting: Internal Medicine

## 2022-02-16 ENCOUNTER — Other Ambulatory Visit: Payer: Self-pay

## 2022-02-16 ENCOUNTER — Non-Acute Institutional Stay: Payer: PPO | Admitting: Internal Medicine

## 2022-02-16 ENCOUNTER — Encounter: Payer: Self-pay | Admitting: Internal Medicine

## 2022-02-16 VITALS — BP 152/98 | HR 68 | Temp 97.9°F | Ht 70.0 in | Wt 197.2 lb

## 2022-02-16 DIAGNOSIS — J309 Allergic rhinitis, unspecified: Secondary | ICD-10-CM | POA: Diagnosis not present

## 2022-02-16 DIAGNOSIS — N312 Flaccid neuropathic bladder, not elsewhere classified: Secondary | ICD-10-CM

## 2022-02-16 DIAGNOSIS — E538 Deficiency of other specified B group vitamins: Secondary | ICD-10-CM

## 2022-02-16 DIAGNOSIS — E782 Mixed hyperlipidemia: Secondary | ICD-10-CM | POA: Diagnosis not present

## 2022-02-16 DIAGNOSIS — E039 Hypothyroidism, unspecified: Secondary | ICD-10-CM | POA: Diagnosis not present

## 2022-02-16 DIAGNOSIS — G609 Hereditary and idiopathic neuropathy, unspecified: Secondary | ICD-10-CM

## 2022-02-16 DIAGNOSIS — K862 Cyst of pancreas: Secondary | ICD-10-CM

## 2022-02-16 DIAGNOSIS — D696 Thrombocytopenia, unspecified: Secondary | ICD-10-CM | POA: Diagnosis not present

## 2022-02-16 DIAGNOSIS — K2970 Gastritis, unspecified, without bleeding: Secondary | ICD-10-CM

## 2022-02-16 DIAGNOSIS — I428 Other cardiomyopathies: Secondary | ICD-10-CM | POA: Diagnosis not present

## 2022-02-16 DIAGNOSIS — F1090 Alcohol use, unspecified, uncomplicated: Secondary | ICD-10-CM

## 2022-02-16 NOTE — Progress Notes (Signed)
? ?Location: [place] ?  ?Place of Service:  Clinic (12) ? ?Provider:  ? ?Code Status:  ?Goals of Care:  ?Advanced Directives 02/16/2022  ?Does Patient Have a Medical Advance Directive? Yes  ?Type of Advance Directive Living will;Healthcare Power of Attorney  ?Does patient want to make changes to medical advance directive? No - Patient declined  ?Copy of [place] in Chart? No - copy requested  ?Would patient like information on creating a medical advance directive? -  ?Pre-existing out of facility DNR order (yellow form or pink MOST form) -  ? ? ? ?Chief Complaint  ?Patient presents with  ? Medical Management of Chronic Issues  ?  Patient here today to discuss labs.   ? Quality Metric Gaps  ?  Verified Matrix and NCIR patient is due for #4 Covid vaccine and colonoscopy. ?  ? ? ?HPI: Patient is a 88 y.o. male seen today for an acute visit for Discuss Labs ? ?History of peripheral neuropathy sees Dr. Posey Pronto.  On gabapentin.  Just takes it at night ?Has a history of nonobstructive CAD, nonischemic cardiomyopathy, left bundle branch block and HLD ?Last echo showed EF of 55 to 60% with mild MR ?History of flaccid neurogenic bladder failed catheterize at least once or twice in a day ?Recurrent UTIs follows with urology ?History of insomnia with restless leg.  Is on Valium and Tylenol PM ?H/o Recurent Cellulitis ? ? ?He had regular labs done and His Platelets are low ?His CT scan in 2020 did not show any Splenomegaly ?But patient has h/o drinking. He says he drinks 2 ounces of Vodka and Number of glasses of wine at night during social hour in [facility] ? ?Patient also had complaints of runny nose with coughing at night ?He also was complaining of some epigastric discomfort.  Mostly related with drinking wine and coffee. ? ? ? ?Lives with his wife in [place] and [facility].  Uses cane to help him walk due to his neuropathy.  Is able to still drive.  No recent falls stays independent in  his ADLs and IADLs ?Past Medical History:  ?Diagnosis Date  ? Alcoholic peripheral neuropathy ([place])   ? neurologist-  dr patel--- feet numbness and sensory ataxia  ? Arthritis   ? B12 deficiency   ? BPH (benign prostatic hyperplasia)   ? CAD (coronary artery disease) cardiolgoist-  dr Harrell Gave end (previouly dr dalton [name])  ? a. Myoview 11/13:  EF 38%, inf and IS defect c/w scar but no ischemia:  b. Cardiac CT 11/13:  Ca score 318 Agatson units, pLAD and dCFX plaque;   c. LHC 11/07/12:  pLAD 30%, oD1 40%, oCFX 30%, dCFX 40-50%, mRCA 40%, EF 50% (frequent PVCs and short run of NSVT with injection/    per last echo 01/ 2017  ef 55-60%  ? Chronic constipation   ? Diverticulosis of colon   ? Esophageal reflux   ? External hemorrhoids   ? First degree heart block   ? Hiatal hernia   ? History of adenomatous polyp of colon   ? History of esophageal stricture 08/11/2015  ? s/p  dilatation  ? History of lower GI bleeding 03/01/2017  ? s/p  flexiable simoidscopy w/ clipping rectum ulcer  ? Hypogonadism male   ? Hypothyroidism   ? Interstitial cystitis   ? Irritable bowel syndrome   ? LBBB (left bundle branch block)   ? NICM (nonischemic cardiomyopathy) [facility]) cardiologist-  dr Harrell Gave end (previously dr  dalton mclean)--  per last echo 01/ 2017  ef 55-60%  ? ? 2/2 LBBB => a. echo 11/13: diff HK, worse in septum and apex, mod LVE, mild LVH, EF 40%, mild AI, mild MR, mod LAE  ? Pre-diabetes   ? Self-catheterizes urinary bladder   ? bid to tid   ? Wears glasses   ? Wears hearing aid in both ears   ? ? ?Past Surgical History:  ?Procedure Laterality Date  ? CARDIAC CATHETERIZATION  11-07-2012    dr [name]  ? nonobstructive CAD (pLAD 30%, ostial D1 40%, ostial LCx 30%, dLCx diffuse 40-50%, mRCA 40%) ;  LVSF 50% but diffiult due to PVCs and short run VT with injection;  cardiomyopathy mostly likely a LBBB cardiomyopathy  ? CARDIOVASCULAR STRESS TEST  10-16-2012   dr [name]  ? Low risk adenosine nuclear study (no exercise) w/  a fixed inferior and inferoseptal defect without ischemia (question as whether this abnormality due to LBBB cardiomyopathy or due to scar)/  LV ef 38%,  LV wall motion decreased of the septum and entire apex  ? CATARACT EXTRACTION W/ INTRAOCULAR LENS  IMPLANT, BILATERAL  2012  approx.  ? COLONOSCOPY  2016  ? Dr. Henrene Pastor, Per new patient form  ? CYSTO/  HYDRODISTENTION/  BLADDER BIOPSY  04-20-2005    dr Lawerance Bach  [facility]  ? ETHMOIDECTOMY Right 12/12/2016  ? Procedure: RIGHT ENDOSCOPIC ETHMOIDECTOMY;  Surgeon: [name], MD;  Location: [place];  Service: ENT;  Laterality: Right;  ? FLEXIBLE SIGMOIDOSCOPY N/A 03/02/2017  ? Procedure: FLEXIBLE SIGMOIDOSCOPY;  Surgeon: Ladene Artist, MD;  [facility];  Service: Endoscopy;  Laterality: N/A;  ? [place]  ? MAXILLARY ANTROSTOMY Right 12/12/2016  ? Procedure: RIGHT ENDOSCOPIC MAXILLARY ANTROSTOMY;  Surgeon: [name], MD;  Location: [place];  Service: ENT;  Laterality: Right;  ? SINUS ENDO W/FUSION Right 12/12/2016  ? Procedure: ENDOSCOPIC SINUS SURGERY WITH NAVIGATION;  Surgeon: [name], MD;  Location: [place];  Service: ENT;  Laterality: Right;  ? SPHENOIDECTOMY Right 12/12/2016  ? Procedure: RIGHT ENDOSCOPIC SPHENOIDECTOMY;  Surgeon: [name], MD;  Location: [place];  Service: ENT;  Laterality: Right;  ? TONSILLECTOMY AND ADENOIDECTOMY  child  ? TOTAL KNEE ARTHROPLASTY Left 10/14/2014  ? Procedure: LEFT TOTAL KNEE ARTHROPLASTY;  Surgeon: [name], MD;  [facility];  Service: Orthopedics;  Laterality: Left;  ? TOTAL KNEE ARTHROPLASTY Right 1990s  ? TRANSTHORACIC ECHOCARDIOGRAM  12-24-2015   dr [name]  ? ef 55-60%,  grade 1 diastolic dysfunction/  trivial AR and TR  mild MR and PR/  moderate LAE  ? TRANSURETHRAL RESECTION OF PROSTATE  06-11-2010   dr Gaynelle Arabian  [facility]  ? w/  GYRUS  ? TRANSURETHRAL RESECTION OF PROSTATE N/A 12/06/2017  ? Procedure: TRANSURETHRAL RESECTION OF  THE PROSTATE (TURP)/ BIPOLAR;  Surgeon: Ceasar Mons, MD;  Location: [facility];  Service: Urology;  Laterality: N/A;  ? TRANSURETHRAL RESECTION OF PROSTATE N/A 04/11/2018  ? Procedure: TRANSURETHRAL RESECTION OF THE PROSTATE (TURP);  Surgeon: Ceasar Mons, MD;  [facility];  Service: Urology;  Laterality: N/A;  ? UPPER GASTROINTESTINAL ENDOSCOPY    ? ? ?Allergies  ?Allergen Reactions  ? Cyclobenzaprine Other (See Comments)  ?  Lost balance  ? Lisinopril Other (See Comments)  ?  Affected ability to urinate  ? Relafen [Nabumetone] Other (See Comments)  ?  Problems urinating afterwards   ?  Statins Other (See Comments)  ?  "my peeing"  ? ? ?Outpatient Encounter Medications as of 02/16/2022  ?Medication Sig  ? acetaminophen (TYLENOL) 325 MG tablet Take 2 tablets (650 mg total) by mouth every 6 (six) hours as needed for mild pain.  ? aspirin EC 81 MG tablet Take 81 mg by mouth daily.  ? AZO-CRANBERRY PO Take 1 tablet by mouth daily.  ? carvedilol (COREG) 6.25 MG tablet TAKE 1 TABLET BY MOUTH TWICE A DAY  ? diazepam (VALIUM) 5 MG tablet Take 2.5 mg by mouth 2 (two) times daily.  ? diphenhydramine-acetaminophen (TYLENOL PM) 25-500 MG TABS tablet Take 1 tablet by mouth at bedtime.   ? gabapentin (NEURONTIN) 300 MG capsule Take 300 mg by mouth daily. Pt takes 1 tablet 300 mg once a day.  ? levothyroxine (SYNTHROID) 25 MCG tablet Take 25 mcg by mouth every evening.   ? omeprazole (PRILOSEC) 20 MG capsule Take 1 capsule (20 mg total) by mouth daily. Office visit for further refills  ? polyethylene glycol (MIRALAX / GLYCOLAX) packet Take 17 g by mouth daily as needed for mild constipation.  ? Probiotic Product (PROBIOTIC PO) Take 1 capsule by mouth daily.  ? rosuvastatin (CRESTOR) 5 MG tablet Take 1 tablet (5 mg total) by mouth daily. Please keep upcoming appt in March 2023 with Dr. Harrington Challenger before anymore refills. Thank you  ? trimethoprim (TRIMPEX) 100 MG tablet Take 100 mg by mouth  at bedtime.  ? ?Facility-Administered Encounter Medications as of 02/16/2022  ?Medication  ? 0.9 %  sodium chloride infusion  ? ? ?Review of Systems:  ?Review of Systems  ?Constitutional:  Positive fo

## 2022-02-16 NOTE — Patient Instructions (Signed)
I am making Hematology Referal for your Blood Count ?I am ordering CT scan of your Abdomen to follow your Pancreatic Cyst ?Take your Levothyroxine in the morning 30 min before eating or Drinking ?Vit B12 1000 Mcg Over the counter  ?Can try Flonase for Nose for runny nose ?Prilosec Over the counter as needed for Stomach Pain ?Quit Drinking Wine  ? ?

## 2022-02-21 DIAGNOSIS — L03115 Cellulitis of right lower limb: Secondary | ICD-10-CM | POA: Diagnosis not present

## 2022-02-23 ENCOUNTER — Telehealth: Payer: Self-pay | Admitting: Nurse Practitioner

## 2022-02-23 ENCOUNTER — Other Ambulatory Visit: Payer: Self-pay | Admitting: *Deleted

## 2022-02-23 DIAGNOSIS — D696 Thrombocytopenia, unspecified: Secondary | ICD-10-CM

## 2022-02-23 NOTE — Progress Notes (Signed)
New patient appt scheduled and lab orders entered ?

## 2022-02-23 NOTE — Telephone Encounter (Signed)
Called 1x, attempted to contact patient to schedule initial visit from referral. No answer so voicemail was left for patient to call back. ?

## 2022-02-24 ENCOUNTER — Telehealth: Payer: Self-pay | Admitting: Nurse Practitioner

## 2022-02-24 NOTE — Telephone Encounter (Signed)
Contacted patient to schedule initial visit from referral. Patient is aware of dates and times.  ?

## 2022-03-01 DIAGNOSIS — M19071 Primary osteoarthritis, right ankle and foot: Secondary | ICD-10-CM | POA: Diagnosis not present

## 2022-03-01 DIAGNOSIS — M79671 Pain in right foot: Secondary | ICD-10-CM | POA: Diagnosis not present

## 2022-03-01 DIAGNOSIS — L03115 Cellulitis of right lower limb: Secondary | ICD-10-CM | POA: Diagnosis not present

## 2022-03-02 DIAGNOSIS — E538 Deficiency of other specified B group vitamins: Secondary | ICD-10-CM | POA: Diagnosis not present

## 2022-03-02 DIAGNOSIS — F1020 Alcohol dependence, uncomplicated: Secondary | ICD-10-CM | POA: Diagnosis not present

## 2022-03-02 DIAGNOSIS — I4891 Unspecified atrial fibrillation: Secondary | ICD-10-CM | POA: Diagnosis not present

## 2022-03-02 DIAGNOSIS — E559 Vitamin D deficiency, unspecified: Secondary | ICD-10-CM | POA: Diagnosis not present

## 2022-03-02 DIAGNOSIS — G63 Polyneuropathy in diseases classified elsewhere: Secondary | ICD-10-CM | POA: Diagnosis not present

## 2022-03-02 DIAGNOSIS — E039 Hypothyroidism, unspecified: Secondary | ICD-10-CM | POA: Diagnosis not present

## 2022-03-02 DIAGNOSIS — E038 Other specified hypothyroidism: Secondary | ICD-10-CM | POA: Diagnosis not present

## 2022-03-02 DIAGNOSIS — E663 Overweight: Secondary | ICD-10-CM | POA: Diagnosis not present

## 2022-03-02 DIAGNOSIS — D6869 Other thrombophilia: Secondary | ICD-10-CM | POA: Diagnosis not present

## 2022-03-02 DIAGNOSIS — I429 Cardiomyopathy, unspecified: Secondary | ICD-10-CM | POA: Diagnosis not present

## 2022-03-02 DIAGNOSIS — D696 Thrombocytopenia, unspecified: Secondary | ICD-10-CM | POA: Diagnosis not present

## 2022-03-02 DIAGNOSIS — E785 Hyperlipidemia, unspecified: Secondary | ICD-10-CM | POA: Diagnosis not present

## 2022-03-07 ENCOUNTER — Inpatient Hospital Stay: Payer: PPO | Attending: Oncology

## 2022-03-07 DIAGNOSIS — E538 Deficiency of other specified B group vitamins: Secondary | ICD-10-CM | POA: Diagnosis not present

## 2022-03-07 DIAGNOSIS — D649 Anemia, unspecified: Secondary | ICD-10-CM | POA: Insufficient documentation

## 2022-03-07 DIAGNOSIS — D696 Thrombocytopenia, unspecified: Secondary | ICD-10-CM | POA: Diagnosis not present

## 2022-03-07 LAB — CBC WITH DIFFERENTIAL (CANCER CENTER ONLY)
Abs Immature Granulocytes: 0.05 10*3/uL (ref 0.00–0.07)
Basophils Absolute: 0 10*3/uL (ref 0.0–0.1)
Basophils Relative: 1 %
Eosinophils Absolute: 0.1 10*3/uL (ref 0.0–0.5)
Eosinophils Relative: 2 %
HCT: 40.3 % (ref 39.0–52.0)
Hemoglobin: 12.6 g/dL — ABNORMAL LOW (ref 13.0–17.0)
Immature Granulocytes: 1 %
Lymphocytes Relative: 38 %
Lymphs Abs: 2.3 10*3/uL (ref 0.7–4.0)
MCH: 29.7 pg (ref 26.0–34.0)
MCHC: 31.3 g/dL (ref 30.0–36.0)
MCV: 95 fL (ref 80.0–100.0)
Monocytes Absolute: 0.4 10*3/uL (ref 0.1–1.0)
Monocytes Relative: 7 %
Neutro Abs: 3.1 10*3/uL (ref 1.7–7.7)
Neutrophils Relative %: 51 %
Platelet Count: 134 10*3/uL — ABNORMAL LOW (ref 150–400)
RBC: 4.24 MIL/uL (ref 4.22–5.81)
RDW: 19 % — ABNORMAL HIGH (ref 11.5–15.5)
WBC Count: 6 10*3/uL (ref 4.0–10.5)
nRBC: 0.5 % — ABNORMAL HIGH (ref 0.0–0.2)

## 2022-03-07 LAB — SAVE SMEAR(SSMR), FOR PROVIDER SLIDE REVIEW

## 2022-03-08 DIAGNOSIS — N139 Obstructive and reflux uropathy, unspecified: Secondary | ICD-10-CM | POA: Diagnosis not present

## 2022-03-08 DIAGNOSIS — R3914 Feeling of incomplete bladder emptying: Secondary | ICD-10-CM | POA: Diagnosis not present

## 2022-03-08 DIAGNOSIS — N312 Flaccid neuropathic bladder, not elsewhere classified: Secondary | ICD-10-CM | POA: Diagnosis not present

## 2022-03-08 NOTE — Progress Notes (Signed)
New Hematology/Oncology Consult ? ? ?Requesting MD: Dr. Veleta Miners ? ?[phone] ? ?   ? ?Reason for Consult: Thrombocytopenia ? ?HPI: [name] was referred for evaluation of thrombocytopenia.  CBC from 02/08/2022 returned with a platelet count of 85,000, hemoglobin 12.4.  Review of CBCs in the EMR show intermittent thrombocytopenia dating to 06/12/2010 at which time the platelet count was 76,000.  On 11/05/2012 the platelet count was 145,000; 03/13/2017 platelet count 160,000; 03/27/2017 platelet count 122,000; 04/10/2018 platelet count 100,000; 07/28/2021 platelet count 124,000. ? ? ? ?Past Medical History:  ?Diagnosis Date  ? Alcoholic peripheral neuropathy ([place])   ? neurologist-  dr patel--- feet numbness and sensory ataxia  ? Arthritis   ? B12 deficiency   ? BPH (benign prostatic hyperplasia)   ? CAD (coronary artery disease) cardiolgoist-  dr Harrell Gave end (previouly dr dalton [name])  ? a. Myoview 11/13:  EF 38%, inf and IS defect c/w scar but no ischemia:  b. Cardiac CT 11/13:  Ca score 318 Agatson units, pLAD and dCFX plaque;   c. LHC 11/07/12:  pLAD 30%, oD1 40%, oCFX 30%, dCFX 40-50%, mRCA 40%, EF 50% (frequent PVCs and short run of NSVT with injection/    per last echo 01/ 2017  ef 55-60%  ? Chronic constipation   ? Diverticulosis of colon   ? Esophageal reflux   ? External hemorrhoids   ? First degree heart block   ? Hiatal hernia   ? History of adenomatous polyp of colon   ? History of esophageal stricture 08/11/2015  ? s/p  dilatation  ? History of lower GI bleeding 03/01/2017  ? s/p  flexiable simoidscopy w/ clipping rectum ulcer  ? Hypogonadism male   ? Hypothyroidism   ? Interstitial cystitis   ? Irritable bowel syndrome   ? LBBB (left bundle branch block)   ? NICM (nonischemic cardiomyopathy) [facility]) cardiologist-  dr Harrell Gave end (previously dr dalton [name])--  per last echo 01/ 2017  ef 55-60%  ? ? 2/2 LBBB => a. echo 11/13: diff HK, worse in septum and apex, mod LVE, mild LVH, EF 40%, mild AI,  mild MR, mod LAE  ? Pre-diabetes   ? Self-catheterizes urinary bladder   ? bid to tid   ? Wears glasses   ? Wears hearing aid in both ears   ?: ? ? ?Past Surgical History:  ?Procedure Laterality Date  ? CARDIAC CATHETERIZATION  11-07-2012    dr [name]  ? nonobstructive CAD (pLAD 30%, ostial D1 40%, ostial LCx 30%, dLCx diffuse 40-50%, mRCA 40%) ;  LVSF 50% but diffiult due to PVCs and short run VT with injection;  cardiomyopathy mostly likely a LBBB cardiomyopathy  ? CARDIOVASCULAR STRESS TEST  10-16-2012   dr [name]  ? Low risk adenosine nuclear study (no exercise) w/ a fixed inferior and inferoseptal defect without ischemia (question as whether this abnormality due to LBBB cardiomyopathy or due to scar)/  LV ef 38%,  LV wall motion decreased of the septum and entire apex  ? CATARACT EXTRACTION W/ INTRAOCULAR LENS  IMPLANT, BILATERAL  2012  approx.  ? COLONOSCOPY  2016  ? Dr. Henrene Pastor, Per new patient form  ? CYSTO/  HYDRODISTENTION/  BLADDER BIOPSY  04-20-2005    dr Lawerance Bach  [facility]  ? ETHMOIDECTOMY Right 12/12/2016  ? Procedure: RIGHT ENDOSCOPIC ETHMOIDECTOMY;  Surgeon: [name], MD;  Location: [place];  Service: ENT;  Laterality: Right;  ? FLEXIBLE SIGMOIDOSCOPY N/A 03/02/2017  ? Procedure:  FLEXIBLE SIGMOIDOSCOPY;  Surgeon: Ladene Artist, MD;  Location: [facility];  Service: Endoscopy;  Laterality: N/A;  ? [place]  ? MAXILLARY ANTROSTOMY Right 12/12/2016  ? Procedure: RIGHT ENDOSCOPIC MAXILLARY ANTROSTOMY;  Surgeon: [name], MD;  Location: [place];  Service: ENT;  Laterality: Right;  ? SINUS ENDO W/FUSION Right 12/12/2016  ? Procedure: ENDOSCOPIC SINUS SURGERY WITH NAVIGATION;  Surgeon: [name], MD;  Location: [place];  Service: ENT;  Laterality: Right;  ? SPHENOIDECTOMY Right 12/12/2016  ? Procedure: RIGHT ENDOSCOPIC SPHENOIDECTOMY;  Surgeon: [name], MD;  Location: [place];  Service: ENT;  Laterality: Right;  ?  TONSILLECTOMY AND ADENOIDECTOMY  child  ? TOTAL KNEE ARTHROPLASTY Left 10/14/2014  ? Procedure: LEFT TOTAL KNEE ARTHROPLASTY;  Surgeon: [name], MD;  [facility];  Service: Orthopedics;  Laterality: Left;  ? TOTAL KNEE ARTHROPLASTY Right 1990s  ? TRANSTHORACIC ECHOCARDIOGRAM  12-24-2015   dr [name]  ? ef 55-60%,  grade 1 diastolic dysfunction/  trivial AR and TR  mild MR and PR/  moderate LAE  ? TRANSURETHRAL RESECTION OF PROSTATE  06-11-2010   dr Gaynelle Arabian  [facility]  ? w/  GYRUS  ? TRANSURETHRAL RESECTION OF PROSTATE N/A 12/06/2017  ? Procedure: TRANSURETHRAL RESECTION OF THE PROSTATE (TURP)/ BIPOLAR;  Surgeon: Ceasar Mons, MD;  Location[facility];  Service: Urology;  Laterality: N/A;  ? TRANSURETHRAL RESECTION OF PROSTATE N/A 04/11/2018  ? Procedure: TRANSURETHRAL RESECTION OF THE PROSTATE (TURP);  Surgeon: Ceasar Mons, MD;  [facility];  Service: Urology;  Laterality: N/A;  ? UPPER GASTROINTESTINAL ENDOSCOPY    ?: ? ? ?Current Outpatient Medications:  ?  acetaminophen (TYLENOL) 325 MG tablet, Take 2 tablets (650 mg total) by mouth every 6 (six) hours as needed for mild pain., Disp: 30 tablet, Rfl: 0 ?  aspirin EC 81 MG tablet, Take 81 mg by mouth daily., Disp: , Rfl:  ?  AZO-CRANBERRY PO, Take 1 tablet by mouth daily., Disp: , Rfl:  ?  carvedilol (COREG) 6.25 MG tablet, TAKE 1 TABLET BY MOUTH TWICE A DAY, Disp: 180 tablet, Rfl: 0 ?  Cyanocobalamin (VITAMIN B12) 1000 MCG TBCR, Take 1,000 mcg by mouth., Disp: , Rfl:  ?  diphenhydramine-acetaminophen (TYLENOL PM) 25-500 MG TABS tablet, Take 1 tablet by mouth at bedtime. , Disp: , Rfl:  ?  gabapentin (NEURONTIN) 300 MG capsule, Take 300 mg by mouth daily. Pt takes 1 tablet 300 mg once a day., Disp: , Rfl:  ?  levothyroxine (SYNTHROID) 25 MCG tablet, Take 25 mcg by mouth every evening. , Disp: , Rfl:  ?  omeprazole (PRILOSEC) 20 MG capsule, Take 1 capsule (20 mg total) by mouth daily. Office visit for further  refills, Disp: 90 capsule, Rfl: 0 ?  polyethylene glycol (MIRALAX / GLYCOLAX) packet, Take 17 g by mouth daily as needed for mild constipation., Disp: , Rfl:  ?  Probiotic Product (PROBIOTIC PO), Take 1 capsule by mouth daily., Disp: , Rfl:  ?  rosuvastatin (CRESTOR) 5 MG tablet, TAKE 1 TABLET BY MOUTH DAILY. PLEASE KEEP UPCOMING APPT IN MARCH 2023 BEFORE MORE REFILLS, Disp: 90 tablet, Rfl: 3 ?  trimethoprim (TRIMPEX) 100 MG tablet, Take 100 mg by mouth at bedtime., Disp: , Rfl:  ?  diazepam (VALIUM) 5 MG tablet, Take 2.5 mg by mouth 2 (two) times daily. (Patient not taking: Reported on 03/09/2022), Disp: , Rfl:  ? ?Current Facility-Administered Medications:  ?  0.9 %  sodium chloride infusion, 500 mL, Intravenous, Once, Irene Shipper, MD: ? ? ? ? ?Allergies  ?Allergen Reactions  ? Cyclobenzaprine Other (See Comments)  ?  Lost balance  ? Lisinopril Other (See Comments)  ?  Affected ability to urinate  ? Relafen [Nabumetone] Other (See Comments)  ?  Problems urinating afterwards   ? Statins Other (See Comments)  ?  "my peeing"  ? ? ?SOCIAL HISTORY: He lives in [place] at [place].  He has 3 children.  He is retired from the [employer].  He quit smoking at age 34.  He drinks 3 to 4 ounces of vodka every evening.  He has quit drinking intermittently in the past.  He has been drinking for as many years as he can remember. ? ?Review of Systems: He reports appetite is stable.  No weight loss.  No fevers or sweats.  No unusual bruising or bleeding.  No recurrent infections.  He has had dysphagia periodically in the past, had to have "throat stretched".  He coughs frequently.  No shortness of breath.  No chest pain.  No change in bowel habits.  He self catheterizes.  He has neuropathy with tingling/numbness in the feet. ? ?Physical Exam: ? ?Blood pressure 139/71, pulse 65, temperature 97.8 ?F (36.6 ?C), temperature source Oral, resp. rate 18, height '5\' 10"'$  (1.778 m), weight 189 lb (85.7 kg), SpO2 99 %. ? ?HEENT:  Sclera anicteric.  Oropharynx without thrush or ulceration. ?Lungs: Distant breath sounds.  No respiratory distress. ?Cardiac: Regular rate and rhythm. ?Abdomen: No hepatosplenomegaly. ?Vascular: Trace bilateral lowe

## 2022-03-09 ENCOUNTER — Telehealth: Payer: Self-pay

## 2022-03-09 ENCOUNTER — Other Ambulatory Visit: Payer: Self-pay

## 2022-03-09 ENCOUNTER — Inpatient Hospital Stay: Payer: PPO | Admitting: Nurse Practitioner

## 2022-03-09 ENCOUNTER — Other Ambulatory Visit: Payer: Self-pay | Admitting: Internal Medicine

## 2022-03-09 ENCOUNTER — Encounter: Payer: Self-pay | Admitting: Nurse Practitioner

## 2022-03-09 VITALS — BP 139/71 | HR 65 | Temp 97.8°F | Resp 18 | Ht 70.0 in | Wt 189.0 lb

## 2022-03-09 DIAGNOSIS — Z79899 Other long term (current) drug therapy: Secondary | ICD-10-CM | POA: Diagnosis not present

## 2022-03-09 DIAGNOSIS — I2510 Atherosclerotic heart disease of native coronary artery without angina pectoris: Secondary | ICD-10-CM

## 2022-03-09 DIAGNOSIS — E039 Hypothyroidism, unspecified: Secondary | ICD-10-CM

## 2022-03-09 DIAGNOSIS — D696 Thrombocytopenia, unspecified: Secondary | ICD-10-CM

## 2022-03-09 DIAGNOSIS — D649 Anemia, unspecified: Secondary | ICD-10-CM | POA: Diagnosis not present

## 2022-03-09 DIAGNOSIS — E538 Deficiency of other specified B group vitamins: Secondary | ICD-10-CM

## 2022-03-09 NOTE — Telephone Encounter (Signed)
Patient schedule for lab 03/14/22 ?

## 2022-03-09 NOTE — Telephone Encounter (Signed)
Called the patient to schedule him a lab (myeloma) for next week, no answer left a vm will call back in the am. ?

## 2022-03-10 ENCOUNTER — Encounter: Payer: Self-pay | Admitting: Podiatry

## 2022-03-10 ENCOUNTER — Ambulatory Visit: Payer: PPO | Admitting: Podiatry

## 2022-03-10 DIAGNOSIS — M79675 Pain in left toe(s): Secondary | ICD-10-CM

## 2022-03-10 DIAGNOSIS — B351 Tinea unguium: Secondary | ICD-10-CM | POA: Diagnosis not present

## 2022-03-10 DIAGNOSIS — M79674 Pain in right toe(s): Secondary | ICD-10-CM

## 2022-03-10 LAB — KAPPA/LAMBDA LIGHT CHAINS
Kappa free light chain: 21.7 mg/L — ABNORMAL HIGH (ref 3.3–19.4)
Kappa, lambda light chain ratio: 1.37 (ref 0.26–1.65)
Lambda free light chains: 15.8 mg/L (ref 5.7–26.3)

## 2022-03-10 NOTE — Progress Notes (Signed)
Subjective:  ? ?Patient ID: David Gutierrez, male   DOB: 88 y.o.   MRN: [id number]  ? ?HPI ?Patient has had a history of cellulitis and has nail disease with incurvation of the beds that he cannot reach and they are concerned about pedicures for him and want [place] to take care of it currently.  Patient does not smoke likes to be active ? ? ?Review of Systems  ?All other systems reviewed and are negative. ? ? ?   ?Objective:  ?Physical Exam ?Vitals and nursing note reviewed.  ?Constitutional:   ?   Appearance: He is well-developed.  ?Pulmonary:  ?   Effort: Pulmonary effort is normal.  ?Musculoskeletal:     ?   General: Normal range of motion.  ?Skin: ?   General: Skin is warm.  ?Neurological:  ?   Mental Status: He is alert.  ?  ?Neurovascular status intact muscle strength adequate range of motion within normal limits with patient found to have elongated thickened nailbeds 1-5 both feet that have become incurvated with history of pedicures that did do a good job of controlling them no indication cellulitis currently ? ?   ?Assessment:  ?Chronic mycotic nail infection with pain 1-5 both feet ? ?   ?Plan:  ?Debridement nailbeds 1-5 both feet H&P done and I do think that he can have pedicures in the future.  I reviewed condition with him patient tolerated well no iatrogenic bleeding reappoint as needed ?   ? ? ?

## 2022-03-11 ENCOUNTER — Ambulatory Visit
Admission: RE | Admit: 2022-03-11 | Discharge: 2022-03-11 | Disposition: A | Discharge status: Home / Self Care | Payer: PPO | Source: Ambulatory Visit | Attending: Internal Medicine | Admitting: Internal Medicine

## 2022-03-11 DIAGNOSIS — K7689 Other specified diseases of liver: Secondary | ICD-10-CM | POA: Diagnosis not present

## 2022-03-11 DIAGNOSIS — N281 Cyst of kidney, acquired: Secondary | ICD-10-CM | POA: Diagnosis not present

## 2022-03-11 DIAGNOSIS — K5730 Diverticulosis of large intestine without perforation or abscess without bleeding: Secondary | ICD-10-CM | POA: Diagnosis not present

## 2022-03-11 DIAGNOSIS — K862 Cyst of pancreas: Secondary | ICD-10-CM | POA: Diagnosis not present

## 2022-03-11 MED ORDER — IOPAMIDOL (ISOVUE-300) INJECTION 61%
100.0000 mL | Freq: Once | INTRAVENOUS | Status: AC | PRN
  Administered 2022-03-11: 100 mL via INTRAVENOUS

## 2022-03-14 ENCOUNTER — Other Ambulatory Visit: Payer: Self-pay

## 2022-03-14 ENCOUNTER — Other Ambulatory Visit: Payer: Self-pay | Admitting: *Deleted

## 2022-03-14 ENCOUNTER — Inpatient Hospital Stay: Payer: PPO

## 2022-03-14 DIAGNOSIS — D696 Thrombocytopenia, unspecified: Secondary | ICD-10-CM

## 2022-03-21 ENCOUNTER — Other Ambulatory Visit: Payer: Self-pay | Admitting: Internal Medicine

## 2022-03-21 ENCOUNTER — Other Ambulatory Visit: Payer: Self-pay | Admitting: Neurology

## 2022-03-23 LAB — MULTIPLE MYELOMA PANEL, SERUM
Albumin SerPl Elph-Mcnc: 3.7 g/dL (ref 2.9–4.4)
Albumin/Glob SerPl: 1.5 (ref 0.7–1.7)
Alpha 1: 0.2 g/dL (ref 0.0–0.4)
Alpha2 Glob SerPl Elph-Mcnc: 0.5 g/dL (ref 0.4–1.0)
B-Globulin SerPl Elph-Mcnc: 0.8 g/dL (ref 0.7–1.3)
Gamma Glob SerPl Elph-Mcnc: 1 g/dL (ref 0.4–1.8)
Globulin, Total: 2.5 g/dL (ref 2.2–3.9)
IgA: 285 mg/dL (ref 61–437)
IgG (Immunoglobin G), Serum: 866 mg/dL (ref 603–1613)
IgM (Immunoglobulin M), Srm: 80 mg/dL (ref 15–143)
Total Protein ELP: 6.2 g/dL (ref 6.0–8.5)

## 2022-03-26 ENCOUNTER — Other Ambulatory Visit: Payer: Self-pay | Admitting: Neurology

## 2022-04-04 DIAGNOSIS — R3914 Feeling of incomplete bladder emptying: Secondary | ICD-10-CM | POA: Diagnosis not present

## 2022-04-04 DIAGNOSIS — N139 Obstructive and reflux uropathy, unspecified: Secondary | ICD-10-CM | POA: Diagnosis not present

## 2022-04-04 DIAGNOSIS — N312 Flaccid neuropathic bladder, not elsewhere classified: Secondary | ICD-10-CM | POA: Diagnosis not present

## 2022-04-05 ENCOUNTER — Other Ambulatory Visit: Payer: Self-pay | Admitting: Neurology

## 2022-04-11 ENCOUNTER — Other Ambulatory Visit: Payer: Self-pay

## 2022-04-11 MED ORDER — GABAPENTIN 300 MG PO CAPS
ORAL_CAPSULE | ORAL | 0 refills | Status: DC
Start: 2022-04-11 — End: 2022-07-11

## 2022-04-11 NOTE — Telephone Encounter (Signed)
Ok to fill until f/u with Dr. Posey Pronto, thanks ?

## 2022-04-18 DIAGNOSIS — N312 Flaccid neuropathic bladder, not elsewhere classified: Secondary | ICD-10-CM | POA: Diagnosis not present

## 2022-04-18 DIAGNOSIS — N3020 Other chronic cystitis without hematuria: Secondary | ICD-10-CM | POA: Diagnosis not present

## 2022-04-26 ENCOUNTER — Other Ambulatory Visit: Payer: Self-pay | Admitting: Internal Medicine

## 2022-04-26 DIAGNOSIS — I428 Other cardiomyopathies: Secondary | ICD-10-CM

## 2022-04-26 DIAGNOSIS — I2510 Atherosclerotic heart disease of native coronary artery without angina pectoris: Secondary | ICD-10-CM

## 2022-05-05 DIAGNOSIS — N312 Flaccid neuropathic bladder, not elsewhere classified: Secondary | ICD-10-CM | POA: Diagnosis not present

## 2022-05-05 DIAGNOSIS — R3914 Feeling of incomplete bladder emptying: Secondary | ICD-10-CM | POA: Diagnosis not present

## 2022-05-05 DIAGNOSIS — N139 Obstructive and reflux uropathy, unspecified: Secondary | ICD-10-CM | POA: Diagnosis not present

## 2022-05-11 DIAGNOSIS — H524 Presbyopia: Secondary | ICD-10-CM | POA: Diagnosis not present

## 2022-05-11 DIAGNOSIS — Z961 Presence of intraocular lens: Secondary | ICD-10-CM | POA: Diagnosis not present

## 2022-05-11 DIAGNOSIS — H52203 Unspecified astigmatism, bilateral: Secondary | ICD-10-CM | POA: Diagnosis not present

## 2022-05-11 DIAGNOSIS — H04123 Dry eye syndrome of bilateral lacrimal glands: Secondary | ICD-10-CM | POA: Diagnosis not present

## 2022-06-04 DIAGNOSIS — N139 Obstructive and reflux uropathy, unspecified: Secondary | ICD-10-CM | POA: Diagnosis not present

## 2022-06-04 DIAGNOSIS — R3914 Feeling of incomplete bladder emptying: Secondary | ICD-10-CM | POA: Diagnosis not present

## 2022-06-04 DIAGNOSIS — N312 Flaccid neuropathic bladder, not elsewhere classified: Secondary | ICD-10-CM | POA: Diagnosis not present

## 2022-06-07 DIAGNOSIS — N139 Obstructive and reflux uropathy, unspecified: Secondary | ICD-10-CM | POA: Diagnosis not present

## 2022-06-07 DIAGNOSIS — R3914 Feeling of incomplete bladder emptying: Secondary | ICD-10-CM | POA: Diagnosis not present

## 2022-06-07 DIAGNOSIS — N312 Flaccid neuropathic bladder, not elsewhere classified: Secondary | ICD-10-CM | POA: Diagnosis not present

## 2022-06-22 ENCOUNTER — Encounter: Payer: PPO | Admitting: Internal Medicine

## 2022-06-22 ENCOUNTER — Non-Acute Institutional Stay: Payer: PPO | Admitting: Internal Medicine

## 2022-06-22 ENCOUNTER — Encounter: Payer: Self-pay | Admitting: Internal Medicine

## 2022-06-22 VITALS — BP 136/86 | HR 68 | Temp 97.5°F | Resp 17 | Ht 70.0 in | Wt 188.4 lb

## 2022-06-22 DIAGNOSIS — M1990 Unspecified osteoarthritis, unspecified site: Secondary | ICD-10-CM | POA: Diagnosis not present

## 2022-06-22 DIAGNOSIS — N312 Flaccid neuropathic bladder, not elsewhere classified: Secondary | ICD-10-CM

## 2022-06-22 DIAGNOSIS — D696 Thrombocytopenia, unspecified: Secondary | ICD-10-CM

## 2022-06-22 DIAGNOSIS — G609 Hereditary and idiopathic neuropathy, unspecified: Secondary | ICD-10-CM

## 2022-06-22 DIAGNOSIS — E538 Deficiency of other specified B group vitamins: Secondary | ICD-10-CM

## 2022-06-22 DIAGNOSIS — E039 Hypothyroidism, unspecified: Secondary | ICD-10-CM | POA: Diagnosis not present

## 2022-06-22 DIAGNOSIS — F5101 Primary insomnia: Secondary | ICD-10-CM | POA: Diagnosis not present

## 2022-06-22 DIAGNOSIS — I428 Other cardiomyopathies: Secondary | ICD-10-CM | POA: Diagnosis not present

## 2022-06-22 NOTE — Progress Notes (Addendum)
Location:  [place] of Service:  Clinic (12)  Provider:   Code Status:  Goals of Care:     06/22/2022    8:16 AM  Advanced Directives  Does Patient Have a Medical Advance Directive? Yes  Type of Paramedic of [place];Living will  Does patient want to make changes to medical advance directive? No - Patient declined  Copy of [place] in Chart? No - copy requested      Significant value     Chief Complaint  Patient presents with   Medical Management of Chronic Issues    Patient presents today for a 4 month follow up.    Quality Metric Gaps    Discuss the need for Colonoscopy and additional Covid booster, or post pone if patient refuses.     HPI: Patient is a 88 y.o. male seen today for medical management of chronic diseases.    History of peripheral neuropathy sees Dr. Posey Pronto.  On gabapentin.  Just takes it at night Worsening gait Needs [place] now for long distance walking No Pain  Has a history of nonobstructive CAD, nonischemic cardiomyopathy, left bundle branch block and HLD Last echo showed EF of 55 to 60% with mild MR Follows with Cardiology No SOB or chest pain  History of flaccid neurogenic bladder  catheterize at least once or twice in a day Recurrent UTIs follows with urology Continues to be issue  History of insomnia with restless leg.  Is on Valium and Tylenol PM Pancreatic Cyst CT imaging Stable Recommended No Follow up needed Thrombocytopenia Saw Hematology No New recommendation Will Follow with them Does have few Bruises in his Arms  Right Wrist pain New issue Says it happens sporadically No Falls or swelling  Constipation with Diarrhea Especially when he takes Miralax  Very active socially   Past Medical History:  Diagnosis Date   Alcoholic peripheral neuropathy [facility])    neurologist-  dr patel--- feet numbness and sensory ataxia   Arthritis    B12  deficiency    BPH (benign prostatic hyperplasia)    CAD (coronary artery disease) cardiolgoist-  dr Harrell Gave end (previouly dr dalton [name])   a. Myoview 11/13:  EF 38%, inf and IS defect c/w scar but no ischemia:  b. Cardiac CT 11/13:  Ca score 318 Agatson units, pLAD and dCFX plaque;   c. LHC 11/07/12:  pLAD 30%, oD1 40%, oCFX 30%, dCFX 40-50%, mRCA 40%, EF 50% (frequent PVCs and short run of NSVT with injection/    per last echo 01/ 2017  ef 55-60%   Chronic constipation    Diverticulosis of colon    Esophageal reflux    External hemorrhoids    First degree heart block    Hiatal hernia    History of adenomatous polyp of colon    History of esophageal stricture 08/11/2015   s/p  dilatation   History of lower GI bleeding 03/01/2017   s/p  flexiable simoidscopy w/ clipping rectum ulcer   Hypogonadism male    Hypothyroidism    Interstitial cystitis    Irritable bowel syndrome    LBBB (left bundle branch block)    NICM (nonischemic cardiomyopathy) [facility]) cardiologist-  dr Harrell Gave end (previously dr dalton [name])--  per last echo 01/ 2017  ef 55-60%   ? 2/2 LBBB => a. echo 11/13: diff HK, worse in septum and apex, mod LVE, mild LVH, EF 40%, mild AI, mild MR, mod LAE  Pre-diabetes    Self-catheterizes urinary bladder    bid to tid    Wears glasses    Wears hearing aid in both ears     Past Surgical History:  Procedure Laterality Date   CARDIAC CATHETERIZATION  11-07-2012    dr [name]   nonobstructive CAD (pLAD 30%, ostial D1 40%, ostial LCx 30%, dLCx diffuse 40-50%, mRCA 40%) ;  LVSF 50% but diffiult due to PVCs and short run VT with injection;  cardiomyopathy mostly likely a LBBB cardiomyopathy   CARDIOVASCULAR STRESS TEST  10-16-2012   dr [name]   Low risk adenosine nuclear study (no exercise) w/ a fixed inferior and inferoseptal defect without ischemia (question as whether this abnormality due to LBBB cardiomyopathy or due to scar)/  LV ef 38%,  LV wall motion decreased of  the septum and entire apex   CATARACT EXTRACTION W/ INTRAOCULAR LENS  IMPLANT, BILATERAL  2012  approx.   COLONOSCOPY  2016   Dr. Henrene Pastor, Per new patient form   CYSTO/  HYDRODISTENTION/  BLADDER BIOPSY  04-20-2005    dr Lawerance Bach  [facility]   ETHMOIDECTOMY Right 12/12/2016   Procedure: RIGHT ENDOSCOPIC ETHMOIDECTOMY;  Surgeon: [name], MD;  Location: [place];  Service: ENT;  Laterality: Right;   FLEXIBLE SIGMOIDOSCOPY N/A 03/02/2017   Procedure: FLEXIBLE SIGMOIDOSCOPY;  Surgeon: Ladene Artist, MD;  [facility];  Service: Endoscopy;  Laterality: N/A;   LUMBAR LAMINECTOMY  1952   MAXILLARY ANTROSTOMY Right 12/12/2016   Procedure: RIGHT ENDOSCOPIC MAXILLARY ANTROSTOMY;  Surgeon: [name], MD;  Location: [place];  Service: ENT;  Laterality: Right;   SINUS ENDO W/FUSION Right 12/12/2016   Procedure: ENDOSCOPIC SINUS SURGERY WITH NAVIGATION;  Surgeon: [name], MD;  Location: [place];  Service: ENT;  Laterality: Right;   SPHENOIDECTOMY Right 12/12/2016   Procedure: RIGHT ENDOSCOPIC SPHENOIDECTOMY;  Surgeon: [name], MD;  Location: [place];  Service: ENT;  Laterality: Right;   TONSILLECTOMY AND ADENOIDECTOMY  child   TOTAL KNEE ARTHROPLASTY Left 10/14/2014   Procedure: LEFT TOTAL KNEE ARTHROPLASTY;  Surgeon: [name], MD;  [facility];  Service: Orthopedics;  Laterality: Left;   TOTAL KNEE ARTHROPLASTY Right 1990s   TRANSTHORACIC ECHOCARDIOGRAM  12-24-2015   dr [name]   ef 55-60%,  grade 1 diastolic dysfunction/  trivial AR and TR  mild MR and PR/  moderate LAE   TRANSURETHRAL RESECTION OF PROSTATE  06-11-2010   dr Gaynelle Arabian  [facility]   w/  GYRUS   TRANSURETHRAL RESECTION OF PROSTATE N/A 12/06/2017   Procedure: TRANSURETHRAL RESECTION OF THE PROSTATE (TURP)/ BIPOLAR;  Surgeon: Ceasar Mons, MD;  [facility];  Service: Urology;  Laterality: N/A;   TRANSURETHRAL RESECTION OF  PROSTATE N/A 04/11/2018   Procedure: TRANSURETHRAL RESECTION OF THE PROSTATE (TURP);  Surgeon: Ceasar Mons, MD;  [facility];  Service: Urology;  Laterality: N/A;   UPPER GASTROINTESTINAL ENDOSCOPY      Allergies  Allergen Reactions   Cyclobenzaprine Other (See Comments)    Lost balance   Lisinopril Other (See Comments)    Affected ability to urinate   Relafen [Nabumetone] Other (See Comments)    Problems urinating afterwards    Statins Other (See Comments)    "my peeing"    Outpatient Encounter Medications as of 06/22/2022  Medication Sig   aspirin EC 81 MG tablet Take 81 mg by mouth daily.   AZO-CRANBERRY PO Take  1 tablet by mouth daily.   carvedilol (COREG) 6.25 MG tablet TAKE 1 TABLET BY MOUTH TWICE A DAY   Cyanocobalamin (VITAMIN B12) 1000 MCG TBCR Take 1,000 mcg by mouth.   diazepam (VALIUM) 5 MG tablet Take 2.5 mg by mouth 2 (two) times daily.   diphenhydramine-acetaminophen (TYLENOL PM) 25-500 MG TABS tablet Take 1 tablet by mouth at bedtime.    gabapentin (NEURONTIN) 300 MG capsule Take 1 tablet 300 mg once a day.   levothyroxine (SYNTHROID) 25 MCG tablet Take 25 mcg by mouth every evening.    omeprazole (PRILOSEC) 20 MG capsule TAKE 1 CAPSULE (20 MG TOTAL) BY MOUTH DAILY. OFFICE VISIT FOR FURTHER REFILLS   polyethylene glycol (MIRALAX / GLYCOLAX) packet Take 17 g by mouth daily as needed for mild constipation.   Probiotic Product (PROBIOTIC PO) Take 1 capsule by mouth daily.   rosuvastatin (CRESTOR) 5 MG tablet TAKE 1 TABLET BY MOUTH DAILY. PLEASE KEEP UPCOMING APPT IN MARCH 2023 BEFORE MORE REFILLS   trimethoprim (TRIMPEX) 100 MG tablet Take 100 mg by mouth at bedtime.   [DISCONTINUED] acetaminophen (TYLENOL) 325 MG tablet Take 2 tablets (650 mg total) by mouth every 6 (six) hours as needed for mild pain.   Facility-Administered Encounter Medications as of 06/22/2022  Medication   0.9 %  sodium chloride infusion    Review of Systems:  Review of  Systems  Constitutional:  Negative for activity change, appetite change and unexpected weight change.  HENT: Negative.    Respiratory:  Negative for cough and shortness of breath.   Cardiovascular:  Negative for leg swelling.  Gastrointestinal:  Positive for constipation and diarrhea.  Genitourinary:  Negative for frequency.  Musculoskeletal:  Positive for arthralgias and gait problem. Negative for myalgias.  Skin: Negative.  Negative for rash.  Neurological:  Negative for dizziness and weakness.  Psychiatric/Behavioral:  Negative for confusion and sleep disturbance.   All other systems reviewed and are negative.   Health Maintenance  Topic Date Due   COLONOSCOPY (Pts 45-4yr Insurance coverage will need to be confirmed)  02/21/2019   COVID-19 Vaccine (5 - Booster) 12/15/2020   INFLUENZA VACCINE  07/05/2022   TETANUS/TDAP  12/06/2027   Pneumonia Vaccine 88 Years old  Completed   Zoster Vaccines- Shingrix  Completed   HPV VACCINES  Aged Out    Physical Exam: Vitals:   06/22/22 0812  BP: 136/86  Pulse: 68  Resp: 17  Temp: (!) 97.5 F (36.4 C)  TempSrc: Temporal  SpO2: 93%  Weight: 188 lb 6.4 oz (85.5 kg)  Height: '5\' 10"'$  (1.778 m)   Body mass index is 27.03 kg/m. Physical Exam Vitals reviewed.  Constitutional:      Appearance: Normal appearance.  HENT:     Head: Normocephalic.     Nose: Nose normal.     Mouth/Throat:     Mouth: Mucous membranes are moist.     Pharynx: Oropharynx is clear.  Eyes:     Pupils: Pupils are equal, round, and reactive to light.  Cardiovascular:     Rate and Rhythm: Normal rate and regular rhythm.     Pulses: Normal pulses.     Heart sounds: No murmur heard. Pulmonary:     Effort: Pulmonary effort is normal. No respiratory distress.     Breath sounds: Normal breath sounds. No rales.  Abdominal:     General: Abdomen is flat. Bowel sounds are normal.     Palpations: Abdomen is soft.     Comments: Small  Abdominal Hernia   Musculoskeletal:        General: No swelling.     Cervical back: Neck supple.  Skin:    General: Skin is warm.     Findings: Bruising present.  Neurological:     General: No focal deficit present.     Mental Status: He is alert and oriented to person, place, and time.     Comments: Gait is unstable with his cane  Psychiatric:        Mood and Affect: Mood normal.        Thought Content: Thought content normal.    Labs reviewed: Basic Metabolic Panel: Recent Labs    02/08/22 0000  NA 141  K 4.2  CL 105  CO2 28*  BUN 10  CREATININE 0.7  CALCIUM 9.3  TSH 8.96*   Liver Function Tests: Recent Labs    02/08/22 0000  ALBUMIN 4.1   No results for input(s): "LIPASE", "AMYLASE" in the last 8760 hours. No results for input(s): "AMMONIA" in the last 8760 hours. CBC: Recent Labs    07/28/21 1812 02/08/22 0000 03/07/22 1106  WBC 7.7 3.7 6.0  NEUTROABS 4.8  --  3.1  HGB 13.2 12.4* 12.6*  HCT 41.1 39* 40.3  MCV 95.8  --  95.0  PLT 124* 85* 134*   Lipid Panel: Recent Labs    02/08/22 0000  CHOL 136  HDL 59  LDLCALC 63  TRIG 70   Lab Results  Component Value Date   HGBA1C 5.2 04/10/2018    Procedures since last visit: No results found.  Assessment/Plan 1. Thrombocytopenia [facility]) Per hematology Monitoring for now Causes Alcohol, Myelodysplasia CT negative for Splenomegaly 2. B12 deficiency Now on Supplement Repeat levels next Blood work  3. Hypothyroidism, unspecified type Repeat TSH  4. Peripheral neuropathy, idiopathic Getting worse Refuses therapy for now [facility] with his cane and Walker Unstable Gait  5. Wrist Pain Exam was normal today Will Check Crp to rule out inflammatory Arthritis He will let the nurse in facility know when pain or swelling comes back Also Check Uric acid 6. NICM (nonischemic cardiomyopathy) ([place]) Follows with cardiology Normal EF Aspirin Coreg and Statin  7. Flaccid neuropathic bladder, not elsewhere classified Self  Catheterize On Trimethoprim  8. Primary insomnia Takes valium PRN  9 Alcohol drinking problem Patient says he is going to try quitting Alcohol 10 Mixed hyperlipidemia Doing well on Statin 11 Gastritis Prilosec 12 Pancreatic cyst No more follow up needed 13 Constipation Alternating with Diarrhea Told to cut back amount of Miralax to half cup and add Stool softener everyday  Labs/tests ordered:  CBC,CMP,TSH,B 12 and CRP Uric acid Next appt:  Visit date not found

## 2022-06-22 NOTE — Patient Instructions (Signed)
Try Stool softener every Night Consider Exercise for your Legs Let Nurse know if you feel any pain in your Wrist

## 2022-06-28 DIAGNOSIS — N139 Obstructive and reflux uropathy, unspecified: Secondary | ICD-10-CM | POA: Diagnosis not present

## 2022-06-28 DIAGNOSIS — R3914 Feeling of incomplete bladder emptying: Secondary | ICD-10-CM | POA: Diagnosis not present

## 2022-06-28 DIAGNOSIS — N312 Flaccid neuropathic bladder, not elsewhere classified: Secondary | ICD-10-CM | POA: Diagnosis not present

## 2022-07-10 ENCOUNTER — Other Ambulatory Visit: Payer: Self-pay | Admitting: Neurology

## 2022-07-25 DIAGNOSIS — N3020 Other chronic cystitis without hematuria: Secondary | ICD-10-CM | POA: Diagnosis not present

## 2022-07-25 DIAGNOSIS — N312 Flaccid neuropathic bladder, not elsewhere classified: Secondary | ICD-10-CM | POA: Diagnosis not present

## 2022-07-26 DIAGNOSIS — E538 Deficiency of other specified B group vitamins: Secondary | ICD-10-CM | POA: Diagnosis not present

## 2022-07-26 DIAGNOSIS — I2510 Atherosclerotic heart disease of native coronary artery without angina pectoris: Secondary | ICD-10-CM | POA: Diagnosis not present

## 2022-07-26 DIAGNOSIS — E039 Hypothyroidism, unspecified: Secondary | ICD-10-CM | POA: Diagnosis not present

## 2022-07-26 DIAGNOSIS — D696 Thrombocytopenia, unspecified: Secondary | ICD-10-CM | POA: Diagnosis not present

## 2022-07-26 LAB — TSH: TSH: 6.85 — AB (ref 0.41–5.90)

## 2022-07-26 LAB — BASIC METABOLIC PANEL
BUN: 12 (ref 4–21)
CO2: 26 — AB (ref 13–22)
Chloride: 104 (ref 99–108)
Creatinine: 0.7 (ref 0.6–1.3)
Glucose: 83
Potassium: 4.2 mEq/L (ref 3.5–5.1)
Sodium: 140 (ref 137–147)

## 2022-07-26 LAB — CBC AND DIFFERENTIAL
HCT: 39 — AB (ref 41–53)
Hemoglobin: 12.9 — AB (ref 13.5–17.5)
Platelets: 99 10*3/uL — AB (ref 150–400)
WBC: 4.2

## 2022-07-26 LAB — COMPREHENSIVE METABOLIC PANEL
Albumin: 4.1 (ref 3.5–5.0)
Calcium: 9.3 (ref 8.7–10.7)
Globulin: 2.2

## 2022-07-26 LAB — HEPATIC FUNCTION PANEL
ALT: 15 U/L (ref 10–40)
AST: 24 (ref 14–40)
Alkaline Phosphatase: 77 (ref 25–125)
Bilirubin, Total: 0.3

## 2022-07-26 LAB — CBC: RBC: 4.1 (ref 3.87–5.11)

## 2022-07-26 LAB — VITAMIN B12: Vitamin B-12: 906

## 2022-08-08 DIAGNOSIS — R3914 Feeling of incomplete bladder emptying: Secondary | ICD-10-CM | POA: Diagnosis not present

## 2022-08-08 DIAGNOSIS — N312 Flaccid neuropathic bladder, not elsewhere classified: Secondary | ICD-10-CM | POA: Diagnosis not present

## 2022-08-08 DIAGNOSIS — N139 Obstructive and reflux uropathy, unspecified: Secondary | ICD-10-CM | POA: Diagnosis not present

## 2022-08-15 ENCOUNTER — Encounter: Payer: PPO | Admitting: Adult Health

## 2022-08-29 ENCOUNTER — Ambulatory Visit: Payer: PPO | Admitting: Neurology

## 2022-08-29 ENCOUNTER — Encounter: Payer: Self-pay | Admitting: Neurology

## 2022-08-29 VITALS — BP 117/67 | HR 74 | Ht 70.0 in | Wt 187.0 lb

## 2022-08-29 DIAGNOSIS — G621 Alcoholic polyneuropathy: Secondary | ICD-10-CM

## 2022-08-29 MED ORDER — GABAPENTIN 300 MG PO CAPS
ORAL_CAPSULE | ORAL | 3 refills | Status: AC
Start: 2022-08-29 — End: ?

## 2022-08-29 NOTE — Progress Notes (Signed)
Follow-up Visit   Date: 08/29/22    David Gutierrez MRN: [id number] DOB: [birth year]   Interim History: David Gutierrez is a 88 y.o. right-handed Caucasian male with hypertension, CAD, GERD, hyperlipidemia, interstitial cystitis, s/p lumbar surgery (1952) and hypothyroidism returning to the clinic for follow-up of neuropathy, alcohol-induced.  The patient was accompanied to the clinic by self.  History of present illness: A few years ago, he began noticing imbalance when climbing hills while playing golf.  He gave up playing golf late 2016 because of difficulty with balance.  He walk independently, but when walking his dog he takes a hiking stick which helps. He stays very active and had a trainer as well as classes that he attends for balance.   Several years ago, he also noticed that he has numbness involving the feet. He drinks about 4-5oz vodka and occasionally wine for the past 20 years.  No history of diabetes. He was diagnosed with alcohol-induced neuropathy.  He quit drinking alcohol for 1 year and noticed mild improvement in his feet paresthesias, but not enough for him to want to completely abstain from alcohol.  He has been using a foot massager with electrical stimulator, which provides some relief.  He was found to be vitamin B12 and B1 deficient and since taking supplements, his energy level has improved.   UPDATE 08/24/2020:  Over the past year, he has noticed mild progression of numbness in the legs and imbalance. He is unable to walk as far and tires easily. He uses a walker/cane as needed.  He suffered one fall while picking flowers outside and bruised his right foot.  He continues to drink (2) 2oz alcoholic beverages nightly.  No new complaints.   UPDATE 08/27/2021:   His neuropathy has been stable.  He does have some tingling in the feet which can be bothersome at night.  Numbness remains in the lower legs and feet. He uses a cane and a walker for long distances. He was  diagnosed with cellulitis of the right foot last month and has been having ongoing issues with this.  No falls.  He continues to drive and is very cautious.  He tells me that he can control the pedals.  He is requesting renewal for handicap placard.  UPDATE 08/29/2022:  He is here for 1 year visit.   He was started on gabapentin at the last visit.  He takes gabapentin '300mg'$  at bedtime and reports no significant tingling.  He continues to have significant numbness in the feet.  No problems with driving.  He denies any interval falls, hospitalizations, or illnesses.   Medications:  Current Outpatient Medications on File Prior to Visit  Medication Sig Dispense Refill   aspirin EC 81 MG tablet Take 81 mg by mouth daily.     AZO-CRANBERRY PO Take 1 tablet by mouth daily.     carvedilol (COREG) 6.25 MG tablet TAKE 1 TABLET BY MOUTH TWICE A DAY 180 tablet 3   Cyanocobalamin (VITAMIN B12) 1000 MCG TBCR Take 1,000 mcg by mouth.     diazepam (VALIUM) 5 MG tablet Take 2.5 mg by mouth 2 (two) times daily. 1/2 tablet every 2-3 days     diphenhydramine-acetaminophen (TYLENOL PM) 25-500 MG TABS tablet Take 1 tablet by mouth at bedtime.      levothyroxine (SYNTHROID) 25 MCG tablet Take 25 mcg by mouth every evening.      omeprazole (PRILOSEC) 20 MG capsule TAKE 1 CAPSULE (20 MG TOTAL) BY MOUTH  DAILY. OFFICE VISIT FOR FURTHER REFILLS 90 capsule 1   polyethylene glycol (MIRALAX / GLYCOLAX) packet Take 17 g by mouth daily as needed for mild constipation.     Probiotic Product (PROBIOTIC PO) Take 1 capsule by mouth daily.     rosuvastatin (CRESTOR) 5 MG tablet TAKE 1 TABLET BY MOUTH DAILY. PLEASE KEEP UPCOMING APPT IN MARCH 2023 BEFORE MORE REFILLS 90 tablet 3   trimethoprim (TRIMPEX) 100 MG tablet Take 100 mg by mouth at bedtime.     Current Facility-Administered Medications on File Prior to Visit  Medication Dose Route Frequency Provider Last Rate Last Admin   0.9 %  sodium chloride infusion  500 mL Intravenous  Once Irene Shipper, MD        Allergies:  Allergies  Allergen Reactions   Cyclobenzaprine Other (See Comments)    Lost balance   Lisinopril Other (See Comments)    Affected ability to urinate   Relafen [Nabumetone] Other (See Comments)    Problems urinating afterwards    Statins Other (See Comments)    "my peeing"    Vital Signs:  BP 117/67   Pulse 74   Ht '5\' 10"'$  (1.778 m)   Wt 187 lb (84.8 kg)   SpO2 97%   BMI 26.83 kg/m   Neurological Exam: MENTAL STATUS including orientation to time, place, person, recent and remote memory, attention span and concentration, language, and fund of knowledge is normal.  Speech is not dysarthric.  CRANIAL NERVES:  Pupils round and reactive to light.  Extraocular muscles intact.  Face is symmetric.  MOTOR:  Motor strength is 5/5 throughout including distally in the feet.  No pronator drift.  Tone is normal.    MSRs:  Right                                                                 Left brachioradialis 2+  brachioradialis 2+  biceps 2+  biceps 2+  triceps 2+  triceps 2+  patellar 1+  patellar 1+  ankle jerk 0  ankle jerk 0   SENSORY:  Vibration and light touch reduced distal to ankles bilaterally.  There is mild sway with Rhomberg testing.  COORDINATION/GAIT:   Gait wide-based, assisted with cane,  and somewhat unsteady.  LABS: Labs 03/02/2016:  TSH 3.02, Na 142, K 4.7, Glucose 94, BUN 14, Cr 0.7, LFTs normal Labs 05/06/2016:  Vitamin B12 242, folate 12.2, copper 97, SPEP with IFE no M protein  Lab Results  Component Value Date   VITAMINB12 906 07/26/2022    IMPRESSION: Peripheral neuropathy, alcohol-induced, mild progression.  Symptoms manifesting with numbness, tingling, and ataxia  - Continue gabapentin '300mg'$  at bedtime  - Continue home balance exercises  - He compliant with using a walker  Return to clinic in 1 year  Thank you for allowing me to participate in patient's care.  If I can answer any additional questions, I  would be pleased to do so.    Sincerely,    Vona Whiters K. Posey Pronto, DO

## 2022-08-29 NOTE — Patient Instructions (Signed)
It was great to see you today!  Continue gabapentin '300mg'$  at bedtime  I will see you back in 1 year

## 2022-09-07 ENCOUNTER — Other Ambulatory Visit: Payer: Self-pay

## 2022-09-07 ENCOUNTER — Inpatient Hospital Stay: Payer: PPO | Attending: Oncology

## 2022-09-07 ENCOUNTER — Telehealth: Payer: Self-pay

## 2022-09-07 ENCOUNTER — Inpatient Hospital Stay: Payer: PPO | Admitting: Oncology

## 2022-09-07 VITALS — BP 124/76 | HR 67 | Temp 98.2°F | Resp 20 | Ht 70.0 in | Wt 187.0 lb

## 2022-09-07 DIAGNOSIS — D696 Thrombocytopenia, unspecified: Secondary | ICD-10-CM | POA: Diagnosis not present

## 2022-09-07 DIAGNOSIS — E538 Deficiency of other specified B group vitamins: Secondary | ICD-10-CM | POA: Diagnosis not present

## 2022-09-07 LAB — CBC WITH DIFFERENTIAL (CANCER CENTER ONLY)
Abs Immature Granulocytes: 0.03 10*3/uL (ref 0.00–0.07)
Basophils Absolute: 0 10*3/uL (ref 0.0–0.1)
Basophils Relative: 1 %
Eosinophils Absolute: 0.1 10*3/uL (ref 0.0–0.5)
Eosinophils Relative: 1 %
HCT: 38.7 % — ABNORMAL LOW (ref 39.0–52.0)
Hemoglobin: 12.5 g/dL — ABNORMAL LOW (ref 13.0–17.0)
Immature Granulocytes: 1 %
Lymphocytes Relative: 32 %
Lymphs Abs: 1.8 10*3/uL (ref 0.7–4.0)
MCH: 30.7 pg (ref 26.0–34.0)
MCHC: 32.3 g/dL (ref 30.0–36.0)
MCV: 95.1 fL (ref 80.0–100.0)
Monocytes Absolute: 0.6 10*3/uL (ref 0.1–1.0)
Monocytes Relative: 10 %
Neutro Abs: 3.2 10*3/uL (ref 1.7–7.7)
Neutrophils Relative %: 55 %
Platelet Count: 107 10*3/uL — ABNORMAL LOW (ref 150–400)
RBC: 4.07 MIL/uL — ABNORMAL LOW (ref 4.22–5.81)
RDW: 18.7 % — ABNORMAL HIGH (ref 11.5–15.5)
WBC Count: 5.7 10*3/uL (ref 4.0–10.5)
nRBC: 0 % (ref 0.0–0.2)

## 2022-09-07 NOTE — Telephone Encounter (Signed)
Per Dr. Benay Spice referral to the orthopedics at [facility]

## 2022-09-07 NOTE — Progress Notes (Signed)
  [place] OFFICE PROGRESS NOTE   Diagnosis: Thrombocytopenia  INTERVAL HISTORY:   David Gutierrez returns as scheduled.  He feels well other than discomfort at the left wrist.  He reports intermittent swelling in pain at the left wrist for several years.  No trauma. He continues to drink approximately 4 ounces of liquor per day.  No bleeding.  No fever or recent infection.  He has neuropathy in the feet. We saw him for evaluation of thrombocytopenia in April.  A myeloma panel revealed no serum M spike and normal immunoglobin levels.  There was a faint IgA band and light chain specificity could not be verified.  The serum free kappa light chains were mildly elevated at 21.7. Objective:  Vital signs in last 24 hours:  Blood pressure 124/76, pulse 67, temperature 98.2 F (36.8 C), temperature source Oral, resp. rate 20, height '5\' 10"'$  (1.778 m), weight 187 lb (84.8 kg), SpO2 100 %.    Lymphatics: No cervical, supraclavicular, axillary, or inguinal nodes Resp: Lungs with scattered end inspiratory rhonchi at the posterior chest bilaterally, no respiratory distress Cardio: Regular rate and rhythm, distant heart sounds GI: No hepatosplenomegaly Vascular: No leg edema Musculoskeletal: Swelling at the distal left forearm and wrist.  No erythema.  No palpable cord.   Lab Results:  Lab Results  Component Value Date   WBC 5.7 09/07/2022   HGB 12.5 (L) 09/07/2022   HCT 38.7 (L) 09/07/2022   MCV 95.1 09/07/2022   PLT 107 (L) 09/07/2022   NEUTROABS 3.2 09/07/2022    CMP  Lab Results  Component Value Date   NA 140 07/26/2022   K 4.2 07/26/2022   CL 104 07/26/2022   CO2 26 (A) 07/26/2022   GLUCOSE 91 04/10/2018   BUN 12 07/26/2022   CREATININE 0.7 07/26/2022   CALCIUM 9.3 07/26/2022   PROT 5.4 (L) 03/03/2017   ALBUMIN 4.1 07/26/2022   AST 24 07/26/2022   ALT 15 07/26/2022   ALKPHOS 77 07/26/2022   BILITOT 1.0 03/03/2017   GFRNONAA >60 04/10/2018   GFRAA >60  04/10/2018     Medications: I have reviewed the patient's current medications.   Assessment/Plan: Mild thrombocytopenia and anemia BPH/neurogenic bladder, self catheterizes Hypothyroid CAD Nonischemic cardiomyopathy Chronic left bundle branch block Peripheral neuropathy Esophageal stricture/dysphagia followed by Dr. 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 Terry has stable mild thrombocytopenia.  The hemoglobin is at the low end of normal range.  I suspect the thrombocytopenia is related to alcohol use, unrecognized chronic liver disease, or early myelodysplasia.  We discussed the faint abnormal IgA band on the serum and fixation it from April.  This is likely a nonspecific finding.  We will repeat a serum immunofixation when he returns in 4 months.  The etiology of the left wrist pain and swelling is unclear.  We will make an orthopedic referral.  He will call for bleeding.  Betsy Coder, MD  09/07/2022  12:13 PM

## 2022-09-09 ENCOUNTER — Ambulatory Visit (HOSPITAL_BASED_OUTPATIENT_CLINIC_OR_DEPARTMENT_OTHER): Payer: PPO | Admitting: Orthopaedic Surgery

## 2022-09-14 ENCOUNTER — Ambulatory Visit (INDEPENDENT_AMBULATORY_CARE_PROVIDER_SITE_OTHER): Payer: PPO | Admitting: Orthopaedic Surgery

## 2022-09-14 ENCOUNTER — Other Ambulatory Visit: Payer: Self-pay | Admitting: Nurse Practitioner

## 2022-09-14 ENCOUNTER — Ambulatory Visit (INDEPENDENT_AMBULATORY_CARE_PROVIDER_SITE_OTHER): Payer: PPO

## 2022-09-14 DIAGNOSIS — M25532 Pain in left wrist: Secondary | ICD-10-CM

## 2022-09-14 DIAGNOSIS — M19032 Primary osteoarthritis, left wrist: Secondary | ICD-10-CM | POA: Diagnosis not present

## 2022-09-14 DIAGNOSIS — D696 Thrombocytopenia, unspecified: Secondary | ICD-10-CM

## 2022-09-14 NOTE — Progress Notes (Signed)
Chief Complaint: Left wrist pain     History of Present Illness:    David Gutierrez is a 88 y.o. male presents today with 3 to 5 years of ongoing left wrist pain that is worse at the end of the day.  He states that 3 years ago he had to stop playing golf as a result of this.  He states the end of a long day he feels weakness with grip and pain predominantly involving the distal aspect of the wrist.  He has not been taking any anti-inflammatories for this.  He is currently seeing Dr. Ammie Dalton for ongoing management of thrombocytopenia.  This has been chronic in nature.     Surgical History:   None  PMH/PSH/Family History/Social History/Meds/Allergies:    Past Medical History:  Diagnosis Date   Alcoholic peripheral neuropathy [facility])    neurologist-  dr patel--- feet numbness and sensory ataxia   Arthritis    B12 deficiency    BPH (benign prostatic hyperplasia)    CAD (coronary artery disease) cardiolgoist-  dr Harrell Gave end (previouly dr dalton [name])   a. Myoview 11/13:  EF 38%, inf and IS defect c/w scar but no ischemia:  b. Cardiac CT 11/13:  Ca score 318 Agatson units, pLAD and dCFX plaque;   c. LHC 11/07/12:  pLAD 30%, oD1 40%, oCFX 30%, dCFX 40-50%, mRCA 40%, EF 50% (frequent PVCs and short run of NSVT with injection/    per last echo 01/ 2017  ef 55-60%   Chronic constipation    Diverticulosis of colon    Esophageal reflux    External hemorrhoids    First degree heart block    Hiatal hernia    History of adenomatous polyp of colon    History of esophageal stricture 08/11/2015   s/p  dilatation   History of lower GI bleeding 03/01/2017   s/p  flexiable simoidscopy w/ clipping rectum ulcer   Hypogonadism male    Hypothyroidism    Interstitial cystitis    Irritable bowel syndrome    LBBB (left bundle branch block)    NICM (nonischemic cardiomyopathy) [facility]) cardiologist-  dr Harrell Gave end (previously dr dalton [name])--  per last echo 01/  2017  ef 55-60%   ? 2/2 LBBB => a. echo 11/13: diff HK, worse in septum and apex, mod LVE, mild LVH, EF 40%, mild AI, mild MR, mod LAE   Pre-diabetes    Self-catheterizes urinary bladder    bid to tid    Wears glasses    Wears hearing aid in both ears    Past Surgical History:  Procedure Laterality Date   CARDIAC CATHETERIZATION  11-07-2012    dr [name]   nonobstructive CAD (pLAD 30%, ostial D1 40%, ostial LCx 30%, dLCx diffuse 40-50%, mRCA 40%) ;  LVSF 50% but diffiult due to PVCs and short run VT with injection;  cardiomyopathy mostly likely a LBBB cardiomyopathy   CARDIOVASCULAR STRESS TEST  10-16-2012   dr [name]   Low risk adenosine nuclear study (no exercise) w/ a fixed inferior and inferoseptal defect without ischemia (question as whether this abnormality due to LBBB cardiomyopathy or due to scar)/  LV ef 38%,  LV wall motion decreased of the septum and entire apex   CATARACT EXTRACTION W/ INTRAOCULAR LENS  IMPLANT, BILATERAL  2012  approx.   COLONOSCOPY  2016   Dr. Henrene Pastor, Per new patient form   CYSTO/  HYDRODISTENTION/  BLADDER BIOPSY  04-20-2005    dr Lawerance Bach  [facility]   ETHMOIDECTOMY Right 12/12/2016   Procedure: RIGHT ENDOSCOPIC ETHMOIDECTOMY;  Surgeon: [name], MD;  Location: [place];  Service: ENT;  Laterality: Right;   FLEXIBLE SIGMOIDOSCOPY N/A 03/02/2017   Procedure: FLEXIBLE SIGMOIDOSCOPY;  Surgeon: Ladene Artist, MD;  Location: [facility];  Service: Endoscopy;  Laterality: N/A;   LUMBAR LAMINECTOMY  1952   MAXILLARY ANTROSTOMY Right 12/12/2016   Procedure: RIGHT ENDOSCOPIC MAXILLARY ANTROSTOMY;  Surgeon: [name], MD;  Location: [place];  Service: ENT;  Laterality: Right;   SINUS ENDO W/FUSION Right 12/12/2016   Procedure: ENDOSCOPIC SINUS SURGERY WITH NAVIGATION;  Surgeon: [name], MD;  Location: [place];  Service: ENT;  Laterality: Right;   SPHENOIDECTOMY Right 12/12/2016   Procedure: RIGHT ENDOSCOPIC  SPHENOIDECTOMY;  Surgeon: [name], MD;  Location: [place];  Service: ENT;  Laterality: Right;   TONSILLECTOMY AND ADENOIDECTOMY  child   TOTAL KNEE ARTHROPLASTY Left 10/14/2014   Procedure: LEFT TOTAL KNEE ARTHROPLASTY;  Surgeon: [name], MD;  Location: [facility];  Service: Orthopedics;  Laterality: Left;   TOTAL KNEE ARTHROPLASTY Right 1990s   TRANSTHORACIC ECHOCARDIOGRAM  12-24-2015   dr [name]   ef 55-60%,  grade 1 diastolic dysfunction/  trivial AR and TR  mild MR and PR/  moderate LAE   TRANSURETHRAL RESECTION OF PROSTATE  06-11-2010   dr Gaynelle Arabian  [facility]   w/  GYRUS   TRANSURETHRAL RESECTION OF PROSTATE N/A 12/06/2017   Procedure: TRANSURETHRAL RESECTION OF THE PROSTATE (TURP)/ BIPOLAR;  Surgeon: Ceasar Mons, MD;  [facility];  Service: Urology;  Laterality: N/A;   TRANSURETHRAL RESECTION OF PROSTATE N/A 04/11/2018   Procedure: TRANSURETHRAL RESECTION OF THE PROSTATE (TURP);  Surgeon: Ceasar Mons, MD;  Location: [facility];  Service: Urology;  Laterality: N/A;   UPPER GASTROINTESTINAL ENDOSCOPY     Social History   Socioeconomic History   Marital status: Married    Spouse name: Not on file   Number of children: 3   Years of education: Not on file   Highest education level: Not on file  Occupational History   Occupation: retired  Tobacco Use   Smoking status: Former    Packs/day: 2.00    Years: 15.00    Total pack years: 30.00    Types: Cigarettes    Quit date: 11/30/1967    Years since quitting: 54.8   Smokeless tobacco: Never  Vaping Use   Vaping Use: Never used  Substance and Sexual Activity   Alcohol use: Yes    Alcohol/week: 2.0 standard drinks of alcohol    Types: 2 Standard drinks or equivalent per week    Comment: occasional   Drug use: No   Sexual activity: Yes  Other Topics Concern   Not on file  Social History Narrative   Do you drink/eat things with caffeine? Very Little    Marital  status/What year were you married? Married since [place] you live in a house, apartment, assisted living, condo, trailer, etc.? Did not answer   Is it one or more stories? One   How many persons live in your home? 2    Do you have any pets in your home? No   Current or past profession: Academic librarian.  Do you exercise? Very little    Type and how often? Did not answer    Do you have a living will? Yes   Do you have a DNR form? Did not answer   Do you have signed POA/HPOA forms? Did not answer   Social Determinants of Health   Financial Resource Strain: Not on file  Food Insecurity: Not on file  Transportation Needs: Not on file  Physical Activity: Not on file  Stress: Not on file  Social Connections: Not on file   Family History  Problem Relation Age of Onset   Healthy Mother    Healthy Father    Other Sister    Healthy Sister    Thyroid cancer Brother    Heart disease Brother    Diabetes type I Son    Colon cancer Neg Hx    Esophageal cancer Neg Hx    Stomach cancer Neg Hx    Rectal cancer Neg Hx    Allergies  Allergen Reactions   Cyclobenzaprine Other (See Comments)    Lost balance   Lisinopril Other (See Comments)    Affected ability to urinate   Relafen [Nabumetone] Other (See Comments)    Problems urinating afterwards    Statins Other (See Comments)    "my peeing"   Current Outpatient Medications  Medication Sig Dispense Refill   aspirin EC 81 MG tablet Take 81 mg by mouth daily.     AZO-CRANBERRY PO Take 1 tablet by mouth daily.     carvedilol (COREG) 6.25 MG tablet TAKE 1 TABLET BY MOUTH TWICE A DAY 180 tablet 3   Cyanocobalamin (VITAMIN B12) 1000 MCG TBCR Take 1,000 mcg by mouth.     diazepam (VALIUM) 5 MG tablet Take 2.5 mg by mouth 2 (two) times daily. 1/2 tablet every 2-3 days (Patient not taking: Reported on 09/07/2022)     diphenhydramine-acetaminophen (TYLENOL PM) 25-500 MG TABS tablet Take 1 tablet by mouth at bedtime.      gabapentin (NEURONTIN)  300 MG capsule TAKE 1 CAPSULE EVERY DAY 90 capsule 3   levothyroxine (SYNTHROID) 25 MCG tablet Take 25 mcg by mouth every evening.      omeprazole (PRILOSEC) 20 MG capsule TAKE 1 CAPSULE (20 MG TOTAL) BY MOUTH DAILY. OFFICE VISIT FOR FURTHER REFILLS 90 capsule 1   polyethylene glycol (MIRALAX / GLYCOLAX) packet Take 17 g by mouth daily as needed for mild constipation.     Probiotic Product (PROBIOTIC PO) Take 1 capsule by mouth daily.     rosuvastatin (CRESTOR) 5 MG tablet TAKE 1 TABLET BY MOUTH DAILY. PLEASE KEEP UPCOMING APPT IN MARCH 2023 BEFORE MORE REFILLS 90 tablet 3   trimethoprim (TRIMPEX) 100 MG tablet Take 100 mg by mouth at bedtime.     Current Facility-Administered Medications  Medication Dose Route Frequency Provider Last Rate Last Admin   0.9 %  sodium chloride infusion  500 mL Intravenous Once Irene Shipper, MD       DG Wrist Complete Left  Result Date: 09/14/2022 CLINICAL DATA:  Pain EXAM: LEFT WRIST - COMPLETE 3+ VIEW COMPARISON:  None Available. FINDINGS: No recent fracture or dislocation is seen. Chondrocalcinosis is noted in radiocarpal and intercarpal joints. There is 14 mm subcortical cyst in the distal radius. Degenerative changes are noted with bony spurs in first carpometacarpal joint. There are scattered vascular calcifications in soft tissues. IMPRESSION: No recent fracture or dislocation is seen in left wrist. Degenerative changes are noted with subcortical cysts, bony spurs  and chondrocalcinosis in multiple joints as described in the body of the report. Electronically Signed   By: Elmer Picker M.D.   On: 09/14/2022 14:45    Review of Systems:   A ROS was performed including pertinent positives and negatives as documented in the HPI.  Physical Exam :   Constitutional: NAD and appears stated age Neurological: Alert and oriented Psych: Appropriate affect and cooperative There were no vitals taken for this visit.   Comprehensive Musculoskeletal Exam:     Tenderness to palpation about the left wrist.  This is predominantly over the distal radius.  Negative Phalen or Tinel's test over the carpal tunnel.  No tenderness about the ulnar styloid.  Imaging:   Xray (3 views left wrist): There is a large lesion involving the distal radius which appears punched out in nature.  There is some small satellite areas of involvement as well near the DRUJ.  He has significant chondrocalcinosis about the wrist as well.   I personally reviewed and interpreted the radiographs.   Assessment:   88 y.o. male with left wrist pain in the setting of what appears to be a punched-out lesion involving the distal radius.  At this time he does have a plan to have labs drawn for multiple myeloma which point this will help to further ascertain the source of his pain.  He does have fairly significant chondrocalcinosis but at this point I believe that this lesion is more consistent with his diffuse achy type pain.  If his labs are consistent with myeloma I do believe that he may benefit from radiation specifically in this area.  If his work-up is essentially negative for we can consider a steroid injection into that wrist as he does have significant chondrocalcinosis which would be consistent with an arthritic type brace.  I did also discuss that a bone scan could be helpful in elucidating a solitary lesion or myeloma although I do believe that starting with myeloma labs would be a good for start.  Plan :    -Plan for return to clinic as needed for left wrist injection should his work-up not be consistent with myeloma     I personally saw and evaluated the patient, and participated in the management and treatment plan.  Vanetta Mulders, MD Attending Physician, Orthopedic Surgery  This document was dictated using Dragon voice recognition software. A reasonable attempt at proof reading has been made to minimize errors.

## 2022-09-16 ENCOUNTER — Inpatient Hospital Stay: Payer: PPO

## 2022-09-16 DIAGNOSIS — D696 Thrombocytopenia, unspecified: Secondary | ICD-10-CM | POA: Diagnosis not present

## 2022-09-19 LAB — KAPPA/LAMBDA LIGHT CHAINS
Kappa free light chain: 27.4 mg/L — ABNORMAL HIGH (ref 3.3–19.4)
Kappa, lambda light chain ratio: 1.35 (ref 0.26–1.65)
Lambda free light chains: 20.3 mg/L (ref 5.7–26.3)

## 2022-09-21 LAB — MULTIPLE MYELOMA PANEL, SERUM
Albumin SerPl Elph-Mcnc: 3.6 g/dL (ref 2.9–4.4)
Albumin/Glob SerPl: 1.6 (ref 0.7–1.7)
Alpha 1: 0.2 g/dL (ref 0.0–0.4)
Alpha2 Glob SerPl Elph-Mcnc: 0.5 g/dL (ref 0.4–1.0)
B-Globulin SerPl Elph-Mcnc: 0.9 g/dL (ref 0.7–1.3)
Gamma Glob SerPl Elph-Mcnc: 0.8 g/dL (ref 0.4–1.8)
Globulin, Total: 2.4 g/dL (ref 2.2–3.9)
IgA: 290 mg/dL (ref 61–437)
IgG (Immunoglobin G), Serum: 864 mg/dL (ref 603–1613)
IgM (Immunoglobulin M), Srm: 92 mg/dL (ref 15–143)
Total Protein ELP: 6 g/dL (ref 6.0–8.5)

## 2022-09-27 ENCOUNTER — Telehealth: Payer: Self-pay | Admitting: *Deleted

## 2022-09-27 NOTE — Telephone Encounter (Signed)
Notified patient that Dr. Benay Spice was waiting for his myeloma panel to return to discuss results. Based on labs, there is a very low suspicion of myeloma or any other malignancy. Wrist is likely a benign finding. Suggests f/u with ortho if pain worsens.  Patient reports that the pain is gone and wrist no longer swollen. States this pain will come and go. Informed him also that a lytic lesion would not go away on it's own either, so sounds benign.

## 2022-10-12 DIAGNOSIS — R3914 Feeling of incomplete bladder emptying: Secondary | ICD-10-CM | POA: Diagnosis not present

## 2022-10-12 DIAGNOSIS — N312 Flaccid neuropathic bladder, not elsewhere classified: Secondary | ICD-10-CM | POA: Diagnosis not present

## 2022-10-12 DIAGNOSIS — N139 Obstructive and reflux uropathy, unspecified: Secondary | ICD-10-CM | POA: Diagnosis not present

## 2022-10-17 DIAGNOSIS — N3020 Other chronic cystitis without hematuria: Secondary | ICD-10-CM | POA: Diagnosis not present

## 2022-10-17 DIAGNOSIS — N312 Flaccid neuropathic bladder, not elsewhere classified: Secondary | ICD-10-CM | POA: Diagnosis not present

## 2022-10-19 ENCOUNTER — Other Ambulatory Visit: Payer: Self-pay | Admitting: Internal Medicine

## 2022-10-19 NOTE — Telephone Encounter (Signed)
Patient not been seen in office since 2020, last procedure 2021.  Ok to refill?

## 2022-10-23 NOTE — Progress Notes (Unsigned)
Location:  [facility]  POS: Clinic  Provider: Royal Hawthorn, ANP  Code Status: *** Goals of Care:     09/07/2022   11:48 AM  Advanced Directives  Does Patient Have a Medical Advance Directive? Yes  Type of [place] of [place];Living will  Does patient want to make changes to medical advance directive? No - Patient declined  Copy of [place] in Chart? No - copy requested     Chief Complaint  Patient presents with   Medical Management of Chronic Issues    4 month follow up.   Immunizations    Discussed the need for covid vaccine   Quality Metric Gaps    Discussed the need for colonoscopy and AWV    HPI: Patient is a 88 y.o. male seen today for medical management of chronic diseases.    BPH with neurogenic bladder self catheterizes with recurrent UTI on Bactrim Thrombocytopenia followed by Dr Benay Spice suspected to be due to ETOH use vs early myelodysplasia Hypothyroidism Lab Results  Component Value Date   TSH 6.85 (A) 07/26/2022  Peripheral neuropathy seen by neurology in Sept 2023 started on neurontin. Some gait issues.  B12 and B1 deficiency in supplements Pancreatic cyst benign Has a history of nonobstructive CAD, nonischemic cardiomyopathy, left bundle branch block and HLD Last echo showed EF of 55 to 60% with mild MR 2017 Follows with Cardiology No SOB or chest pain GERD/hx of gastritis, hx of rectal ulcer on Prilosec.   Prediabetes Lab Results  Component Value Date   HGBA1C 5.2 04/10/2018     HLD  Lab Results  Component Value Date   LDLCALC 63 02/08/2022    Past Medical History:  Diagnosis Date   Alcoholic peripheral neuropathy [facility])    neurologist-  dr patel--- feet numbness and sensory ataxia   Arthritis    B12 deficiency    BPH (benign prostatic hyperplasia)    CAD (coronary artery disease) cardiolgoist-  dr Harrell Gave end (previouly dr dalton [name])   a. Myoview 11/13:  EF 38%, inf and IS  defect c/w scar but no ischemia:  b. Cardiac CT 11/13:  Ca score 318 Agatson units, pLAD and dCFX plaque;   c. LHC 11/07/12:  pLAD 30%, oD1 40%, oCFX 30%, dCFX 40-50%, mRCA 40%, EF 50% (frequent PVCs and short run of NSVT with injection/    per last echo 01/ 2017  ef 55-60%   Chronic constipation    Diverticulosis of colon    Esophageal reflux    External hemorrhoids    First degree heart block    Hiatal hernia    History of adenomatous polyp of colon    History of esophageal stricture 08/11/2015   s/p  dilatation   History of lower GI bleeding 03/01/2017   s/p  flexiable simoidscopy w/ clipping rectum ulcer   Hypogonadism male    Hypothyroidism    Interstitial cystitis    Irritable bowel syndrome    LBBB (left bundle branch block)    NICM (nonischemic cardiomyopathy) [facility]) cardiologist-  dr Harrell Gave end (previously dr dalton [name])--  per last echo 01/ 2017  ef 55-60%   ? 2/2 LBBB => a. echo 11/13: diff HK, worse in septum and apex, mod LVE, mild LVH, EF 40%, mild AI, mild MR, mod LAE   Pre-diabetes    Self-catheterizes urinary bladder    bid to tid    Wears glasses    Wears hearing aid in both ears  Past Surgical History:  Procedure Laterality Date   CARDIAC CATHETERIZATION  11-07-2012    dr [name]   nonobstructive CAD (pLAD 30%, ostial D1 40%, ostial LCx 30%, dLCx diffuse 40-50%, mRCA 40%) ;  LVSF 50% but diffiult due to PVCs and short run VT with injection;  cardiomyopathy mostly likely a LBBB cardiomyopathy   CARDIOVASCULAR STRESS TEST  10-16-2012   dr [name]   Low risk adenosine nuclear study (no exercise) w/ a fixed inferior and inferoseptal defect without ischemia (question as whether this abnormality due to LBBB cardiomyopathy or due to scar)/  LV ef 38%,  LV wall motion decreased of the septum and entire apex   CATARACT EXTRACTION W/ INTRAOCULAR LENS  IMPLANT, BILATERAL  2012  approx.   COLONOSCOPY  2016   Dr. Henrene Pastor, Per new patient form   CYSTO/  HYDRODISTENTION/   BLADDER BIOPSY  04-20-2005    dr Lawerance Bach  [facility]   ETHMOIDECTOMY Right 12/12/2016   Procedure: RIGHT ENDOSCOPIC ETHMOIDECTOMY;  Surgeon: [name], MD;  Location: [place];  Service: ENT;  Laterality: Right;   FLEXIBLE SIGMOIDOSCOPY N/A 03/02/2017   Procedure: FLEXIBLE SIGMOIDOSCOPY;  Surgeon: Ladene Artist, MD;  Location: [facility];  Service: Endoscopy;  Laterality: N/A;   LUMBAR LAMINECTOMY  1952   MAXILLARY ANTROSTOMY Right 12/12/2016   Procedure: RIGHT ENDOSCOPIC MAXILLARY ANTROSTOMY;  Surgeon: [name], MD;  Location: [place];  Service: ENT;  Laterality: Right;   SINUS ENDO W/FUSION Right 12/12/2016   Procedure: ENDOSCOPIC SINUS SURGERY WITH NAVIGATION;  Surgeon: [name], MD;  Location: [place];  Service: ENT;  Laterality: Right;   SPHENOIDECTOMY Right 12/12/2016   Procedure: RIGHT ENDOSCOPIC SPHENOIDECTOMY;  Surgeon: [name], MD;  Location: [place];  Service: ENT;  Laterality: Right;   TONSILLECTOMY AND ADENOIDECTOMY  child   TOTAL KNEE ARTHROPLASTY Left 10/14/2014   Procedure: LEFT TOTAL KNEE ARTHROPLASTY;  Surgeon: [name], MD;  Location: [facility];  Service: Orthopedics;  Laterality: Left;   TOTAL KNEE ARTHROPLASTY Right 1990s   TRANSTHORACIC ECHOCARDIOGRAM  12-24-2015   dr [name]   ef 55-60%,  grade 1 diastolic dysfunction/  trivial AR and TR  mild MR and PR/  moderate LAE   TRANSURETHRAL RESECTION OF PROSTATE  06-11-2010   dr Gaynelle Arabian  [facility]   w/  GYRUS   TRANSURETHRAL RESECTION OF PROSTATE N/A 12/06/2017   Procedure: TRANSURETHRAL RESECTION OF THE PROSTATE (TURP)/ BIPOLAR;  Surgeon: Ceasar Mons, MD;  [facility]: [facility];  Service: Urology;  Laterality: N/A;   TRANSURETHRAL RESECTION OF PROSTATE N/A 04/11/2018   Procedure: TRANSURETHRAL RESECTION OF THE PROSTATE (TURP);  Surgeon: Ceasar Mons, MD;  Location: [facility];  Service: Urology;  Laterality: N/A;   UPPER  GASTROINTESTINAL ENDOSCOPY      Allergies  Allergen Reactions   Cyclobenzaprine Other (See Comments)    Lost balance   Lisinopril Other (See Comments)    Affected ability to urinate   Relafen [Nabumetone] Other (See Comments)    Problems urinating afterwards    Statins Other (See Comments)    "my peeing"    Outpatient Encounter Medications as of 10/24/2022  Medication Sig   aspirin EC 81 MG tablet Take 81 mg by mouth daily.   AZO-CRANBERRY PO Take 1 tablet by mouth daily.   carvedilol (COREG) 6.25 MG tablet TAKE 1 TABLET BY MOUTH TWICE A DAY   Cyanocobalamin (VITAMIN B12) 1000 MCG TBCR Take 1,000 mcg  by mouth.   diazepam (VALIUM) 5 MG tablet Take 2.5 mg by mouth 2 (two) times daily. 1/2 tablet every 2-3 days (Patient not taking: Reported on 09/07/2022)   diphenhydramine-acetaminophen (TYLENOL PM) 25-500 MG TABS tablet Take 1 tablet by mouth at bedtime.    gabapentin (NEURONTIN) 300 MG capsule TAKE 1 CAPSULE EVERY DAY   levothyroxine (SYNTHROID) 25 MCG tablet Take 25 mcg by mouth every evening.    omeprazole (PRILOSEC) 20 MG capsule Take 1 capsule (20 mg total) by mouth daily.   polyethylene glycol (MIRALAX / GLYCOLAX) packet Take 17 g by mouth daily as needed for mild constipation.   Probiotic Product (PROBIOTIC PO) Take 1 capsule by mouth daily.   rosuvastatin (CRESTOR) 5 MG tablet TAKE 1 TABLET BY MOUTH DAILY. PLEASE KEEP UPCOMING APPT IN MARCH 2023 BEFORE MORE REFILLS   trimethoprim (TRIMPEX) 100 MG tablet Take 100 mg by mouth at bedtime.   Facility-Administered Encounter Medications as of 10/24/2022  Medication   0.9 %  sodium chloride infusion    Review of Systems:  Review of Systems  Constitutional:  Negative for activity change, appetite change, chills, diaphoresis and fever.  Respiratory:  Negative for cough and wheezing.   Cardiovascular:  Negative for chest pain and leg swelling.  Gastrointestinal:  Negative for abdominal pain, constipation, diarrhea, nausea and  vomiting.  Genitourinary:  Negative for dysuria and urgency.  Musculoskeletal:  Negative for back pain, myalgias and neck pain.  Skin:  Negative for rash.  Neurological:  Negative for weakness.  Psychiatric/Behavioral:  Negative for confusion.     Health Maintenance  Topic Date Due   COLONOSCOPY (Pts 45-59yr Insurance coverage will need to be confirmed)  02/21/2019   COVID-19 Vaccine (5 - Mixed Product risk series) 12/15/2020   Medicare Annual Wellness (AWV)  03/23/2022   Pneumonia Vaccine 88 Years old  Completed   INFLUENZA VACCINE  Completed   Zoster Vaccines- Shingrix  Completed   HPV VACCINES  Aged Out    Physical Exam: There were no vitals filed for this visit. There is no height or weight on file to calculate BMI. Physical Exam Constitutional:      General: He is not in acute distress.    Appearance: He is not diaphoretic.  HENT:     Head: Normocephalic and atraumatic.     Right Ear: Tympanic membrane normal.     Left Ear: Tympanic membrane normal.     Nose: Nose normal.     Mouth/Throat:     Mouth: Mucous membranes are moist.     Pharynx: Oropharynx is clear.  Eyes:     General:        Right eye: No discharge.        Left eye: No discharge.     Conjunctiva/sclera: Conjunctivae normal.     Pupils: Pupils are equal, round, and reactive to light.  Neck:     Thyroid: No thyromegaly.     Vascular: No JVD.     Trachea: No tracheal deviation.  Cardiovascular:     Rate and Rhythm: Normal rate and regular rhythm.     Heart sounds: No murmur heard. Pulmonary:     Effort: Pulmonary effort is normal. No respiratory distress.     Breath sounds: Normal breath sounds. No wheezing.  Abdominal:     General: Bowel sounds are normal. There is no distension.     Palpations: Abdomen is soft.     Tenderness: There is no abdominal tenderness.  Musculoskeletal:  Cervical back: Normal range of motion and neck supple.  Lymphadenopathy:     Cervical: No cervical adenopathy.   Skin:    General: Skin is warm and dry.  Neurological:     Mental Status: He is alert and oriented to person, place, and time.     Cranial Nerves: No cranial nerve deficit.     Labs reviewed: Basic Metabolic Panel: Recent Labs    02/08/22 0000 07/26/22 0000  NA 141 140  K 4.2 4.2  CL 105 104  CO2 28* 26*  BUN 10 12  CREATININE 0.7 0.7  CALCIUM 9.3 9.3  TSH 8.96* 6.85*   Liver Function Tests: Recent Labs    02/08/22 0000 07/26/22 0000  AST  --  24  ALT  --  15  ALKPHOS  --  77  ALBUMIN 4.1 4.1   No results for input(s): "LIPASE", "AMYLASE" in the last 8760 hours. No results for input(s): "AMMONIA" in the last 8760 hours. CBC: Recent Labs    03/07/22 1106 07/26/22 0000 09/07/22 1047  WBC 6.0 4.2 5.7  NEUTROABS 3.1  --  3.2  HGB 12.6* 12.9* 12.5*  HCT 40.3 39* 38.7*  MCV 95.0  --  95.1  PLT 134* 99* 107*   Lipid Panel: Recent Labs    02/08/22 0000  CHOL 136  HDL 59  LDLCALC 63  TRIG 70   Lab Results  Component Value Date   HGBA1C 5.2 04/10/2018    Procedures since last visit: No results found.  Assessment/Plan There are no diagnoses linked to this encounter.   Labs/tests ordered:  * No order type specified * Next appt:  Visit date not found   Total time **:  time greater than 50% of total time spent doing pt counseling and coordination of care

## 2022-10-24 ENCOUNTER — Non-Acute Institutional Stay: Payer: PPO | Admitting: Adult Health

## 2022-10-24 ENCOUNTER — Encounter: Payer: Self-pay | Admitting: Adult Health

## 2022-10-24 VITALS — BP 122/84 | HR 75 | Temp 97.8°F | Resp 17 | Ht 70.0 in | Wt 191.0 lb

## 2022-10-24 DIAGNOSIS — E782 Mixed hyperlipidemia: Secondary | ICD-10-CM

## 2022-10-24 DIAGNOSIS — N138 Other obstructive and reflux uropathy: Secondary | ICD-10-CM

## 2022-10-24 DIAGNOSIS — N401 Enlarged prostate with lower urinary tract symptoms: Secondary | ICD-10-CM

## 2022-10-24 DIAGNOSIS — K5901 Slow transit constipation: Secondary | ICD-10-CM | POA: Diagnosis not present

## 2022-10-24 DIAGNOSIS — I428 Other cardiomyopathies: Secondary | ICD-10-CM

## 2022-10-24 DIAGNOSIS — G609 Hereditary and idiopathic neuropathy, unspecified: Secondary | ICD-10-CM

## 2022-10-24 DIAGNOSIS — E538 Deficiency of other specified B group vitamins: Secondary | ICD-10-CM

## 2022-10-24 DIAGNOSIS — K219 Gastro-esophageal reflux disease without esophagitis: Secondary | ICD-10-CM

## 2022-10-24 MED ORDER — DIAZEPAM 5 MG PO TABS: 2.5000 mg | ORAL_TABLET | Freq: Two times a day (BID) | ORAL | 1 refills | Status: DC

## 2022-10-26 DIAGNOSIS — L821 Other seborrheic keratosis: Secondary | ICD-10-CM | POA: Diagnosis not present

## 2022-10-26 DIAGNOSIS — D692 Other nonthrombocytopenic purpura: Secondary | ICD-10-CM | POA: Diagnosis not present

## 2022-10-26 DIAGNOSIS — L720 Epidermal cyst: Secondary | ICD-10-CM | POA: Diagnosis not present

## 2022-10-26 DIAGNOSIS — L853 Xerosis cutis: Secondary | ICD-10-CM | POA: Diagnosis not present

## 2022-10-26 DIAGNOSIS — Z85828 Personal history of other malignant neoplasm of skin: Secondary | ICD-10-CM | POA: Diagnosis not present

## 2022-11-08 DIAGNOSIS — R3914 Feeling of incomplete bladder emptying: Secondary | ICD-10-CM | POA: Diagnosis not present

## 2022-11-08 DIAGNOSIS — N139 Obstructive and reflux uropathy, unspecified: Secondary | ICD-10-CM | POA: Diagnosis not present

## 2022-11-08 DIAGNOSIS — N312 Flaccid neuropathic bladder, not elsewhere classified: Secondary | ICD-10-CM | POA: Diagnosis not present

## 2023-01-05 ENCOUNTER — Inpatient Hospital Stay: Payer: PPO | Admitting: Oncology

## 2023-01-05 ENCOUNTER — Inpatient Hospital Stay: Payer: PPO | Attending: Oncology

## 2023-01-05 VITALS — BP 117/74 | HR 63 | Temp 98.2°F | Resp 20 | Ht 70.0 in | Wt 186.0 lb

## 2023-01-05 DIAGNOSIS — E538 Deficiency of other specified B group vitamins: Secondary | ICD-10-CM | POA: Diagnosis not present

## 2023-01-05 DIAGNOSIS — D696 Thrombocytopenia, unspecified: Secondary | ICD-10-CM | POA: Diagnosis not present

## 2023-01-05 DIAGNOSIS — D649 Anemia, unspecified: Secondary | ICD-10-CM | POA: Insufficient documentation

## 2023-01-05 LAB — CBC WITH DIFFERENTIAL (CANCER CENTER ONLY)
Abs Immature Granulocytes: 0.01 10*3/uL (ref 0.00–0.07)
Basophils Absolute: 0 10*3/uL (ref 0.0–0.1)
Basophils Relative: 1 %
Eosinophils Absolute: 0.1 10*3/uL (ref 0.0–0.5)
Eosinophils Relative: 2 %
HCT: 40.6 % (ref 39.0–52.0)
Hemoglobin: 13 g/dL (ref 13.0–17.0)
Immature Granulocytes: 0 %
Lymphocytes Relative: 44 %
Lymphs Abs: 1.8 10*3/uL (ref 0.7–4.0)
MCH: 30.7 pg (ref 26.0–34.0)
MCHC: 32 g/dL (ref 30.0–36.0)
MCV: 95.8 fL (ref 80.0–100.0)
Monocytes Absolute: 0.4 10*3/uL (ref 0.1–1.0)
Monocytes Relative: 9 %
Neutro Abs: 1.8 10*3/uL (ref 1.7–7.7)
Neutrophils Relative %: 44 %
Platelet Count: 94 10*3/uL — ABNORMAL LOW (ref 150–400)
RBC: 4.24 MIL/uL (ref 4.22–5.81)
RDW: 19.1 % — ABNORMAL HIGH (ref 11.5–15.5)
WBC Count: 4 10*3/uL (ref 4.0–10.5)
nRBC: 0 % (ref 0.0–0.2)

## 2023-01-05 NOTE — Progress Notes (Signed)
  [place] OFFICE PROGRESS NOTE   Diagnosis: Thrombocytopenia  INTERVAL HISTORY:   David Gutierrez returns as scheduled.  No fever or pain.  No bleeding.  He continues self-catheterization.  He reports constipation and intermittent fecal incontinence.  He drinks approximately 4 ounces of liquor each night.  Objective:  Vital signs in last 24 hours:  Blood pressure 117/74, pulse 63, temperature 98.2 F (36.8 C), temperature source Oral, resp. rate 20, height '5\' 10"'$  (1.778 m), weight 186 lb (84.4 kg), SpO2 100 %.    Lymphatics: No cervical, supraclavicular, axillary, or inguinal nodes Resp: Distant breath sounds, no respiratory distress Cardio: Distant heart sounds, regular rhythm GI: No hepatosplenomegaly Vascular: No leg edema, chronic stasis change at the lower leg bilaterally   Lab Results:  Lab Results  Component Value Date   WBC 4.0 01/05/2023   HGB 13.0 01/05/2023   HCT 40.6 01/05/2023   MCV 95.8 01/05/2023   PLT 94 (L) 01/05/2023   NEUTROABS 1.8 01/05/2023    CMP  Lab Results  Component Value Date   NA 140 07/26/2022   K 4.2 07/26/2022   CL 104 07/26/2022   CO2 26 (A) 07/26/2022   GLUCOSE 91 04/10/2018   BUN 12 07/26/2022   CREATININE 0.7 07/26/2022   CALCIUM 9.3 07/26/2022   PROT 5.4 (L) 03/03/2017   ALBUMIN 4.1 07/26/2022   AST 24 07/26/2022   ALT 15 07/26/2022   ALKPHOS 77 07/26/2022   BILITOT 1.0 03/03/2017   GFRNONAA >60 04/10/2018   GFRAA >60 04/10/2018     Medications: I have reviewed the patient's current medications.   Assessment/Plan: Mild thrombocytopenia and anemia BPH/neurogenic bladder, self catheterizes Hypothyroid CAD Nonischemic cardiomyopathy Chronic left bundle branch block Peripheral neuropathy Esophageal stricture/dysphagia followed by Dr. 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 Barbian is stable from a hematologic standpoint.  He has chronic mild thrombocytopenia.  The thrombocytopenia may be related to alcohol use, chronic liver disease, myelodysplasia, or chronic ITP.  We will refer him for a bone marrow biopsy if he develops progressive thrombocytopenia or new hematologic abnormalities.  We will follow-up on the myeloma panel from today.  Mr. Valent will return for an office visit and CBC in 6 months.  He will follow-up with Dr. Lyndel Safe for management of the constipation and incontinence.  Betsy Coder, MD  01/05/2023  12:26 PM

## 2023-01-06 LAB — KAPPA/LAMBDA LIGHT CHAINS
Kappa free light chain: 23.2 mg/L — ABNORMAL HIGH (ref 3.3–19.4)
Kappa, lambda light chain ratio: 1.29 (ref 0.26–1.65)
Lambda free light chains: 18 mg/L (ref 5.7–26.3)

## 2023-01-09 NOTE — Progress Notes (Signed)
Cardiology Office Note    Date:  02/04/22  ID:  David Gutierrez, DOB 1933-09-15, MRN [id number]  PCP:  David Dad, MD  Cardiologist:  Dr. Harrington Challenger   History of Present Illness:   David Gutierrez is a 89 y.o. male with history of nonobstructive coronary artery disease, nonischemic cardiomyopathy with improved LV function, chronic left bundle branch block, GERD with esophageal stricture/dysphagia (followed by Dr. Henrene Gutierrez) and hyperlipidemia presents for follow-up.  Echocardiogram 10/24/2012 with LV function of 40%, mild mitral regurgitation. Cardiac catheterization in 2013 showed minimal noncompressive CAD. Last echocardiogram January 2017 showed LV function of 55 to 93%, grade 1 diastolic dysfunction and mild mitral regurgitation.  Sicne seen the pt denies CP  Breathing is OK  Denies dizziness  No syncope  No palpitations    Notes occsaional difficulty swallowing    I saw the pt in clinic in March 2023 Past Medical History:  Diagnosis Date   Alcoholic peripheral neuropathy [facility])    neurologist-  dr patel--- feet numbness and sensory ataxia   Arthritis    B12 deficiency    BPH (benign prostatic hyperplasia)    CAD (coronary artery disease) cardiolgoist-  dr Harrell Gave end (previouly dr dalton [name])   a. Myoview 11/13:  EF 38%, inf and IS defect c/w scar but no ischemia:  b. Cardiac CT 11/13:  Ca score 318 Agatson units, pLAD and dCFX plaque;   c. LHC 11/07/12:  pLAD 30%, oD1 40%, oCFX 30%, dCFX 40-50%, mRCA 40%, EF 50% (frequent PVCs and short run of NSVT with injection/    per last echo 01/ 2017  ef 55-60%   Chronic constipation    Diverticulosis of colon    Esophageal reflux    External hemorrhoids    First degree heart block    Hiatal hernia    History of adenomatous polyp of colon    History of esophageal stricture 08/11/2015   s/p  dilatation   History of lower GI bleeding 03/01/2017   s/p  flexiable simoidscopy w/ clipping rectum ulcer   Hypogonadism male     Hypothyroidism    Interstitial cystitis    Irritable bowel syndrome    LBBB (left bundle branch block)    NICM (nonischemic cardiomyopathy) [facility]) cardiologist-  dr Harrell Gave end (previously dr dalton [name])--  per last echo 01/ 2017  ef 55-60%   ? 2/2 LBBB => a. echo 11/13: diff HK, worse in septum and apex, mod LVE, mild LVH, EF 40%, mild AI, mild MR, mod LAE   Pre-diabetes    Self-catheterizes urinary bladder    bid to tid    Wears glasses    Wears hearing aid in both ears     Past Surgical History:  Procedure Laterality Date   CARDIAC CATHETERIZATION  11-07-2012    dr [name]   nonobstructive CAD (pLAD 30%, ostial D1 40%, ostial LCx 30%, dLCx diffuse 40-50%, mRCA 40%) ;  LVSF 50% but diffiult due to PVCs and short run VT with injection;  cardiomyopathy mostly likely a LBBB cardiomyopathy   CARDIOVASCULAR STRESS TEST  10-16-2012   dr [name]   Low risk adenosine nuclear study (no exercise) w/ a fixed inferior and inferoseptal defect without ischemia (question as whether this abnormality due to LBBB cardiomyopathy or due to scar)/  LV ef 38%,  LV wall motion decreased of the septum and entire apex   CATARACT EXTRACTION W/ INTRAOCULAR LENS  IMPLANT, BILATERAL  2012  approx.   COLONOSCOPY  2016  Dr. Henrene Gutierrez, Per new patient form   CYSTO/  HYDRODISTENTION/  BLADDER BIOPSY  04-20-2005    dr David Gutierrez  [facility]   ETHMOIDECTOMY Right 12/12/2016   Procedure: RIGHT ENDOSCOPIC ETHMOIDECTOMY;  Surgeon: David Baptist, MD;  Location: [place];  Service: ENT;  Laterality: Right;   FLEXIBLE SIGMOIDOSCOPY N/A 03/02/2017   Procedure: FLEXIBLE SIGMOIDOSCOPY;  Surgeon: Ladene Artist, MD;  Location: [facility];  Service: Endoscopy;  Laterality: N/A;   LUMBAR LAMINECTOMY  1952   MAXILLARY ANTROSTOMY Right 12/12/2016   Procedure: RIGHT ENDOSCOPIC MAXILLARY ANTROSTOMY;  Surgeon: David Baptist, MD;  Location: [place];  Service: ENT;  Laterality: Right;   SINUS ENDO W/FUSION Right  12/12/2016   Procedure: ENDOSCOPIC SINUS SURGERY WITH NAVIGATION;  Surgeon: David Baptist, MD;  Location: [place];  Service: ENT;  Laterality: Right;   SPHENOIDECTOMY Right 12/12/2016   Procedure: RIGHT ENDOSCOPIC SPHENOIDECTOMY;  Surgeon: David Baptist, MD;  Location: [place];  Service: ENT;  Laterality: Right;   TONSILLECTOMY AND ADENOIDECTOMY  child   TOTAL KNEE ARTHROPLASTY Left 10/14/2014   Procedure: LEFT TOTAL KNEE ARTHROPLASTY;  Surgeon: David Pole, MD;  Location: [facility];  Service: Orthopedics;  Laterality: Left;   TOTAL KNEE ARTHROPLASTY Right 1990s   TRANSTHORACIC ECHOCARDIOGRAM  12-24-2015   dr [name]   ef 55-60%,  grade 1 diastolic dysfunction/  trivial AR and TR  mild MR and PR/  moderate LAE   TRANSURETHRAL RESECTION OF PROSTATE  06-11-2010   dr David Gutierrez  [facility]   w/  GYRUS   TRANSURETHRAL RESECTION OF PROSTATE N/A 12/06/2017   Procedure: TRANSURETHRAL RESECTION OF THE PROSTATE (TURP)/ BIPOLAR;  Surgeon: David Mons, MD;  Location: [facility];  Service: Urology;  Laterality: N/A;   TRANSURETHRAL RESECTION OF PROSTATE N/A 04/11/2018   Procedure: TRANSURETHRAL RESECTION OF THE PROSTATE (TURP);  Surgeon: David Mons, MD;  Location: [facility];  Service: Urology;  Laterality: N/A;   UPPER GASTROINTESTINAL ENDOSCOPY      Current Medications:  Prior to Admission medications   Medication Sig Start Date End Date Taking? Authorizing Provider  aspirin EC 81 MG tablet Take 81 mg by mouth daily.   Yes [provider]  AZO-CRANBERRY PO Take 2 tablets by mouth 2 (two) times daily.   Yes [provider]  b complex vitamins capsule Take 1 capsule by mouth daily.   Yes [provider]  carvedilol (COREG) 6.25 MG tablet TAKE 1 TABLET BY MOUTH TWICE A DAY 10/10/19  Yes Fay Records, MD  [employer] B-1 100 MG tablet Take 100 mg by mouth daily. 04/15/20  Yes [provider]  [employer] STOOL SOFTENER 100 MG  capsule Take 100 mg by mouth as needed for constipation. 04/17/20  Yes [provider]  Cyanocobalamin (B-12) 1000 MCG LOZG Place 1 tablet under the tongue daily. 03/18/20  Yes [provider]  diazepam (VALIUM) 5 MG tablet Take 2.5 mg by mouth 2 (two) times daily.  04/16/14  Yes [provider]  diphenhydramine-acetaminophen (TYLENOL PM) 25-500 MG TABS tablet Take 1 tablet by mouth at bedtime.    Yes [provider]  levothyroxine (LEVOTHROID) 25 MCG tablet Take 25 mcg by mouth every evening.  12/28/15  Yes Larey Dresser, MD  omeprazole (PRILOSEC) 20 MG capsule Take 1 capsule (20 mg total) by mouth daily. 01/15/20  Yes Irene Shipper, MD  polyethylene glycol [facility] Floria Raveling) packet Take 17 g by  mouth daily as needed for mild constipation.   Yes [provider]  Probiotic Product (PROBIOTIC PO) Take 1 capsule by mouth daily.   Yes [provider]  rosuvastatin (CRESTOR) 5 MG tablet TAKE ONE TABLET DAILY 12/13/19  Yes End, Harrell Gave, MD  trimethoprim (TRIMPEX) 100 MG tablet Take 100 mg by mouth at bedtime. 09/17/18  Yes [provider]     Allergies:   Cyclobenzaprine, Lisinopril, Relafen [nabumetone], and Statins   Social History   Socioeconomic History   Marital status: Married    Spouse name: Not on file   Number of children: 3   Years of education: Not on file   Highest education level: Not on file  Occupational History   Occupation: retired  Tobacco Use   Smoking status: Former    Packs/day: 2.00    Years: 15.00    Total pack years: 30.00    Types: Cigarettes    Quit date: 11/30/1967    Years since quitting: 55.1   Smokeless tobacco: Never  Vaping Use   Vaping Use: Never used  Substance and Sexual Activity   Alcohol use: Yes    Alcohol/week: 2.0 standard drinks of alcohol    Types: 2 Standard drinks or equivalent per week    Comment: occasional   Drug use: No   Sexual activity: Yes  Other Topics Concern    Not on file  Social History Narrative   Do you drink/eat things with caffeine? Very Little    Marital status/What year were you married? Married since [place] you live in a house, apartment, assisted living, condo, trailer, etc.? Did not answer   Is it one or more stories? One   How many persons live in your home? 2    Do you have any pets in your home? No   Current or past profession: Academic librarian.    Do you exercise? Very little    Type and how often? Did not answer    Do you have a living will? Yes   Do you have a DNR form? Did not answer   Do you have signed POA/HPOA forms? Did not answer   Social Determinants of Health   Financial Resource Strain: Not on file  Food Insecurity: Not on file  Transportation Needs: Not on file  Physical Activity: Not on file  Stress: Not on file  Social Connections: Not on file     Family History:  The patient's family history includes Diabetes type I in his son; Healthy in his father, mother, and sister; Heart disease in his brother; Other in his sister; Thyroid cancer in his brother.   ROS:   Please see the history of present illness.    ROS All other systems reviewed and are negative.   PHYSICAL EXAM:   VS:  There were no vitals taken for this visit.   GEN: Well nourished, well developed, in no acute distress  HEENT: normal  Neck: no JVD, carotid bruits, or masses Cardiac: RRR; no murmurs, rubs, or gallops,no edema  Respiratory:  clear to auscultation bilaterally, normal work of breathing GI: soft, nontender, nondistended, + BS MS: no deformity or atrophy  Skin: warm and dry, no rash Neuro:  Alert and Oriented x 3, Strength and sensation are intact Psych: euthymic mood, full affect  Wt Readings from Last 3 Encounters:  01/05/23 186 lb (84.4 kg)  10/24/22 191 lb (86.6 kg)  09/07/22 187 lb (84.8 kg)      Studies/Labs  Reviewed:   EKG:  EKG is ordered today.    NSR yo   LBBB   First degree AV block   PR 314 msec  Recent  Labs: 07/26/2022: ALT 15; BUN 12; Creatinine 0.7; Potassium 4.2; Sodium 140; TSH 6.85 01/05/2023: Hemoglobin 13.0; Platelet Count 94   Lipid Panel    Component Value Date/Time   CHOL 136 02/08/2022 0000   TRIG 70 02/08/2022 0000   HDL 59 02/08/2022 0000   CHOLHDL 3.2 03/24/2016 0735   VLDL 18 03/24/2016 0735   LDLCALC 63 02/08/2022 0000    Additional studies/ records that were reviewed today include:   Echocardiogram: 12/2015 Study Conclusions   - Left ventricle: The cavity size was normal. There was mild focal    basal hypertrophy of the septum. Systolic function was normal.    The estimated ejection fraction was in the range of 55% to 60%.    Wall motion was normal; there were no regional wall motion    abnormalities. Doppler parameters are consistent with abnormal    left ventricular relaxation (grade 1 diastolic dysfunction).  - Aortic valve: There was trivial regurgitation.  - Mitral valve: There was mild regurgitation.  - Left atrium: The atrium was moderately dilated.  - Right ventricle: The cavity size was normal. Wall thickness was    normal. Systolic function was normal.   Coronary angiography:11/2012 Coronary dominance: right   Left mainstem: No significant disease.    Left anterior descending (LAD): 30% proximal LAD stenosis.  40% ostial D1 stenosis.    Left circumflex (LCx): Moderate ramus with luminal irregularities.  30% ostial LCx.  Diffuse disease up to 40-50% in the distal LCx.    Right coronary artery (RCA): 40% mid RCA stenosis.     Left ventriculography: Left ventricular systolic function is probably mildly decreased, LVEF is estimated at 50% but difficult due to PVCs and short run VT with injection.    Final Conclusions:  Nonobstructive CAD.  I suspect that Mr Stuard cardiomyopathy is most likely a LBBB cardiomyopathy.  I have started him on Coreg and lisinopril.  He should continue ASA and atorvastatin due to significant, though nonobstructive, CAD.       ASSESSMENT & PLAN:    NICM - Last echo in 12/2015 showed normal LVEF.  No CHF symptoms.  Volume is OK on exa   Follow    2. Non obstructive CAD by cath in 2013 Remains asympomtatic     Continue ASA  b blocker and statin  Continue aspirin  3  HL    Continue Crestor   -Continue statin -Followed by PCP  4.  Alcohol induced peripheral neuropathy with numbness and gait imbalance -Followed by neurologist -He uses cane for ambulation now prescribed walker -   Medication Adjustments/Labs and Tests Ordered: Current medicines are reviewed at length with the patient today.  Concerns regarding medicines are outlined above.  Medication changes, Labs and Tests ordered today are listed in the Patient Instructions below. There are no Patient Instructions on file for this visit.    Signed, Dorris Carnes, MD  01/09/2023 9:26 PM    [address], [place], [place]  [address] Phone: [phone]; Fax: [phone]

## 2023-01-10 ENCOUNTER — Ambulatory Visit: Payer: PPO | Attending: Internal Medicine | Admitting: Internal Medicine

## 2023-01-10 ENCOUNTER — Encounter: Payer: Self-pay | Admitting: Internal Medicine

## 2023-01-10 VITALS — BP 110/60 | HR 66 | Ht 71.0 in | Wt 189.0 lb

## 2023-01-10 DIAGNOSIS — I2510 Atherosclerotic heart disease of native coronary artery without angina pectoris: Secondary | ICD-10-CM | POA: Diagnosis not present

## 2023-01-10 DIAGNOSIS — Z1329 Encounter for screening for other suspected endocrine disorder: Secondary | ICD-10-CM

## 2023-01-10 LAB — MULTIPLE MYELOMA PANEL, SERUM
Albumin SerPl Elph-Mcnc: 4 g/dL (ref 2.9–4.4)
Albumin/Glob SerPl: 1.8 — ABNORMAL HIGH (ref 0.7–1.7)
Alpha 1: 0.2 g/dL (ref 0.0–0.4)
Alpha2 Glob SerPl Elph-Mcnc: 0.5 g/dL (ref 0.4–1.0)
B-Globulin SerPl Elph-Mcnc: 0.8 g/dL (ref 0.7–1.3)
Gamma Glob SerPl Elph-Mcnc: 0.8 g/dL (ref 0.4–1.8)
Globulin, Total: 2.3 g/dL (ref 2.2–3.9)
IgA: 273 mg/dL (ref 61–437)
IgG (Immunoglobin G), Serum: 836 mg/dL (ref 603–1613)
IgM (Immunoglobulin M), Srm: 77 mg/dL (ref 15–143)
Total Protein ELP: 6.3 g/dL (ref 6.0–8.5)

## 2023-01-10 NOTE — Patient Instructions (Signed)
Medication Instructions:   *If you need a refill on your cardiac medications before your next appointment, please call your pharmacy*   Lab Work: TSH If you have labs (blood work) drawn today and your tests are completely normal, you will receive your results only by: [place] (if you have MyChart) OR A paper copy in the mail If you have any lab test that is abnormal or we need to change your treatment, we will call you to review the results.   Testing/Procedures:    Follow-Up: At [facility], you and your health needs are our priority.  As part of our continuing mission to provide you with exceptional heart care, we have created designated Provider Care Teams.  These Care Teams include your primary Cardiologist (physician) and Advanced Practice Providers (APPs -  Physician Assistants and Nurse Practitioners) who all work together to provide you with the care you need, when you need it.  We recommend signing up for the patient portal called "MyChart".  Sign up information is provided on this After Visit Summary.  MyChart is used to connect with patients for Virtual Visits (Telemedicine).  Patients are able to view lab/test results, encounter notes, upcoming appointments, etc.  Non-urgent messages can be sent to your provider as well.   To learn more about what you can do with MyChart, go to [email or web address].    Your next appointment:   8 month(s)  Provider:   Dorris Carnes, MD     Other Instructions

## 2023-01-11 LAB — TSH: TSH: 8.4 u[IU]/mL — ABNORMAL HIGH (ref 0.450–4.500)

## 2023-01-23 DIAGNOSIS — N312 Flaccid neuropathic bladder, not elsewhere classified: Secondary | ICD-10-CM | POA: Diagnosis not present

## 2023-01-23 DIAGNOSIS — N3020 Other chronic cystitis without hematuria: Secondary | ICD-10-CM | POA: Diagnosis not present

## 2023-02-13 DIAGNOSIS — E538 Deficiency of other specified B group vitamins: Secondary | ICD-10-CM | POA: Diagnosis not present

## 2023-02-13 DIAGNOSIS — E785 Hyperlipidemia, unspecified: Secondary | ICD-10-CM | POA: Diagnosis not present

## 2023-02-13 DIAGNOSIS — I429 Cardiomyopathy, unspecified: Secondary | ICD-10-CM | POA: Diagnosis not present

## 2023-02-13 DIAGNOSIS — E559 Vitamin D deficiency, unspecified: Secondary | ICD-10-CM | POA: Diagnosis not present

## 2023-02-13 DIAGNOSIS — F10288 Alcohol dependence with other alcohol-induced disorder: Secondary | ICD-10-CM | POA: Diagnosis not present

## 2023-02-13 DIAGNOSIS — F419 Anxiety disorder, unspecified: Secondary | ICD-10-CM | POA: Diagnosis not present

## 2023-02-13 DIAGNOSIS — G4700 Insomnia, unspecified: Secondary | ICD-10-CM | POA: Diagnosis not present

## 2023-02-13 DIAGNOSIS — I4891 Unspecified atrial fibrillation: Secondary | ICD-10-CM | POA: Diagnosis not present

## 2023-02-13 DIAGNOSIS — E663 Overweight: Secondary | ICD-10-CM | POA: Diagnosis not present

## 2023-02-13 DIAGNOSIS — G621 Alcoholic polyneuropathy: Secondary | ICD-10-CM | POA: Diagnosis not present

## 2023-02-13 DIAGNOSIS — D6869 Other thrombophilia: Secondary | ICD-10-CM | POA: Diagnosis not present

## 2023-02-13 DIAGNOSIS — E039 Hypothyroidism, unspecified: Secondary | ICD-10-CM | POA: Diagnosis not present

## 2023-02-21 ENCOUNTER — Non-Acute Institutional Stay: Payer: PPO | Admitting: Internal Medicine

## 2023-02-21 ENCOUNTER — Encounter: Payer: Self-pay | Admitting: Internal Medicine

## 2023-02-21 VITALS — BP 124/82 | HR 65 | Temp 97.6°F | Resp 17 | Ht 71.0 in | Wt 186.0 lb

## 2023-02-21 DIAGNOSIS — N312 Flaccid neuropathic bladder, not elsewhere classified: Secondary | ICD-10-CM | POA: Diagnosis not present

## 2023-02-21 DIAGNOSIS — E039 Hypothyroidism, unspecified: Secondary | ICD-10-CM

## 2023-02-21 DIAGNOSIS — K219 Gastro-esophageal reflux disease without esophagitis: Secondary | ICD-10-CM

## 2023-02-21 DIAGNOSIS — E782 Mixed hyperlipidemia: Secondary | ICD-10-CM | POA: Diagnosis not present

## 2023-02-21 DIAGNOSIS — K862 Cyst of pancreas: Secondary | ICD-10-CM | POA: Diagnosis not present

## 2023-02-21 DIAGNOSIS — G609 Hereditary and idiopathic neuropathy, unspecified: Secondary | ICD-10-CM | POA: Diagnosis not present

## 2023-02-21 DIAGNOSIS — I428 Other cardiomyopathies: Secondary | ICD-10-CM | POA: Diagnosis not present

## 2023-02-21 DIAGNOSIS — F1090 Alcohol use, unspecified, uncomplicated: Secondary | ICD-10-CM | POA: Diagnosis not present

## 2023-02-21 DIAGNOSIS — F5101 Primary insomnia: Secondary | ICD-10-CM | POA: Diagnosis not present

## 2023-02-21 DIAGNOSIS — D696 Thrombocytopenia, unspecified: Secondary | ICD-10-CM | POA: Diagnosis not present

## 2023-02-21 LAB — BASIC METABOLIC PANEL
BUN: 11 (ref 4–21)
CO2: 28 — AB (ref 13–22)
Chloride: 104 (ref 99–108)
Creatinine: 0.6 (ref 0.6–1.3)
Glucose: 79
Potassium: 4.7 mEq/L (ref 3.5–5.1)
Sodium: 141 (ref 137–147)

## 2023-02-21 LAB — TSH: TSH: 9.14 — AB (ref 0.41–5.90)

## 2023-02-21 LAB — COMPREHENSIVE METABOLIC PANEL
Albumin: 4.1 (ref 3.5–5.0)
Calcium: 9.3 (ref 8.7–10.7)
eGFR: >90

## 2023-02-21 LAB — HEPATIC FUNCTION PANEL
ALT: 15 U/L (ref 10–40)
AST: 26 (ref 14–40)
Alkaline Phosphatase: 85 (ref 25–125)
Bilirubin, Total: 0.5

## 2023-02-21 LAB — CBC AND DIFFERENTIAL
HCT: 38 — AB (ref 41–53)
Hemoglobin: 12.6 — AB (ref 13.5–17.5)
Platelets: 112 10*3/uL — AB (ref 150–400)
WBC: 4.2

## 2023-02-21 LAB — CBC: RBC: 3.96 (ref 3.87–5.11)

## 2023-02-21 LAB — VITAMIN B12: Vitamin B-12: 920

## 2023-02-21 NOTE — Patient Instructions (Signed)
Take Plain Tylenol instead of Tylenol PM

## 2023-02-22 DIAGNOSIS — N139 Obstructive and reflux uropathy, unspecified: Secondary | ICD-10-CM | POA: Diagnosis not present

## 2023-02-22 DIAGNOSIS — N312 Flaccid neuropathic bladder, not elsewhere classified: Secondary | ICD-10-CM | POA: Diagnosis not present

## 2023-02-22 NOTE — Progress Notes (Signed)
Location:  [facility] features editor of Service:  Clinic (12)  Provider:   Code Status:  Goals of Care:     02/21/2023    1:02 PM  Advanced Directives  Does Patient Have a Medical Advance Directive? Yes  Type of Estate agent of [place];Living will;Out of facility DNR (pink MOST or yellow form)  Does patient want to make changes to medical advance directive? No - Patient declined  Copy of Healthcare Power of Attorney in Chart? No - copy requested     Chief Complaint  Patient presents with   Medical Management of Chronic Issues    4 month follow up     HPI: Patient is a 89 y.o. male seen today for medical management of chronic diseases.   [place]  History of peripheral neuropathy sees Dr. Allena Katz.  On gabapentin.  Just takes it at night Worsening gait Uses Cane  Has a history of nonobstructive CAD, nonischemic cardiomyopathy, left bundle branch block and HLD Last echo showed EF of 55 to 60% with mild MR Follows with Cardiology  History of flaccid neurogenic bladder  catheterize at least once or twice in a day Recurrent UTIs follows with urology    History of insomnia with restless leg.  Is on Valium and Tylenol PM Pancreatic Cyst CT imaging Stable Recommended No Follow up needed Thrombocytopenia Saw Hematology  No New issues Continues to be stable Past Medical History:  Diagnosis Date   Alcoholic peripheral neuropathy [facility])    neurologist-  dr patel--- feet numbness and sensory ataxia   Arthritis    B12 deficiency    BPH (benign prostatic hyperplasia)    CAD (coronary artery disease) cardiolgoist-  dr Cristal Deer end (previouly dr dalton Shirlee Latch)   a. Myoview 11/13:  EF 38%, inf and IS defect c/w scar but no ischemia:  b. Cardiac CT 11/13:  Ca score 318 Agatson units, pLAD and dCFX plaque;   c. LHC 11/07/12:  pLAD 30%, oD1 40%, oCFX 30%, dCFX 40-50%, mRCA 40%, EF 50% (frequent PVCs and short run of NSVT with  injection/    per last echo 01/ 2017  ef 55-60%   Chronic constipation    Diverticulosis of colon    Esophageal reflux    External hemorrhoids    First degree heart block    Hiatal hernia    History of adenomatous polyp of colon    History of esophageal stricture 08/11/2015   s/p  dilatation   History of lower GI bleeding 03/01/2017   s/p  flexiable simoidscopy w/ clipping rectum ulcer   Hypogonadism male    Hypothyroidism    Interstitial cystitis    Irritable bowel syndrome    LBBB (left bundle branch block)    NICM (nonischemic cardiomyopathy) [facility]) cardiologist-  dr Cristal Deer end (previously dr dalton Shirlee Latch)--  per last echo 01/ 2017  ef 55-60%   ? 2/2 LBBB => a. echo 11/13: diff HK, worse in septum and apex, mod LVE, mild LVH, EF 40%, mild AI, mild MR, mod LAE   Pre-diabetes    Self-catheterizes urinary bladder    bid to tid    Wears glasses    Wears hearing aid in both ears     Past Surgical History:  Procedure Laterality Date   CARDIAC CATHETERIZATION  11-07-2012    dr Shirlee Latch   nonobstructive CAD (pLAD 30%, ostial D1 40%, ostial LCx 30%, dLCx diffuse 40-50%, mRCA 40%) ;  LVSF 50%  but diffiult due to PVCs and short run VT with injection;  cardiomyopathy mostly likely a LBBB cardiomyopathy   CARDIOVASCULAR STRESS TEST  10-16-2012   dr Shirlee Latch   Low risk adenosine nuclear study (no exercise) w/ a fixed inferior and inferoseptal defect without ischemia (question as whether this abnormality due to LBBB cardiomyopathy or due to scar)/  LV ef 38%,  LV wall motion decreased of the septum and entire apex   CATARACT EXTRACTION W/ INTRAOCULAR LENS  IMPLANT, BILATERAL  2012  approx.   COLONOSCOPY  2016   Dr. Marina Goodell, Per new patient form   CYSTO/  HYDRODISTENTION/  BLADDER BIOPSY  04-20-2005    dr Darvin Neighbours  [facility]   ETHMOIDECTOMY Right 12/12/2016   Procedure: RIGHT ENDOSCOPIC ETHMOIDECTOMY;  Surgeon: Newman Pies, MD;  Location: [facility];  Service: ENT;  Laterality: Right;    FLEXIBLE SIGMOIDOSCOPY N/A 03/02/2017   Procedure: FLEXIBLE SIGMOIDOSCOPY;  Surgeon: Meryl Dare, MD;  Location: [facility];  Service: Endoscopy;  Laterality: N/A;   LUMBAR LAMINECTOMY  1952   MAXILLARY ANTROSTOMY Right 12/12/2016   Procedure: RIGHT ENDOSCOPIC MAXILLARY ANTROSTOMY;  Surgeon: Newman Pies, MD;  Location: [facility];  Service: ENT;  Laterality: Right;   SINUS ENDO W/FUSION Right 12/12/2016   Procedure: ENDOSCOPIC SINUS SURGERY WITH NAVIGATION;  Surgeon: Newman Pies, MD;  Location: [facility];  Service: ENT;  Laterality: Right;   SPHENOIDECTOMY Right 12/12/2016   Procedure: RIGHT ENDOSCOPIC SPHENOIDECTOMY;  Surgeon: Newman Pies, MD;  Location: [facility];  Service: ENT;  Laterality: Right;   TONSILLECTOMY AND ADENOIDECTOMY  child   TOTAL KNEE ARTHROPLASTY Left 10/14/2014   Procedure: LEFT TOTAL KNEE ARTHROPLASTY;  Surgeon: Shelda Pal, MD;  Location: [facility];  Service: Orthopedics;  Laterality: Left;   TOTAL KNEE ARTHROPLASTY Right 1990s   TRANSTHORACIC ECHOCARDIOGRAM  12-24-2015   dr Shirlee Latch   ef 55-60%,  grade 1 diastolic dysfunction/  trivial AR and TR  mild MR and PR/  moderate LAE   TRANSURETHRAL RESECTION OF PROSTATE  06-11-2010   dr Patsi Sears  [facility]   w/  GYRUS   TRANSURETHRAL RESECTION OF PROSTATE N/A 12/06/2017   Procedure: TRANSURETHRAL RESECTION OF THE PROSTATE (TURP)/ BIPOLAR;  Surgeon: Rene Paci, MD;  Location: [facility];  Service: Urology;  Laterality: N/A;   TRANSURETHRAL RESECTION OF PROSTATE N/A 04/11/2018   Procedure: TRANSURETHRAL RESECTION OF THE PROSTATE (TURP);  Surgeon: Rene Paci, MD;  Location: [facility];  Service: Urology;  Laterality: N/A;   UPPER GASTROINTESTINAL ENDOSCOPY      Allergies  Allergen Reactions   Cyclobenzaprine Other (See Comments)    Lost balance   Lisinopril Other (See Comments)    Affected ability to urinate   Relafen [Nabumetone] Other (See  Comments)    Problems urinating afterwards    Statins Other (See Comments)    "my peeing"    Outpatient Encounter Medications as of 02/21/2023  Medication Sig   aspirin EC 81 MG tablet Take 81 mg by mouth daily.   AZO-CRANBERRY PO Take 1 tablet by mouth daily.   carvedilol (COREG) 6.25 MG tablet TAKE 1 TABLET BY MOUTH TWICE A DAY   Cyanocobalamin (VITAMIN B12) 1000 MCG TBCR Take 1,000 mcg by mouth.   diazepam (VALIUM) 5 MG tablet Take 0.5 tablets (2.5 mg total) by mouth 2 (two) times daily. 1/2 tablet every 2-3 days   gabapentin (NEURONTIN) 300 MG capsule TAKE 1 CAPSULE EVERY DAY  levothyroxine (SYNTHROID) 25 MCG tablet Take 25 mcg by mouth every evening.    omeprazole (PRILOSEC) 20 MG capsule Take 1 capsule (20 mg total) by mouth daily.   polyethylene glycol (MIRALAX / GLYCOLAX) packet Take 17 g by mouth daily as needed for mild constipation.   Probiotic Product (PROBIOTIC PO) Take 1 capsule by mouth daily.   trimethoprim (TRIMPEX) 100 MG tablet Take 100 mg by mouth at bedtime.   [DISCONTINUED] diphenhydramine-acetaminophen (TYLENOL PM) 25-500 MG TABS tablet Take 1 tablet by mouth at bedtime.    Facility-Administered Encounter Medications as of 02/21/2023  Medication   0.9 %  sodium chloride infusion    Review of Systems:  Review of Systems  Constitutional:  Negative for activity change, appetite change and unexpected weight change.  HENT: Negative.    Respiratory:  Negative for cough and shortness of breath.   Cardiovascular:  Negative for leg swelling.  Gastrointestinal:  Negative for constipation.  Genitourinary:  Negative for frequency.  Musculoskeletal:  Positive for gait problem. Negative for arthralgias and myalgias.  Skin: Negative.  Negative for rash.  Neurological:  Negative for dizziness and weakness.  Psychiatric/Behavioral:  Negative for confusion and sleep disturbance.   All other systems reviewed and are negative.   Health Maintenance  Topic Date Due    Medicare Annual Wellness (AWV)  03/23/2022   COVID-19 Vaccine (5 - 2023-24 season) 03/09/2023 (Originally 08/05/2022)   DTaP/Tdap/Td (5 - Td or Tdap) 12/06/2027   Pneumonia Vaccine 68+ Years old  Completed   INFLUENZA VACCINE  Completed   Zoster Vaccines- Shingrix  Completed   HPV VACCINES  Aged Out   COLONOSCOPY (Pts 45-43yrs Insurance coverage will need to be confirmed)  Discontinued    Physical Exam: Vitals:   02/21/23 1259  BP: 124/82  Pulse: 65  Resp: 17  Temp: 97.6 F (36.4 C)  TempSrc: Temporal  SpO2: 97%  Weight: 186 lb (84.4 kg)  Height: 5\' 11"  (1.803 m)   Body mass index is 25.94 kg/m. Physical Exam Vitals reviewed.  Constitutional:      Appearance: Normal appearance.  HENT:     Head: Normocephalic.     Nose: Nose normal.     Mouth/Throat:     Mouth: Mucous membranes are moist.     Pharynx: Oropharynx is clear.  Eyes:     Pupils: Pupils are equal, round, and reactive to light.  Cardiovascular:     Rate and Rhythm: Normal rate and regular rhythm.     Pulses: Normal pulses.     Heart sounds: No murmur heard. Pulmonary:     Effort: Pulmonary effort is normal. No respiratory distress.     Breath sounds: Normal breath sounds. No rales.  Abdominal:     General: Abdomen is flat. Bowel sounds are normal.     Palpations: Abdomen is soft.  Musculoskeletal:        General: No swelling.     Cervical back: Neck supple.  Skin:    General: Skin is warm.  Neurological:     Mental Status: He is alert and oriented to person, place, and time.  Psychiatric:        Mood and Affect: Mood normal.        Thought Content: Thought content normal.     Labs reviewed: Basic Metabolic Panel: Recent Labs    07/26/22 0000 01/10/23 1404 02/21/23 0000  NA 140  --  141  K 4.2  --  4.7  CL 104  --  104  CO2 26*  --  28*  BUN 12  --  11  CREATININE 0.7  --  0.6  CALCIUM 9.3  --  9.3  TSH 6.85* 8.400* 9.14*   Liver Function Tests: Recent Labs    07/26/22 0000  02/21/23 0000  AST 24 26  ALT 15 15  ALKPHOS 77 85  ALBUMIN 4.1 4.1   No results for input(s): "LIPASE", "AMYLASE" in the last 8760 hours. No results for input(s): "AMMONIA" in the last 8760 hours. CBC: Recent Labs    03/07/22 1106 07/26/22 0000 09/07/22 1047 01/05/23 1116 02/21/23 0000  WBC 6.0   < > 5.7 4.0 4.2  NEUTROABS 3.1  --  3.2 1.8  --   HGB 12.6*   < > 12.5* 13.0 12.6*  HCT 40.3   < > 38.7* 40.6 38*  MCV 95.0  --  95.1 95.8  --   PLT 134*   < > 107* 94* 112*   < > = values in this interval not displayed.   Lipid Panel: No results for input(s): "CHOL", "HDL", "LDLCALC", "TRIG", "CHOLHDL", "LDLDIRECT" in the last 8760 hours. Lab Results  Component Value Date   HGBA1C 5.2 04/10/2018    Procedures since last visit: No results found.  Assessment/Plan 1. Peripheral neuropathy, idiopathic/Alcoholic On Gabapentin   2. Thrombocytopenia (HCC) Follows with Hematology  3. Hypothyroidism, unspecified type TSH High  He is not very Compliant with his Synthyroid Went over instructions of how to take it empty Stomch Will change the dose to 50 mcg on Sundays 25 mcg rest of the day TSH in 8 weeks 4. NICM (nonischemic cardiomyopathy) (HCC) Aspirin and COreg  5. Mixed hyperlipidemia statin  6. Gastroesophageal reflux disease without esophagitis Prilosec  7. Primary insomnia Uses Tylenol PM Told to use only Tylenol  8. Pancreatic cyst No Folloow up needed  9. Flaccid neuropathic bladder, not elsewhere classified Uses In and Out cath  Trimpex 10. Alcohol drinking problem Recounseelled to cut back is Alcohol intake 11 Left Wrist pain Seen Ortho   Labs/tests ordered:  TSH in 8 weeks Next appt:  02/22/2023

## 2023-03-11 ENCOUNTER — Other Ambulatory Visit: Payer: Self-pay | Admitting: Adult Health

## 2023-03-13 NOTE — Telephone Encounter (Signed)
Patient is requesting a refill of the following medications: Requested Prescriptions   Pending Prescriptions Disp Refills   diazepam (VALIUM) 5 MG tablet [Pharmacy Med Name: DIAZEPAM 5 MG TABLET] 30 tablet 1    Sig: TAKE 0.5 TABLETS (2.5 MG TOTAL) BY MOUTH 2 (TWO) TIMES DAILY. 1/2 TABLET EVERY 2-3 DAYS    Date of last refill: 10/24/2022  Refill amount: 30/  Treatment agreement date: Not on file, notation made on 06/26/2023 appointment to sign.   PROVIDER WILL NEED TO REVIEW INSTRUCTIONS AS RX HAS 2 SETS OF INSTRUCTIONS

## 2023-03-14 ENCOUNTER — Other Ambulatory Visit: Payer: Self-pay | Admitting: Internal Medicine

## 2023-03-14 DIAGNOSIS — N139 Obstructive and reflux uropathy, unspecified: Secondary | ICD-10-CM | POA: Diagnosis not present

## 2023-03-14 DIAGNOSIS — R3914 Feeling of incomplete bladder emptying: Secondary | ICD-10-CM | POA: Diagnosis not present

## 2023-03-14 DIAGNOSIS — N312 Flaccid neuropathic bladder, not elsewhere classified: Secondary | ICD-10-CM | POA: Diagnosis not present

## 2023-04-05 ENCOUNTER — Other Ambulatory Visit: Payer: Self-pay | Admitting: Internal Medicine

## 2023-04-05 DIAGNOSIS — R3914 Feeling of incomplete bladder emptying: Secondary | ICD-10-CM | POA: Diagnosis not present

## 2023-04-05 DIAGNOSIS — I428 Other cardiomyopathies: Secondary | ICD-10-CM

## 2023-04-05 DIAGNOSIS — I2510 Atherosclerotic heart disease of native coronary artery without angina pectoris: Secondary | ICD-10-CM

## 2023-04-05 DIAGNOSIS — N312 Flaccid neuropathic bladder, not elsewhere classified: Secondary | ICD-10-CM | POA: Diagnosis not present

## 2023-04-05 DIAGNOSIS — N139 Obstructive and reflux uropathy, unspecified: Secondary | ICD-10-CM | POA: Diagnosis not present

## 2023-04-10 ENCOUNTER — Non-Acute Institutional Stay (INDEPENDENT_AMBULATORY_CARE_PROVIDER_SITE_OTHER): Payer: PPO | Admitting: Adult Health

## 2023-04-10 ENCOUNTER — Encounter: Payer: Self-pay | Admitting: Adult Health

## 2023-04-10 VITALS — BP 130/80 | HR 65 | Temp 98.1°F | Resp 16 | Ht 71.0 in | Wt 180.6 lb

## 2023-04-10 DIAGNOSIS — Z0000 Encounter for general adult medical examination without abnormal findings: Secondary | ICD-10-CM | POA: Diagnosis not present

## 2023-04-10 NOTE — Progress Notes (Signed)
Subjective:   David Gutierrez is a 89 y.o. male who presents for Medicare Annual/Subsequent preventive examination at [facility].   Review of Systems     Cardiac Risk Factors include: advanced age (>89men, >103 women)     Objective:    Today's Vitals   04/10/23 1430 04/10/23 1440  BP: 130/80   Pulse: 65   Resp: 16   Temp: 98.1 F (36.7 C)   TempSrc: Temporal   SpO2: 94%   Weight: 180 lb 9.6 oz (81.9 kg)   Height: 5\' 11"  (1.803 m)   PainSc:  10-Worst pain ever   Body mass index is 25.19 kg/m.     04/10/2023    2:37 PM 02/21/2023    1:02 PM 01/05/2023   11:52 AM 10/24/2022   12:48 PM 09/07/2022   11:48 AM 08/29/2022    1:25 PM 06/22/2022    8:16 AM  Advanced Directives  Does Patient Have a Medical Advance Directive? Yes Yes Yes Yes Yes Yes Yes  Type of Estate agent of [place];Living will;Out of facility DNR (pink MOST or yellow form) Healthcare Power of [place];Living will;Out of facility DNR (pink MOST or yellow form) Healthcare Power of [place];Living will Living will;Healthcare Power of [place];Out of facility DNR (pink MOST or yellow form) Healthcare Power of [place];Living will Healthcare Power of [place];Living will;Out of facility DNR (pink MOST or yellow form) Healthcare Power of [place];Living will  Does patient want to make changes to medical advance directive? No - Patient declined No - Patient declined No - Patient declined No - Guardian declined No - Patient declined  No - Patient declined  Copy of Healthcare Power of Attorney in Chart? No - copy requested No - copy requested No - copy requested No - copy requested No - copy requested  No - copy requested      Significant value    Current Medications (verified) Outpatient Encounter Medications as of 04/10/2023  Medication Sig   aspirin EC 81 MG tablet Take 81 mg by mouth daily.   AZO-CRANBERRY PO Take 1 tablet by mouth daily.   carvedilol (COREG) 6.25 MG tablet TAKE  1 TABLET BY MOUTH TWICE A DAY   Cyanocobalamin (VITAMIN B12) 1000 MCG TBCR Take 1,000 mcg by mouth.   diazepam (VALIUM) 5 MG tablet TAKE 0.5 TABLETS (2.5 MG TOTAL) BY MOUTH 2 (TWO) TIMES DAILY. 1/2 TABLET EVERY 2-3 DAYS   gabapentin (NEURONTIN) 300 MG capsule TAKE 1 CAPSULE EVERY DAY   levothyroxine (SYNTHROID) 25 MCG tablet Take 25 mcg by mouth. Every Morning except on SUN   levothyroxine (SYNTHROID) 50 MCG tablet Take 50 mcg by mouth. On Sunday   omeprazole (PRILOSEC) 20 MG capsule Take 1 capsule (20 mg total) by mouth daily.   polyethylene glycol (MIRALAX / GLYCOLAX) packet Take 17 g by mouth daily as needed for mild constipation.   Probiotic Product (PROBIOTIC PO) Take 1 capsule by mouth daily.   trimethoprim (TRIMPEX) 100 MG tablet Take 100 mg by mouth at bedtime.   Facility-Administered Encounter Medications as of 04/10/2023  Medication   0.9 %  sodium chloride infusion    Allergies (verified) Cyclobenzaprine, Lisinopril, Relafen [nabumetone], and Statins   History: Past Medical History:  Diagnosis Date   Alcoholic peripheral neuropathy [facility])    neurologist-  dr patel--- feet numbness and sensory ataxia   Arthritis    B12 deficiency    BPH (benign prostatic hyperplasia)    CAD (coronary artery disease) cardiolgoist-  dr  christopher end (previouly dr dalton Shirlee Latch)   a. Myoview 11/13:  EF 38%, inf and IS defect c/w scar but no ischemia:  b. Cardiac CT 11/13:  Ca score 318 Agatson units, pLAD and dCFX plaque;   c. LHC 11/07/12:  pLAD 30%, oD1 40%, oCFX 30%, dCFX 40-50%, mRCA 40%, EF 50% (frequent PVCs and short run of NSVT with injection/    per last echo 01/ 2017  ef 55-60%   Chronic constipation    Diverticulosis of colon    Esophageal reflux    External hemorrhoids    First degree heart block    Hiatal hernia    History of adenomatous polyp of colon    History of esophageal stricture 08/11/2015   s/p  dilatation   History of lower GI bleeding 03/01/2017   s/p  flexiable  simoidscopy w/ clipping rectum ulcer   Hypogonadism male    Hypothyroidism    Interstitial cystitis    Irritable bowel syndrome    LBBB (left bundle branch block)    NICM (nonischemic cardiomyopathy) [facility]) cardiologist-  dr Cristal Deer end (previously dr dalton Shirlee Latch)--  per last echo 01/ 2017  ef 55-60%   ? 2/2 LBBB => a. echo 11/13: diff HK, worse in septum and apex, mod LVE, mild LVH, EF 40%, mild AI, mild MR, mod LAE   Pre-diabetes    Self-catheterizes urinary bladder    bid to tid    Wears glasses    Wears hearing aid in both ears    Past Surgical History:  Procedure Laterality Date   CARDIAC CATHETERIZATION  11-07-2012    dr Shirlee Latch   nonobstructive CAD (pLAD 30%, ostial D1 40%, ostial LCx 30%, dLCx diffuse 40-50%, mRCA 40%) ;  LVSF 50% but diffiult due to PVCs and short run VT with injection;  cardiomyopathy mostly likely a LBBB cardiomyopathy   CARDIOVASCULAR STRESS TEST  10-16-2012   dr Shirlee Latch   Low risk adenosine nuclear study (no exercise) w/ a fixed inferior and inferoseptal defect without ischemia (question as whether this abnormality due to LBBB cardiomyopathy or due to scar)/  LV ef 38%,  LV wall motion decreased of the septum and entire apex   CATARACT EXTRACTION W/ INTRAOCULAR LENS  IMPLANT, BILATERAL  2012  approx.   COLONOSCOPY  2016   Dr. Marina Goodell, Per new patient form   CYSTO/  HYDRODISTENTION/  BLADDER BIOPSY  04-20-2005    dr Darvin Neighbours  [facility]   ETHMOIDECTOMY Right 12/12/2016   Procedure: RIGHT ENDOSCOPIC ETHMOIDECTOMY;  Surgeon: Newman Pies, MD;  Location: [facility];  Service: ENT;  Laterality: Right;   FLEXIBLE SIGMOIDOSCOPY N/A 03/02/2017   Procedure: FLEXIBLE SIGMOIDOSCOPY;  Surgeon: Meryl Dare, MD;  Location: [facility];  Service: Endoscopy;  Laterality: N/A;   LUMBAR LAMINECTOMY  1952   MAXILLARY ANTROSTOMY Right 12/12/2016   Procedure: RIGHT ENDOSCOPIC MAXILLARY ANTROSTOMY;  Surgeon: Newman Pies, MD;  Location: [facility];  Service:  ENT;  Laterality: Right;   SINUS ENDO W/FUSION Right 12/12/2016   Procedure: ENDOSCOPIC SINUS SURGERY WITH NAVIGATION;  Surgeon: Newman Pies, MD;  Location: [facility];  Service: ENT;  Laterality: Right;   SPHENOIDECTOMY Right 12/12/2016   Procedure: RIGHT ENDOSCOPIC SPHENOIDECTOMY;  Surgeon: Newman Pies, MD;  Location: [facility];  Service: ENT;  Laterality: Right;   TONSILLECTOMY AND ADENOIDECTOMY  child   TOTAL KNEE ARTHROPLASTY Left 10/14/2014   Procedure: LEFT TOTAL KNEE ARTHROPLASTY;  Surgeon: Shelda Pal, MD;  Location: [facility];  Service: Orthopedics;  Laterality: Left;   TOTAL KNEE ARTHROPLASTY Right 1990s   TRANSTHORACIC ECHOCARDIOGRAM  12-24-2015   dr Shirlee Latch   ef 55-60%,  grade 1 diastolic dysfunction/  trivial AR and TR  mild MR and PR/  moderate LAE   TRANSURETHRAL RESECTION OF PROSTATE  06-11-2010   dr Patsi Sears  [facility]   w/  GYRUS   TRANSURETHRAL RESECTION OF PROSTATE N/A 12/06/2017   Procedure: TRANSURETHRAL RESECTION OF THE PROSTATE (TURP)/ BIPOLAR;  Surgeon: Rene Paci, MD;  Location: [facility];  Service: Urology;  Laterality: N/A;   TRANSURETHRAL RESECTION OF PROSTATE N/A 04/11/2018   Procedure: TRANSURETHRAL RESECTION OF THE PROSTATE (TURP);  Surgeon: Rene Paci, MD;  Location: [facility];  Service: Urology;  Laterality: N/A;   UPPER GASTROINTESTINAL ENDOSCOPY     Family History  Problem Relation Age of Onset   Healthy Mother    Healthy Father    Other Sister    Healthy Sister    Thyroid cancer Brother    Heart disease Brother    Diabetes type I Son    Colon cancer Neg Hx    Esophageal cancer Neg Hx    Stomach cancer Neg Hx    Rectal cancer Neg Hx    Social History   Socioeconomic History   Marital status: Married    Spouse name: Not on file   Number of children: 3   Years of education: Not on file   Highest education level: Not on file  Occupational History   Occupation: retired  Tobacco  Use   Smoking status: Former    Packs/day: 2.00    Years: 15.00    Additional pack years: 0.00    Total pack years: 30.00    Types: Cigarettes    Quit date: 11/30/1967    Years since quitting: 55.3   Smokeless tobacco: Never  Vaping Use   Vaping Use: Never used  Substance and Sexual Activity   Alcohol use: Yes    Alcohol/week: 2.0 standard drinks of alcohol    Types: 2 Standard drinks or equivalent per week    Comment: occasional   Drug use: No   Sexual activity: Yes  Other Topics Concern   Not on file  Social History Narrative   Do you drink/eat things with caffeine? Very Little    Marital status/What year were you married? Married since 1961   Do you live in a house, apartment, assisted living, condo, trailer, etc.? Did not answer   Is it one or more stories? One   How many persons live in your home? 2    Do you have any pets in your home? No   Current or past profession: Brewing technologist.    Do you exercise? Very little    Type and how often? Did not answer    Do you have a living will? Yes   Do you have a DNR form? Did not answer   Do you have signed POA/HPOA forms? Did not answer   Social Determinants of Health   Financial Resource Strain: Not on file  Food Insecurity: Not on file  Transportation Needs: Not on file  Physical Activity: Not on file  Stress: Not on file  Social Connections: Not on file    Tobacco Counseling Counseling given: Not Answered   Clinical Intake:  Pre-visit preparation completed: Yes  Pain : 0-10 Pain Score: 10-Worst pain ever Pain Type: Chronic pain Pain Location: Wrist Pain Orientation:  Left Pain Descriptors / Indicators: Constant Pain Onset: Today Pain Frequency: Constant     BMI - recorded: 25.19 Nutritional Status: BMI 25 -29 Overweight Nutritional Risks: None Diabetes: No  How often do you need to have someone help you when you read instructions, pamphlets, or other written materials from your doctor or pharmacy?: 1 -  Never What is the last grade level you completed in school?: 4 years in college  Diabetic?No  Interpreter Needed?: No      Activities of Daily Living    04/10/2023    3:15 PM 04/10/2023    2:39 PM  In your present state of health, do you have any difficulty performing the following activities:  Hearing? 0 1  Comment  a little  Vision? 0 0  Difficulty concentrating or making decisions? 0 0  Walking or climbing stairs? 1 1  Dressing or bathing? 0 0  Doing errands, shopping? 1 0  Preparing Food and eating ? N   Using the Toilet? N   In the past six months, have you accidently leaked urine? N   Do you have problems with loss of bowel control? N   Managing your Medications? N   Managing your Finances? N   Housekeeping or managing your Housekeeping? N     Patient Care Team: Mahlon Gammon, MD as PCP - General (Internal Medicine) Pricilla Riffle, MD as PCP - Cardiology (Cardiology) Glendale Chard, DO as Consulting Physician (Neurology) [place]ale Chard, DO as Consulting Physician (Neurology) Rene Paci, MD as Consulting Physician (Urology) Donzetta Starch, MD as Consulting Physician (Dermatology)  Indicate any recent Medical Services you may have received from other than [facility] providers in the past year (date may be approximate).     Assessment:   This is a routine wellness examination for David Gutierrez.  Hearing/Vision screen Hearing Screening - Comments:: No hearing in the last 12 months Vision Screening - Comments:: Patient states he has to find a new eye doctor he is due in june  Dietary issues and exercise activities discussed: Current Exercise Habits: The patient does not participate in regular exercise at present, Exercise limited by: None identified   Goals Addressed             This Visit's Progress    Maintain Mobility and Function       Evidence-based guidance:  Emphasize the importance of physical activity and aerobic exercise as included in treatment  plan; assess barriers to adherence; consider patient's abilities and preferences.  Encourage gradual increase in activity or exercise instead of stopping if pain occurs.  Reinforce individual therapy exercise prescription, such as strengthening, stabilization and stretching programs.  Promote optimal body mechanics to stabilize the spine with lifting and functional activity.  Encourage activity and mobility modifications to facilitate optimal function, such as using a log roll for bed mobility or dressing from a seated position.  Reinforce individual adaptive equipment recommendations to limit excessive spinal movements, such as a Event organiser.  Assess adequacy of sleep; encourage use of sleep hygiene techniques, such as bedtime routine; use of white noise; dark, cool bedroom; avoiding daytime naps, heavy meals or exercise before bedtime.  Promote positions and modification to optimize sleep and sexual activity; consider pillows or positioning devices to assist in maintaining neutral spine.  Explore options for applying ergonomic principles at work and home, such as frequent position changes, using ergonomically designed equipment and working at optimal height.  Promote modifications to increase comfort with driving such as  lumbar support, optimizing seat and steering wheel position, using cruise control and taking frequent rest stops to stretch and walk.   Notes:        Depression Screen    04/10/2023    2:36 PM 10/24/2022   12:48 PM  PHQ 2/9 Scores  PHQ - 2 Score 0 0  PHQ- 9 Score  0    Fall Risk    04/10/2023    2:36 PM 02/21/2023    1:02 PM 10/24/2022   12:47 PM 08/29/2022    1:24 PM 02/16/2022    9:03 AM  Fall Risk   Falls in the past year? 0 0 0 0 0  Number falls in past yr: 0 0 0 0 0  Injury with Fall? 0 0 0 0 0  Risk for fall due to : No Fall Risks Impaired balance/gait;Impaired mobility Impaired mobility  Impaired balance/gait  Risk for fall due to: Comment   patient  has cane    Follow up Falls evaluation completed Falls evaluation completed Falls evaluation completed  Falls evaluation completed    FALL RISK PREVENTION PERTAINING TO THE HOME:  Any stairs in or around the home? No  If so, are there any without handrails? No  Home free of loose throw rugs in walkways, pet beds, electrical cords, etc? Yes  Adequate lighting in your home to reduce risk of falls? Yes   ASSISTIVE DEVICES UTILIZED TO PREVENT FALLS:  Life alert? No  Use of a cane, walker or w/c? Yes  Grab bars in the bathroom? Yes  Shower chair or bench in shower? Yes  Elevated toilet seat or a handicapped toilet? No   TIMED UP AND GO:  Was the test performed? No .  Length of time to ambulate 10 feet: refused due to wrist pain sec.   Gait slow and steady with assistive device  Cognitive Function:    04/10/2023    2:48 PM  MMSE - Mini Mental State Exam  Orientation to time 5  Orientation to Place 5  Registration 3  Attention/ Calculation 5  Recall 1  Language- name 2 objects 2  Language- repeat 1  Language- follow 3 step command 3  Language- read & follow direction 1  Write a sentence 1  Copy design 1  Total score 28        Immunizations Immunization History  Administered Date(s) Administered   Dtap, Unspecified 07/17/2017   Influenza Split 09/14/2010, 09/28/2011, 08/24/2012, 09/12/2013, 09/01/2014   Influenza, High Dose Seasonal PF 08/27/2018, 07/29/2019, 09/25/2021   Influenza,inj,Quad PF,6+ Mos 09/13/2013, 09/01/2014, 08/06/2015   Influenza-Unspecified 08/20/2016, 08/08/2022   Moderna SARS-COV2 Booster Vaccination 10/20/2020   Moderna Sars-Covid-2 Vaccination 12/17/2019, 01/14/2020   Pneumococcal Conjugate-13 11/11/2013   Pneumococcal Polysaccharide-23 11/23/2007   Td 07/17/2017   Tdap 11/23/2007   Tetanus 12/05/2017   Unspecified SARS-COV-2 Vaccination 12/17/2019, 01/14/2020   Zoster Recombinat (Shingrix) 09/05/2017, 12/04/2017   Zoster, Live 09/09/2007,  09/05/2017, 12/04/2017    TDAP status: Up to date  Flu Vaccine status: Up to date  Pneumococcal vaccine status: Up to date  Covid-19 vaccine status: Declined, Education has been provided regarding the importance of this vaccine but patient still declined. Advised may receive this vaccine at local pharmacy or Health Dept.or vaccine clinic. Aware to provide a copy of the vaccination record if obtained from local pharmacy or Health Dept. Verbalized acceptance and understanding.  Qualifies for Shingles Vaccine? Yes   Zostavax completed Yes   Shingrix Completed?: Yes  Screening Tests Health  Maintenance  Topic Date Due   Medicare Annual Wellness (AWV)  03/23/2022   COVID-19 Vaccine (6 - 2023-24 season) 04/26/2023 (Originally 08/05/2022)   INFLUENZA VACCINE  07/06/2023   DTaP/Tdap/Td (5 - Td or Tdap) 12/06/2027   Pneumonia Vaccine 30+ Years old  Completed   Zoster Vaccines- Shingrix  Completed   HPV VACCINES  Aged Out   COLONOSCOPY (Pts 45-64yrs Insurance coverage will need to be confirmed)  Discontinued    Health Maintenance  Health Maintenance Due  Topic Date Due   Medicare Annual Wellness (AWV)  03/23/2022    Colorectal cancer screening: No longer required.   Lung Cancer Screening: (Low Dose CT Chest recommended if Age 16-80 years, 30 pack-year currently smoking OR have quit w/in 15years.) does not qualify.   Lung Cancer Screening Referral: na  Additional Screening:  Hepatitis C Screening: does not qualify; Completed na  Vision Screening: Recommended annual ophthalmology exams for early detection of glaucoma and other disorders of the eye. Is the patient up to date with their annual eye exam?  Yes  Who is the provider or what is the name of the office in which the patient attends annual eye exams? na If pt is not established with a provider, would they like to be referred to a provider to establish care? Yes .   Dental Screening: Recommended annual dental exams for proper  oral hygiene  Community Resource Referral / Chronic Care Management: CRR required this visit?  Yes   CCM required this visit?  Yes      Plan:     I have personally reviewed and noted the following in the patient's chart:   Medical and social history Use of alcohol, tobacco or illicit drugs  Current medications and supplements including opioid prescriptions. Patient is not currently taking opioid prescriptions. Functional ability and status Nutritional status Physical activity Advanced directives List of other physicians Hospitalizations, surgeries, and ER visits in previous 12 months Vitals Screenings to include cognitive, depression, and falls Referrals and appointments  In addition, I have reviewed and discussed with patient certain preventive protocols, quality metrics, and best practice recommendations. A written personalized care plan for preventive services as well as general preventive health recommendations were provided to patient.     Fletcher Anon, NP   04/10/2023   Nurse Notes:  has left hand swelling from degenerative changes and chondrocalcinosis. Recommend f/u with ortho for wrist injection. If they are not available call me.

## 2023-04-10 NOTE — Patient Instructions (Signed)
David Gutierrez , Thank you for taking time to come for your Medicare Wellness Visit. I appreciate your ongoing commitment to your health goals. Please review the following plan we discussed and let me know if I can assist you in the future.   Screening recommendations/referrals: Colonoscopy aged out Recommended yearly ophthalmology/optometry visit for glaucoma screening and checkup Recommended yearly dental visit for hygiene and checkup  Vaccinations: Influenza vaccine due annually in September/October Pneumococcal vaccine up to date Tdap vaccine up to date Shingles vaccine up to date    Advanced directives: reviewed   Conditions/risks identified: fall   Next appointment: 1 year  Preventive Care 87 Years and Older, Male Preventive care refers to lifestyle choices and visits with your health care provider that can promote health and wellness. What does preventive care include? A yearly physical exam. This is also called an annual well check. Dental exams once or twice a year. Routine eye exams. Ask your health care provider how often you should have your eyes checked. Personal lifestyle choices, including: Daily care of your teeth and gums. Regular physical activity. Eating a healthy diet. Avoiding tobacco and drug use. Limiting alcohol use. Practicing safe sex. Taking low doses of aspirin every day. Taking vitamin and mineral supplements as recommended by your health care provider. What happens during an annual well check? The services and screenings done by your health care provider during your annual well check will depend on your age, overall health, lifestyle risk factors, and family history of disease. Counseling  Your health care provider may ask you questions about your: Alcohol use. Tobacco use. Drug use. Emotional well-being. Home and relationship well-being. Sexual activity. Eating habits. History of falls. Memory and ability to understand (cognition). Work and  work Astronomer. Screening  You may have the following tests or measurements: Height, weight, and BMI. Blood pressure. Lipid and cholesterol levels. These may be checked every 5 years, or more frequently if you are over 54 years old. Skin check. Lung cancer screening. You may have this screening every year starting at age 87 if you have a 30-pack-year history of smoking and currently smoke or have quit within the past 15 years. Fecal occult blood test (FOBT) of the stool. You may have this test every year starting at age 87. Flexible sigmoidoscopy or colonoscopy. You may have a sigmoidoscopy every 5 years or a colonoscopy every 10 years starting at age 47. Prostate cancer screening. Recommendations will vary depending on your family history and other risks. Hepatitis C blood test. Hepatitis B blood test. Sexually transmitted disease (STD) testing. Diabetes screening. This is done by checking your blood sugar (glucose) after you have not eaten for a while (fasting). You may have this done every 1-3 years. Abdominal aortic aneurysm (AAA) screening. You may need this if you are a current or former smoker. Osteoporosis. You may be screened starting at age 40 if you are at high risk. Talk with your health care provider about your test results, treatment options, and if necessary, the need for more tests. Vaccines  Your health care provider may recommend certain vaccines, such as: Influenza vaccine. This is recommended every year. Tetanus, diphtheria, and acellular pertussis (Tdap, Td) vaccine. You may need a Td booster every 10 years. Zoster vaccine. You may need this after age 61. Pneumococcal 13-valent conjugate (PCV13) vaccine. One dose is recommended after age 22. Pneumococcal polysaccharide (PPSV23) vaccine. One dose is recommended after age 23. Talk to your health care provider about which screenings and vaccines  you need and how often you need them. This information is not intended to  replace advice given to you by your health care provider. Make sure you discuss any questions you have with your health care provider. Document Released: 12/18/2015 Document Revised: 08/10/2016 Document Reviewed: 09/22/2015 Elsevier Interactive Patient Education  2017 [employer] Prevention in the Home Falls can cause injuries. They can happen to people of all ages. There are many things you can do to make your home safe and to help prevent falls. What can I do on the outside of my home? Regularly fix the edges of walkways and driveways and fix any cracks. Remove anything that might make you trip as you walk through a door, such as a raised step or threshold. Trim any bushes or trees on the path to your home. Use bright outdoor lighting. Clear any walking paths of anything that might make someone trip, such as rocks or tools. Regularly check to see if handrails are loose or broken. Make sure that both sides of any steps have handrails. Any raised decks and porches should have guardrails on the edges. Have any leaves, snow, or ice cleared regularly. Use sand or salt on walking paths during winter. Clean up any spills in your garage right away. This includes oil or grease spills. What can I do in the bathroom? Use night lights. Install grab bars by the toilet and in the tub and shower. Do not use towel bars as grab bars. Use non-skid mats or decals in the tub or shower. If you need to sit down in the shower, use a plastic, non-slip stool. Keep the floor dry. Clean up any water that spills on the floor as soon as it happens. Remove soap buildup in the tub or shower regularly. Attach bath mats securely with double-sided non-slip rug tape. Do not have throw rugs and other things on the floor that can make you trip. What can I do in the bedroom? Use night lights. Make sure that you have a light by your bed that is easy to reach. Do not use any sheets or blankets that are too big for  your bed. They should not hang down onto the floor. Have a firm chair that has side arms. You can use this for support while you get dressed. Do not have throw rugs and other things on the floor that can make you trip. What can I do in the kitchen? Clean up any spills right away. Avoid walking on wet floors. Keep items that you use a lot in easy-to-reach places. If you need to reach something above you, use a strong step stool that has a grab bar. Keep electrical cords out of the way. Do not use floor polish or wax that makes floors slippery. If you must use wax, use non-skid floor wax. Do not have throw rugs and other things on the floor that can make you trip. What can I do with my stairs? Do not leave any items on the stairs. Make sure that there are handrails on both sides of the stairs and use them. Fix handrails that are broken or loose. Make sure that handrails are as long as the stairways. Check any carpeting to make sure that it is firmly attached to the stairs. Fix any carpet that is loose or worn. Avoid having throw rugs at the top or bottom of the stairs. If you do have throw rugs, attach them to the floor with carpet tape. Make sure  that you have a light switch at the top of the stairs and the bottom of the stairs. If you do not have them, ask someone to add them for you. What else can I do to help prevent falls? Wear shoes that: Do not have high heels. Have rubber bottoms. Are comfortable and fit you well. Are closed at the toe. Do not wear sandals. If you use a stepladder: Make sure that it is fully opened. Do not climb a closed stepladder. Make sure that both sides of the stepladder are locked into place. Ask someone to hold it for you, if possible. Clearly mark and make sure that you can see: Any grab bars or handrails. First and last steps. Where the edge of each step is. Use tools that help you move around (mobility aids) if they are needed. These  include: Canes. Walkers. Scooters. Crutches. Turn on the lights when you go into a dark area. Replace any light bulbs as soon as they burn out. Set up your furniture so you have a clear path. Avoid moving your furniture around. If any of your floors are uneven, fix them. If there are any pets around you, be aware of where they are. Review your medicines with your doctor. Some medicines can make you feel dizzy. This can increase your chance of falling. Ask your doctor what other things that you can do to help prevent falls. This information is not intended to replace advice given to you by your health care provider. Make sure you discuss any questions you have with your health care provider. Document Released: 09/17/2009 Document Revised: 04/28/2016 Document Reviewed: 12/26/2014 Elsevier Interactive Patient Education  2017 [employer].

## 2023-04-11 ENCOUNTER — Encounter: Payer: Self-pay | Admitting: Internal Medicine

## 2023-04-11 ENCOUNTER — Non-Acute Institutional Stay: Payer: PPO | Admitting: Internal Medicine

## 2023-04-11 VITALS — BP 130/80 | HR 80 | Temp 98.1°F | Resp 17 | Ht 71.0 in | Wt 179.8 lb

## 2023-04-11 DIAGNOSIS — M1990 Unspecified osteoarthritis, unspecified site: Secondary | ICD-10-CM

## 2023-04-11 MED ORDER — PREDNISONE 20 MG PO TABS: 20.0000 mg | ORAL_TABLET | Freq: Every day | ORAL | 0 refills | Status: AC

## 2023-04-11 NOTE — Patient Instructions (Signed)
Can take Aleve one tablet qd with Food.for few days

## 2023-04-11 NOTE — Progress Notes (Signed)
Location: [facility] features editor of Service:  Clinic (12)  Provider:   Code Status:  Goals of Care:     04/11/2023    2:54 PM  Advanced Directives  Does Patient Have a Medical Advance Directive? Yes  Type of Estate agent of [place];Living will;Out of facility DNR (pink MOST or yellow form)  Does patient want to make changes to medical advance directive? No - Patient declined  Copy of Healthcare Power of Attorney in Chart? No - copy requested     Chief Complaint  Patient presents with   Acute Visit    Patient is being seen for some acute left wrist and arm pain. Patient states he can  not make a fist and hard to grab anything     HPI: Patient is a 89 y.o. male seen today for an acute visit for Left Wrist Pain and swelling  [place] the nurse this morning About the swelling and severe pain in his left wrist.   He said that he has a history of this off and on for past few years.  Swelling will come and then after couple days will resolve itself.  He was seen by Ortho in 10/23 X-ray at that time showed chondrocalcinosis in multiple joints. No Intervention was done Was told to come if swelling comes back He has appointment to see them in 2 weeks But pain is so bad he wanted to be seen today by [facility]  This time the swelling has gotten worse.  Patient has also been doing a lot of yard work denies any history of fall.  Past Medical History:  Diagnosis Date   Alcoholic peripheral neuropathy [facility])    neurologist-  dr patel--- feet numbness and sensory ataxia   Arthritis    B12 deficiency    BPH (benign prostatic hyperplasia)    CAD (coronary artery disease) cardiolgoist-  dr Cristal Deer end (previouly dr dalton Shirlee Latch)   a. Myoview 11/13:  EF 38%, inf and IS defect c/w scar but no ischemia:  b. Cardiac CT 11/13:  Ca score 318 Agatson units, pLAD and dCFX plaque;   c. LHC 11/07/12:  pLAD 30%, oD1 40%, oCFX 30%, dCFX 40-50%, mRCA  40%, EF 50% (frequent PVCs and short run of NSVT with injection/    per last echo 01/ 2017  ef 55-60%   Chronic constipation    Diverticulosis of colon    Esophageal reflux    External hemorrhoids    First degree heart block    Hiatal hernia    History of adenomatous polyp of colon    History of esophageal stricture 08/11/2015   s/p  dilatation   History of lower GI bleeding 03/01/2017   s/p  flexiable simoidscopy w/ clipping rectum ulcer   Hypogonadism male    Hypothyroidism    Interstitial cystitis    Irritable bowel syndrome    LBBB (left bundle branch block)    NICM (nonischemic cardiomyopathy) [facility]) cardiologist-  dr Cristal Deer end (previously dr dalton Shirlee Latch)--  per last echo 01/ 2017  ef 55-60%   ? 2/2 LBBB => a. echo 11/13: diff HK, worse in septum and apex, mod LVE, mild LVH, EF 40%, mild AI, mild MR, mod LAE   Pre-diabetes    Self-catheterizes urinary bladder    bid to tid    Wears glasses    Wears hearing aid in both ears     Past Surgical History:  Procedure  Laterality Date   CARDIAC CATHETERIZATION  11-07-2012    dr Shirlee Latch   nonobstructive CAD (pLAD 30%, ostial D1 40%, ostial LCx 30%, dLCx diffuse 40-50%, mRCA 40%) ;  LVSF 50% but diffiult due to PVCs and short run VT with injection;  cardiomyopathy mostly likely a LBBB cardiomyopathy   CARDIOVASCULAR STRESS TEST  10-16-2012   dr Shirlee Latch   Low risk adenosine nuclear study (no exercise) w/ a fixed inferior and inferoseptal defect without ischemia (question as whether this abnormality due to LBBB cardiomyopathy or due to scar)/  LV ef 38%,  LV wall motion decreased of the septum and entire apex   CATARACT EXTRACTION W/ INTRAOCULAR LENS  IMPLANT, BILATERAL  2012  approx.   COLONOSCOPY  2016   Dr. Marina Goodell, Per new patient form   CYSTO/  HYDRODISTENTION/  BLADDER BIOPSY  04-20-2005    dr Darvin Neighbours  [facility]   ETHMOIDECTOMY Right 12/12/2016   Procedure: RIGHT ENDOSCOPIC ETHMOIDECTOMY;  Surgeon: Newman Pies, MD;  Location: [facility];  Service: ENT;  Laterality: Right;   FLEXIBLE SIGMOIDOSCOPY N/A 03/02/2017   Procedure: FLEXIBLE SIGMOIDOSCOPY;  Surgeon: Meryl Dare, MD;  Location: [facility];  Service: Endoscopy;  Laterality: N/A;   LUMBAR LAMINECTOMY  1952   MAXILLARY ANTROSTOMY Right 12/12/2016   Procedure: RIGHT ENDOSCOPIC MAXILLARY ANTROSTOMY;  Surgeon: Newman Pies, MD;  Location: [facility];  Service: ENT;  Laterality: Right;   SINUS ENDO W/FUSION Right 12/12/2016   Procedure: ENDOSCOPIC SINUS SURGERY WITH NAVIGATION;  Surgeon: Newman Pies, MD;  Location: [facility];  Service: ENT;  Laterality: Right;   SPHENOIDECTOMY Right 12/12/2016   Procedure: RIGHT ENDOSCOPIC SPHENOIDECTOMY;  Surgeon: Newman Pies, MD;  Location:  SURGERY CENTER;  Service: ENT;  Laterality: Right;   TONSILLECTOMY AND ADENOIDECTOMY  child   TOTAL KNEE ARTHROPLASTY Left 10/14/2014   Procedure: LEFT TOTAL KNEE ARTHROPLASTY;  Surgeon: Shelda Pal, MD;  Location: [facility];  Service: Orthopedics;  Laterality: Left;   TOTAL KNEE ARTHROPLASTY Right 1990s   TRANSTHORACIC ECHOCARDIOGRAM  12-24-2015   dr Shirlee Latch   ef 55-60%,  grade 1 diastolic dysfunction/  trivial AR and TR  mild MR and PR/  moderate LAE   TRANSURETHRAL RESECTION OF PROSTATE  06-11-2010   dr Patsi Sears  [facility]   w/  GYRUS   TRANSURETHRAL RESECTION OF PROSTATE N/A 12/06/2017   Procedure: TRANSURETHRAL RESECTION OF THE PROSTATE (TURP)/ BIPOLAR;  Surgeon: Rene Paci, MD;  Location: [facility];  Service: Urology;  Laterality: N/A;   TRANSURETHRAL RESECTION OF PROSTATE N/A 04/11/2018   Procedure: TRANSURETHRAL RESECTION OF THE PROSTATE (TURP);  Surgeon: Rene Paci, MD;  Location: [facility];  Service: Urology;  Laterality: N/A;   UPPER GASTROINTESTINAL ENDOSCOPY      Allergies  Allergen Reactions   Cyclobenzaprine Other (See Comments)    Lost balance   Lisinopril Other (See Comments)    Affected  ability to urinate   Relafen [Nabumetone] Other (See Comments)    Problems urinating afterwards    Statins Other (See Comments)    "my peeing"    Outpatient Encounter Medications as of 04/11/2023  Medication Sig   aspirin EC 81 MG tablet Take 81 mg by mouth daily.   AZO-CRANBERRY PO Take 1 tablet by mouth daily.   carvedilol (COREG) 6.25 MG tablet TAKE 1 TABLET BY MOUTH TWICE A DAY   Cyanocobalamin (VITAMIN B12) 1000 MCG TBCR Take 1,000 mcg by mouth.   diazepam (  VALIUM) 5 MG tablet TAKE 0.5 TABLETS (2.5 MG TOTAL) BY MOUTH 2 (TWO) TIMES DAILY. 1/2 TABLET EVERY 2-3 DAYS   gabapentin (NEURONTIN) 300 MG capsule TAKE 1 CAPSULE EVERY DAY   levothyroxine (SYNTHROID) 25 MCG tablet Take 25 mcg by mouth. Every Morning except on SUN   levothyroxine (SYNTHROID) 50 MCG tablet Take 50 mcg by mouth. On Sunday   omeprazole (PRILOSEC) 20 MG capsule Take 1 capsule (20 mg total) by mouth daily.   polyethylene glycol (MIRALAX / GLYCOLAX) packet Take 17 g by mouth daily as needed for mild constipation.   predniSONE (DELTASONE) 20 MG tablet Take 1 tablet (20 mg total) by mouth daily with breakfast for 10 days.   Probiotic Product (PROBIOTIC PO) Take 1 capsule by mouth daily.   trimethoprim (TRIMPEX) 100 MG tablet Take 100 mg by mouth at bedtime.   Facility-Administered Encounter Medications as of 04/11/2023  Medication   0.9 %  sodium chloride infusion    Review of Systems:  Review of Systems  Constitutional:  Negative for activity change, appetite change and unexpected weight change.  HENT: Negative.    Respiratory:  Negative for cough and shortness of breath.   Cardiovascular:  Negative for leg swelling.  Gastrointestinal:  Negative for constipation.  Genitourinary:  Negative for frequency.  Musculoskeletal:  Positive for gait problem and joint swelling. Negative for arthralgias and myalgias.  Skin: Negative.  Negative for rash.  Neurological:  Negative for dizziness and weakness.   Psychiatric/Behavioral:  Negative for confusion and sleep disturbance.   All other systems reviewed and are negative.   Health Maintenance  Topic Date Due   COVID-19 Vaccine (6 - 2023-24 season) 04/26/2023 (Originally 08/05/2022)   INFLUENZA VACCINE  07/06/2023   Medicare Annual Wellness (AWV)  04/09/2024   DTaP/Tdap/Td (5 - Td or Tdap) 12/06/2027   Pneumonia Vaccine 59+ Years old  Completed   Zoster Vaccines- Shingrix  Completed   HPV VACCINES  Aged Out   COLONOSCOPY (Pts 45-44yrs Insurance coverage will need to be confirmed)  Discontinued    Physical Exam: Vitals:   04/11/23 1451  BP: 130/80  Pulse: 80  Resp: 17  Temp: 98.1 F (36.7 C)  TempSrc: Temporal  SpO2: 95%  Weight: 179 lb 12.8 oz (81.6 kg)  Height: 5\' 11"  (1.803 m)   Body mass index is 25.08 kg/m. Physical Exam Vitals reviewed.  Constitutional:      Appearance: Normal appearance.  HENT:     Head: Normocephalic.     Nose: Nose normal.     Mouth/Throat:     Mouth: Mucous membranes are moist.     Pharynx: Oropharynx is clear.  Eyes:     Pupils: Pupils are equal, round, and reactive to light.  Cardiovascular:     Rate and Rhythm: Normal rate and regular rhythm.     Pulses: Normal pulses.  Abdominal:     General: Abdomen is flat. Bowel sounds are normal.     Palpations: Abdomen is soft.  Musculoskeletal:        General: No swelling.     Cervical back: Neck supple.     Comments: Left Wrist is swollen And tender and warm Not red  Skin:    General: Skin is warm.  Neurological:     General: No focal deficit present.     Mental Status: He is alert and oriented to person, place, and time.  Psychiatric:        Mood and Affect: Mood normal.  Thought Content: Thought content normal.     Labs reviewed: Basic Metabolic Panel: Recent Labs    07/26/22 0000 01/10/23 1404 02/21/23 0000  NA 140  --  141  K 4.2  --  4.7  CL 104  --  104  CO2 26*  --  28*  BUN 12  --  11  CREATININE 0.7  --  0.6   CALCIUM 9.3  --  9.3  TSH 6.85* 8.400* 9.14*   Liver Function Tests: Recent Labs    07/26/22 0000 02/21/23 0000  AST 24 26  ALT 15 15  ALKPHOS 77 85  ALBUMIN 4.1 4.1   No results for input(s): "LIPASE", "AMYLASE" in the last 8760 hours. No results for input(s): "AMMONIA" in the last 8760 hours. CBC: Recent Labs    09/07/22 1047 01/05/23 1116 02/21/23 0000  WBC 5.7 4.0 4.2  NEUTROABS 3.2 1.8  --   HGB 12.5* 13.0 12.6*  HCT 38.7* 40.6 38*  MCV 95.1 95.8  --   PLT 107* 94* 112*   Lipid Panel: No results for input(s): "CHOL", "HDL", "LDLCALC", "TRIG", "CHOLHDL", "LDLDIRECT" in the last 8760 hours. Lab Results  Component Value Date   HGBA1C 5.2 04/10/2018    Procedures since last visit: No results found.  Assessment/Plan 1. Inflammatory arthritis ? Etiology Seems most likley Pseudogout Prednisone 20 mg QD  Also check ESR,CRP, Uric acid and RF factor Can take Alleve one tablet QD with meals If pain not better call our office Has appointment to see Ortho    Labs/tests ordered:  ESR CRP Uric acid RF Next appt:  05/08/2023

## 2023-04-13 DIAGNOSIS — I2510 Atherosclerotic heart disease of native coronary artery without angina pectoris: Secondary | ICD-10-CM | POA: Diagnosis not present

## 2023-04-13 DIAGNOSIS — M064 Inflammatory polyarthropathy: Secondary | ICD-10-CM | POA: Diagnosis not present

## 2023-04-24 DIAGNOSIS — N3020 Other chronic cystitis without hematuria: Secondary | ICD-10-CM | POA: Diagnosis not present

## 2023-04-24 DIAGNOSIS — N312 Flaccid neuropathic bladder, not elsewhere classified: Secondary | ICD-10-CM | POA: Diagnosis not present

## 2023-04-25 DIAGNOSIS — E039 Hypothyroidism, unspecified: Secondary | ICD-10-CM | POA: Diagnosis not present

## 2023-04-25 LAB — TSH: TSH: 6.87 — AB (ref 0.41–5.90)

## 2023-04-26 ENCOUNTER — Ambulatory Visit (INDEPENDENT_AMBULATORY_CARE_PROVIDER_SITE_OTHER): Payer: PPO | Admitting: Orthopaedic Surgery

## 2023-04-26 DIAGNOSIS — M25532 Pain in left wrist: Secondary | ICD-10-CM | POA: Diagnosis not present

## 2023-04-26 NOTE — Progress Notes (Signed)
Chief Complaint: Left wrist pain     History of Present Illness:   04/26/2023: Presents today for follow-up of the lacerations as this did recently swell significantly.  He is hoping to pursue an injection today  David Gutierrez is a 89 y.o. male presents today with 3 to 5 years of ongoing left wrist pain that is worse at the end of the day.  He states that 3 years ago he had to stop playing golf as a result of this.  He states the end of a long day he feels weakness with grip and pain predominantly involving the distal aspect of the wrist.  He has not been taking any anti-inflammatories for this.  He is currently seeing Dr. Alcide Evener for ongoing management of thrombocytopenia.  This has been chronic in nature.     Surgical History:   None  PMH/PSH/Family History/Social History/Meds/Allergies:    Past Medical History:  Diagnosis Date  . Alcoholic peripheral neuropathy [facility])    neurologist-  dr patel--- feet numbness and sensory ataxia  . Arthritis   . B12 deficiency   . BPH (benign prostatic hyperplasia)   . CAD (coronary artery disease) cardiolgoist-  dr Cristal Deer end (previouly dr dalton Shirlee Latch)   a. Myoview 11/13:  EF 38%, inf and IS defect c/w scar but no ischemia:  b. Cardiac CT 11/13:  Ca score 318 Agatson units, pLAD and dCFX plaque;   c. LHC 11/07/12:  pLAD 30%, oD1 40%, oCFX 30%, dCFX 40-50%, mRCA 40%, EF 50% (frequent PVCs and short run of NSVT with injection/    per last echo 01/ 2017  ef 55-60%  . Chronic constipation   . Diverticulosis of colon   . Esophageal reflux   . External hemorrhoids   . First degree heart block   . Hiatal hernia   . History of adenomatous polyp of colon   . History of esophageal stricture 08/11/2015   s/p  dilatation  . History of lower GI bleeding 03/01/2017   s/p  flexiable simoidscopy w/ clipping rectum ulcer  . Hypogonadism male   . Hypothyroidism   . Interstitial cystitis   . Irritable bowel syndrome    . LBBB (left bundle branch block)   . NICM (nonischemic cardiomyopathy) [facility]) cardiologist-  dr Cristal Deer end (previously dr dalton Shirlee Latch)--  per last echo 01/ 2017  ef 55-60%   ? 2/2 LBBB => a. echo 11/13: diff HK, worse in septum and apex, mod LVE, mild LVH, EF 40%, mild AI, mild MR, mod LAE  . Pre-diabetes   . Self-catheterizes urinary bladder    bid to tid   . Wears glasses   . Wears hearing aid in both ears    Past Surgical History:  Procedure Laterality Date  . CARDIAC CATHETERIZATION  11-07-2012    dr Shirlee Latch   nonobstructive CAD (pLAD 30%, ostial D1 40%, ostial LCx 30%, dLCx diffuse 40-50%, mRCA 40%) ;  LVSF 50% but diffiult due to PVCs and short run VT with injection;  cardiomyopathy mostly likely a LBBB cardiomyopathy  . CARDIOVASCULAR STRESS TEST  10-16-2012   dr Shirlee Latch   Low risk adenosine nuclear study (no exercise) w/ a fixed inferior and inferoseptal defect without ischemia (question as whether this abnormality due to LBBB cardiomyopathy or due to scar)/  LV ef 38%,  LV wall motion decreased of the septum and entire apex  . CATARACT EXTRACTION W/ INTRAOCULAR LENS  IMPLANT, BILATERAL  2012  approx.  . COLONOSCOPY  2016   Dr. Marina Goodell, Per new patient form  . CYSTO/  HYDRODISTENTION/  BLADDER BIOPSY  04-20-2005    dr Darvin Neighbours  [facility]  . ETHMOIDECTOMY Right 12/12/2016   Procedure: RIGHT ENDOSCOPIC ETHMOIDECTOMY;  Surgeon: Newman Pies, MD;  Location: [facility];  Service: ENT;  Laterality: Right;  . FLEXIBLE SIGMOIDOSCOPY N/A 03/02/2017   Procedure: FLEXIBLE SIGMOIDOSCOPY;  Surgeon: Meryl Dare, MD;  Location: [facility];  Service: Endoscopy;  Laterality: N/A;  . LUMBAR LAMINECTOMY  1952  . MAXILLARY ANTROSTOMY Right 12/12/2016   Procedure: RIGHT ENDOSCOPIC MAXILLARY ANTROSTOMY;  Surgeon: Newman Pies, MD;  Location: [facility];  Service: ENT;  Laterality: Right;  . SINUS ENDO W/FUSION Right 12/12/2016   Procedure: ENDOSCOPIC SINUS SURGERY WITH  NAVIGATION;  Surgeon: Newman Pies, MD;  Location: [facility];  Service: ENT;  Laterality: Right;  . SPHENOIDECTOMY Right 12/12/2016   Procedure: RIGHT ENDOSCOPIC SPHENOIDECTOMY;  Surgeon: Newman Pies, MD;  Location: [facility];  Service: ENT;  Laterality: Right;  . TONSILLECTOMY AND ADENOIDECTOMY  child  . TOTAL KNEE ARTHROPLASTY Left 10/14/2014   Procedure: LEFT TOTAL KNEE ARTHROPLASTY;  Surgeon: Shelda Pal, MD;  Location: [facility];  Service: Orthopedics;  Laterality: Left;  . TOTAL KNEE ARTHROPLASTY Right 1990s  . TRANSTHORACIC ECHOCARDIOGRAM  12-24-2015   dr Shirlee Latch   ef 55-60%,  grade 1 diastolic dysfunction/  trivial AR and TR  mild MR and PR/  moderate LAE  . TRANSURETHRAL RESECTION OF PROSTATE  06-11-2010   dr Patsi Sears  [facility]   w/  GYRUS  . TRANSURETHRAL RESECTION OF PROSTATE N/A 12/06/2017   Procedure: TRANSURETHRAL RESECTION OF THE PROSTATE (TURP)/ BIPOLAR;  Surgeon: Rene Paci, MD;  Location: [facility];  Service: Urology;  Laterality: N/A;  . TRANSURETHRAL RESECTION OF PROSTATE N/A 04/11/2018   Procedure: TRANSURETHRAL RESECTION OF THE PROSTATE (TURP);  Surgeon: Rene Paci, MD;  Location: [facility];  Service: Urology;  Laterality: N/A;  . UPPER GASTROINTESTINAL ENDOSCOPY     Social History   Socioeconomic History  . Marital status: Married    Spouse name: Not on file  . Number of children: 3  . Years of education: Not on file  . Highest education level: Not on file  Occupational History  . Occupation: retired  Tobacco Use  . Smoking status: Former    Packs/day: 2.00    Years: 15.00    Additional pack years: 0.00    Total pack years: 30.00    Types: Cigarettes    Quit date: 11/30/1967    Years since quitting: 55.4  . Smokeless tobacco: Never  Vaping Use  . Vaping Use: Never used  Substance and Sexual Activity  . Alcohol use: Yes    Alcohol/week: 2.0 standard drinks of alcohol    Types: 2 Standard  drinks or equivalent per week    Comment: occasional  . Drug use: No  . Sexual activity: Yes  Other Topics Concern  . Not on file  Social History Narrative   Do you drink/eat things with caffeine? Very Little    Marital status/What year were you married? Married since 1961   Do you live in a house, apartment, assisted living, condo, trailer, etc.? Did not answer   Is it one or more stories? One  How many persons live in your home? 2    Do you have any pets in your home? No   Current or past profession: Brewing technologist.    Do you exercise? Very little    Type and how often? Did not answer    Do you have a living will? Yes   Do you have a DNR form? Did not answer   Do you have signed POA/HPOA forms? Did not answer   Social Determinants of Health   Financial Resource Strain: Not on file  Food Insecurity: Not on file  Transportation Needs: Not on file  Physical Activity: Not on file  Stress: Not on file  Social Connections: Not on file   Family History  Problem Relation Age of Onset  . Healthy Mother   . Healthy Father   . Other Sister   . Healthy Sister   . Thyroid cancer Brother   . Heart disease Brother   . Diabetes type I Son   . Colon cancer Neg Hx   . Esophageal cancer Neg Hx   . Stomach cancer Neg Hx   . Rectal cancer Neg Hx    Allergies  Allergen Reactions  . Cyclobenzaprine Other (See Comments)    Lost balance  . Lisinopril Other (See Comments)    Affected ability to urinate  . Relafen [Nabumetone] Other (See Comments)    Problems urinating afterwards   . Statins Other (See Comments)    "my peeing"   Current Outpatient Medications  Medication Sig Dispense Refill  . aspirin EC 81 MG tablet Take 81 mg by mouth daily.    . AZO-CRANBERRY PO Take 1 tablet by mouth daily.    . carvedilol (COREG) 6.25 MG tablet TAKE 1 TABLET BY MOUTH TWICE A DAY 180 tablet 2  . Cyanocobalamin (VITAMIN B12) 1000 MCG TBCR Take 1,000 mcg by mouth.    . diazepam (VALIUM) 5 MG  tablet TAKE 0.5 TABLETS (2.5 MG TOTAL) BY MOUTH 2 (TWO) TIMES DAILY. 1/2 TABLET EVERY 2-3 DAYS 30 tablet 1  . gabapentin (NEURONTIN) 300 MG capsule TAKE 1 CAPSULE EVERY DAY 90 capsule 3  . levothyroxine (SYNTHROID) 25 MCG tablet Take 25 mcg by mouth. Every Morning except on SUN    . levothyroxine (SYNTHROID) 50 MCG tablet Take 50 mcg by mouth. On Sunday    . omeprazole (PRILOSEC) 20 MG capsule Take 1 capsule (20 mg total) by mouth daily. 90 capsule 3  . polyethylene glycol (MIRALAX / GLYCOLAX) packet Take 17 g by mouth daily as needed for mild constipation.    . Probiotic Product (PROBIOTIC PO) Take 1 capsule by mouth daily.    Marland Kitchen trimethoprim (TRIMPEX) 100 MG tablet Take 100 mg by mouth at bedtime.     Current Facility-Administered Medications  Medication Dose Route Frequency Provider Last Rate Last Admin  . 0.9 %  sodium chloride infusion  500 mL Intravenous Once Hilarie Fredrickson, MD       No results found.  Review of Systems:   A ROS was performed including pertinent positives and negatives as documented in the HPI.  Physical Exam :   Constitutional: NAD and appears stated age Neurological: Alert and oriented Psych: Appropriate affect and cooperative There were no vitals taken for this visit.   Comprehensive Musculoskeletal Exam:    Tenderness to palpation about the left wrist.  This is predominantly over the distal radius.  Negative Phalen or Tinel's test over the carpal tunnel.  No tenderness about the ulnar  styloid.  Imaging:   Xray (3 views left wrist): There is a large lesion involving the distal radius which appears punched out in nature.  There is some small satellite areas of involvement as well near the DRUJ.  He has significant chondrocalcinosis about the wrist as well.   I personally reviewed and interpreted the radiographs.   Assessment:   89 y.o. male with left wrist pain in the setting of what appears to be a punched-out lesion involving the distal radius.  At this  time he does have a plan to have labs drawn for multiple myeloma which point this will help to further ascertain the source of his pain.  He does have fairly significant chondrocalcinosis but at this point I believe that this lesion is more consistent with his diffuse achy type pain.  He did see the oncology team here who was last concerned about any type of multiple myeloma.  As result I do believe the majority of his pain is from his chondrocalcinosis  Plan :    -Left wrist ultrasound-guided injection performed after verbal consent obtained    Procedure Note  Patient: David Gutierrez             Date of Birth: Feb 26, 1933           MRN: [id number]             Visit Date: 04/26/2023  Procedures: Visit Diagnoses: No diagnosis found.  Medium Joint Inj on 04/26/2023 5:06 PM Details: ultrasound-guided         I personally saw and evaluated the patient, and participated in the management and treatment plan.  Huel Cote, MD Attending Physician, Orthopedic Surgery  This document was dictated using Dragon voice recognition software. A reasonable attempt at proof reading has been made to minimize errors.

## 2023-05-06 DIAGNOSIS — R3914 Feeling of incomplete bladder emptying: Secondary | ICD-10-CM | POA: Diagnosis not present

## 2023-05-06 DIAGNOSIS — N139 Obstructive and reflux uropathy, unspecified: Secondary | ICD-10-CM | POA: Diagnosis not present

## 2023-05-06 DIAGNOSIS — N312 Flaccid neuropathic bladder, not elsewhere classified: Secondary | ICD-10-CM | POA: Diagnosis not present

## 2023-05-08 ENCOUNTER — Encounter: Payer: Self-pay | Admitting: Adult Health

## 2023-05-08 ENCOUNTER — Non-Acute Institutional Stay: Payer: PPO | Admitting: Adult Health

## 2023-05-08 VITALS — BP 122/74 | HR 60 | Temp 98.1°F | Resp 16 | Ht 71.0 in | Wt 183.0 lb

## 2023-05-08 DIAGNOSIS — M25432 Effusion, left wrist: Secondary | ICD-10-CM | POA: Diagnosis not present

## 2023-05-08 DIAGNOSIS — E039 Hypothyroidism, unspecified: Secondary | ICD-10-CM

## 2023-05-08 NOTE — Progress Notes (Signed)
Location:  [facility]  POS: Clinic  Provider: Fletcher Anon, ANP   Goals of Care:     05/08/2023    3:39 PM  Advanced Directives  Does Patient Have a Medical Advance Directive? Yes  Type of Estate agent of [place];Living will;Out of facility DNR (pink MOST or yellow form)  Does patient want to make changes to medical advance directive? No - Patient declined  Copy of Healthcare Power of Attorney in Chart? No - copy requested     Chief Complaint  Patient presents with   Medical Management of Chronic Issues    Patient states he is here for follow up on his wrist pain    HPI: Patient is a 89 y.o. male seen today for f/u on his wrist pain and thyroid  Reports his wrist pain and swelling on the left is completely resolved. He saw ortho on 04/26/23 and has an ultrasound guided injection.  Prior to this he received prednisone from Dr Chales Abrahams 04/11/23.  Labs drawn  May 2024: RF Factor 39  TSH 6.87 CRP 1.14 Uric acid 5.4  Past Medical History:  Diagnosis Date   Alcoholic peripheral neuropathy [facility])    neurologist-  dr patel--- feet numbness and sensory ataxia   Arthritis    B12 deficiency    BPH (benign prostatic hyperplasia)    CAD (coronary artery disease) cardiolgoist-  dr Cristal Deer end (previouly dr dalton Shirlee Latch)   a. Myoview 11/13:  EF 38%, inf and IS defect c/w scar but no ischemia:  b. Cardiac CT 11/13:  Ca score 318 Agatson units, pLAD and dCFX plaque;   c. LHC 11/07/12:  pLAD 30%, oD1 40%, oCFX 30%, dCFX 40-50%, mRCA 40%, EF 50% (frequent PVCs and short run of NSVT with injection/    per last echo 01/ 2017  ef 55-60%   Chronic constipation    Diverticulosis of colon    Esophageal reflux    External hemorrhoids    First degree heart block    Hiatal hernia    History of adenomatous polyp of colon    History of esophageal stricture 08/11/2015   s/p  dilatation   History of lower GI bleeding 03/01/2017   s/p  flexiable simoidscopy w/ clipping  rectum ulcer   Hypogonadism male    Hypothyroidism    Interstitial cystitis    Irritable bowel syndrome    LBBB (left bundle branch block)    NICM (nonischemic cardiomyopathy) [facility]) cardiologist-  dr Cristal Deer end (previously dr dalton Shirlee Latch)--  per last echo 01/ 2017  ef 55-60%   ? 2/2 LBBB => a. echo 11/13: diff HK, worse in septum and apex, mod LVE, mild LVH, EF 40%, mild AI, mild MR, mod LAE   Pre-diabetes    Self-catheterizes urinary bladder    bid to tid    Wears glasses    Wears hearing aid in both ears     Past Surgical History:  Procedure Laterality Date   CARDIAC CATHETERIZATION  11-07-2012    dr Shirlee Latch   nonobstructive CAD (pLAD 30%, ostial D1 40%, ostial LCx 30%, dLCx diffuse 40-50%, mRCA 40%) ;  LVSF 50% but diffiult due to PVCs and short run VT with injection;  cardiomyopathy mostly likely a LBBB cardiomyopathy   CARDIOVASCULAR STRESS TEST  10-16-2012   dr Shirlee Latch   Low risk adenosine nuclear study (no exercise) w/ a fixed inferior and inferoseptal defect without ischemia (question as whether this abnormality due to LBBB cardiomyopathy or due to scar)/  LV ef 38%,  LV wall motion decreased of the septum and entire apex   CATARACT EXTRACTION W/ INTRAOCULAR LENS  IMPLANT, BILATERAL  2012  approx.   COLONOSCOPY  2016   Dr. Marina Goodell, Per new patient form   CYSTO/  HYDRODISTENTION/  BLADDER BIOPSY  04-20-2005    dr Darvin Neighbours  [facility]   ETHMOIDECTOMY Right 12/12/2016   Procedure: RIGHT ENDOSCOPIC ETHMOIDECTOMY;  Surgeon: Newman Pies, MD;  Location: [facility];  Service: ENT;  Laterality: Right;   FLEXIBLE SIGMOIDOSCOPY N/A 03/02/2017   Procedure: FLEXIBLE SIGMOIDOSCOPY;  Surgeon: Meryl Dare, MD;  Location[facility];  Service: Endoscopy;  Laterality: N/A;   LUMBAR LAMINECTOMY  1952   MAXILLARY ANTROSTOMY Right 12/12/2016   Procedure: RIGHT ENDOSCOPIC MAXILLARY ANTROSTOMY;  Surgeon: Newman Pies, MD;  Location: [facility];  Service: ENT;  Laterality:  Right;   SINUS ENDO W/FUSION Right 12/12/2016   Procedure: ENDOSCOPIC SINUS SURGERY WITH NAVIGATION;  Surgeon: Newman Pies, MD;  Location: [facility];  Service: ENT;  Laterality: Right;   SPHENOIDECTOMY Right 12/12/2016   Procedure: RIGHT ENDOSCOPIC SPHENOIDECTOMY;  Surgeon: Newman Pies, MD;  Location: [facility];  Service: ENT;  Laterality: Right;   TONSILLECTOMY AND ADENOIDECTOMY  child   TOTAL KNEE ARTHROPLASTY Left 10/14/2014   Procedure: LEFT TOTAL KNEE ARTHROPLASTY;  Surgeon: Shelda Pal, MD;  Location: [facility];  Service: Orthopedics;  Laterality: Left;   TOTAL KNEE ARTHROPLASTY Right 1990s   TRANSTHORACIC ECHOCARDIOGRAM  12-24-2015   dr Shirlee Latch   ef 55-60%,  grade 1 diastolic dysfunction/  trivial AR and TR  mild MR and PR/  moderate LAE   TRANSURETHRAL RESECTION OF PROSTATE  06-11-2010   dr Patsi Sears  [facility]   w/  GYRUS   TRANSURETHRAL RESECTION OF PROSTATE N/A 12/06/2017   Procedure: TRANSURETHRAL RESECTION OF THE PROSTATE (TURP)/ BIPOLAR;  Surgeon: Rene Paci, MD;  Location: [facility];  Service: Urology;  Laterality: N/A;   TRANSURETHRAL RESECTION OF PROSTATE N/A 04/11/2018   Procedure: TRANSURETHRAL RESECTION OF THE PROSTATE (TURP);  Surgeon: Rene Paci, MD;  Location: [facility];  Service: Urology;  Laterality: N/A;   UPPER GASTROINTESTINAL ENDOSCOPY      Allergies  Allergen Reactions   Cyclobenzaprine Other (See Comments)    Lost balance   Lisinopril Other (See Comments)    Affected ability to urinate   Relafen [Nabumetone] Other (See Comments)    Problems urinating afterwards    Statins Other (See Comments)    "my peeing"    Outpatient Encounter Medications as of 05/08/2023  Medication Sig   aspirin EC 81 MG tablet Take 81 mg by mouth daily.   AZO-CRANBERRY PO Take 1 tablet by mouth daily.   carvedilol (COREG) 6.25 MG tablet TAKE 1 TABLET BY MOUTH TWICE A DAY   Cyanocobalamin (VITAMIN B12) 1000 MCG TBCR  Take 1,000 mcg by mouth.   diazepam (VALIUM) 5 MG tablet TAKE 0.5 TABLETS (2.5 MG TOTAL) BY MOUTH 2 (TWO) TIMES DAILY. 1/2 TABLET EVERY 2-3 DAYS   gabapentin (NEURONTIN) 300 MG capsule TAKE 1 CAPSULE EVERY DAY   levothyroxine (SYNTHROID) 25 MCG tablet Take 25 mcg by mouth. Every Morning except on SUN   levothyroxine (SYNTHROID) 50 MCG tablet Take 50 mcg by mouth. On Sunday   omeprazole (PRILOSEC) 20 MG capsule Take 1 capsule (20 mg total) by mouth daily.   polyethylene glycol (MIRALAX / GLYCOLAX) packet Take 17 g by mouth daily as  needed for mild constipation.   Probiotic Product (PROBIOTIC PO) Take 1 capsule by mouth daily.   trimethoprim (TRIMPEX) 100 MG tablet Take 100 mg by mouth at bedtime.   Facility-Administered Encounter Medications as of 05/08/2023  Medication   0.9 %  sodium chloride infusion    Review of Systems:  Review of Systems  Constitutional:  Negative for activity change, appetite change, chills, diaphoresis, fatigue, fever and unexpected weight change.  Respiratory:  Negative for cough, shortness of breath, wheezing and stridor.   Cardiovascular:  Positive for leg swelling. Negative for chest pain and palpitations.  Gastrointestinal:  Negative for abdominal distention, abdominal pain, constipation and diarrhea.  Genitourinary:  Negative for difficulty urinating and dysuria.  Musculoskeletal:  Positive for arthralgias, gait problem and joint swelling. Negative for back pain and myalgias.  Skin:  Positive for color change.  Neurological:  Negative for dizziness, seizures, syncope, facial asymmetry, speech difficulty, weakness and headaches.  Hematological:  Negative for adenopathy. Does not bruise/bleed easily.  Psychiatric/Behavioral:  Negative for agitation, behavioral problems and confusion.     Health Maintenance  Topic Date Due   COVID-19 Vaccine (6 - 2023-24 season) 05/24/2023 (Originally 08/05/2022)   INFLUENZA VACCINE  07/06/2023   Medicare Annual Wellness (AWV)   04/09/2024   DTaP/Tdap/Td (5 - Td or Tdap) 12/06/2027   Pneumonia Vaccine 17+ Years old  Completed   Zoster Vaccines- Shingrix  Completed   HPV VACCINES  Aged Out   Colonoscopy  Discontinued    Physical Exam: Vitals:   05/08/23 1537  BP: 122/74  Pulse: 60  Resp: 16  Temp: 98.1 F (36.7 C)  TempSrc: Temporal  SpO2: 96%  Weight: 183 lb (83 kg)  Height: 5\' 11"  (1.803 m)   Body mass index is 25.52 kg/m. Physical Exam Vitals and nursing note reviewed.  Constitutional:      General: He is not in acute distress.    Appearance: He is not diaphoretic.  HENT:     Head: Normocephalic and atraumatic.  Neck:     Thyroid: No thyromegaly.     Vascular: No JVD.     Trachea: No tracheal deviation.  Cardiovascular:     Rate and Rhythm: Normal rate and regular rhythm.     Heart sounds: No murmur heard. Pulmonary:     Effort: Pulmonary effort is normal. No respiratory distress.     Breath sounds: Normal breath sounds. No wheezing.  Abdominal:     General: Bowel sounds are normal. There is no distension.     Palpations: Abdomen is soft.     Tenderness: There is no abdominal tenderness.  Musculoskeletal:     Cervical back: No rigidity or tenderness.  Lymphadenopathy:     Cervical: No cervical adenopathy.  Skin:    General: Skin is warm and dry.  Neurological:     Mental Status: He is alert and oriented to person, place, and time.     Cranial Nerves: No cranial nerve deficit.     Labs reviewed: Basic Metabolic Panel: Recent Labs    07/26/22 0000 01/10/23 1404 02/21/23 0000 04/25/23 0000  NA 140  --  141  --   K 4.2  --  4.7  --   CL 104  --  104  --   CO2 26*  --  28*  --   BUN 12  --  11  --   CREATININE 0.7  --  0.6  --   CALCIUM 9.3  --  9.3  --  TSH 6.85* 8.400* 9.14* 6.87*   Liver Function Tests: Recent Labs    07/26/22 0000 02/21/23 0000  AST 24 26  ALT 15 15  ALKPHOS 77 85  ALBUMIN 4.1 4.1   No results for input(s): "LIPASE", "AMYLASE" in the last  8760 hours. No results for input(s): "AMMONIA" in the last 8760 hours. CBC: Recent Labs    09/07/22 1047 01/05/23 1116 02/21/23 0000  WBC 5.7 4.0 4.2  NEUTROABS 3.2 1.8  --   HGB 12.5* 13.0 12.6*  HCT 38.7* 40.6 38*  MCV 95.1 95.8  --   PLT 107* 94* 112*   Lipid Panel: No results for input(s): "CHOL", "HDL", "LDLCALC", "TRIG", "CHOLHDL", "LDLDIRECT" in the last 8760 hours. Lab Results  Component Value Date   HGBA1C 5.2 04/10/2018    Procedures since last visit: No results found.  Assessment/Plan 1. Swelling of left wrist Improved after prednisone and injection Has elevated RF factor unclear significance Normal ESR and slightly elevated CRP NO other joint issues Will recheck labs in two months F/u with Dr Chales Abrahams.   2. Hypothyroidism, unspecified type TSH trending down Continue current dose of Synthroid Recheck in 2 months     Labs/tests ordered:  * No order type specified * Next appt:  07/31/2023   Total time :  time greater than 50% of total time spent doing pt counseling and coordination of care

## 2023-05-11 DIAGNOSIS — L570 Actinic keratosis: Secondary | ICD-10-CM | POA: Diagnosis not present

## 2023-05-11 DIAGNOSIS — Z85828 Personal history of other malignant neoplasm of skin: Secondary | ICD-10-CM | POA: Diagnosis not present

## 2023-05-17 DIAGNOSIS — Z961 Presence of intraocular lens: Secondary | ICD-10-CM | POA: Diagnosis not present

## 2023-05-17 DIAGNOSIS — H52203 Unspecified astigmatism, bilateral: Secondary | ICD-10-CM | POA: Diagnosis not present

## 2023-05-17 DIAGNOSIS — H524 Presbyopia: Secondary | ICD-10-CM | POA: Diagnosis not present

## 2023-06-05 DIAGNOSIS — N139 Obstructive and reflux uropathy, unspecified: Secondary | ICD-10-CM | POA: Diagnosis not present

## 2023-06-05 DIAGNOSIS — N312 Flaccid neuropathic bladder, not elsewhere classified: Secondary | ICD-10-CM | POA: Diagnosis not present

## 2023-06-05 DIAGNOSIS — R3914 Feeling of incomplete bladder emptying: Secondary | ICD-10-CM | POA: Diagnosis not present

## 2023-06-26 ENCOUNTER — Encounter: Payer: PPO | Admitting: Adult Health

## 2023-07-06 ENCOUNTER — Inpatient Hospital Stay: Payer: PPO | Admitting: Oncology

## 2023-07-06 ENCOUNTER — Inpatient Hospital Stay: Payer: PPO | Attending: Oncology

## 2023-07-06 VITALS — BP 119/65 | HR 65 | Temp 97.9°F | Resp 18 | Ht 71.0 in | Wt 181.0 lb

## 2023-07-06 DIAGNOSIS — D696 Thrombocytopenia, unspecified: Secondary | ICD-10-CM | POA: Insufficient documentation

## 2023-07-06 DIAGNOSIS — E538 Deficiency of other specified B group vitamins: Secondary | ICD-10-CM | POA: Insufficient documentation

## 2023-07-06 DIAGNOSIS — E039 Hypothyroidism, unspecified: Secondary | ICD-10-CM | POA: Insufficient documentation

## 2023-07-06 DIAGNOSIS — D649 Anemia, unspecified: Secondary | ICD-10-CM | POA: Diagnosis not present

## 2023-07-06 LAB — CBC WITH DIFFERENTIAL (CANCER CENTER ONLY)
Abs Immature Granulocytes: 0.02 10*3/uL (ref 0.00–0.07)
Basophils Absolute: 0 10*3/uL (ref 0.0–0.1)
Basophils Relative: 1 %
Eosinophils Absolute: 0.1 10*3/uL (ref 0.0–0.5)
Eosinophils Relative: 2 %
HCT: 37.8 % — ABNORMAL LOW (ref 39.0–52.0)
Hemoglobin: 12.2 g/dL — ABNORMAL LOW (ref 13.0–17.0)
Immature Granulocytes: 1 %
Lymphocytes Relative: 43 %
Lymphs Abs: 1.7 10*3/uL (ref 0.7–4.0)
MCH: 30.5 pg (ref 26.0–34.0)
MCHC: 32.3 g/dL (ref 30.0–36.0)
MCV: 94.5 fL (ref 80.0–100.0)
Monocytes Absolute: 0.3 10*3/uL (ref 0.1–1.0)
Monocytes Relative: 8 %
Neutro Abs: 1.8 10*3/uL (ref 1.7–7.7)
Neutrophils Relative %: 45 %
Platelet Count: 106 10*3/uL — ABNORMAL LOW (ref 150–400)
RBC: 4 MIL/uL — ABNORMAL LOW (ref 4.22–5.81)
RDW: 20 % — ABNORMAL HIGH (ref 11.5–15.5)
WBC Count: 3.9 10*3/uL — ABNORMAL LOW (ref 4.0–10.5)
nRBC: 0 % (ref 0.0–0.2)

## 2023-07-06 LAB — CMP (CANCER CENTER ONLY)
ALT: 8 U/L (ref 0–44)
AST: 14 U/L — ABNORMAL LOW (ref 15–41)
Albumin: 4 g/dL (ref 3.5–5.0)
Alkaline Phosphatase: 67 U/L (ref 38–126)
Anion gap: 5 (ref 5–15)
BUN: 11 mg/dL (ref 8–23)
CO2: 29 mmol/L (ref 22–32)
Calcium: 9.1 mg/dL (ref 8.9–10.3)
Chloride: 104 mmol/L (ref 98–111)
Creatinine: 0.62 mg/dL (ref 0.61–1.24)
GFR, Estimated: >60 mL/min (ref >60)
Glucose, Bld: 90 mg/dL (ref 70–99)
Potassium: 4.3 mmol/L (ref 3.5–5.1)
Sodium: 138 mmol/L (ref 135–145)
Total Bilirubin: 0.9 mg/dL (ref 0.3–1.2)
Total Protein: 6.3 g/dL — ABNORMAL LOW (ref 6.5–8.1)

## 2023-07-06 NOTE — Progress Notes (Signed)
  [name]   Diagnosis: Thrombocytopenia  INTERVAL HISTORY:   [name] returns as scheduled.  He generally feels well.  He has bruises over the arms.   Objective:  Vital signs in last 24 hours:  Blood pressure 119/65, pulse 65, temperature 97.9 F (36.6 C), temperature source Oral, resp. rate 18, height 5\' 11"  (1.803 m), weight 181 lb (82.1 kg), SpO2 98%.    Lymphatics: No cervical, supraclavicular, axillary, or inguinal nodes Resp: Distant breath sounds, no respiratory distress Cardio: Regular r rhythm, distant heart sounds GI: No hepatosplenomegaly Vascular: Trace edema with chronic stasis change at the lower leg bilaterally  Skin: Changes of senile purpura at the lower arms and legs  Portacath/PICC-without erythema  Lab Results:  Lab Results  Component Value Date   WBC 3.9 (L) 07/06/2023   HGB 12.2 (L) 07/06/2023   HCT 37.8 (L) 07/06/2023   MCV 94.5 07/06/2023   PLT 106 (L) 07/06/2023   NEUTROABS 1.8 07/06/2023    CMP  Lab Results  Component Value Date   NA 138 07/06/2023   K 4.3 07/06/2023   CL 104 07/06/2023   CO2 29 07/06/2023   GLUCOSE 90 07/06/2023   BUN 11 07/06/2023   CREATININE 0.62 07/06/2023   CALCIUM 9.1 07/06/2023   PROT 6.3 (L) 07/06/2023   ALBUMIN 4.0 07/06/2023   AST 14 (L) 07/06/2023   ALT 8 07/06/2023   ALKPHOS 67 07/06/2023   BILITOT 0.9 07/06/2023   GFRNONAA >60 07/06/2023   GFRAA >60 04/10/2018    Medications: I have reviewed the patient's current medications.   Assessment/Plan: Mild thrombocytopenia and anemia BPH/neurogenic bladder, self catheterizes Hypothyroid CAD Nonischemic cardiomyopathy Chronic left bundle branch block Peripheral neuropathy Esophageal stricture/dysphagia followed by Dr. 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  He has persistent mild thrombocytopenia and anemia.  The hematologic findings may be related to alcohol use, myelodysplasia, chronic liver disease, or ITP.  We will follow-up on results of a repeat myeloma panel from today.  He will return for an office and lab visit in 6 months.  Thornton Papas, MD  07/06/2023  12:34 PM

## 2023-07-11 ENCOUNTER — Telehealth: Payer: Self-pay

## 2023-07-11 NOTE — Telephone Encounter (Signed)
Patient gave verbal understanding and had no further questions or concerns  

## 2023-07-11 NOTE — Telephone Encounter (Signed)
-----   Message from Thornton Papas sent at 07/10/2023  5:29 PM EDT ----- Please call patient, the myeloma panel is negative, follow-up as scheduled

## 2023-07-17 ENCOUNTER — Other Ambulatory Visit: Payer: Self-pay | Admitting: Adult Health

## 2023-07-17 NOTE — Telephone Encounter (Signed)
Patient has request refill on medication Valium 5mg . Patient medication last refilled 03/13/2023. Medication pend and sent to PCP Mahlon Gammon, MD

## 2023-07-18 DIAGNOSIS — N312 Flaccid neuropathic bladder, not elsewhere classified: Secondary | ICD-10-CM | POA: Diagnosis not present

## 2023-07-18 DIAGNOSIS — N139 Obstructive and reflux uropathy, unspecified: Secondary | ICD-10-CM | POA: Diagnosis not present

## 2023-07-18 DIAGNOSIS — R3914 Feeling of incomplete bladder emptying: Secondary | ICD-10-CM | POA: Diagnosis not present

## 2023-07-19 ENCOUNTER — Other Ambulatory Visit: Payer: Self-pay | Admitting: Internal Medicine

## 2023-07-25 ENCOUNTER — Other Ambulatory Visit: Payer: Self-pay | Admitting: Internal Medicine

## 2023-07-25 DIAGNOSIS — M064 Inflammatory polyarthropathy: Secondary | ICD-10-CM | POA: Diagnosis not present

## 2023-07-25 DIAGNOSIS — E039 Hypothyroidism, unspecified: Secondary | ICD-10-CM | POA: Diagnosis not present

## 2023-07-25 DIAGNOSIS — I2510 Atherosclerotic heart disease of native coronary artery without angina pectoris: Secondary | ICD-10-CM | POA: Diagnosis not present

## 2023-07-25 DIAGNOSIS — M25432 Effusion, left wrist: Secondary | ICD-10-CM | POA: Diagnosis not present

## 2023-07-25 LAB — POCT ERYTHROCYTE SEDIMENTATION RATE, NON-AUTOMATED: Sed Rate: 12

## 2023-07-25 LAB — TSH: TSH: 8.7 — AB (ref 0.41–5.90)

## 2023-07-25 MED ORDER — DIAZEPAM 5 MG PO TABS: 2.5000 mg | ORAL_TABLET | Freq: Two times a day (BID) | ORAL | 1 refills | Status: DC

## 2023-07-30 NOTE — Progress Notes (Unsigned)
Location:  [facility]  POS: Clinic  Provider: Fletcher Anon, ANP   Goals of Care:     07/31/2023    2:40 PM  Advanced Directives  Does Patient Have a Medical Advance Directive? Yes  Type of Estate agent of [place];Living will;Out of facility DNR (pink MOST or yellow form)  Does patient want to make changes to medical advance directive? No - Patient declined  Copy of Healthcare Power of Attorney in Chart? No - copy requested     Chief Complaint  Patient presents with  . Medical Management of Chronic Issues    Patient is being seen for 4 month follow up  . Immunizations    Discuss the need for shingles and covid vaccine     HPI: Patient is a 89 y.o. male seen today for medical management of chronic diseases.    BPH with neurogenic bladder self catheterizes  3-4 times  a day with hx of recurrent UTI on trimethoprim  Thrombocytopenia followed by Dr Truett Perna suspected to be due to ETOH use  He had some Left wrist swelling resolved, RF 39 04/13/23  ESR 12 RF 29 07/25/23  Hypothyroidism Lab Results  Component Value Date   TSH 6.87 (A) 04/25/2023  Peripheral neuropathy seen by neurology in Sept 2023 started on neurontin. Some gait issues. He feels his gait is worsening.  B12 and B1 deficiency in supplements Pancreatic cyst benign Has a history of nonobstructive CAD, nonischemic cardiomyopathy, left bundle branch block and HLD Last echo showed EF of 55 to 60% with mild MR 2017 Follows with Cardiology No SOB or chest pain GERD/hx of gastritis, hx of rectal ulcer on Prilosec.   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 Not formal exercise ETOH intake 4 oz  vodka per day   Past Medical History:  Diagnosis Date  . Alcoholic peripheral neuropathy [facility])    neurologist-  dr  patel--- feet numbness and sensory ataxia  . Arthritis   . B12 deficiency   . BPH (benign prostatic hyperplasia)   . CAD (coronary artery disease) cardiolgoist-  dr Cristal Deer end (previouly dr dalton Shirlee Latch)   a. Myoview 11/13:  EF 38%, inf and IS defect c/w scar but no ischemia:  b. Cardiac CT 11/13:  Ca score 318 Agatson units, pLAD and dCFX plaque;   c. LHC 11/07/12:  pLAD 30%, oD1 40%, oCFX 30%, dCFX 40-50%, mRCA 40%, EF 50% (frequent PVCs and short run of NSVT with injection/    per last echo 01/ 2017  ef 55-60%  . Chronic constipation   . Diverticulosis of colon   . Esophageal reflux   . External hemorrhoids   . First degree heart block   . Hiatal hernia   . History of adenomatous polyp of colon   . History of esophageal stricture 08/11/2015   s/p  dilatation  . History of lower GI bleeding 03/01/2017   s/p  flexiable simoidscopy w/ clipping rectum ulcer  . Hypogonadism male   . Hypothyroidism   . Interstitial cystitis   . Irritable bowel syndrome   . LBBB (left bundle branch block)   . NICM (nonischemic cardiomyopathy) [facility]) cardiologist-  dr Cristal Deer end (previously dr dalton Shirlee Latch)--  per last echo 01/ 2017  ef 55-60%   ? 2/2 LBBB =>  a. echo 11/13: diff HK, worse in septum and apex, mod LVE, mild LVH, EF 40%, mild AI, mild MR, mod LAE  . Pre-diabetes   . Self-catheterizes urinary bladder    bid to tid   . Wears glasses   . Wears hearing aid in both ears     Past Surgical History:  Procedure Laterality Date  . CARDIAC CATHETERIZATION  11-07-2012    dr Shirlee Latch   nonobstructive CAD (pLAD 30%, ostial D1 40%, ostial LCx 30%, dLCx diffuse 40-50%, mRCA 40%) ;  LVSF 50% but diffiult due to PVCs and short run VT with injection;  cardiomyopathy mostly likely a LBBB cardiomyopathy  . CARDIOVASCULAR STRESS TEST  10-16-2012   dr Shirlee Latch   Low risk adenosine nuclear study (no exercise) w/ a fixed inferior and inferoseptal defect without ischemia (question as whether this abnormality  due to LBBB cardiomyopathy or due to scar)/  LV ef 38%,  LV wall motion decreased of the septum and entire apex  . CATARACT EXTRACTION W/ INTRAOCULAR LENS  IMPLANT, BILATERAL  2012  approx.  . COLONOSCOPY  2016   Dr. Marina Goodell, Per new patient form  . CYSTO/  HYDRODISTENTION/  BLADDER BIOPSY  04-20-2005    dr Darvin Neighbours  [facility]  . ETHMOIDECTOMY Right 12/12/2016   Procedure: RIGHT ENDOSCOPIC ETHMOIDECTOMY;  Surgeon: Newman Pies, MD;  Location: [facility];  Service: ENT;  Laterality: Right;  . FLEXIBLE SIGMOIDOSCOPY N/A 03/02/2017   Procedure: FLEXIBLE SIGMOIDOSCOPY;  Surgeon: Meryl Dare, MD;  Location: [facility];  Service: Endoscopy;  Laterality: N/A;  . LUMBAR LAMINECTOMY  1952  . MAXILLARY ANTROSTOMY Right 12/12/2016   Procedure: RIGHT ENDOSCOPIC MAXILLARY ANTROSTOMY;  Surgeon: Newman Pies, MD;  Location: [facility];  Service: ENT;  Laterality: Right;  . SINUS ENDO W/FUSION Right 12/12/2016   Procedure: ENDOSCOPIC SINUS SURGERY WITH NAVIGATION;  Surgeon: Newman Pies, MD;  Location: [facility];  Service: ENT;  Laterality: Right;  . SPHENOIDECTOMY Right 12/12/2016   Procedure: RIGHT ENDOSCOPIC SPHENOIDECTOMY;  Surgeon: Newman Pies, MD;  Location: [facility];  Service: ENT;  Laterality: Right;  . TONSILLECTOMY AND ADENOIDECTOMY  child  . TOTAL KNEE ARTHROPLASTY Left 10/14/2014   Procedure: LEFT TOTAL KNEE ARTHROPLASTY;  Surgeon: Shelda Pal, MD;  Location: [facility];  Service: Orthopedics;  Laterality: Left;  . TOTAL KNEE ARTHROPLASTY Right 1990s  . TRANSTHORACIC ECHOCARDIOGRAM  12-24-2015   dr Shirlee Latch   ef 55-60%,  grade 1 diastolic dysfunction/  trivial AR and TR  mild MR and PR/  moderate LAE  . TRANSURETHRAL RESECTION OF PROSTATE  06-11-2010   dr Patsi Sears  [facility]   w/  GYRUS  . TRANSURETHRAL RESECTION OF PROSTATE N/A 12/06/2017   Procedure: TRANSURETHRAL RESECTION OF THE PROSTATE (TURP)/ BIPOLAR;  Surgeon: Rene Paci, MD;   Location: [facility];  Service: Urology;  Laterality: N/A;  . TRANSURETHRAL RESECTION OF PROSTATE N/A 04/11/2018   Procedure: TRANSURETHRAL RESECTION OF THE PROSTATE (TURP);  Surgeon: Rene Paci, MD;  Location: [facility];  Service: Urology;  Laterality: N/A;  . UPPER GASTROINTESTINAL ENDOSCOPY      Allergies  Allergen Reactions  . Cyclobenzaprine Other (See Comments)    Lost balance  . Lisinopril Other (See Comments)    Affected ability to urinate  . Relafen [Nabumetone] Other (See Comments)    Problems urinating afterwards   . Statins Other (See Comments)    "my peeing"    Outpatient Encounter Medications  as of 07/31/2023  Medication Sig  . AZO-CRANBERRY PO Take 1 tablet by mouth daily.  . carvedilol (COREG) 6.25 MG tablet TAKE 1 TABLET BY MOUTH TWICE A DAY  . Cyanocobalamin (VITAMIN B12) 1000 MCG TBCR Take 1,000 mcg by mouth.  . diazepam (VALIUM) 5 MG tablet Take 0.5 tablets (2.5 mg total) by mouth 2 (two) times daily. 1/2 tablet every 2-3 days  . gabapentin (NEURONTIN) 300 MG capsule TAKE 1 CAPSULE EVERY DAY  . levothyroxine (SYNTHROID) 25 MCG tablet Take 25 mcg by mouth. Every Morning except on SUN  . levothyroxine (SYNTHROID) 50 MCG tablet Take 50 mcg by mouth. On Sunday  . omeprazole (PRILOSEC) 20 MG capsule TAKE 1 CAPSULE BY MOUTH EVERY DAY  . polyethylene glycol (MIRALAX / GLYCOLAX) packet Take 17 g by mouth daily as needed for mild constipation.  . Probiotic Product (PROBIOTIC PO) Take 1 capsule by mouth daily.  Marland Kitchen trimethoprim (TRIMPEX) 100 MG tablet Take 100 mg by mouth 2 (two) times daily.   Facility-Administered Encounter Medications as of 07/31/2023  Medication  . 0.9 %  sodium chloride infusion    Review of Systems:  Review of Systems  Constitutional:  Negative for activity change, appetite change, chills, diaphoresis and fever.  Respiratory:  Negative for cough and wheezing.   Cardiovascular:  Negative for chest pain and leg swelling.   Gastrointestinal:  Negative for abdominal pain, constipation, diarrhea, nausea and vomiting.  Genitourinary:  Negative for dysuria and urgency.  Musculoskeletal:  Negative for back pain, myalgias and neck pain.  Skin:  Negative for rash.  Neurological:  Negative for weakness.  Psychiatric/Behavioral:  Negative for confusion.     Health Maintenance  Topic Date Due  . Zoster Vaccines- Shingrix (2 of 2) 01/29/2018  . COVID-19 Vaccine (6 - 2023-24 season) 08/05/2022  . INFLUENZA VACCINE  08/07/2023 (Originally 07/06/2023)  . Medicare Annual Wellness (AWV)  04/09/2024  . DTaP/Tdap/Td (5 - Td or Tdap) 12/06/2027  . Pneumonia Vaccine 7+ Years old  Completed  . HPV VACCINES  Aged Out  . Colonoscopy  Discontinued    Physical Exam: Vitals:   07/31/23 1438  BP: 128/74  Pulse: 65  Resp: 17  Temp: 98 F (36.7 C)  TempSrc: Temporal  SpO2: 95%  Weight: 182 lb (82.6 kg)  Height: 5\' 11"  (1.803 m)    Body mass index is 25.38 kg/m. Wt Readings from Last 3 Encounters:  07/31/23 182 lb (82.6 kg)  07/06/23 181 lb (82.1 kg)  05/08/23 183 lb (83 kg)    Physical Exam Constitutional:      General: He is not in acute distress.    Appearance: He is not diaphoretic.  HENT:     Head: Normocephalic and atraumatic.     Right Ear: Tympanic membrane normal.     Left Ear: Tympanic membrane normal.     Nose: Nose normal.     Mouth/Throat:     Mouth: Mucous membranes are moist.     Pharynx: Oropharynx is clear.  Eyes:     General:        Right eye: No discharge.        Left eye: No discharge.     Conjunctiva/sclera: Conjunctivae normal.     Pupils: Pupils are equal, round, and reactive to light.  Neck:     Thyroid: No thyromegaly.     Vascular: No JVD.     Trachea: No tracheal deviation.  Cardiovascular:     Rate and Rhythm: Normal rate and  regular rhythm.     Heart sounds: No murmur heard. Pulmonary:     Effort: Pulmonary effort is normal. No respiratory distress.     Breath sounds:  Normal breath sounds. No wheezing.  Abdominal:     General: Bowel sounds are normal. There is no distension.     Palpations: Abdomen is soft.     Tenderness: There is no abdominal tenderness.  Musculoskeletal:     Cervical back: No rigidity or tenderness.     Comments: BLE edema +1  Lymphadenopathy:     Cervical: No cervical adenopathy.  Skin:    General: Skin is warm and dry.  Neurological:     Mental Status: He is alert and oriented to person, place, and time.     Cranial Nerves: No cranial nerve deficit.    Labs reviewed: Basic Metabolic Panel: Recent Labs    01/10/23 1404 02/21/23 0000 04/25/23 0000 07/06/23 1123  NA  --  141  --  138  K  --  4.7  --  4.3  CL  --  104  --  104  CO2  --  28*  --  29  GLUCOSE  --   --   --  90  BUN  --  11  --  11  CREATININE  --  0.6  --  0.62  CALCIUM  --  9.3  --  9.1  TSH 8.400* 9.14* 6.87*  --    Liver Function Tests: Recent Labs    02/21/23 0000 07/06/23 1123  AST 26 14*  ALT 15 8  ALKPHOS 85 67  BILITOT  --  0.9  PROT  --  6.3*  ALBUMIN 4.1 4.0   No results for input(s): "LIPASE", "AMYLASE" in the last 8760 hours. No results for input(s): "AMMONIA" in the last 8760 hours. CBC: Recent Labs    09/07/22 1047 01/05/23 1116 02/21/23 0000 07/06/23 1123  WBC 5.7 4.0 4.2 3.9*  NEUTROABS 3.2 1.8  --  1.8  HGB 12.5* 13.0 12.6* 12.2*  HCT 38.7* 40.6 38* 37.8*  MCV 95.1 95.8  --  94.5  PLT 107* 94* 112* 106*   Lipid Panel: No results for input(s): "CHOL", "HDL", "LDLCALC", "TRIG", "CHOLHDL", "LDLDIRECT" in the last 8760 hours.  Lab Results  Component Value Date   HGBA1C 5.2 04/10/2018    Procedures since last visit: No results found.  Assessment/Plan  1. Mixed hyperlipidemia On crestor Needs lipid panel at next visit.   2. NICM (nonischemic cardiomyopathy) (HCC) No current CHF symptoms Followed by cardiology   3. Peripheral neuropathy, idiopathic Followed by neurology On neurontin   4. BPH with  urinary obstruction and neurogenic bladder Followed by urology No recent UTIs on prevention with Bactrim  Filled valium which helps bladder relax  5. B12 deficiency On supplementation  6. Slow transit constipation Try miralax 1/2 cap every day or every other day   7. Gastroesophageal reflux disease without esophagitis With hx of gastritis Continue prilosec  Labs/tests ordered:  * No order type specified * TSH in 8 weeks  Next appt:  4 months    Total time :  time greater than 50% of total time spent doing pt counseling and coordination of care      Declined covid vaccine

## 2023-07-31 ENCOUNTER — Non-Acute Institutional Stay: Payer: PPO | Admitting: Adult Health

## 2023-07-31 ENCOUNTER — Encounter: Payer: Self-pay | Admitting: Adult Health

## 2023-07-31 VITALS — BP 128/74 | HR 65 | Temp 98.0°F | Resp 17 | Ht 71.0 in | Wt 182.0 lb

## 2023-07-31 DIAGNOSIS — N138 Other obstructive and reflux uropathy: Secondary | ICD-10-CM

## 2023-07-31 DIAGNOSIS — E538 Deficiency of other specified B group vitamins: Secondary | ICD-10-CM | POA: Diagnosis not present

## 2023-07-31 DIAGNOSIS — K219 Gastro-esophageal reflux disease without esophagitis: Secondary | ICD-10-CM | POA: Diagnosis not present

## 2023-07-31 DIAGNOSIS — I428 Other cardiomyopathies: Secondary | ICD-10-CM | POA: Diagnosis not present

## 2023-07-31 DIAGNOSIS — E039 Hypothyroidism, unspecified: Secondary | ICD-10-CM | POA: Diagnosis not present

## 2023-07-31 DIAGNOSIS — D696 Thrombocytopenia, unspecified: Secondary | ICD-10-CM

## 2023-07-31 DIAGNOSIS — M25432 Effusion, left wrist: Secondary | ICD-10-CM

## 2023-07-31 DIAGNOSIS — N401 Enlarged prostate with lower urinary tract symptoms: Secondary | ICD-10-CM

## 2023-07-31 DIAGNOSIS — G609 Hereditary and idiopathic neuropathy, unspecified: Secondary | ICD-10-CM

## 2023-07-31 DIAGNOSIS — N312 Flaccid neuropathic bladder, not elsewhere classified: Secondary | ICD-10-CM | POA: Diagnosis not present

## 2023-07-31 DIAGNOSIS — M25532 Pain in left wrist: Secondary | ICD-10-CM | POA: Diagnosis not present

## 2023-07-31 DIAGNOSIS — N3020 Other chronic cystitis without hematuria: Secondary | ICD-10-CM | POA: Diagnosis not present

## 2023-07-31 MED ORDER — LEVOTHYROXINE SODIUM 50 MCG PO TABS: 50.0000 ug | ORAL_TABLET | Freq: Every day | ORAL | 3 refills | Status: DC

## 2023-07-31 MED ORDER — LEVOTHYROXINE SODIUM 25 MCG PO TABS: 25.0000 ug | ORAL_TABLET | Freq: Every day | ORAL | 3 refills | Status: DC

## 2023-07-31 NOTE — Patient Instructions (Signed)
Recommendation to reduce alcohol intake  Encourage physical activity 3-5 times per week

## 2023-08-01 ENCOUNTER — Encounter: Payer: Self-pay | Admitting: Adult Health

## 2023-08-01 DIAGNOSIS — D696 Thrombocytopenia, unspecified: Secondary | ICD-10-CM | POA: Insufficient documentation

## 2023-08-23 DIAGNOSIS — N312 Flaccid neuropathic bladder, not elsewhere classified: Secondary | ICD-10-CM | POA: Diagnosis not present

## 2023-08-23 DIAGNOSIS — R3914 Feeling of incomplete bladder emptying: Secondary | ICD-10-CM | POA: Diagnosis not present

## 2023-08-23 DIAGNOSIS — N139 Obstructive and reflux uropathy, unspecified: Secondary | ICD-10-CM | POA: Diagnosis not present

## 2023-09-04 ENCOUNTER — Ambulatory Visit: Payer: PPO | Admitting: Neurology

## 2023-09-04 ENCOUNTER — Other Ambulatory Visit: Payer: Self-pay | Admitting: Internal Medicine

## 2023-09-04 ENCOUNTER — Encounter: Payer: Self-pay | Admitting: Neurology

## 2023-09-04 ENCOUNTER — Other Ambulatory Visit: Payer: Self-pay | Admitting: Neurology

## 2023-09-04 VITALS — BP 138/73 | HR 54 | Ht 71.0 in | Wt 182.0 lb

## 2023-09-04 DIAGNOSIS — G621 Alcoholic polyneuropathy: Secondary | ICD-10-CM

## 2023-09-04 MED ORDER — GABAPENTIN 300 MG PO CAPS: ORAL_CAPSULE | ORAL | 3 refills | Status: DC

## 2023-09-04 NOTE — Progress Notes (Signed)
Follow-up Visit   Date: 09/04/23    David Gutierrez MRN: [id number] DOB: 09/17/1933   Interim History: David Gutierrez is a 89 y.o. right-handed Caucasian male with hypertension, CAD, GERD, hyperlipidemia, interstitial cystitis, s/p lumbar surgery (1952) and hypothyroidism returning to the clinic for follow-up of neuropathy, alcohol-induced.  The patient was accompanied to the clinic by self.  History of present illness: A few years ago, he began noticing imbalance when climbing hills while playing golf.  He gave up playing golf late 2016 because of difficulty with balance.  He walk independently, but when walking his dog he takes a hiking stick which helps. He stays very active and had a trainer as well as classes that he attends for balance.   Several years ago, he also noticed that he has numbness involving the feet. He drinks about 4-5oz vodka and occasionally wine for the past 20 years.  No history of diabetes. He was diagnosed with alcohol-induced neuropathy.  He quit drinking alcohol for 1 year and noticed mild improvement in his feet paresthesias, but not enough for him to want to completely abstain from alcohol.  He has been using a foot massager with electrical stimulator, which provides some relief.  UPDATE 09/04/2023:  He is here for follow-up visit.  He continues to gabapentin 300mg  at bedtime which controls tingling. Numbness is unchanged and involves both feet.  There has been no progression.  He denies pain.  He is using electrical shock treatment to the feet.  His balance continues to be poor, fortunately, no falls. He denies problems with driving.   Medications:  Current Outpatient Medications on File Prior to Visit  Medication Sig Dispense Refill   AZO-CRANBERRY PO Take 1 tablet by mouth daily.     carvedilol (COREG) 6.25 MG tablet TAKE 1 TABLET BY MOUTH TWICE A DAY 180 tablet 2   Cyanocobalamin (VITAMIN B12) 1000 MCG TBCR Take 1,000 mcg by mouth.     diazepam (VALIUM) 5  MG tablet Take 0.5 tablets (2.5 mg total) by mouth 2 (two) times daily. 1/2 tablet every 2-3 days 30 tablet 1   gabapentin (NEURONTIN) 300 MG capsule TAKE 1 CAPSULE EVERY DAY 90 capsule 3   levothyroxine (SYNTHROID) 25 MCG tablet Take 1 tablet (25 mcg total) by mouth daily before breakfast. Tuesday Thursday Friday Saturday 30 tablet 3   levothyroxine (SYNTHROID) 50 MCG tablet Take 1 tablet (50 mcg total) by mouth daily before breakfast. Monday Wednesday Sunday 30 tablet 3   omeprazole (PRILOSEC) 20 MG capsule TAKE 1 CAPSULE BY MOUTH EVERY DAY 90 capsule 1   polyethylene glycol (MIRALAX / GLYCOLAX) packet Take 17 g by mouth daily as needed for mild constipation.     Probiotic Product (PROBIOTIC PO) Take 1 capsule by mouth daily.     trimethoprim (TRIMPEX) 100 MG tablet Take 100 mg by mouth 2 (two) times daily.     No current facility-administered medications on file prior to visit.    Allergies:  Allergies  Allergen Reactions   Cyclobenzaprine Other (See Comments)    Lost balance   Lisinopril Other (See Comments)    Affected ability to urinate   Relafen [Nabumetone] Other (See Comments)    Problems urinating afterwards    Statins Other (See Comments)    "my peeing"    Vital Signs:  BP 138/73   Pulse (!) 54   Ht 5\' 11"  (1.803 m)   Wt 182 lb (82.6 kg)   SpO2 98%  BMI 25.38 kg/m   Neurological Exam: MENTAL STATUS including orientation to time, place, person, recent and remote memory, attention span and concentration, language, and fund of knowledge is normal.  Speech is not dysarthric.  CRANIAL NERVES:  Pupils round and reactive to light.  Extraocular muscles intact.  Face is symmetric.  MOTOR:  Motor strength is 5/5 throughout including distally in the feet.  No pronator drift.  Tone is normal.    MSRs:  Right                                                                 Left brachioradialis 2+  brachioradialis 2+  biceps 2+  biceps 2+  triceps 2+  triceps 2+  patellar  1+  patellar 2+  ankle jerk 0  ankle jerk 0   SENSORY:  Vibration and light touch reduced distal to ankles bilaterally.  There is sway with Rhomberg testing.  COORDINATION/GAIT:   Gait wide-based, assisted with cane,  and unsteady.  LABS: Labs 03/02/2016:  TSH 3.02, Na 142, K 4.7, Glucose 94, BUN 14, Cr 0.7, LFTs normal Labs 05/06/2016:  Vitamin B12 242, folate 12.2, copper 97, SPEP with IFE no M protein  Lab Results  Component Value Date   VITAMINB12 920 02/21/2023    IMPRESSION: Peripheral neuropathy, alcohol-induced, mild progression.  Symptoms manifesting with numbness, tingling, and ataxia  - Continue gabapentin 300mg  at bedtime - refilled  - He compliant with using a walker and cane  - PT declined  - Fall precautions discussed  Return to clinic in 1 year  Thank you for allowing me to participate in patient's care.  If I can answer any additional questions, I would be pleased to do so.    Sincerely,    Jennfer Gassen K. Allena Katz, DO

## 2023-09-12 DIAGNOSIS — N139 Obstructive and reflux uropathy, unspecified: Secondary | ICD-10-CM | POA: Diagnosis not present

## 2023-09-12 DIAGNOSIS — R3914 Feeling of incomplete bladder emptying: Secondary | ICD-10-CM | POA: Diagnosis not present

## 2023-09-12 DIAGNOSIS — N312 Flaccid neuropathic bladder, not elsewhere classified: Secondary | ICD-10-CM | POA: Diagnosis not present

## 2023-09-26 DIAGNOSIS — E039 Hypothyroidism, unspecified: Secondary | ICD-10-CM | POA: Diagnosis not present

## 2023-09-26 LAB — TSH: TSH: 5.58 (ref 0.41–5.90)

## 2023-10-06 ENCOUNTER — Ambulatory Visit (HOSPITAL_BASED_OUTPATIENT_CLINIC_OR_DEPARTMENT_OTHER): Payer: PPO | Admitting: Orthopaedic Surgery

## 2023-10-06 ENCOUNTER — Ambulatory Visit (HOSPITAL_BASED_OUTPATIENT_CLINIC_OR_DEPARTMENT_OTHER): Payer: PPO

## 2023-10-06 DIAGNOSIS — M545 Low back pain, unspecified: Secondary | ICD-10-CM

## 2023-10-06 DIAGNOSIS — G8929 Other chronic pain: Secondary | ICD-10-CM

## 2023-10-06 DIAGNOSIS — M25552 Pain in left hip: Secondary | ICD-10-CM

## 2023-10-06 DIAGNOSIS — M47816 Spondylosis without myelopathy or radiculopathy, lumbar region: Secondary | ICD-10-CM | POA: Diagnosis not present

## 2023-10-06 DIAGNOSIS — M5126 Other intervertebral disc displacement, lumbar region: Secondary | ICD-10-CM | POA: Diagnosis not present

## 2023-10-06 MED ORDER — LIDOCAINE HCL 1 % IJ SOLN
4.0000 mL | INTRAMUSCULAR | Status: AC | PRN
Start: 2023-10-06 — End: 2023-10-06
  Administered 2023-10-06: 4 mL

## 2023-10-06 MED ORDER — TRIAMCINOLONE ACETONIDE 40 MG/ML IJ SUSP
80.0000 mg | INTRAMUSCULAR | Status: AC | PRN
  Administered 2023-10-06: 80 mg via INTRA_ARTICULAR

## 2023-10-06 NOTE — Progress Notes (Signed)
Chief Complaint: Left hip pain     History of Present Illness:   10/06/2023: Presents today for follow-up of his left hip and back.  He is experiencing pain deep in the groin.  He is somewhat favoring the right wrist as well as he has become more dependent on his walker on the side  David Gutierrez is a 89 y.o. male presents today with 3 to 5 years of ongoing left wrist pain that is worse at the end of the day.  He states that 3 years ago he had to stop playing golf as a result of this.  He states the end of a long day he feels weakness with grip and pain predominantly involving the distal aspect of the wrist.  He has not been taking any anti-inflammatories for this.  He is currently seeing Dr. Alcide Evener for ongoing management of thrombocytopenia.  This has been chronic in nature.     Surgical History:   None  PMH/PSH/Family History/Social History/Meds/Allergies:    Past Medical History:  Diagnosis Date   Alcoholic peripheral neuropathy [facility])    neurologist-  dr patel--- feet numbness and sensory ataxia   Arthritis    B12 deficiency    BPH (benign prostatic hyperplasia)    CAD (coronary artery disease) cardiolgoist-  dr Cristal Deer end (previouly dr dalton Shirlee Latch)   a. Myoview 11/13:  EF 38%, inf and IS defect c/w scar but no ischemia:  b. Cardiac CT 11/13:  Ca score 318 Agatson units, pLAD and dCFX plaque;   c. LHC 11/07/12:  pLAD 30%, oD1 40%, oCFX 30%, dCFX 40-50%, mRCA 40%, EF 50% (frequent PVCs and short run of NSVT with injection/    per last echo 01/ 2017  ef 55-60%   Chronic constipation    Diverticulosis of colon    Esophageal reflux    External hemorrhoids    First degree heart block    Hiatal hernia    History of adenomatous polyp of colon    History of esophageal stricture 08/11/2015   s/p  dilatation   History of lower GI bleeding 03/01/2017   s/p  flexiable simoidscopy w/ clipping rectum ulcer   Hypogonadism male    Hypothyroidism     Interstitial cystitis    Irritable bowel syndrome    LBBB (left bundle branch block)    NICM (nonischemic cardiomyopathy) [facility]) cardiologist-  dr Cristal Deer end (previously dr dalton Shirlee Latch)--  per last echo 01/ 2017  ef 55-60%   ? 2/2 LBBB => a. echo 11/13: diff HK, worse in septum and apex, mod LVE, mild LVH, EF 40%, mild AI, mild MR, mod LAE   Pre-diabetes    Self-catheterizes urinary bladder    bid to tid    Wears glasses    Wears hearing aid in both ears    Past Surgical History:  Procedure Laterality Date   CARDIAC CATHETERIZATION  11-07-2012    dr Shirlee Latch   nonobstructive CAD (pLAD 30%, ostial D1 40%, ostial LCx 30%, dLCx diffuse 40-50%, mRCA 40%) ;  LVSF 50% but diffiult due to PVCs and short run VT with injection;  cardiomyopathy mostly likely a LBBB cardiomyopathy   CARDIOVASCULAR STRESS TEST  10-16-2012   dr Shirlee Latch   Low risk adenosine nuclear study (no exercise) w/ a fixed inferior and inferoseptal defect without  ischemia (question as whether this abnormality due to LBBB cardiomyopathy or due to scar)/  LV ef 38%,  LV wall motion decreased of the septum and entire apex   CATARACT EXTRACTION W/ INTRAOCULAR LENS  IMPLANT, BILATERAL  2012  approx.   COLONOSCOPY  2016   Dr. Marina Goodell, Per new patient form   CYSTO/  HYDRODISTENTION/  BLADDER BIOPSY  04-20-2005    dr Darvin Neighbours  [facility]   ETHMOIDECTOMY Right 12/12/2016   Procedure: RIGHT ENDOSCOPIC ETHMOIDECTOMY;  Surgeon: Newman Pies, MD;  Location: [facility];  Service: ENT;  Laterality: Right;   FLEXIBLE SIGMOIDOSCOPY N/A 03/02/2017   Procedure: FLEXIBLE SIGMOIDOSCOPY;  Surgeon: Meryl Dare, MD;  Location: [facility];  Service: Endoscopy;  Laterality: N/A;   LUMBAR LAMINECTOMY  1952   MAXILLARY ANTROSTOMY Right 12/12/2016   Procedure: RIGHT ENDOSCOPIC MAXILLARY ANTROSTOMY;  Surgeon: Newman Pies, MD;  Location: [facility];  Service: ENT;  Laterality: Right;   SINUS ENDO W/FUSION Right 12/12/2016   Procedure:  ENDOSCOPIC SINUS SURGERY WITH NAVIGATION;  Surgeon: Newman Pies, MD;  Location: [facility];  Service: ENT;  Laterality: Right;   SPHENOIDECTOMY Right 12/12/2016   Procedure: RIGHT ENDOSCOPIC SPHENOIDECTOMY;  Surgeon: Newman Pies, MD;  Location: [facility];  Service: ENT;  Laterality: Right;   TONSILLECTOMY AND ADENOIDECTOMY  child   TOTAL KNEE ARTHROPLASTY Left 10/14/2014   Procedure: LEFT TOTAL KNEE ARTHROPLASTY;  Surgeon: Shelda Pal, MD;  Location: [facility];  Service: Orthopedics;  Laterality: Left;   TOTAL KNEE ARTHROPLASTY Right 1990s   TRANSTHORACIC ECHOCARDIOGRAM  12-24-2015   dr Shirlee Latch   ef 55-60%,  grade 1 diastolic dysfunction/  trivial AR and TR  mild MR and PR/  moderate LAE   TRANSURETHRAL RESECTION OF PROSTATE  06-11-2010   dr Patsi Sears  [facility]   w/  GYRUS   TRANSURETHRAL RESECTION OF PROSTATE N/A 12/06/2017   Procedure: TRANSURETHRAL RESECTION OF THE PROSTATE (TURP)/ BIPOLAR;  Surgeon: Rene Paci, MD;  Location: [facility];  Service: Urology;  Laterality: N/A;   TRANSURETHRAL RESECTION OF PROSTATE N/A 04/11/2018   Procedure: TRANSURETHRAL RESECTION OF THE PROSTATE (TURP);  Surgeon: Rene Paci, MD;  Location: [facility];  Service: Urology;  Laterality: N/A;   UPPER GASTROINTESTINAL ENDOSCOPY     Social History   Socioeconomic History   Marital status: Married    Spouse name: Not on file   Number of children: 3   Years of education: Not on file   Highest education level: Not on file  Occupational History   Occupation: retired  Tobacco Use   Smoking status: Former    Current packs/day: 0.00    Average packs/day: 2.0 packs/day for 15.0 years (30.0 ttl pk-yrs)    Types: Cigarettes    Start date: 11/29/1952    Quit date: 11/30/1967    Years since quitting: 55.8   Smokeless tobacco: Never  Vaping Use   Vaping status: Never Used  Substance and Sexual Activity   Alcohol use: Yes    Alcohol/week: 2.0 standard  drinks of alcohol    Types: 2 Standard drinks or equivalent per week    Comment: occasional   Drug use: No   Sexual activity: Yes  Other Topics Concern   Not on file  Social History Narrative   Do you drink/eat things with caffeine? Very Little    Marital status/What year were you married? Married since 1961   Do you live in a  house, apartment, assisted living, condo, trailer, etc.? Did not answer   Is it one or more stories? One   How many persons live in your home? 2    Do you have any pets in your home? No   Current or past profession: Brewing technologist.    Do you exercise? Very little    Type and how often? Did not answer    Do you have a living will? Yes   Do you have a DNR form? Did not answer   Do you have signed POA/HPOA forms? Did not answer   Social Determinants of Health   Financial Resource Strain: Not on file  Food Insecurity: Not on file  Transportation Needs: Not on file  Physical Activity: Not on file  Stress: Not on file  Social Connections: Not on file   Family History  Problem Relation Age of Onset   Healthy Mother    Healthy Father    Other Sister    Healthy Sister    Thyroid cancer Brother    Heart disease Brother    Diabetes type I Son    Colon cancer Neg Hx    Esophageal cancer Neg Hx    Stomach cancer Neg Hx    Rectal cancer Neg Hx    Allergies  Allergen Reactions   Cyclobenzaprine Other (See Comments)    Lost balance   Lisinopril Other (See Comments)    Affected ability to urinate   Relafen [Nabumetone] Other (See Comments)    Problems urinating afterwards    Statins Other (See Comments)    "my peeing"   Current Outpatient Medications  Medication Sig Dispense Refill   AZO-CRANBERRY PO Take 1 tablet by mouth daily.     carvedilol (COREG) 6.25 MG tablet TAKE 1 TABLET BY MOUTH TWICE A DAY 180 tablet 2   Cyanocobalamin (VITAMIN B12) 1000 MCG TBCR Take 1,000 mcg by mouth.     diazepam (VALIUM) 5 MG tablet Take 0.5 tablets (2.5 mg total) by  mouth 2 (two) times daily. 1/2 tablet every 2-3 days 30 tablet 1   gabapentin (NEURONTIN) 300 MG capsule TAKE 1 CAPSULE EVERY DAY 90 capsule 3   levothyroxine (SYNTHROID) 25 MCG tablet Take 1 tablet (25 mcg total) by mouth daily before breakfast. Tuesday Thursday Friday Saturday 30 tablet 3   levothyroxine (SYNTHROID) 50 MCG tablet Take 1 tablet (50 mcg total) by mouth daily before breakfast. Monday Wednesday Sunday 30 tablet 3   omeprazole (PRILOSEC) 20 MG capsule TAKE 1 CAPSULE BY MOUTH EVERY DAY 90 capsule 1   polyethylene glycol (MIRALAX / GLYCOLAX) packet Take 17 g by mouth daily as needed for mild constipation.     Probiotic Product (PROBIOTIC PO) Take 1 capsule by mouth daily.     trimethoprim (TRIMPEX) 100 MG tablet Take 100 mg by mouth 2 (two) times daily.     No current facility-administered medications for this visit.   No results found.  Review of Systems:   A ROS was performed including pertinent positives and negatives as documented in the HPI.  Physical Exam :   Constitutional: NAD and appears stated age Neurological: Alert and oriented Psych: Appropriate affect and cooperative There were no vitals taken for this visit.   Comprehensive Musculoskeletal Exam:    Tenderness to palpation about the left wrist.  This is predominantly over the distal radius.  Negative Phalen or Tinel's test over the carpal tunnel.  No tenderness about the ulnar styloid.  Tenderness about the left hip  with internal rotation of 10 degrees.  This is worse at 90 degrees flexion.  Imaging:   Xray (3 views left wrist, CT scan pelvis): There is a large lesion involving the distal radius which appears punched out in nature.  There is some small satellite areas of involvement as well near the DRUJ.  He has significant chondrocalcinosis about the wrist as well.  There is evidence of moderate left hip osteoarthritis I personally reviewed and interpreted the radiographs.   Assessment:   89 y.o. male  with left hip pain which is consistent with femoral acetabular osteoarthritis.  I did recommend ultrasound-guided injection of the left hip as he does appear to be favoring his right wrist and hand due to putting more weight on the side by eating to avoid weight on the left hip.  I did recommend an injection to hopefully get him some relief.  Will plan to proceed with this today  Plan :    -Left hip ultrasound-guided injection performed after verbal consent obtained    Procedure Note  Patient: David Gutierrez             Date of Birth: 03-03-1933           MRN: [id number]             Visit Date: 10/06/2023  Procedures: Visit Diagnoses:  1. Chronic left-sided low back pain without sciatica     Large Joint Inj: L hip joint on 10/06/2023 12:51 PM Indications: pain Details: 22 G 3.5 in needle, ultrasound-guided anterolateral approach  Arthrogram: No  Medications: 4 mL lidocaine 1 %; 80 mg triamcinolone acetonide 40 MG/ML Outcome: tolerated well, no immediate complications Procedure, treatment alternatives, risks and benefits explained, specific risks discussed. Consent was given by the patient. Immediately prior to procedure a time out was called to verify the correct patient, procedure, equipment, support staff and site/side marked as required. Patient was prepped and draped in the usual sterile fashion.           I personally saw and evaluated the patient, and participated in the management and treatment plan.  Huel Cote, MD Attending Physician, Orthopedic Surgery  This document was dictated using Dragon voice recognition software. A reasonable attempt at proof reading has been made to minimize errors.

## 2023-10-10 ENCOUNTER — Other Ambulatory Visit: Payer: Self-pay | Admitting: Adult Health

## 2023-10-10 DIAGNOSIS — E039 Hypothyroidism, unspecified: Secondary | ICD-10-CM

## 2023-10-10 NOTE — Telephone Encounter (Signed)
  Pharmacy comment: Product Backordered/Unavailable:MYLAN BRAND LEVOTHYROXINE IS NOT COMING IN. IS IT OK TO SWITCH TO AMNEAL BRAND?

## 2023-10-12 DIAGNOSIS — N312 Flaccid neuropathic bladder, not elsewhere classified: Secondary | ICD-10-CM | POA: Diagnosis not present

## 2023-10-12 DIAGNOSIS — N139 Obstructive and reflux uropathy, unspecified: Secondary | ICD-10-CM | POA: Diagnosis not present

## 2023-10-12 DIAGNOSIS — R3914 Feeling of incomplete bladder emptying: Secondary | ICD-10-CM | POA: Diagnosis not present

## 2023-10-30 DIAGNOSIS — R1011 Right upper quadrant pain: Secondary | ICD-10-CM | POA: Diagnosis not present

## 2023-10-30 DIAGNOSIS — N3020 Other chronic cystitis without hematuria: Secondary | ICD-10-CM | POA: Diagnosis not present

## 2023-10-30 DIAGNOSIS — N312 Flaccid neuropathic bladder, not elsewhere classified: Secondary | ICD-10-CM | POA: Diagnosis not present

## 2023-11-05 DIAGNOSIS — N312 Flaccid neuropathic bladder, not elsewhere classified: Secondary | ICD-10-CM | POA: Diagnosis not present

## 2023-11-05 DIAGNOSIS — N139 Obstructive and reflux uropathy, unspecified: Secondary | ICD-10-CM | POA: Diagnosis not present

## 2023-11-05 DIAGNOSIS — R3914 Feeling of incomplete bladder emptying: Secondary | ICD-10-CM | POA: Diagnosis not present

## 2023-11-20 DIAGNOSIS — K8020 Calculus of gallbladder without cholecystitis without obstruction: Secondary | ICD-10-CM | POA: Diagnosis not present

## 2023-11-20 DIAGNOSIS — K5730 Diverticulosis of large intestine without perforation or abscess without bleeding: Secondary | ICD-10-CM | POA: Diagnosis not present

## 2023-11-20 DIAGNOSIS — K862 Cyst of pancreas: Secondary | ICD-10-CM | POA: Diagnosis not present

## 2023-11-20 DIAGNOSIS — N3000 Acute cystitis without hematuria: Secondary | ICD-10-CM | POA: Diagnosis not present

## 2023-11-20 DIAGNOSIS — N3090 Cystitis, unspecified without hematuria: Secondary | ICD-10-CM | POA: Diagnosis not present

## 2023-11-21 ENCOUNTER — Other Ambulatory Visit: Payer: Self-pay | Admitting: Internal Medicine

## 2023-11-22 NOTE — Telephone Encounter (Signed)
Pharmacy requested refill.  Epic LR 07/25/2023 Contract Date: 07/31/2023  Pended Rx and sent to Dr. Chales Abrahams for approval.

## 2023-12-05 ENCOUNTER — Non-Acute Institutional Stay: Payer: PPO | Admitting: Internal Medicine

## 2023-12-05 ENCOUNTER — Encounter: Payer: Self-pay | Admitting: Internal Medicine

## 2023-12-05 VITALS — BP 120/76 | HR 73 | Temp 97.8°F | Resp 17 | Ht 71.0 in | Wt 184.4 lb

## 2023-12-05 DIAGNOSIS — E039 Hypothyroidism, unspecified: Secondary | ICD-10-CM | POA: Diagnosis not present

## 2023-12-05 DIAGNOSIS — K5901 Slow transit constipation: Secondary | ICD-10-CM | POA: Diagnosis not present

## 2023-12-05 DIAGNOSIS — R053 Chronic cough: Secondary | ICD-10-CM

## 2023-12-05 DIAGNOSIS — G609 Hereditary and idiopathic neuropathy, unspecified: Secondary | ICD-10-CM | POA: Diagnosis not present

## 2023-12-05 DIAGNOSIS — R1310 Dysphagia, unspecified: Secondary | ICD-10-CM

## 2023-12-05 MED ORDER — DOXYCYCLINE HYCLATE 100 MG PO TABS
100.0000 mg | ORAL_TABLET | Freq: Two times a day (BID) | ORAL | 0 refills | Status: DC
Start: 2023-12-05 — End: 2024-02-06

## 2023-12-05 MED ORDER — LEVOTHYROXINE SODIUM 25 MCG PO TABS: 25.0000 ug | ORAL_TABLET | Freq: Every day | ORAL | 6 refills | Status: DC

## 2023-12-05 NOTE — Patient Instructions (Signed)
Try Senna Plus 2 tabs at night for constipation

## 2023-12-06 DIAGNOSIS — R3914 Feeling of incomplete bladder emptying: Secondary | ICD-10-CM | POA: Diagnosis not present

## 2023-12-06 DIAGNOSIS — N139 Obstructive and reflux uropathy, unspecified: Secondary | ICD-10-CM | POA: Diagnosis not present

## 2023-12-06 DIAGNOSIS — N312 Flaccid neuropathic bladder, not elsewhere classified: Secondary | ICD-10-CM | POA: Diagnosis not present

## 2023-12-08 NOTE — Progress Notes (Addendum)
 Location:  [facility] of Service:  Clinic (12)  Provider:   Code Status: DNR Goals of Care:     12/05/2023   10:45 AM  Advanced Directives  Does Patient Have a Medical Advance Directive? Yes  Type of Estate Agent of [place];Living will;Out of facility DNR (pink MOST or yellow form)  Does patient want to make changes to medical advance directive? No - Patient declined     Chief Complaint  Patient presents with   Medical Management of Chronic Issues    Patient is being seen for a 4 month follow up. Discuss ct scan    HPI: Patient is a 89 y.o. male seen today for medical management of chronic diseases.    [place] in [place] in [place] Acute issue  Productive cough  Patient had CT scan of his Chest and Abdomen done by Urology and it showed Anterior Right Lower  lobe infiltrate ? Due to chronic Aspiration He does have h/o Productive Cough for many months Of and On No Chest Pain. No Fever  SOB with exertion He does have h/o Dysphagia in the past s/p Dilatation in 2021 Denies any swallowing issue right now  Irregular Bowel movements with incomplete Rectal Evacuation   The patient's primary concern is irregular bowel movements, which he describe as an inconvenience that restricts their activities. He is using Miralax  to manage this issue, The patient also mentions manually removing stool on occasion, suggesting possible rectal dysfunction.   His  neuropathy is described as severe, but maintain mobility with the aid of a cane    Has a history of nonobstructive CAD, nonischemic cardiomyopathy, left bundle branch block and HLD Last echo showed EF of 55 to 60% with mild MR Follows with Cardiology   History of flaccid neurogenic bladder  catheterize at least once or twice in a day Recurrent UTIs follows with urology     History of insomnia with restless leg.  Is on Valium  and Tylenol  PM Pancreatic Cyst CT imaging Stable Recommended  No Follow up needed Thrombocytopenia Saw Hematology   Past Medical History:  Diagnosis Date   Alcoholic peripheral neuropathy [facility])    neurologist-  dr patel--- feet numbness and sensory ataxia   Arthritis    B12 deficiency    BPH (benign prostatic hyperplasia)    CAD (coronary artery disease) cardiolgoist-  dr lonni end (previouly dr dalton rolan)   a. Myoview 11/13:  EF 38%, inf and IS defect c/w scar but no ischemia:  b. Cardiac CT 11/13:  Ca score 318 Agatson units, pLAD and dCFX plaque;   c. LHC 11/07/12:  pLAD 30%, oD1 40%, oCFX 30%, dCFX 40-50%, mRCA 40%, EF 50% (frequent PVCs and short run of NSVT with injection/    per last echo 01/ 2017  ef 55-60%   Chronic constipation    Diverticulosis of colon    Esophageal reflux    External hemorrhoids    First degree heart block    Hiatal hernia    History of adenomatous polyp of colon    History of esophageal stricture 08/11/2015   s/p  dilatation   History of lower GI bleeding 03/01/2017   s/p  flexiable simoidscopy w/ clipping rectum ulcer   Hypogonadism male    Hypothyroidism    Interstitial cystitis    Irritable bowel syndrome    LBBB (left bundle branch block)    NICM (nonischemic cardiomyopathy) [facility]) cardiologist-  dr lonni end (previously dr dalton  mclean)--  per last echo 01/ 2017  ef 55-60%   ? 2/2 LBBB => a. echo 11/13: diff HK, worse in septum and apex, mod LVE, mild LVH, EF 40%, mild AI, mild MR, mod LAE   Pre-diabetes    Self-catheterizes urinary bladder    bid to tid    Wears glasses    Wears hearing aid in both ears     Past Surgical History:  Procedure Laterality Date   CARDIAC CATHETERIZATION  11-07-2012    dr rolan   nonobstructive CAD (pLAD 30%, ostial D1 40%, ostial LCx 30%, dLCx diffuse 40-50%, mRCA 40%) ;  LVSF 50% but diffiult due to PVCs and short run VT with injection;  cardiomyopathy mostly likely a LBBB cardiomyopathy   CARDIOVASCULAR STRESS TEST  10-16-2012   dr rolan   Low risk  adenosine  nuclear study (no exercise) w/ a fixed inferior and inferoseptal defect without ischemia (question as whether this abnormality due to LBBB cardiomyopathy or due to scar)/  LV ef 38%,  LV wall motion decreased of the septum and entire apex   CATARACT EXTRACTION W/ INTRAOCULAR LENS  IMPLANT, BILATERAL  2012  approx.   COLONOSCOPY  2016   Dr. Abran, Per new patient form   CYSTO/  HYDRODISTENTION/  BLADDER BIOPSY  04-20-2005    dr renay moats  [facility]   ETHMOIDECTOMY Right 12/12/2016   Procedure: RIGHT ENDOSCOPIC ETHMOIDECTOMY;  Surgeon: Daniel Moccasin, MD;  Location: [facility];  Service: ENT;  Laterality: Right;   FLEXIBLE SIGMOIDOSCOPY N/A 03/02/2017   Procedure: FLEXIBLE SIGMOIDOSCOPY;  Surgeon: Gwendlyn ONEIDA Buddy, MD;  Location: [facility];  Service: Endoscopy;  Laterality: N/A;   LUMBAR LAMINECTOMY  1952   MAXILLARY ANTROSTOMY Right 12/12/2016   Procedure: RIGHT ENDOSCOPIC MAXILLARY ANTROSTOMY;  Surgeon: Daniel Moccasin, MD;  Location: [facility];  Service: ENT;  Laterality: Right;   SINUS ENDO W/FUSION Right 12/12/2016   Procedure: ENDOSCOPIC SINUS SURGERY WITH NAVIGATION;  Surgeon: Daniel Moccasin, MD;  Location: [facility];  Service: ENT;  Laterality: Right;   SPHENOIDECTOMY Right 12/12/2016   Procedure: RIGHT ENDOSCOPIC SPHENOIDECTOMY;  Surgeon: Daniel Moccasin, MD;  Location: [facility];  Service: ENT;  Laterality: Right;   TONSILLECTOMY AND ADENOIDECTOMY  child   TOTAL KNEE ARTHROPLASTY Left 10/14/2014   Procedure: LEFT TOTAL KNEE ARTHROPLASTY;  Surgeon: Donnice JONETTA Car, MD;  Location: [facility];  Service: Orthopedics;  Laterality: Left;   TOTAL KNEE ARTHROPLASTY Right 1990s   TRANSTHORACIC ECHOCARDIOGRAM  12-24-2015   dr rolan   ef 55-60%,  grade 1 diastolic dysfunction/  trivial AR and TR  mild MR and PR/  moderate LAE   TRANSURETHRAL RESECTION OF PROSTATE  06-11-2010   dr chales  [facility]   w/  GYRUS   TRANSURETHRAL RESECTION OF PROSTATE N/A 12/06/2017    Procedure: TRANSURETHRAL RESECTION OF THE PROSTATE (TURP)/ BIPOLAR;  Surgeon: Devere Lonni Righter, MD;  Location: [facility];  Service: Urology;  Laterality: N/A;   TRANSURETHRAL RESECTION OF PROSTATE N/A 04/11/2018   Procedure: TRANSURETHRAL RESECTION OF THE PROSTATE (TURP);  Surgeon: Devere Lonni Righter, MD;  Location: [facility];  Service: Urology;  Laterality: N/A;   UPPER GASTROINTESTINAL ENDOSCOPY      Allergies  Allergen Reactions   Cyclobenzaprine Other (See Comments)    Lost balance   Lisinopril  Other (See Comments)    Affected ability to urinate   Relafen [Nabumetone] Other (See Comments)    Problems urinating afterwards  Statins Other (See Comments)    my peeing    Outpatient Encounter Medications as of 12/05/2023  Medication Sig   AZO-CRANBERRY PO Take 1 tablet by mouth daily.   carvedilol  (COREG ) 6.25 MG tablet TAKE 1 TABLET BY MOUTH TWICE A DAY   Cyanocobalamin  (VITAMIN B12) 1000 MCG TBCR Take 1,000 mcg by mouth.   diazepam  (VALIUM ) 5 MG tablet TAKE 0.5 TABLETS (2.5 MG TOTAL) BY MOUTH 2 (TWO) TIMES DAILY. 1/2 TABLET EVERY 2-3 DAYS   doxycycline  (VIBRA -TABS) 100 MG tablet Take 1 tablet (100 mg total) by mouth 2 (two) times daily.   gabapentin  (NEURONTIN ) 300 MG capsule TAKE 1 CAPSULE EVERY DAY   levothyroxine  (SYNTHROID ) 50 MCG tablet TAKE 1 TABLET (50 MCG TOTAL) BY MOUTH DAILY BEFORE BREAKFAST. MONDAY WEDNESDAY SUNDAY   omeprazole  (PRILOSEC) 20 MG capsule TAKE 1 CAPSULE BY MOUTH EVERY DAY   polyethylene glycol (MIRALAX  / GLYCOLAX ) packet Take 17 g by mouth daily as needed for mild constipation.   Probiotic Product (PROBIOTIC PO) Take 1 capsule by mouth daily.   trimethoprim  (TRIMPEX ) 100 MG tablet Take 100 mg by mouth 2 (two) times daily.   [DISCONTINUED] levothyroxine  (SYNTHROID ) 25 MCG tablet Take 1 tablet (25 mcg total) by mouth daily before breakfast. Tuesday Thursday Friday Saturday   levothyroxine  (SYNTHROID ) 25 MCG tablet Take 1  tablet (25 mcg total) by mouth daily before breakfast. Tuesday Thursday Friday Saturday   No facility-administered encounter medications on file as of 12/05/2023.    Review of Systems:  Review of Systems  Constitutional:  Negative for activity change, appetite change and unexpected weight change.  HENT: Negative.    Respiratory:  Positive for cough. Negative for shortness of breath.   Cardiovascular:  Negative for leg swelling.  Gastrointestinal:  Positive for constipation.  Genitourinary:  Positive for difficulty urinating. Negative for frequency.  Musculoskeletal:  Positive for gait problem. Negative for arthralgias and myalgias.  Skin: Negative.  Negative for rash.  Neurological:  Negative for dizziness and weakness.  Psychiatric/Behavioral:  Negative for confusion and sleep disturbance.   All other systems reviewed and are negative.   Health Maintenance  Topic Date Due   COVID-19 Vaccine (6 - 2024-25 season) 08/06/2023   Medicare Annual Wellness (AWV)  04/09/2024   DTaP/Tdap/Td (5 - Td or Tdap) 12/06/2027   Pneumonia Vaccine 61+ Years old  Completed   INFLUENZA VACCINE  Completed   HPV VACCINES  Aged Out   Colonoscopy  Discontinued   Zoster Vaccines- Shingrix  Discontinued    Physical Exam: Vitals:   12/05/23 1044  BP: 120/76  Pulse: 73  Resp: 17  Temp: 97.8 F (36.6 C)  TempSrc: Temporal  SpO2: 98%  Weight: 184 lb 6.4 oz (83.6 kg)  Height: 5' 11 (1.803 m)   Body mass index is 25.72 kg/m. Physical Exam Vitals reviewed.  Constitutional:      Appearance: Normal appearance.  HENT:     Head: Normocephalic.     Nose: Nose normal.     Mouth/Throat:     Mouth: Mucous membranes are moist.     Pharynx: Oropharynx is clear.  Eyes:     Pupils: Pupils are equal, round, and reactive to light.  Cardiovascular:     Rate and Rhythm: Normal rate and regular rhythm.     Pulses: Normal pulses.     Heart sounds: No murmur heard. Pulmonary:     Effort: Pulmonary effort  is normal. No respiratory distress.     Breath sounds: Normal breath  sounds. No rales.  Abdominal:     General: Abdomen is flat. Bowel sounds are normal.     Palpations: Abdomen is soft.  Musculoskeletal:        General: Swelling present.     Cervical back: Neck supple.  Skin:    General: Skin is warm.  Neurological:     Mental Status: He is alert and oriented to person, place, and time.  Psychiatric:        Mood and Affect: Mood normal.        Thought Content: Thought content normal.     Labs reviewed: Basic Metabolic Panel: Recent Labs    02/21/23 0000 04/25/23 0000 07/06/23 1123 07/25/23 0000  NA 141  --  138  --   K 4.7  --  4.3  --   CL 104  --  104  --   CO2 28*  --  29  --   GLUCOSE  --   --  90  --   BUN 11  --  11  --   CREATININE 0.6  --  0.62  --   CALCIUM  9.3  --  9.1  --   TSH 9.14* 6.87*  --  8.70*   Liver Function Tests: Recent Labs    02/21/23 0000 07/06/23 1123  AST 26 14*  ALT 15 8  ALKPHOS 85 67  BILITOT  --  0.9  PROT  --  6.3*  ALBUMIN 4.1 4.0   No results for input(s): LIPASE, AMYLASE in the last 8760 hours. No results for input(s): AMMONIA in the last 8760 hours. CBC: Recent Labs    01/05/23 1116 02/21/23 0000 07/06/23 1123  WBC 4.0 4.2 3.9*  NEUTROABS 1.8  --  1.8  HGB 13.0 12.6* 12.2*  HCT 40.6 38* 37.8*  MCV 95.8  --  94.5  PLT 94* 112* 106*   Lipid Panel: No results for input(s): CHOL, HDL, LDLCALC, TRIG, CHOLHDL, LDLDIRECT in the last 8760 hours. Lab Results  Component Value Date   HGBA1C 5.2 04/10/2018    Procedures since last visit: No results found.  Assessment/Plan 1. Hypothyroidism, unspecified type Wants to take two  25 mcg instead of 50 mcg alternating  - levothyroxine  (SYNTHROID ) 25 MCG tablet; Take 1 tablet (25 mcg total) by mouth daily before breakfast. Tuesday Thursday Friday Saturday  Dispense: 90 tablet; Refill: 6  2. Peripheral neuropathy, idiopathic (Primary) Sees Dr  patel Gabapentin   3. Chronic cough ? Aspiration Pneumonia  in CT scan Will try Doxycyline for 10 day course Repeat CT in 6 weeks to follow the infiltrate   4. Slow transit constipation Possible Incomplete rectal emptying Added Senna at night Is effecting his Quality of life and scoial life   - Ambulatory referral to Gastroenterology  5Thrombocytopenia [facility]) Follows with Hematology 6 NICM (nonischemic cardiomyopathy) (HCC) Aspirin  and Coreg  7 GERD Prilosec 8 Flaccid neuropathic bladder, not elsewhere classified Uses In and Out cath  Trimpex   9 Alcohol  drinking problem Recounseelled to cut back is Alcohol  intake 10 Left Wrist pain Seen Orth  11  Pancreatic cyst No Folloow up needed Labs/tests ordered:  * No order type specified * Next appt:  02/06/2024

## 2023-12-13 DIAGNOSIS — L820 Inflamed seborrheic keratosis: Secondary | ICD-10-CM | POA: Diagnosis not present

## 2023-12-13 DIAGNOSIS — L570 Actinic keratosis: Secondary | ICD-10-CM | POA: Diagnosis not present

## 2023-12-13 DIAGNOSIS — L821 Other seborrheic keratosis: Secondary | ICD-10-CM | POA: Diagnosis not present

## 2023-12-13 DIAGNOSIS — Z85828 Personal history of other malignant neoplasm of skin: Secondary | ICD-10-CM | POA: Diagnosis not present

## 2023-12-13 DIAGNOSIS — D692 Other nonthrombocytopenic purpura: Secondary | ICD-10-CM | POA: Diagnosis not present

## 2024-01-03 ENCOUNTER — Ambulatory Visit: Payer: PPO | Admitting: Gastroenterology

## 2024-01-09 DIAGNOSIS — N139 Obstructive and reflux uropathy, unspecified: Secondary | ICD-10-CM | POA: Diagnosis not present

## 2024-01-09 DIAGNOSIS — N312 Flaccid neuropathic bladder, not elsewhere classified: Secondary | ICD-10-CM | POA: Diagnosis not present

## 2024-01-09 DIAGNOSIS — R3914 Feeling of incomplete bladder emptying: Secondary | ICD-10-CM | POA: Diagnosis not present

## 2024-01-29 DIAGNOSIS — N3020 Other chronic cystitis without hematuria: Secondary | ICD-10-CM | POA: Diagnosis not present

## 2024-01-29 DIAGNOSIS — N312 Flaccid neuropathic bladder, not elsewhere classified: Secondary | ICD-10-CM | POA: Diagnosis not present

## 2024-02-03 DIAGNOSIS — R3914 Feeling of incomplete bladder emptying: Secondary | ICD-10-CM | POA: Diagnosis not present

## 2024-02-03 DIAGNOSIS — N139 Obstructive and reflux uropathy, unspecified: Secondary | ICD-10-CM | POA: Diagnosis not present

## 2024-02-03 DIAGNOSIS — N312 Flaccid neuropathic bladder, not elsewhere classified: Secondary | ICD-10-CM | POA: Diagnosis not present

## 2024-02-06 ENCOUNTER — Non-Acute Institutional Stay: Payer: PPO | Admitting: Internal Medicine

## 2024-02-06 ENCOUNTER — Encounter: Payer: Self-pay | Admitting: Internal Medicine

## 2024-02-06 VITALS — BP 122/72 | HR 69 | Temp 97.6°F | Resp 16 | Ht 71.0 in | Wt 181.4 lb

## 2024-02-06 DIAGNOSIS — K219 Gastro-esophageal reflux disease without esophagitis: Secondary | ICD-10-CM | POA: Diagnosis not present

## 2024-02-06 DIAGNOSIS — D696 Thrombocytopenia, unspecified: Secondary | ICD-10-CM

## 2024-02-06 DIAGNOSIS — K5901 Slow transit constipation: Secondary | ICD-10-CM | POA: Diagnosis not present

## 2024-02-06 DIAGNOSIS — I428 Other cardiomyopathies: Secondary | ICD-10-CM | POA: Diagnosis not present

## 2024-02-06 DIAGNOSIS — R053 Chronic cough: Secondary | ICD-10-CM

## 2024-02-06 DIAGNOSIS — N138 Other obstructive and reflux uropathy: Secondary | ICD-10-CM

## 2024-02-06 DIAGNOSIS — E538 Deficiency of other specified B group vitamins: Secondary | ICD-10-CM

## 2024-02-06 DIAGNOSIS — G609 Hereditary and idiopathic neuropathy, unspecified: Secondary | ICD-10-CM | POA: Diagnosis not present

## 2024-02-06 DIAGNOSIS — N401 Enlarged prostate with lower urinary tract symptoms: Secondary | ICD-10-CM | POA: Diagnosis not present

## 2024-02-06 DIAGNOSIS — R1310 Dysphagia, unspecified: Secondary | ICD-10-CM | POA: Diagnosis not present

## 2024-02-06 DIAGNOSIS — E039 Hypothyroidism, unspecified: Secondary | ICD-10-CM

## 2024-02-06 NOTE — Progress Notes (Signed)
 Location:  [facility] features editor of Service:  Clinic (12)  Provider:   Code Status:  Goals of Care:     02/06/2024   11:11 AM  Advanced Directives  Does Patient Have a Medical Advance Directive? Yes  Type of Estate agent of [place];Living will;Out of facility DNR (pink MOST or yellow form)  Does patient want to make changes to medical advance directive? No - Patient declined  Copy of Healthcare Power of Attorney in Chart? No - copy requested     Chief Complaint  Patient presents with   Medical Management of Chronic Issues    Patient is being seen for a 2 month follow up.   Immunizations    Patient is due for a covid vaccine     HPI: Patient is a 89 y.o. male seen today for medical management of chronic diseases.    [place] in [place]      Discussed the use of AI scribe software for clinical note transcription with the patient, who gave verbal consent to proceed.  History of Present Illness   The patient, with a history of constipation and cough, presents for follow-up.  Productive cough  Patient had CT scan of his Chest and Abdomen done by Urology and it showed Anterior Right Lower  lobe infiltrate ? Due to chronic Aspiration Treated with Doxycyline for 10 days  The patient's cough has improved but persists, particularly after eating, during which he coughs up a significant amount of phlegm. He reports that certain foods, such as peanuts, exacerbate the cough. He has a history of food entering his lungs,  He reports that the antibiotics improved his cough, but it has not completely resolved. Irregular Bowel movements with incomplete Rectal Evacuation   He reports difficulty managing his bowel movements with the previously recommended [place], as taking two daily resulted in excessive bowel movements that restricted his ability to leave the house. He has since adjusted his regimen to taking one or two colon cleansers  before bed on days when he has no plans for the following day. In the morning, he takes a cup of Miralax and typically has a bowel movement by 11 o'clock. He reports this regimen has improved his bowel regularity, but plans to discuss further with his gastroenterology nurse practitioner. Dysphagia Also Report of Esophageal Dysphagia Has h/o Dilatation in 2021 Says has to [place] food for long time and then has to drink water to help  The patient also reports worsening short-term memory, which he plans to discuss at his next appointment.  His  neuropathy is described as severe, but maintain mobility with the aid of a cane    He continues to drive, but only for short distances and avoids highways.     Has a history of nonobstructive CAD, nonischemic cardiomyopathy, left bundle branch block and HLD Last echo showed EF of 55 to 60% with mild MR Follows with Cardiology History of flaccid neurogenic bladder  catheterize at least once or twice in a day Recurrent UTIs follows with urology     History of insomnia with restless leg.  Is on Valium and Tylenol PM Pancreatic Cyst CT imaging Stable Recommended No Follow up needed Thrombocytopenia Saw Hematology  Past Medical History:  Diagnosis Date   Alcoholic peripheral neuropathy [facility])    neurologist-  dr patel--- feet numbness and sensory ataxia   Arthritis    B12 deficiency    BPH (benign prostatic hyperplasia)  CAD (coronary artery disease) cardiolgoist-  dr Cristal Deer end (previouly dr dalton Shirlee Latch)   a. Myoview 11/13:  EF 38%, inf and IS defect c/w scar but no ischemia:  b. Cardiac CT 11/13:  Ca score 318 Agatson units, pLAD and dCFX plaque;   c. LHC 11/07/12:  pLAD 30%, oD1 40%, oCFX 30%, dCFX 40-50%, mRCA 40%, EF 50% (frequent PVCs and short run of NSVT with injection/    per last echo 01/ 2017  ef 55-60%   Chronic constipation    Diverticulosis of colon    Esophageal reflux    External hemorrhoids    First degree heart block     Hiatal hernia    History of adenomatous polyp of colon    History of esophageal stricture 08/11/2015   s/p  dilatation   History of lower GI bleeding 03/01/2017   s/p  flexiable simoidscopy w/ clipping rectum ulcer   Hypogonadism male    Hypothyroidism    Interstitial cystitis    Irritable bowel syndrome    LBBB (left bundle branch block)    NICM (nonischemic cardiomyopathy) [facility]) cardiologist-  dr Cristal Deer end (previously dr dalton Shirlee Latch)--  per last echo 01/ 2017  ef 55-60%   ? 2/2 LBBB => a. echo 11/13: diff HK, worse in septum and apex, mod LVE, mild LVH, EF 40%, mild AI, mild MR, mod LAE   Pre-diabetes    Self-catheterizes urinary bladder    bid to tid    Wears glasses    Wears hearing aid in both ears     Past Surgical History:  Procedure Laterality Date   CARDIAC CATHETERIZATION  11-07-2012    dr Shirlee Latch   nonobstructive CAD (pLAD 30%, ostial D1 40%, ostial LCx 30%, dLCx diffuse 40-50%, mRCA 40%) ;  LVSF 50% but diffiult due to PVCs and short run VT with injection;  cardiomyopathy mostly likely a LBBB cardiomyopathy   CARDIOVASCULAR STRESS TEST  10-16-2012   dr Shirlee Latch   Low risk adenosine nuclear study (no exercise) w/ a fixed inferior and inferoseptal defect without ischemia (question as whether this abnormality due to LBBB cardiomyopathy or due to scar)/  LV ef 38%,  LV wall motion decreased of the septum and entire apex   CATARACT EXTRACTION W/ INTRAOCULAR LENS  IMPLANT, BILATERAL  2012  approx.   COLONOSCOPY  2016   Dr. Marina Goodell, Per new patient form   CYSTO/  HYDRODISTENTION/  BLADDER BIOPSY  04-20-2005    dr Darvin Neighbours  [facility]   ETHMOIDECTOMY Right 12/12/2016   Procedure: RIGHT ENDOSCOPIC ETHMOIDECTOMY;  Surgeon: Newman Pies, MD;  Location: [facility];  Service: ENT;  Laterality: Right;   FLEXIBLE SIGMOIDOSCOPY N/A 03/02/2017   Procedure: FLEXIBLE SIGMOIDOSCOPY;  Surgeon: Meryl Dare, MD;  Location: [facility];  Service: Endoscopy;  Laterality: N/A;    LUMBAR LAMINECTOMY  1952   MAXILLARY ANTROSTOMY Right 12/12/2016   Procedure: RIGHT ENDOSCOPIC MAXILLARY ANTROSTOMY;  Surgeon: Newman Pies, MD;  Location: [facility];  Service: ENT;  Laterality: Right;   SINUS ENDO W/FUSION Right 12/12/2016   Procedure: ENDOSCOPIC SINUS SURGERY WITH NAVIGATION;  Surgeon: Newman Pies, MD;  Location: [facility];  Service: ENT;  Laterality: Right;   SPHENOIDECTOMY Right 12/12/2016   Procedure: RIGHT ENDOSCOPIC SPHENOIDECTOMY;  Surgeon: Newman Pies, MD;  Location:  SURGERY CENTER;  Service: ENT;  Laterality: Right;   TONSILLECTOMY AND ADENOIDECTOMY  child   TOTAL KNEE ARTHROPLASTY Left 10/14/2014   Procedure: LEFT TOTAL KNEE  ARTHROPLASTY;  Surgeon: Shelda Pal, MD;  Location: [facility];  Service: Orthopedics;  Laterality: Left;   TOTAL KNEE ARTHROPLASTY Right 1990s   TRANSTHORACIC ECHOCARDIOGRAM  12-24-2015   dr Shirlee Latch   ef 55-60%,  grade 1 diastolic dysfunction/  trivial AR and TR  mild MR and PR/  moderate LAE   TRANSURETHRAL RESECTION OF PROSTATE  06-11-2010   dr Patsi Sears  [facility]   w/  GYRUS   TRANSURETHRAL RESECTION OF PROSTATE N/A 12/06/2017   Procedure: TRANSURETHRAL RESECTION OF THE PROSTATE (TURP)/ BIPOLAR;  Surgeon: Rene Paci, MD;  Location: [facility];  Service: Urology;  Laterality: N/A;   TRANSURETHRAL RESECTION OF PROSTATE N/A 04/11/2018   Procedure: TRANSURETHRAL RESECTION OF THE PROSTATE (TURP);  Surgeon: Rene Paci, MD;  Location: [facility];  Service: Urology;  Laterality: N/A;   UPPER GASTROINTESTINAL ENDOSCOPY      Allergies  Allergen Reactions   Cyclobenzaprine Other (See Comments)    Lost balance   Lisinopril Other (See Comments)    Affected ability to urinate   Relafen [Nabumetone] Other (See Comments)    Problems urinating afterwards    Statins Other (See Comments)    "my peeing"    Outpatient Encounter Medications as of 02/06/2024  Medication Sig    AZO-CRANBERRY PO Take 1 tablet by mouth daily.   carvedilol (COREG) 6.25 MG tablet TAKE 1 TABLET BY MOUTH TWICE A DAY   Cyanocobalamin (VITAMIN B12) 1000 MCG TBCR Take 1,000 mcg by mouth.   diazepam (VALIUM) 5 MG tablet TAKE 0.5 TABLETS (2.5 MG TOTAL) BY MOUTH 2 (TWO) TIMES DAILY. 1/2 TABLET EVERY 2-3 DAYS   gabapentin (NEURONTIN) 300 MG capsule TAKE 1 CAPSULE EVERY DAY   levothyroxine (SYNTHROID) 25 MCG tablet Take 1 tablet (25 mcg total) by mouth daily before breakfast. Tuesday Thursday Friday Saturday   levothyroxine (SYNTHROID) 50 MCG tablet TAKE 1 TABLET (50 MCG TOTAL) BY MOUTH DAILY BEFORE BREAKFAST. MONDAY WEDNESDAY SUNDAY   omeprazole (PRILOSEC) 20 MG capsule TAKE 1 CAPSULE BY MOUTH EVERY DAY   polyethylene glycol (MIRALAX / GLYCOLAX) packet Take 17 g by mouth daily as needed for mild constipation.   Probiotic Product (PROBIOTIC PO) Take 1 capsule by mouth daily.   trimethoprim (TRIMPEX) 100 MG tablet Take 100 mg by mouth 2 (two) times daily.   [DISCONTINUED] doxycycline (VIBRA-TABS) 100 MG tablet Take 1 tablet (100 mg total) by mouth 2 (two) times daily.   No facility-administered encounter medications on file as of 02/06/2024.    Review of Systems:  Review of Systems  Constitutional:  Negative for activity change, appetite change and unexpected weight change.  HENT: Negative.    Respiratory:  Positive for cough. Negative for shortness of breath.   Cardiovascular:  Negative for leg swelling.  Gastrointestinal:  Positive for constipation.  Genitourinary:  Negative for frequency.  Musculoskeletal:  Positive for gait problem. Negative for arthralgias and myalgias.  Skin: Negative.  Negative for rash.  Neurological:  Positive for weakness. Negative for dizziness.  Psychiatric/Behavioral:  Negative for confusion and sleep disturbance.   All other systems reviewed and are negative.   Health Maintenance  Topic Date Due   COVID-19 Vaccine (6 - 2024-25 season) 02/22/2024 (Originally  08/06/2023)   Medicare Annual Wellness (AWV)  04/09/2024   DTaP/Tdap/Td (5 - Td or Tdap) 12/06/2027   Pneumonia Vaccine 42+ Years old  Completed   INFLUENZA VACCINE  Completed   HPV VACCINES  Aged Out   Colonoscopy  Discontinued  Zoster Vaccines- Shingrix  Discontinued    Physical Exam: Vitals:   02/06/24 1108  BP: 122/72  Pulse: 69  Resp: 16  Temp: 97.6 F (36.4 C)  SpO2: 99%  Weight: 181 lb 6.4 oz (82.3 kg)  Height: 5\' 11"  (1.803 m)   Body mass index is 25.3 kg/m. Physical Exam Vitals reviewed.  Constitutional:      Appearance: Normal appearance.  HENT:     Head: Normocephalic.     Nose: Nose normal.     Mouth/Throat:     Mouth: Mucous membranes are moist.     Pharynx: Oropharynx is clear.  Eyes:     Pupils: Pupils are equal, round, and reactive to light.  Cardiovascular:     Rate and Rhythm: Normal rate and regular rhythm.     Pulses: Normal pulses.     Heart sounds: No murmur heard. Pulmonary:     Effort: Pulmonary effort is normal. No respiratory distress.     Breath sounds: Normal breath sounds. No rales.  Abdominal:     General: Abdomen is flat. Bowel sounds are normal.     Palpations: Abdomen is soft.  Musculoskeletal:        General: No swelling.     Cervical back: Neck supple.  Skin:    General: Skin is warm.  Neurological:     General: No focal deficit present.     Mental Status: He is alert and oriented to person, place, and time.  Psychiatric:        Mood and Affect: Mood normal.        Thought Content: Thought content normal.     Labs reviewed: Basic Metabolic Panel: Recent Labs    02/21/23 0000 04/25/23 0000 07/06/23 1123 07/25/23 0000  NA 141  --  138  --   K 4.7  --  4.3  --   CL 104  --  104  --   CO2 28*  --  29  --   GLUCOSE  --   --  90  --   BUN 11  --  11  --   CREATININE 0.6  --  0.62  --   CALCIUM 9.3  --  9.1  --   TSH 9.14* 6.87*  --  8.70*   Liver Function Tests: Recent Labs    02/21/23 0000 07/06/23 1123   AST 26 14*  ALT 15 8  ALKPHOS 85 67  BILITOT  --  0.9  PROT  --  6.3*  ALBUMIN 4.1 4.0   No results for input(s): "LIPASE", "AMYLASE" in the last 8760 hours. No results for input(s): "AMMONIA" in the last 8760 hours. CBC: Recent Labs    02/21/23 0000 07/06/23 1123  WBC 4.2 3.9*  NEUTROABS  --  1.8  HGB 12.6* 12.2*  HCT 38* 37.8*  MCV  --  94.5  PLT 112* 106*   Lipid Panel: No results for input(s): "CHOL", "HDL", "LDLCALC", "TRIG", "CHOLHDL", "LDLDIRECT" in the last 8760 hours. Lab Results  Component Value Date   HGBA1C 5.2 04/10/2018    Procedures since last visit: No results found.  Assessment/Plan   Dysphagia Discuss with gastroenterology Visit   Chronic Cough Intermittent cough with concern for aspiration. Previous CT scan findings require follow-up. - Order repeat chest CT scan at [facility].   Constipation Chronic constipation managed with Senna and Miralax. Improvement noted with current regimen. Has GI visit tomorrow  Peripheral Neuropathy Progressive weakness and loss of strength in legs, managed by  neurologist Dr. Allena Katz.  Hypothyroidism  Repeat TSH Thrombocytopenia (HCC) Follows with Hematology NICM (nonischemic cardiomyopathy) (HCC) Aspirin and Coreg GERD Prilosec Flaccid neuropathic bladder, not elsewhere classified Uses In and Out cath  Trimpex Pancreatic cyst No Folloow up needed   General Health Maintenance Has not received COVID-19 vaccine. Reports no smoking for over 20 years and moderate alcohol consumption. - Encourage reduction of vodka intake to twice a week. - Perform blood tests prior to next visit. - Conduct memory testing at next visit.        Labs/tests ordered:   Next appt:  05/14/2024

## 2024-02-07 ENCOUNTER — Ambulatory Visit: Payer: PPO | Admitting: Gastroenterology

## 2024-02-08 ENCOUNTER — Ambulatory Visit (HOSPITAL_BASED_OUTPATIENT_CLINIC_OR_DEPARTMENT_OTHER)
Admission: RE | Admit: 2024-02-08 | Discharge: 2024-02-08 | Disposition: A | Discharge status: Home / Self Care | Source: Ambulatory Visit | Attending: Internal Medicine | Admitting: Internal Medicine

## 2024-02-08 DIAGNOSIS — R053 Chronic cough: Secondary | ICD-10-CM | POA: Insufficient documentation

## 2024-02-08 DIAGNOSIS — R918 Other nonspecific abnormal finding of lung field: Secondary | ICD-10-CM | POA: Diagnosis not present

## 2024-02-08 DIAGNOSIS — J479 Bronchiectasis, uncomplicated: Secondary | ICD-10-CM | POA: Diagnosis not present

## 2024-02-08 DIAGNOSIS — J439 Emphysema, unspecified: Secondary | ICD-10-CM | POA: Diagnosis not present

## 2024-02-08 DIAGNOSIS — K8689 Other specified diseases of pancreas: Secondary | ICD-10-CM | POA: Diagnosis not present

## 2024-02-08 LAB — POCT I-STAT CREATININE: Creatinine, Ser: 0.7 mg/dL (ref 0.61–1.24)

## 2024-02-08 MED ORDER — IOHEXOL 300 MG/ML  SOLN
100.0000 mL | Freq: Once | INTRAMUSCULAR | Status: AC | PRN
  Administered 2024-02-08: 75 mL via INTRAVENOUS

## 2024-02-09 ENCOUNTER — Ambulatory Visit: Admitting: Nurse Practitioner

## 2024-02-09 ENCOUNTER — Encounter: Payer: Self-pay | Admitting: Nurse Practitioner

## 2024-02-09 VITALS — BP 94/68 | HR 88 | Ht 71.0 in | Wt 180.6 lb

## 2024-02-09 DIAGNOSIS — R1310 Dysphagia, unspecified: Secondary | ICD-10-CM | POA: Diagnosis not present

## 2024-02-09 DIAGNOSIS — K5900 Constipation, unspecified: Secondary | ICD-10-CM

## 2024-02-09 DIAGNOSIS — D696 Thrombocytopenia, unspecified: Secondary | ICD-10-CM

## 2024-02-09 DIAGNOSIS — D649 Anemia, unspecified: Secondary | ICD-10-CM | POA: Diagnosis not present

## 2024-02-09 DIAGNOSIS — R1311 Dysphagia, oral phase: Secondary | ICD-10-CM

## 2024-02-09 DIAGNOSIS — K862 Cyst of pancreas: Secondary | ICD-10-CM | POA: Diagnosis not present

## 2024-02-09 DIAGNOSIS — Z8719 Personal history of other diseases of the digestive system: Secondary | ICD-10-CM | POA: Diagnosis not present

## 2024-02-09 DIAGNOSIS — R918 Other nonspecific abnormal finding of lung field: Secondary | ICD-10-CM

## 2024-02-09 NOTE — Patient Instructions (Addendum)
 Take Benefiber- take 1 tablespoon once daily  Senna Laxative- take 1-2 tablets every 3rd night (over the counter)  Miralax- every night as needed  Contact our office if your swallowing difficulties persists or worsens.  Due to recent changes in healthcare laws, you may see the results of your imaging and laboratory studies on MyChart before your provider has had a chance to review them.  We understand that in some cases there may be results that are confusing or concerning to you. Not all laboratory results come back in the same time frame and the provider may be waiting for multiple results in order to interpret others.  Please give [place] 48 hours in order for your provider to thoroughly review all the results before contacting the office for clarification of your results.   Thank you for trusting me with your gastrointestinal care!   Alcide Evener, CRNP

## 2024-02-09 NOTE — Progress Notes (Signed)
 02/09/2024 David Gutierrez [id number] October 23, 1933   CHIEF COMPLAINT: Constipation, sometimes has difficulty swallowing  HISTORY OF PRESENT ILLNESS: David Gutierrez is an 89 year old male with a past medical history of arthritis, B12 deficiency, peripheral neuropathy, nonobstructive CAD, LBBB, hypothyroidism, BPH/neurogenic bladder requiring self-catheterization, B12 deficiency, chronic anemia, thrombocytopenia, GERD, esophagitis, peptic stricture, hiatal hernia, pancreatic cystic lesion, and colon polyps.    He was last seen in office by Dr. Marina Goodell 01/15/2020 for further evaluation regarding GERD and recurrent dysphagia.  He underwent an EGD 02/19/2020 which identified a benign-appearing esophageal stenosis which was dilated and a benign nodule was also noted in the esophagus.  He presents to our office today as referred by Dr. Einar Crow for further evaluation regarding constipation and dysphagia.  He stated his main concern today is his bowel pattern.  He is passing a bowel movement every 2 to 3 days, does not feel empty.  He has less strength to push a stool out.  He takes MiraLAX sporadically, approximately twice weekly which sometimes results in passing a soft bowel movement, other times does nothing and sometimes he develops a harsh reaction and has urgent loose stools and sometimes does not reach the bathroom in time and soils himself.  No bloody or black stools.  No abdominal or rectal pain.  His most recent colonoscopy was 02/20/2018 which identified 3 tubular adenomatous polyps removed from the colon, diverticulosis in the left colon and internal hemorrhoids.  He is less concerned regarding dysphagia symptoms.  He has difficulty describing his swallowing difficulty.  He stated what he eats peanuts or ice cream it feels as if those food products get semistuck in his throat and he coughs up phlegm.  He tries to eat slowly and chews his food thoroughly.  He denies having any heartburn.  He might  be taking Omeprazole 20 mg daily, he is not sure.  His most recent EGD was 02/19/2020 which identified a benign-appearing esophageal stenosis which was dilated as noted above.  He was recently seen by his PCP due to having a productive cough and recent CT by urology showed a right lower lobe infiltrate treated with Doxycycline x 10 days.  He underwent a chest CT with contrast 02/08/2024, results are pending.  He was last seen by Dr. Elnita Maxwell 07/06/2023 regarding thrombocytopenia and chronic anemia, hematological abnormalities possibly related to alcohol use, myelodysplasia, chronic liver disease or ITP.     Latest Ref Rng & Units 07/06/2023   11:23 AM 02/21/2023   12:00 AM 01/05/2023   11:16 AM  CBC  WBC 4.0 - 10.5 K/uL 3.9  4.2     4.0   Hemoglobin 13.0 - 17.0 g/dL 78.2  95.6     21.3   Hematocrit 39.0 - 52.0 % 37.8  38     40.6   Platelets 150 - 400 K/uL 106  112     94      This result is from an external source.       Latest Ref Rng & Units 02/08/2024    9:05 AM 07/06/2023   11:23 AM 02/21/2023   12:00 AM  CMP  Glucose 70 - 99 mg/dL  90    BUN 8 - 23 mg/dL  11  11      Creatinine 0.61 - 1.24 mg/dL 0.86  5.78  0.6      Sodium 135 - 145 mmol/L  138  141      Potassium 3.5 - 5.1  mmol/L  4.3  4.7      Chloride 98 - 111 mmol/L  104  104      CO2 22 - 32 mmol/L  29  28      Calcium 8.9 - 10.3 mg/dL  9.1  9.3      Total Protein 6.5 - 8.1 g/dL  6.3    Total Bilirubin 0.3 - 1.2 mg/dL  0.9    Alkaline Phos 38 - 126 U/L  67  85      AST 15 - 41 U/L  14  26      ALT 0 - 44 U/L  8  15         This result is from an external source.    MOST RECENT GI PROCEDURES:  EGD 02/19/2020: - Mucosal nodule found in the esophagus. Biopsied/removed.  - Benign-appearing esophageal stenosis. Dilated.  - Normal stomach.  - Normal examined duodenum.  - SQUAMOCOLUMNAR JUNCTION WITH CHRONIC INFLAMMATION - NO INTESTINAL METAPLASIA, DYSPLASIA OR MALIGNANCY IDENTIFIED - SEE COMMENT  Colonoscopy 02/20/2018: - Three 5  to 7 mm polyps in the transverse colon and in the cecum, removed with a cold snare. Resected and retrieved.  - Diverticulosis in the left colon.  - Internal hemorrhoids.  - The examination was otherwise normal on direct and retroflexion views. 1. Surgical [P], cecum, polyps (2) - TUBULAR ADENOMA WITHOUT HIGH GRADE DYSPLASIA OR MALIGNANCY. - OTHER FRAGMENT OF POLYPOID COLONIC MUCOSA WITH NO SPECIFIC HISTOPATHOLOGIC CHANGES. 2. Surgical [P], transverse, polyp - TUBULAR ADENOMA WITHOUT HIGH GRADE DYSPLASIA OR MALIGNANCY.  Past Medical History:  Diagnosis Date   Alcoholic peripheral neuropathy [facility])    neurologist-  dr patel--- feet numbness and sensory ataxia   Arthritis    B12 deficiency    BPH (benign prostatic hyperplasia)    CAD (coronary artery disease) cardiolgoist-  dr Cristal Deer end (previouly dr dalton Shirlee Latch)   a. Myoview 11/13:  EF 38%, inf and IS defect c/w scar but no ischemia:  b. Cardiac CT 11/13:  Ca score 318 Agatson units, pLAD and dCFX plaque;   c. LHC 11/07/12:  pLAD 30%, oD1 40%, oCFX 30%, dCFX 40-50%, mRCA 40%, EF 50% (frequent PVCs and short run of NSVT with injection/    per last echo 01/ 2017  ef 55-60%   Chronic constipation    Diverticulosis of colon    Esophageal reflux    External hemorrhoids    First degree heart block    Hiatal hernia    History of adenomatous polyp of colon    History of esophageal stricture 08/11/2015   s/p  dilatation   History of lower GI bleeding 03/01/2017   s/p  flexiable simoidscopy w/ clipping rectum ulcer   Hypogonadism male    Hypothyroidism    Interstitial cystitis    Irritable bowel syndrome    LBBB (left bundle branch block)    NICM (nonischemic cardiomyopathy) [facility]) cardiologist-  dr Cristal Deer end (previously dr dalton Shirlee Latch)--  per last echo 01/ 2017  ef 55-60%   ? 2/2 LBBB => a. echo 11/13: diff HK, worse in septum and apex, mod LVE, mild LVH, EF 40%, mild AI, mild MR, mod LAE   Pre-diabetes    Self-catheterizes  urinary bladder    bid to tid    Wears glasses    Wears hearing aid in both ears    Past Surgical History:  Procedure Laterality Date   CARDIAC CATHETERIZATION  11-07-2012    dr Shirlee Latch  nonobstructive CAD (pLAD 30%, ostial D1 40%, ostial LCx 30%, dLCx diffuse 40-50%, mRCA 40%) ;  LVSF 50% but diffiult due to PVCs and short run VT with injection;  cardiomyopathy mostly likely a LBBB cardiomyopathy   CARDIOVASCULAR STRESS TEST  10-16-2012   dr Shirlee Latch   Low risk adenosine nuclear study (no exercise) w/ a fixed inferior and inferoseptal defect without ischemia (question as whether this abnormality due to LBBB cardiomyopathy or due to scar)/  LV ef 38%,  LV wall motion decreased of the septum and entire apex   CATARACT EXTRACTION W/ INTRAOCULAR LENS  IMPLANT, BILATERAL  2012  approx.   COLONOSCOPY  2016   Dr. Marina Goodell, Per new patient form   CYSTO/  HYDRODISTENTION/  BLADDER BIOPSY  04-20-2005    dr Darvin Neighbours  [facility]   ETHMOIDECTOMY Right 12/12/2016   Procedure: RIGHT ENDOSCOPIC ETHMOIDECTOMY;  Surgeon: Newman Pies, MD;  Location: [facility];  Service: ENT;  Laterality: Right;   FLEXIBLE SIGMOIDOSCOPY N/A 03/02/2017   Procedure: FLEXIBLE SIGMOIDOSCOPY;  Surgeon: Meryl Dare, MD;  Location: [facility];  Service: Endoscopy;  Laterality: N/A;   LUMBAR LAMINECTOMY  1952   MAXILLARY ANTROSTOMY Right 12/12/2016   Procedure: RIGHT ENDOSCOPIC MAXILLARY ANTROSTOMY;  Surgeon: Newman Pies, MD;  Location: [facility];  Service: ENT;  Laterality: Right;   SINUS ENDO W/FUSION Right 12/12/2016   Procedure: ENDOSCOPIC SINUS SURGERY WITH NAVIGATION;  Surgeon: Newman Pies, MD;  Location: [facility];  Service: ENT;  Laterality: Right;   SPHENOIDECTOMY Right 12/12/2016   Procedure: RIGHT ENDOSCOPIC SPHENOIDECTOMY;  Surgeon: Newman Pies, MD;  Location: [facility];  Service: ENT;  Laterality: Right;   TONSILLECTOMY AND ADENOIDECTOMY  child   TOTAL KNEE ARTHROPLASTY Left  10/14/2014   Procedure: LEFT TOTAL KNEE ARTHROPLASTY;  Surgeon: Shelda Pal, MD;  Location: [facility];  Service: Orthopedics;  Laterality: Left;   TOTAL KNEE ARTHROPLASTY Right 1990s   TRANSTHORACIC ECHOCARDIOGRAM  12-24-2015   dr Shirlee Latch   ef 55-60%,  grade 1 diastolic dysfunction/  trivial AR and TR  mild MR and PR/  moderate LAE   TRANSURETHRAL RESECTION OF PROSTATE  06-11-2010   dr Patsi Sears  [facility]   w/  GYRUS   TRANSURETHRAL RESECTION OF PROSTATE N/A 12/06/2017   Procedure: TRANSURETHRAL RESECTION OF THE PROSTATE (TURP)/ BIPOLAR;  Surgeon: Rene Paci, MD;  Location: [facility];  Service: Urology;  Laterality: N/A;   TRANSURETHRAL RESECTION OF PROSTATE N/A 04/11/2018   Procedure: TRANSURETHRAL RESECTION OF THE PROSTATE (TURP);  Surgeon: Rene Paci, MD;  Location: [facility];  Service: Urology;  Laterality: N/A;   UPPER GASTROINTESTINAL ENDOSCOPY     Social History: He is married.  Retired.  He has 2 sons and 1 daughter.  He quit smoking 56 year ago. He drinks 3 ounces of alcohol night.   Family History: family history includes Diabetes type I in his son; Healthy in his father, mother, and sister; Heart disease in his brother; Other in his sister; Thyroid cancer in his brother.  Allergies  Allergen Reactions   Cyclobenzaprine Other (See Comments)    Lost balance   Lisinopril Other (See Comments)    Affected ability to urinate   Relafen [Nabumetone] Other (See Comments)    Problems urinating afterwards    Statins Other (See Comments)    "my peeing"      Outpatient Encounter Medications as of 02/09/2024  Medication Sig   AZO-CRANBERRY PO Take  1 tablet by mouth daily.   carvedilol (COREG) 6.25 MG tablet TAKE 1 TABLET BY MOUTH TWICE A DAY   ciprofloxacin (CIPRO) 500 MG tablet Take 500 mg by mouth 2 (two) times daily as needed.   Cyanocobalamin (VITAMIN B12) 1000 MCG TBCR Take 1,000 mcg by mouth.   diazepam (VALIUM) 5 MG tablet TAKE 0.5 TABLETS  (2.5 MG TOTAL) BY MOUTH 2 (TWO) TIMES DAILY. 1/2 TABLET EVERY 2-3 DAYS   gabapentin (NEURONTIN) 300 MG capsule TAKE 1 CAPSULE EVERY DAY   levothyroxine (SYNTHROID) 25 MCG tablet Take 1 tablet (25 mcg total) by mouth daily before breakfast. Tuesday Thursday Friday Saturday   levothyroxine (SYNTHROID) 50 MCG tablet TAKE 1 TABLET (50 MCG TOTAL) BY MOUTH DAILY BEFORE BREAKFAST. MONDAY WEDNESDAY SUNDAY   omeprazole (PRILOSEC) 20 MG capsule TAKE 1 CAPSULE BY MOUTH EVERY DAY   polyethylene glycol (MIRALAX / GLYCOLAX) packet Take 17 g by mouth daily as needed for mild constipation.   Probiotic Product (PROBIOTIC PO) Take 1 capsule by mouth daily.   trimethoprim (TRIMPEX) 100 MG tablet Take 100 mg by mouth 2 (two) times daily.   No facility-administered encounter medications on file as of 02/09/2024.   REVIEW OF SYSTEMS:  Gen: Denies fever, sweats or chills. No weight loss.  CV: Denies chest pain, palpitations or edema. Resp: Denies cough, shortness of breath of hemoptysis.  GI: See HPI. GU: Self catheterizes qid. Infrequent UTIs, treated with Cipro.  MS: Denies joint pain, muscles aches or weakness. Derm: Denies rash, itchiness, skin lesions or unhealing ulcers. Psych: Denies depression, anxiety, memory loss or confusion. Heme: + Easy bruising.  Neuro:  Denies headaches, dizziness or paresthesias. Endo:  Denies any problems with DM, thyroid or adrenal function.  PHYSICAL EXAM: BP 94/68 (BP Location: Right Arm, Patient Position: Sitting, Cuff Size: Normal)   Pulse 88   Ht 5\' 11"  (1.803 m)   Wt 180 lb 9.6 oz (81.9 kg)   BMI 25.19 kg/m  Wt Readings from Last 3 Encounters:  02/09/24 180 lb 9.6 oz (81.9 kg)  02/06/24 181 lb 6.4 oz (82.3 kg)  12/05/23 184 lb 6.4 oz (83.6 kg)    General: 89 year old male in no acute distress. Head: Normocephalic and atraumatic. Eyes:  Sclerae non-icteric, conjunctive pink. Ears: Normal auditory acuity. Mouth: Dentition intact. No ulcers or lesions.  Neck:  Supple, no lymphadenopathy or thyromegaly.  Lungs: Breath sounds clear, decreased in the bases. Heart: Regular rate and rhythm. No murmur, rub or gallop appreciated.  Abdomen: Soft, nontender, nondistended. No masses. No hepatosplenomegaly. Normoactive bowel sounds x 4 quadrants.  Rectal: Deferred. Musculoskeletal: Symmetrical with no gross deformities. Skin: Warm and dry. No rash or lesions on visible extremities. Extremities: No edema. Neurological: Alert oriented x 4, no focal deficits.  Psychological:  Alert and cooperative. Normal mood and affect.  ASSESSMENT AND PLAN:  89 year old male with constipation -Benefiber 1 tablespoon daily -Senna laxative 1-2 tabs every third night -May add MiraLAX 1/2 dose to 1 full dose nightly as needed -Diet as tolerated  Intermittent dysphagia.  EGD 02/2020 identified a benign-appearing esophageal stenosis which was dilated. -Patient does not wish to pursue EGD for barium swallow study at this time as he is not concerned about his current swallowing issues -Patient was instructed to contact our office if his swallowing difficulties persist or worsen -Patient instructed to take Omeprazole 20 mg daily -Patient instructed to cut food into small pieces, chew food thoroughly and avoid eating large pieces of meat, bread or rice  Chronic  anemia and thrombocytopenia, CTAP 03/2022 without evidence of cirrhosis or splenomegaly. -Follow-up with hematologist/oncologist Dr. Truett Perna  History of a pancreatic cyst.  CTAP 03/2022 showed a stable 3.4 x 3.0 cm cystic lesion to the pancreatic head. -Defer recommendations regarding any future surveillance image studies to Dr. Marina Goodell  Cough, CT per urology showed evidence of right lower lung infiltrate questionable aspiration treated with Doxycycline x 10 days.  Chest CT 02/08/2024 results pending.  Nonobstructive CAD.  No angina.    CC:  Mahlon Gammon, MD

## 2024-02-09 NOTE — Progress Notes (Signed)
 Noted.

## 2024-02-28 ENCOUNTER — Telehealth: Payer: Self-pay

## 2024-02-28 NOTE — Telephone Encounter (Signed)
 Copied from CRM [id number]. Topic: General - Other >> Feb 28, 2024  8:46 AM Maree Krabbe H wrote: Reason for CRM: Patient called and wanted to speak with Dr. Chales Abrahams, he said she called and left a voice message to cal her back, patients callback number is [phone]   Spoke with patient, patient states he was returning someone's call from this office and he thinks it was Dr.Gupta. Refer to CT results

## 2024-03-05 ENCOUNTER — Inpatient Hospital Stay: Payer: Self-pay

## 2024-03-05 ENCOUNTER — Other Ambulatory Visit: Payer: PPO

## 2024-03-05 ENCOUNTER — Inpatient Hospital Stay: Payer: PPO | Attending: Oncology | Admitting: Oncology

## 2024-03-05 VITALS — BP 119/73 | HR 61 | Temp 98.2°F | Resp 18 | Ht 71.0 in | Wt 177.4 lb

## 2024-03-05 DIAGNOSIS — D72819 Decreased white blood cell count, unspecified: Secondary | ICD-10-CM | POA: Diagnosis not present

## 2024-03-05 DIAGNOSIS — D649 Anemia, unspecified: Secondary | ICD-10-CM | POA: Insufficient documentation

## 2024-03-05 DIAGNOSIS — E538 Deficiency of other specified B group vitamins: Secondary | ICD-10-CM | POA: Insufficient documentation

## 2024-03-05 DIAGNOSIS — D696 Thrombocytopenia, unspecified: Secondary | ICD-10-CM | POA: Diagnosis not present

## 2024-03-05 DIAGNOSIS — R3914 Feeling of incomplete bladder emptying: Secondary | ICD-10-CM | POA: Diagnosis not present

## 2024-03-05 DIAGNOSIS — N312 Flaccid neuropathic bladder, not elsewhere classified: Secondary | ICD-10-CM | POA: Diagnosis not present

## 2024-03-05 DIAGNOSIS — N139 Obstructive and reflux uropathy, unspecified: Secondary | ICD-10-CM | POA: Diagnosis not present

## 2024-03-05 LAB — CBC WITH DIFFERENTIAL (CANCER CENTER ONLY)
Abs Immature Granulocytes: 0.01 10*3/uL (ref 0.00–0.07)
Basophils Absolute: 0 10*3/uL (ref 0.0–0.1)
Basophils Relative: 1 %
Eosinophils Absolute: 0.1 10*3/uL (ref 0.0–0.5)
Eosinophils Relative: 2 %
HCT: 38 % — ABNORMAL LOW (ref 39.0–52.0)
Hemoglobin: 12.1 g/dL — ABNORMAL LOW (ref 13.0–17.0)
Immature Granulocytes: 0 %
Lymphocytes Relative: 50 %
Lymphs Abs: 2 10*3/uL (ref 0.7–4.0)
MCH: 30.4 pg (ref 26.0–34.0)
MCHC: 31.8 g/dL (ref 30.0–36.0)
MCV: 95.5 fL (ref 80.0–100.0)
Monocytes Absolute: 0.3 10*3/uL (ref 0.1–1.0)
Monocytes Relative: 9 %
Neutro Abs: 1.5 10*3/uL — ABNORMAL LOW (ref 1.7–7.7)
Neutrophils Relative %: 38 %
Platelet Count: 90 10*3/uL — ABNORMAL LOW (ref 150–400)
RBC: 3.98 MIL/uL — ABNORMAL LOW (ref 4.22–5.81)
RDW: 18.6 % — ABNORMAL HIGH (ref 11.5–15.5)
WBC Count: 3.9 10*3/uL — ABNORMAL LOW (ref 4.0–10.5)
nRBC: 0 % (ref 0.0–0.2)

## 2024-03-05 NOTE — Progress Notes (Signed)
  [facility] OFFICE PROGRESS NOTE   Diagnosis: Thrombocytopenia  INTERVAL HISTORY:   David Gutierrez returns as scheduled.  He reports chronic anorexia.  Has intermittent dysphagia.  He has been evaluated by gastroenterology.  He has easy bruising over the extremities.  No other bleeding.  No fever or night sweats. He continues to perform self-catheterization.  He has to alcoholic drinks each night. Objective:  Vital signs in last 24 hours:  Blood pressure 119/73, pulse 61, temperature 98.2 F (36.8 C), temperature source Temporal, resp. rate 18, height 5\' 11"  (1.803 m), weight 177 lb 6.4 oz (80.5 kg), SpO2 100%.    Lymphatics: No cervical, supraclavicular, axillary, or inguinal nodes Resp: Breath sounds, no respiratory distress Cardio: Distant heart sounds, regular rhythm GI: No hepatosplenomegaly Vascular: Trace edema with chronic stasis change at the lower leg bilaterally  Skin: Ecchymoses at the dorsum of the hands, forearms, and lower legs  Lab Results:  Lab Results  Component Value Date   WBC 3.9 (L) 03/05/2024   HGB 12.1 (L) 03/05/2024   HCT 38.0 (L) 03/05/2024   MCV 95.5 03/05/2024   PLT 90 (L) 03/05/2024   NEUTROABS 1.5 (L) 03/05/2024    CMP  Lab Results  Component Value Date   NA 138 07/06/2023   K 4.3 07/06/2023   CL 104 07/06/2023   CO2 29 07/06/2023   GLUCOSE 90 07/06/2023   BUN 11 07/06/2023   CREATININE 0.70 02/08/2024   CALCIUM 9.1 07/06/2023   PROT 6.3 (L) 07/06/2023   ALBUMIN 4.0 07/06/2023   AST 14 (L) 07/06/2023   ALT 8 07/06/2023   ALKPHOS 67 07/06/2023   BILITOT 0.9 07/06/2023   GFRNONAA >60 07/06/2023   GFRAA >60 04/10/2018    No results found for: "CEA1", "CEA", "CAN199", "CA125"  Lab Results  Component Value Date   INR 1.09 03/01/2017   LABPROT 14.1 03/01/2017    Imaging:  No results found.  Medications: I have reviewed the patient's current medications.   Assessment/Plan: Mild thrombocytopenia and  anemia BPH/neurogenic bladder, self catheterizes Hypothyroid CAD Nonischemic cardiomyopathy Chronic left bundle branch block Peripheral neuropathy Esophageal stricture/dysphagia followed by Dr. 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 Sciandra appears unchanged.  He has chronic mild thrombocytopenia.  The white count and hemoglobin are mildly low.  The hematologic findings could be related to alcohol use, unrecognized chronic liver disease, or myelodysplasia.  He does not appear to have multiple myeloma based on the negative serum immunofixation and normal immunoglobulin levels in August 2024.  He would like to continue follow-up at the cancer center.  He will return for an office and lab visit in 8 months.  He will call in the interim for bleeding.  Thornton Papas, MD  03/05/2024  12:42 PM

## 2024-03-06 ENCOUNTER — Telehealth: Payer: Self-pay | Admitting: Oncology

## 2024-03-06 NOTE — Telephone Encounter (Signed)
 Patient has been scheduled for follow-up visit per 03/05/24 LOS.  Pt aware of appt details

## 2024-03-19 ENCOUNTER — Other Ambulatory Visit: Payer: Self-pay | Admitting: Internal Medicine

## 2024-03-20 ENCOUNTER — Other Ambulatory Visit: Payer: Self-pay | Admitting: Internal Medicine

## 2024-03-20 DIAGNOSIS — I428 Other cardiomyopathies: Secondary | ICD-10-CM

## 2024-03-20 DIAGNOSIS — I2510 Atherosclerotic heart disease of native coronary artery without angina pectoris: Secondary | ICD-10-CM

## 2024-03-26 ENCOUNTER — Ambulatory Visit: Admitting: Gastroenterology

## 2024-03-28 ENCOUNTER — Other Ambulatory Visit: Payer: Self-pay | Admitting: Internal Medicine

## 2024-03-28 DIAGNOSIS — I2510 Atherosclerotic heart disease of native coronary artery without angina pectoris: Secondary | ICD-10-CM

## 2024-03-28 DIAGNOSIS — I428 Other cardiomyopathies: Secondary | ICD-10-CM

## 2024-03-31 ENCOUNTER — Other Ambulatory Visit: Payer: Self-pay | Admitting: Internal Medicine

## 2024-04-01 NOTE — Telephone Encounter (Signed)
 Pharmacy requested refill.  Epic LR: 11/22/2023 Contract Date: 07/31/2023  Pended Rx and sent to Dr. Venice Gillis for approval.

## 2024-04-04 ENCOUNTER — Encounter (HOSPITAL_BASED_OUTPATIENT_CLINIC_OR_DEPARTMENT_OTHER): Payer: Self-pay

## 2024-04-04 ENCOUNTER — Other Ambulatory Visit: Payer: Self-pay

## 2024-04-04 ENCOUNTER — Emergency Department (HOSPITAL_BASED_OUTPATIENT_CLINIC_OR_DEPARTMENT_OTHER)

## 2024-04-04 ENCOUNTER — Emergency Department (HOSPITAL_BASED_OUTPATIENT_CLINIC_OR_DEPARTMENT_OTHER)
Admission: EM | Admit: 2024-04-04 | Discharge: 2024-04-04 | Disposition: A | Discharge status: Home / Self Care | Attending: Emergency Medicine | Admitting: Emergency Medicine

## 2024-04-04 DIAGNOSIS — S50311A Abrasion of right elbow, initial encounter: Secondary | ICD-10-CM | POA: Diagnosis not present

## 2024-04-04 DIAGNOSIS — E039 Hypothyroidism, unspecified: Secondary | ICD-10-CM | POA: Insufficient documentation

## 2024-04-04 DIAGNOSIS — S51011A Laceration without foreign body of right elbow, initial encounter: Secondary | ICD-10-CM | POA: Diagnosis not present

## 2024-04-04 DIAGNOSIS — I2510 Atherosclerotic heart disease of native coronary artery without angina pectoris: Secondary | ICD-10-CM | POA: Insufficient documentation

## 2024-04-04 DIAGNOSIS — N139 Obstructive and reflux uropathy, unspecified: Secondary | ICD-10-CM | POA: Diagnosis not present

## 2024-04-04 DIAGNOSIS — S81011A Laceration without foreign body, right knee, initial encounter: Secondary | ICD-10-CM | POA: Insufficient documentation

## 2024-04-04 DIAGNOSIS — Z79899 Other long term (current) drug therapy: Secondary | ICD-10-CM | POA: Insufficient documentation

## 2024-04-04 DIAGNOSIS — R3914 Feeling of incomplete bladder emptying: Secondary | ICD-10-CM | POA: Diagnosis not present

## 2024-04-04 DIAGNOSIS — S0091XA Abrasion of unspecified part of head, initial encounter: Secondary | ICD-10-CM | POA: Diagnosis not present

## 2024-04-04 DIAGNOSIS — W01198A Fall on same level from slipping, tripping and stumbling with subsequent striking against other object, initial encounter: Secondary | ICD-10-CM | POA: Insufficient documentation

## 2024-04-04 DIAGNOSIS — N312 Flaccid neuropathic bladder, not elsewhere classified: Secondary | ICD-10-CM | POA: Diagnosis not present

## 2024-04-04 DIAGNOSIS — Y9301 Activity, walking, marching and hiking: Secondary | ICD-10-CM | POA: Insufficient documentation

## 2024-04-04 DIAGNOSIS — I6523 Occlusion and stenosis of bilateral carotid arteries: Secondary | ICD-10-CM | POA: Insufficient documentation

## 2024-04-04 DIAGNOSIS — I10 Essential (primary) hypertension: Secondary | ICD-10-CM | POA: Diagnosis not present

## 2024-04-04 DIAGNOSIS — W19XXXA Unspecified fall, initial encounter: Secondary | ICD-10-CM

## 2024-04-04 DIAGNOSIS — Z043 Encounter for examination and observation following other accident: Secondary | ICD-10-CM | POA: Diagnosis not present

## 2024-04-04 DIAGNOSIS — S59901A Unspecified injury of right elbow, initial encounter: Secondary | ICD-10-CM | POA: Diagnosis present

## 2024-04-04 DIAGNOSIS — T148XXA Other injury of unspecified body region, initial encounter: Secondary | ICD-10-CM

## 2024-04-04 DIAGNOSIS — Z23 Encounter for immunization: Secondary | ICD-10-CM | POA: Diagnosis not present

## 2024-04-04 MED ORDER — TETANUS-DIPHTH-ACELL PERTUSSIS 5-2.5-18.5 LF-MCG/0.5 IM SUSY
0.5000 mL | PREFILLED_SYRINGE | Freq: Once | INTRAMUSCULAR | Status: AC
  Administered 2024-04-04: 0.5 mL via INTRAMUSCULAR
  Filled 2024-04-04: qty 0.5

## 2024-04-04 NOTE — ED Triage Notes (Signed)
 Brought in via EMS. Patient was trying out his new shoes out and was walking when they were too grippy and he fell on the pavement. He is on on any anticoagulants. He has an abrasion to his right forehead. And a bruise on his knee. He is in a Veterinary surgeon from EMS. A&Ox4

## 2024-04-04 NOTE — ED Provider Notes (Signed)
  EMERGENCY DEPARTMENT AT [facility] Provider Note   CSN: [id number] Arrival date & time: 04/04/24  8295     History  Chief Complaint  Patient presents with   David Gutierrez is a 89 y.o. male.  HPI     89yo male with history of CAD, B12 deficiency, peripheral neuropathy, hypothyroidism who presents with concern for fall.  Reports he was walking in new sneakers with strong grips when his foot stuck on the ground causing him to fall. Fell grazing his head into the wall. No LOC. Not on anticoagulation. Denies numbness, weakness, neck or back pain. Hit elbow on the rug and caused skin tear. Denies significant elbow pain. Does report having stinging of skin tear. Small abrasion to knee as well without significant leg or knee pain.     Past Medical History:  Diagnosis Date   Alcoholic peripheral neuropathy [facility])    neurologist-  dr patel--- feet numbness and sensory ataxia   Arthritis    B12 deficiency    BPH (benign prostatic hyperplasia)    CAD (coronary artery disease) cardiolgoist-  dr Veryl Gottron end (previouly dr dalton Mitzie Anda)   a. Myoview 11/13:  EF 38%, inf and IS defect c/w scar but no ischemia:  b. Cardiac CT 11/13:  Ca score 318 Agatson units, pLAD and dCFX plaque;   c. LHC 11/07/12:  pLAD 30%, oD1 40%, oCFX 30%, dCFX 40-50%, mRCA 40%, EF 50% (frequent PVCs and short run of NSVT with injection/    per last echo 01/ 2017  ef 55-60%   Chronic constipation    Diverticulosis of colon    Esophageal reflux    External hemorrhoids    First degree heart block    Hiatal hernia    History of adenomatous polyp of colon    History of esophageal stricture 08/11/2015   s/p  dilatation   History of lower GI bleeding 03/01/2017   s/p  flexiable simoidscopy w/ clipping rectum ulcer   Hypogonadism male    Hypothyroidism    Interstitial cystitis    Irritable bowel syndrome    LBBB (left bundle branch block)    NICM (nonischemic cardiomyopathy) [facility])  cardiologist-  dr Veryl Gottron end (previously dr dalton Mitzie Anda)--  per last echo 01/ 2017  ef 55-60%   ? 2/2 LBBB => a. echo 11/13: diff HK, worse in septum and apex, mod LVE, mild LVH, EF 40%, mild AI, mild MR, mod LAE   Pre-diabetes    Self-catheterizes urinary bladder    bid to tid    Wears glasses    Wears hearing aid in both ears      Home Medications Prior to Admission medications   Medication Sig Start Date End Date Taking? Authorizing Provider  AZO-CRANBERRY PO Take 1 tablet by mouth daily.    [provider]  carvedilol  (COREG ) 6.25 MG tablet TAKE 1 TABLET BY MOUTH TWICE A DAY 03/20/24   Elmyra Haggard, MD  ciprofloxacin  (CIPRO ) 500 MG tablet Take 500 mg by mouth 2 (two) times daily as needed. Patient not taking: Reported on 03/05/2024 12/30/23   [provider]  Cyanocobalamin  (VITAMIN B12) 1000 MCG TBCR Take 1,000 mcg by mouth.    [provider]  diazepam  (VALIUM ) 5 MG tablet TAKE 1/2 TABLET BY MOUTH 2 (TWO) TIMES DAILY. 1/2 TABLET EVERY 2-3 DAYS 04/01/24   Marguerite Shiley, MD  gabapentin  (NEURONTIN ) 300 MG capsule TAKE 1 CAPSULE EVERY DAY 09/04/23   Lydia Sams,  Donika K, DO  levothyroxine  (SYNTHROID ) 25 MCG tablet Take 1 tablet (25 mcg total) by mouth daily before breakfast. Tuesday Thursday Friday Saturday 12/05/23   Marguerite Shiley, MD  levothyroxine  (SYNTHROID ) 50 MCG tablet TAKE 1 TABLET (50 MCG TOTAL) BY MOUTH DAILY BEFORE BREAKFAST. MONDAY [facility] SUNDAY 10/11/23   Marguerite Shiley, MD  omeprazole  (PRILOSEC) 20 MG capsule TAKE 1 CAPSULE BY MOUTH EVERY DAY 03/19/24   Tobin Forts, MD  polyethylene glycol (MIRALAX  / GLYCOLAX ) packet Take 17 g by mouth daily as needed for mild constipation.    [provider]  Probiotic Product (PROBIOTIC PO) Take 1 capsule by mouth daily.    [provider]  trimethoprim  (TRIMPEX ) 100 MG tablet Take 100 mg by mouth 2 (two) times daily. 09/17/18   [provider]      Allergies    Cyclobenzaprine,  Lisinopril , Relafen [nabumetone], and Statins    Review of Systems   Review of Systems  Physical Exam Updated Vital Signs BP (!) 150/79   Pulse 79   Temp 97.7 F (36.5 C) (Temporal)   Resp 18   Ht 5\' 11"  (1.803 m)   Wt 78.5 kg   SpO2 97%   BMI 24.13 kg/m  Physical Exam Vitals and nursing note reviewed.  Constitutional:      General: He is not in acute distress.    Appearance: He is well-developed. He is not diaphoretic.  HENT:     Head: Normocephalic and atraumatic.  Eyes:     Conjunctiva/sclera: Conjunctivae normal.  Cardiovascular:     Rate and Rhythm: Normal rate and regular rhythm.     Heart sounds: Normal heart sounds. No murmur heard.    No friction rub. No gallop.  Pulmonary:     Effort: Pulmonary effort is normal. No respiratory distress.     Breath sounds: Normal breath sounds. No wheezing or rales.  Abdominal:     General: There is no distension.     Palpations: Abdomen is soft.     Tenderness: There is no abdominal tenderness. There is no guarding.  Musculoskeletal:     Cervical back: Normal range of motion.  Skin:    General: Skin is warm and dry.     Comments: Abrasion right side of head Right elbow with 4cm skin tear.  Tiny abrasion/superficial laceration to knee  Neurological:     Mental Status: He is alert and oriented to person, place, and time.     ED Results / Procedures / Treatments   Labs (all labs ordered are listed, but only abnormal results are displayed) Labs Reviewed - No data to display  EKG None  Radiology CT Head Wo Contrast Result Date: 04/04/2024 CLINICAL DATA:  Fall EXAM: CT HEAD WITHOUT CONTRAST CT CERVICAL SPINE WITHOUT CONTRAST TECHNIQUE: Multidetector CT imaging of the head and cervical spine was performed following the standard protocol without intravenous contrast. Multiplanar CT image reconstructions of the cervical spine were also generated. RADIATION DOSE REDUCTION: This exam was performed according to the departmental  dose-optimization program which includes automated exposure control, adjustment of the mA and/or kV according to patient size and/or use of iterative reconstruction technique. COMPARISON:  None Available. FINDINGS: CT HEAD FINDINGS Brain: No evidence of large-territorial acute infarction. No parenchymal hemorrhage. No mass lesion. No extra-axial collection. No mass effect or midline shift. No hydrocephalus. Basilar cisterns are patent. Vascular: No hyperdense vessel. Skull: No acute fracture or focal lesion. Sinuses/Orbits: Right sphenoid and maxillary sinus mucosal thickening. Trace  partial right mastoid air cell effusion. Otherwise paranasal sinuses and mastoid air cells are clear. Bilateral lens replacement. Otherwise the orbits are unremarkable. Other: None. CT CERVICAL SPINE FINDINGS Alignment: Normal. Skull base and vertebrae: Multilevel moderate severe degenerative changes of the spine. Associated multilevel severe osseous neural foraminal stenosis. No severe osseous central canal stenosis. Cystic changes of the cervical spine appears subchondral and likely degenerative in etiology. no acute fracture. No aggressive appearing focal osseous lesion or focal pathologic process. Soft tissues and spinal canal: No prevertebral fluid or swelling. No visible canal hematoma. Upper chest: Unremarkable. Other: Atherosclerotic plaque of the carotid arteries within the neck. IMPRESSION: 1. No acute intracranial abnormality. 2. No acute displaced fracture or traumatic listhesis of the cervical spine. 3. Multilevel moderate severe degenerative changes of the spine. Associated multilevel severe osseous neural foraminal stenosis. Electronically Signed   By: Morgane  Naveau M.D.   On: 04/04/2024 19:22   CT Cervical Spine Wo Contrast Result Date: 04/04/2024 CLINICAL DATA:  Fall EXAM: CT HEAD WITHOUT CONTRAST CT CERVICAL SPINE WITHOUT CONTRAST TECHNIQUE: Multidetector CT imaging of the head and cervical spine was performed  following the standard protocol without intravenous contrast. Multiplanar CT image reconstructions of the cervical spine were also generated. RADIATION DOSE REDUCTION: This exam was performed according to the departmental dose-optimization program which includes automated exposure control, adjustment of the mA and/or kV according to patient size and/or use of iterative reconstruction technique. COMPARISON:  None Available. FINDINGS: CT HEAD FINDINGS Brain: No evidence of large-territorial acute infarction. No parenchymal hemorrhage. No mass lesion. No extra-axial collection. No mass effect or midline shift. No hydrocephalus. Basilar cisterns are patent. Vascular: No hyperdense vessel. Skull: No acute fracture or focal lesion. Sinuses/Orbits: Right sphenoid and maxillary sinus mucosal thickening. Trace partial right mastoid air cell effusion. Otherwise paranasal sinuses and mastoid air cells are clear. Bilateral lens replacement. Otherwise the orbits are unremarkable. Other: None. CT CERVICAL SPINE FINDINGS Alignment: Normal. Skull base and vertebrae: Multilevel moderate severe degenerative changes of the spine. Associated multilevel severe osseous neural foraminal stenosis. No severe osseous central canal stenosis. Cystic changes of the cervical spine appears subchondral and likely degenerative in etiology. no acute fracture. No aggressive appearing focal osseous lesion or focal pathologic process. Soft tissues and spinal canal: No prevertebral fluid or swelling. No visible canal hematoma. Upper chest: Unremarkable. Other: Atherosclerotic plaque of the carotid arteries within the neck. IMPRESSION: 1. No acute intracranial abnormality. 2. No acute displaced fracture or traumatic listhesis of the cervical spine. 3. Multilevel moderate severe degenerative changes of the spine. Associated multilevel severe osseous neural foraminal stenosis. Electronically Signed   By: Morgane  Naveau M.D.   On: 04/04/2024 19:22     Procedures Procedures    Medications Ordered in ED Medications  Tdap (BOOSTRIX ) injection 0.5 mL (0.5 mLs Intramuscular Given 04/04/24 1931)    ED Course/ Medical Decision Making/ A&P                                   89yo male with history of CAD, B12 deficiency, peripheral neuropathy, hypothyroidism who presents with concern for fall.  Given fall with head trauma, mechanism, age, CT head CSpine obtained which show no evidence of intracranial bleed or fracture.    Has good ROM of elbow and denies significant tenderenss and low suspicion for fracture.    Does have abrasions, skin tears.  TDAp updated.  Wound care provided by nursing  staff. Skin tear with area of skin thin that is likely nonviable however will attempt to place over wound, recommend nonstick dressing/xeroform and close PCP follow up/follow up wound care at [facility].  Discussed reasons to return.          Final Clinical Impression(s) / ED Diagnoses Final diagnoses:  Skin tear of right elbow without complication, initial encounter  Fall, initial encounter  Abrasion    Rx / DC Orders ED Discharge Orders     None         Scarlette Currier, MD 04/05/24 1359

## 2024-04-26 ENCOUNTER — Other Ambulatory Visit: Payer: Self-pay | Admitting: Internal Medicine

## 2024-04-26 DIAGNOSIS — I428 Other cardiomyopathies: Secondary | ICD-10-CM

## 2024-04-26 DIAGNOSIS — I2510 Atherosclerotic heart disease of native coronary artery without angina pectoris: Secondary | ICD-10-CM

## 2024-04-30 ENCOUNTER — Encounter: Payer: Self-pay | Admitting: Internal Medicine

## 2024-04-30 ENCOUNTER — Non-Acute Institutional Stay: Admitting: Internal Medicine

## 2024-04-30 VITALS — BP 118/80 | HR 67 | Temp 97.9°F | Resp 20 | Ht 71.0 in | Wt 174.6 lb

## 2024-04-30 DIAGNOSIS — J209 Acute bronchitis, unspecified: Secondary | ICD-10-CM | POA: Diagnosis not present

## 2024-04-30 DIAGNOSIS — D696 Thrombocytopenia, unspecified: Secondary | ICD-10-CM | POA: Diagnosis not present

## 2024-04-30 DIAGNOSIS — R053 Chronic cough: Secondary | ICD-10-CM

## 2024-04-30 DIAGNOSIS — J42 Unspecified chronic bronchitis: Secondary | ICD-10-CM | POA: Diagnosis not present

## 2024-04-30 DIAGNOSIS — E039 Hypothyroidism, unspecified: Secondary | ICD-10-CM

## 2024-04-30 DIAGNOSIS — G609 Hereditary and idiopathic neuropathy, unspecified: Secondary | ICD-10-CM

## 2024-04-30 MED ORDER — PREDNISONE 20 MG PO TABS: 20.0000 mg | ORAL_TABLET | Freq: Every day | ORAL | 0 refills | Status: DC

## 2024-04-30 MED ORDER — DOXYCYCLINE HYCLATE 100 MG PO TABS: 100.0000 mg | ORAL_TABLET | Freq: Two times a day (BID) | ORAL | Status: AC

## 2024-04-30 NOTE — Progress Notes (Signed)
 Location:  [facility] features editor of Service:  Clinic (12)  Provider:   Code Status:  Goals of Care:     04/04/2024    6:34 PM  Advanced Directives  Does Patient Have a Medical Advance Directive? Yes     Chief Complaint  Patient presents with   Cough    HPI: Patient is a 89 y.o. male seen today for medical management of chronic diseases.   [place] Acute issue Cough Patient has h/o Chronic Cough He says recently his cough got worse over past few weeks Coughing Brown and Thick Sputum No Fever SOB present No chest pain His CT Scan in 3/25 had Shown Signs of COPD and Bronchiectasis  S/P Fall With Injury in his Elbow Skin Tear Went to ED It has healed now CT scan of neck showed Stenosis  CT of head no acute changes  Other issues Rectal Incontinence with Constipation  Dysphagia Was seen by GI refused Further work up right now Has h/o Dilatation in 2021   Short Term Memory Changes  Peripheral Neuropathy  neuropathy is described as severe, but maintain mobility with the aid of a cane     He continues to drive, but only for short distances and avoids highways.   history of nonobstructive CAD, nonischemic cardiomyopathy, left bundle branch block and HLD Last echo showed EF of 55 to 60% with mild MR Follows with Cardiology  History of flaccid neurogenic bladder  catheterize at least once or twice in a day Recurrent UTIs follows with urology     History of insomnia with restless leg.  Is on Valium  and Tylenol  PM Pancreatic Cyst CT imaging Stable Recommended No Follow up needed Thrombocytopenia Saw Hematology      Past Medical History:  Diagnosis Date   Alcoholic peripheral neuropathy [facility])    neurologist-  dr patel--- feet numbness and sensory ataxia   Arthritis    B12 deficiency    BPH (benign prostatic hyperplasia)    CAD (coronary artery disease) cardiolgoist-  dr Veryl Gottron end (previouly dr dalton Mitzie Anda)   a. Myoview  11/13:  EF 38%, inf and IS defect c/w scar but no ischemia:  b. Cardiac CT 11/13:  Ca score 318 Agatson units, pLAD and dCFX plaque;   c. LHC 11/07/12:  pLAD 30%, oD1 40%, oCFX 30%, dCFX 40-50%, mRCA 40%, EF 50% (frequent PVCs and short run of NSVT with injection/    per last echo 01/ 2017  ef 55-60%   Chronic constipation    Diverticulosis of colon    Esophageal reflux    External hemorrhoids    First degree heart block    Hiatal hernia    History of adenomatous polyp of colon    History of esophageal stricture 08/11/2015   s/p  dilatation   History of lower GI bleeding 03/01/2017   s/p  flexiable simoidscopy w/ clipping rectum ulcer   Hypogonadism male    Hypothyroidism    Interstitial cystitis    Irritable bowel syndrome    LBBB (left bundle branch block)    NICM (nonischemic cardiomyopathy) [facility]) cardiologist-  dr Veryl Gottron end (previously dr dalton Mitzie Anda)--  per last echo 01/ 2017  ef 55-60%   ? 2/2 LBBB => a. echo 11/13: diff HK, worse in septum and apex, mod LVE, mild LVH, EF 40%, mild AI, mild MR, mod LAE   Pre-diabetes    Self-catheterizes urinary bladder    bid to tid  Wears glasses    Wears hearing aid in both ears     Past Surgical History:  Procedure Laterality Date   CARDIAC CATHETERIZATION  11-07-2012    dr Mitzie Anda   nonobstructive CAD (pLAD 30%, ostial D1 40%, ostial LCx 30%, dLCx diffuse 40-50%, mRCA 40%) ;  LVSF 50% but diffiult due to PVCs and short run VT with injection;  cardiomyopathy mostly likely a LBBB cardiomyopathy   CARDIOVASCULAR STRESS TEST  10-16-2012   dr Mitzie Anda   Low risk adenosine  nuclear study (no exercise) w/ a fixed inferior and inferoseptal defect without ischemia (question as whether this abnormality due to LBBB cardiomyopathy or due to scar)/  LV ef 38%,  LV wall motion decreased of the septum and entire apex   CATARACT EXTRACTION W/ INTRAOCULAR LENS  IMPLANT, BILATERAL  2012  approx.   COLONOSCOPY  2016   Dr. Elvin Hammer, Per new patient form    CYSTO/  HYDRODISTENTION/  BLADDER BIOPSY  04-20-2005    dr Kerby Pearson  [facility]   ETHMOIDECTOMY Right 12/12/2016   Procedure: RIGHT ENDOSCOPIC ETHMOIDECTOMY;  Surgeon: Reynold Caves, MD;  Location: [facility];  Service: ENT;  Laterality: Right;   FLEXIBLE SIGMOIDOSCOPY N/A 03/02/2017   Procedure: FLEXIBLE SIGMOIDOSCOPY;  Surgeon: Asencion Blacksmith, MD;  Location: [facility];  Service: Endoscopy;  Laterality: N/A;   LUMBAR LAMINECTOMY  1952   MAXILLARY ANTROSTOMY Right 12/12/2016   Procedure: RIGHT ENDOSCOPIC MAXILLARY ANTROSTOMY;  Surgeon: Reynold Caves, MD;  Location: [facility];  Service: ENT;  Laterality: Right;   SINUS ENDO W/FUSION Right 12/12/2016   Procedure: ENDOSCOPIC SINUS SURGERY WITH NAVIGATION;  Surgeon: Reynold Caves, MD;  Location: [facility];  Service: ENT;  Laterality: Right;   SPHENOIDECTOMY Right 12/12/2016   Procedure: RIGHT ENDOSCOPIC SPHENOIDECTOMY;  Surgeon: Reynold Caves, MD;  Location: [facility];  Service: ENT;  Laterality: Right;   TONSILLECTOMY AND ADENOIDECTOMY  child   TOTAL KNEE ARTHROPLASTY Left 10/14/2014   Procedure: LEFT TOTAL KNEE ARTHROPLASTY;  Surgeon: Bevin Bucks, MD;  Location: [facility];  Service: Orthopedics;  Laterality: Left;   TOTAL KNEE ARTHROPLASTY Right 1990s   TRANSTHORACIC ECHOCARDIOGRAM  12-24-2015   dr Mitzie Anda   ef 55-60%,  grade 1 diastolic dysfunction/  trivial AR and TR  mild MR and PR/  moderate LAE   TRANSURETHRAL RESECTION OF PROSTATE  06-11-2010   dr Isla Mari  [facility]   w/  GYRUS   TRANSURETHRAL RESECTION OF PROSTATE N/A 12/06/2017   Procedure: TRANSURETHRAL RESECTION OF THE PROSTATE (TURP)/ BIPOLAR;  Surgeon: Adelbert Homans, MD;  Location: [facility];  Service: Urology;  Laterality: N/A;   TRANSURETHRAL RESECTION OF PROSTATE N/A 04/11/2018   Procedure: TRANSURETHRAL RESECTION OF THE PROSTATE (TURP);  Surgeon: Adelbert Homans, MD;  Location: [facility];  Service: Urology;   Laterality: N/A;   UPPER GASTROINTESTINAL ENDOSCOPY      Allergies  Allergen Reactions   Cyclobenzaprine Other (See Comments)    Lost balance   Lisinopril  Other (See Comments)    Affected ability to urinate   Relafen [Nabumetone] Other (See Comments)    Problems urinating afterwards    Statins Other (See Comments)    "my peeing"    Outpatient Encounter Medications as of 04/30/2024  Medication Sig   AZO-CRANBERRY PO Take 1 tablet by mouth daily.   carvedilol  (COREG ) 6.25 MG tablet TAKE 1 TABLET BY MOUTH TWICE A DAY   ciprofloxacin  (CIPRO ) 500 MG tablet Take 500  mg by mouth 2 (two) times daily as needed.   Cyanocobalamin  (VITAMIN B12) 1000 MCG TBCR Take 1,000 mcg by mouth.   diazepam  (VALIUM ) 5 MG tablet TAKE 1/2 TABLET BY MOUTH 2 (TWO) TIMES DAILY. 1/2 TABLET EVERY 2-3 DAYS   gabapentin  (NEURONTIN ) 300 MG capsule TAKE 1 CAPSULE EVERY DAY   levothyroxine  (SYNTHROID ) 25 MCG tablet Take 1 tablet (25 mcg total) by mouth daily before breakfast. Tuesday Thursday Friday Saturday   levothyroxine  (SYNTHROID ) 50 MCG tablet TAKE 1 TABLET (50 MCG TOTAL) BY MOUTH DAILY BEFORE BREAKFAST. MONDAY WEDNESDAY SUNDAY   omeprazole  (PRILOSEC) 20 MG capsule TAKE 1 CAPSULE BY MOUTH EVERY DAY   polyethylene glycol (MIRALAX  / GLYCOLAX ) packet Take 17 g by mouth daily as needed for mild constipation.   Probiotic Product (PROBIOTIC PO) Take 1 capsule by mouth daily.   trimethoprim  (TRIMPEX ) 100 MG tablet Take 100 mg by mouth 2 (two) times daily.   No facility-administered encounter medications on file as of 04/30/2024.    Review of Systems:  Review of Systems  Constitutional:  Negative for activity change, appetite change and unexpected weight change.  HENT: Negative.    Respiratory:  Positive for cough and shortness of breath.   Cardiovascular:  Negative for leg swelling.  Gastrointestinal:  Positive for constipation.  Genitourinary:  Positive for difficulty urinating. Negative for frequency.   Musculoskeletal:  Positive for gait problem. Negative for arthralgias and myalgias.  Skin: Negative.  Negative for rash.  Neurological:  Negative for dizziness and weakness.  Psychiatric/Behavioral:  Negative for confusion and sleep disturbance.   All other systems reviewed and are negative.   Health Maintenance  Topic Date Due   COVID-19 Vaccine (6 - 2024-25 season) 08/06/2023   Medicare Annual Wellness (AWV)  04/09/2024   INFLUENZA VACCINE  07/05/2024   DTaP/Tdap/Td (6 - Td or Tdap) 04/04/2034   Pneumonia Vaccine 34+ Years old  Completed   HPV VACCINES  Aged Out   Meningococcal B Vaccine  Aged Out   Colonoscopy  Discontinued   Zoster Vaccines- Shingrix  Discontinued    Physical Exam: Vitals:   04/30/24 0810  BP: 118/80  Pulse: 67  Resp: 20  Temp: 97.9 F (36.6 C)  SpO2: 95%  Weight: 174 lb 9.6 oz (79.2 kg)  Height: 5\' 11"  (1.803 m)   Body mass index is 24.35 kg/m. Physical Exam Vitals reviewed.  Constitutional:      Appearance: Normal appearance.  HENT:     Head: Normocephalic.     Nose: Nose normal.     Mouth/Throat:     Mouth: Mucous membranes are moist.     Pharynx: Oropharynx is clear.  Eyes:     Pupils: Pupils are equal, round, and reactive to light.  Cardiovascular:     Rate and Rhythm: Normal rate and regular rhythm.     Pulses: Normal pulses.     Heart sounds: No murmur heard. Pulmonary:     Effort: Pulmonary effort is normal. No respiratory distress.     Breath sounds: Rales present. No wheezing.  Abdominal:     General: Abdomen is flat. Bowel sounds are normal.     Palpations: Abdomen is soft.  Musculoskeletal:        General: No swelling.     Cervical back: Neck supple.  Skin:    General: Skin is warm.  Neurological:     Mental Status: He is alert and oriented to person, place, and time.  Psychiatric:  Mood and Affect: Mood normal.        Thought Content: Thought content normal.     Labs reviewed: Basic Metabolic  Panel: Recent Labs    07/06/23 1123 07/25/23 0000 02/08/24 0905  NA 138  --   --   K 4.3  --   --   CL 104  --   --   CO2 29  --   --   GLUCOSE 90  --   --   BUN 11  --   --   CREATININE 0.62  --  0.70  CALCIUM  9.1  --   --   TSH  --  8.70*  --    Liver Function Tests: Recent Labs    07/06/23 1123  AST 14*  ALT 8  ALKPHOS 67  BILITOT 0.9  PROT 6.3*  ALBUMIN 4.0   No results for input(s): "LIPASE", "AMYLASE" in the last 8760 hours. No results for input(s): "AMMONIA" in the last 8760 hours. CBC: Recent Labs    07/06/23 1123 03/05/24 1130  WBC 3.9* 3.9*  NEUTROABS 1.8 1.5*  HGB 12.2* 12.1*  HCT 37.8* 38.0*  MCV 94.5 95.5  PLT 106* 90*   Lipid Panel: No results for input(s): "CHOL", "HDL", "LDLCALC", "TRIG", "CHOLHDL", "LDLDIRECT" in the last 8760 hours. Lab Results  Component Value Date   HGBA1C 5.2 04/10/2018    Procedures since last visit: CT Head Wo Contrast Result Date: 04/04/2024 CLINICAL DATA:  Fall EXAM: CT HEAD WITHOUT CONTRAST CT CERVICAL SPINE WITHOUT CONTRAST TECHNIQUE: Multidetector CT imaging of the head and cervical spine was performed following the standard protocol without intravenous contrast. Multiplanar CT image reconstructions of the cervical spine were also generated. RADIATION DOSE REDUCTION: This exam was performed according to the departmental dose-optimization program which includes automated exposure control, adjustment of the mA and/or kV according to patient size and/or use of iterative reconstruction technique. COMPARISON:  None Available. FINDINGS: CT HEAD FINDINGS Brain: No evidence of large-territorial acute infarction. No parenchymal hemorrhage. No mass lesion. No extra-axial collection. No mass effect or midline shift. No hydrocephalus. Basilar cisterns are patent. Vascular: No hyperdense vessel. Skull: No acute fracture or focal lesion. Sinuses/Orbits: Right sphenoid and maxillary sinus mucosal thickening. Trace partial right mastoid  air cell effusion. Otherwise paranasal sinuses and mastoid air cells are clear. Bilateral lens replacement. Otherwise the orbits are unremarkable. Other: None. CT CERVICAL SPINE FINDINGS Alignment: Normal. Skull base and vertebrae: Multilevel moderate severe degenerative changes of the spine. Associated multilevel severe osseous neural foraminal stenosis. No severe osseous central canal stenosis. Cystic changes of the cervical spine appears subchondral and likely degenerative in etiology. no acute fracture. No aggressive appearing focal osseous lesion or focal pathologic process. Soft tissues and spinal canal: No prevertebral fluid or swelling. No visible canal hematoma. Upper chest: Unremarkable. Other: Atherosclerotic plaque of the carotid arteries within the neck. IMPRESSION: 1. No acute intracranial abnormality. 2. No acute displaced fracture or traumatic listhesis of the cervical spine. 3. Multilevel moderate severe degenerative changes of the spine. Associated multilevel severe osseous neural foraminal stenosis. Electronically Signed   By: Morgane  Naveau M.D.   On: 04/04/2024 19:22   CT Cervical Spine Wo Contrast Result Date: 04/04/2024 CLINICAL DATA:  Fall EXAM: CT HEAD WITHOUT CONTRAST CT CERVICAL SPINE WITHOUT CONTRAST TECHNIQUE: Multidetector CT imaging of the head and cervical spine was performed following the standard protocol without intravenous contrast. Multiplanar CT image reconstructions of the cervical spine were also generated. RADIATION DOSE REDUCTION: This exam was  performed according to the departmental dose-optimization program which includes automated exposure control, adjustment of the mA and/or kV according to patient size and/or use of iterative reconstruction technique. COMPARISON:  None Available. FINDINGS: CT HEAD FINDINGS Brain: No evidence of large-territorial acute infarction. No parenchymal hemorrhage. No mass lesion. No extra-axial collection. No mass effect or midline shift. No  hydrocephalus. Basilar cisterns are patent. Vascular: No hyperdense vessel. Skull: No acute fracture or focal lesion. Sinuses/Orbits: Right sphenoid and maxillary sinus mucosal thickening. Trace partial right mastoid air cell effusion. Otherwise paranasal sinuses and mastoid air cells are clear. Bilateral lens replacement. Otherwise the orbits are unremarkable. Other: None. CT CERVICAL SPINE FINDINGS Alignment: Normal. Skull base and vertebrae: Multilevel moderate severe degenerative changes of the spine. Associated multilevel severe osseous neural foraminal stenosis. No severe osseous central canal stenosis. Cystic changes of the cervical spine appears subchondral and likely degenerative in etiology. no acute fracture. No aggressive appearing focal osseous lesion or focal pathologic process. Soft tissues and spinal canal: No prevertebral fluid or swelling. No visible canal hematoma. Upper chest: Unremarkable. Other: Atherosclerotic plaque of the carotid arteries within the neck. IMPRESSION: 1. No acute intracranial abnormality. 2. No acute displaced fracture or traumatic listhesis of the cervical spine. 3. Multilevel moderate severe degenerative changes of the spine. Associated multilevel severe osseous neural foraminal stenosis. Electronically Signed   By: Morgane  Naveau M.D.   On: 04/04/2024 19:22    Assessment/Plan 1. Chronic bronchitis with acute exacerbation (HCC) (Primary) Doxycyline 7 days Prednisone  7 days Follow up If not better Referral to Pulmonary ? Would need chronic Inhalers  2. Chronic cough   3. Peripheral neuropathy, idiopathic Neurology Uses Valium  Rarely and on Gabapentin  4. Hypothyroidism, unspecified type Needs repeat TSH  5. Thrombocytopenia (HCC) Follows with Hematology  Other issues  NICM (nonischemic cardiomyopathy) (HCC) Aspirin  and Coreg  GERD Prilosec Flaccid neuropathic bladder, not elsewhere classified Uses In and Out cath  Trimpex  Pancreatic cyst No  Folloow up needed     Labs/tests ordered:   Next appt:  05/14/2024

## 2024-05-05 DIAGNOSIS — N312 Flaccid neuropathic bladder, not elsewhere classified: Secondary | ICD-10-CM | POA: Diagnosis not present

## 2024-05-05 DIAGNOSIS — N139 Obstructive and reflux uropathy, unspecified: Secondary | ICD-10-CM | POA: Diagnosis not present

## 2024-05-05 DIAGNOSIS — R3914 Feeling of incomplete bladder emptying: Secondary | ICD-10-CM | POA: Diagnosis not present

## 2024-05-07 ENCOUNTER — Other Ambulatory Visit: Payer: Self-pay | Admitting: Internal Medicine

## 2024-05-07 DIAGNOSIS — I2510 Atherosclerotic heart disease of native coronary artery without angina pectoris: Secondary | ICD-10-CM

## 2024-05-07 DIAGNOSIS — I428 Other cardiomyopathies: Secondary | ICD-10-CM

## 2024-05-14 ENCOUNTER — Encounter: Admitting: Internal Medicine

## 2024-05-14 ENCOUNTER — Encounter: Payer: Self-pay | Admitting: Internal Medicine

## 2024-05-14 ENCOUNTER — Non-Acute Institutional Stay: Admitting: Internal Medicine

## 2024-05-14 VITALS — BP 120/60 | HR 70 | Temp 97.7°F | Ht 71.0 in | Wt 176.4 lb

## 2024-05-14 DIAGNOSIS — J209 Acute bronchitis, unspecified: Secondary | ICD-10-CM

## 2024-05-14 DIAGNOSIS — D696 Thrombocytopenia, unspecified: Secondary | ICD-10-CM

## 2024-05-14 DIAGNOSIS — E039 Hypothyroidism, unspecified: Secondary | ICD-10-CM | POA: Diagnosis not present

## 2024-05-14 DIAGNOSIS — J42 Unspecified chronic bronchitis: Secondary | ICD-10-CM | POA: Diagnosis not present

## 2024-05-14 DIAGNOSIS — N138 Other obstructive and reflux uropathy: Secondary | ICD-10-CM

## 2024-05-14 DIAGNOSIS — R1310 Dysphagia, unspecified: Secondary | ICD-10-CM

## 2024-05-14 DIAGNOSIS — G609 Hereditary and idiopathic neuropathy, unspecified: Secondary | ICD-10-CM | POA: Diagnosis not present

## 2024-05-14 DIAGNOSIS — N401 Enlarged prostate with lower urinary tract symptoms: Secondary | ICD-10-CM

## 2024-05-14 DIAGNOSIS — K219 Gastro-esophageal reflux disease without esophagitis: Secondary | ICD-10-CM | POA: Diagnosis not present

## 2024-05-14 MED ORDER — FLUTICASONE FUROATE-VILANTEROL 200-25 MCG/ACT IN AEPB: 1.0000 | INHALATION_SPRAY | Freq: Every day | RESPIRATORY_TRACT | 11 refills | Status: AC

## 2024-05-14 NOTE — Progress Notes (Signed)
 Location: [facility] features editor of Service:  Clinic (12)  Provider:   Code Status:  Goals of Care:     04/04/2024    6:34 PM  Advanced Directives  Does Patient Have a Medical Advance Directive? Yes     Chief Complaint  Patient presents with   Follow-up    2 week follow up    HPI: Patient is a 89 y.o. male seen today for an acute visit for Cough   Lives in [place] in [place]  Cough Patient has h/o Chronic Cough Last week he was here c/o his cough worse with Coughing Brown and Thick Sputum No Fever SOB present No chest pain His CT Scan in 3/25 had Shown Signs of COPD and Bronchiectasis I had given him Prednisone  and Doxy Today he says he feels much better Still has some Productive Cough  Left Wrist Pain Has Seen Dr Hermina Loosen before and had it injected which helped  Feels Fatigue easily and tired Uses Cane and walker to walk Does have power chair  Other issues Rectal Incontinence with Constipation   Dysphagia Was seen by GI refused Further work up right now Has h/o Dilatation in 2021    Short Term Memory Changes   Peripheral Neuropathy  neuropathy is described as severe, but maintain mobility with the aid of a cane      He continues to drive, but only for short distances and avoids highways.    history of nonobstructive CAD, nonischemic cardiomyopathy, left bundle branch block and HLD Last echo showed EF of 55 to 60% with mild MR Follows with Cardiology   History of flaccid neurogenic bladder  catheterize at least once or twice in a day Recurrent UTIs follows with urology     History of insomnia with restless leg.  Is on Valium  and Tylenol  PM Pancreatic Cyst CT imaging Stable Recommended No Follow up needed Thrombocytopenia Saw Hematology        Past Medical History:  Diagnosis Date   Alcoholic peripheral neuropathy [facility])    neurologist-  dr patel--- feet numbness and sensory ataxia   Arthritis    B12 deficiency    BPH (benign  prostatic hyperplasia)    CAD (coronary artery disease) cardiolgoist-  dr Veryl Gottron end (previouly dr dalton Mitzie Anda)   a. Myoview 11/13:  EF 38%, inf and IS defect c/w scar but no ischemia:  b. Cardiac CT 11/13:  Ca score 318 Agatson units, pLAD and dCFX plaque;   c. LHC 11/07/12:  pLAD 30%, oD1 40%, oCFX 30%, dCFX 40-50%, mRCA 40%, EF 50% (frequent PVCs and short run of NSVT with injection/    per last echo 01/ 2017  ef 55-60%   Chronic constipation    Diverticulosis of colon    Esophageal reflux    External hemorrhoids    First degree heart block    Hiatal hernia    History of adenomatous polyp of colon    History of esophageal stricture 08/11/2015   s/p  dilatation   History of lower GI bleeding 03/01/2017   s/p  flexiable simoidscopy w/ clipping rectum ulcer   Hypogonadism male    Hypothyroidism    Interstitial cystitis    Irritable bowel syndrome    LBBB (left bundle branch block)    NICM (nonischemic cardiomyopathy) [facility]) cardiologist-  dr Veryl Gottron end (previously dr dalton Mitzie Anda)--  per last echo 01/ 2017  ef 55-60%   ? 2/2 LBBB => a. echo 11/13: diff HK, worse in  septum and apex, mod LVE, mild LVH, EF 40%, mild AI, mild MR, mod LAE   Pre-diabetes    Self-catheterizes urinary bladder    bid to tid    Wears glasses    Wears hearing aid in both ears     Past Surgical History:  Procedure Laterality Date   CARDIAC CATHETERIZATION  11-07-2012    dr Mitzie Anda   nonobstructive CAD (pLAD 30%, ostial D1 40%, ostial LCx 30%, dLCx diffuse 40-50%, mRCA 40%) ;  LVSF 50% but diffiult due to PVCs and short run VT with injection;  cardiomyopathy mostly likely a LBBB cardiomyopathy   CARDIOVASCULAR STRESS TEST  10-16-2012   dr Mitzie Anda   Low risk adenosine  nuclear study (no exercise) w/ a fixed inferior and inferoseptal defect without ischemia (question as whether this abnormality due to LBBB cardiomyopathy or due to scar)/  LV ef 38%,  LV wall motion decreased of the septum and entire apex    CATARACT EXTRACTION W/ INTRAOCULAR LENS  IMPLANT, BILATERAL  2012  approx.   COLONOSCOPY  2016   Dr. Elvin Hammer, Per new patient form   CYSTO/  HYDRODISTENTION/  BLADDER BIOPSY  04-20-2005    dr Kerby Pearson  [facility]   ETHMOIDECTOMY Right 12/12/2016   Procedure: RIGHT ENDOSCOPIC ETHMOIDECTOMY;  Surgeon: Reynold Caves, MD;  Location: [facility];  Service: ENT;  Laterality: Right;   FLEXIBLE SIGMOIDOSCOPY N/A 03/02/2017   Procedure: FLEXIBLE SIGMOIDOSCOPY;  Surgeon: Asencion Blacksmith, MD;  Location: [facility];  Service: Endoscopy;  Laterality: N/A;   LUMBAR LAMINECTOMY  1952   MAXILLARY ANTROSTOMY Right 12/12/2016   Procedure: RIGHT ENDOSCOPIC MAXILLARY ANTROSTOMY;  Surgeon: Reynold Caves, MD;  Location: [facility];  Service: ENT;  Laterality: Right;   SINUS ENDO W/FUSION Right 12/12/2016   Procedure: ENDOSCOPIC SINUS SURGERY WITH NAVIGATION;  Surgeon: Reynold Caves, MD;  Location: [facility];  Service: ENT;  Laterality: Right;   SPHENOIDECTOMY Right 12/12/2016   Procedure: RIGHT ENDOSCOPIC SPHENOIDECTOMY;  Surgeon: Reynold Caves, MD;  Location: [facility];  Service: ENT;  Laterality: Right;   TONSILLECTOMY AND ADENOIDECTOMY  child   TOTAL KNEE ARTHROPLASTY Left 10/14/2014   Procedure: LEFT TOTAL KNEE ARTHROPLASTY;  Surgeon: Bevin Bucks, MD;  Location: [facility];  Service: Orthopedics;  Laterality: Left;   TOTAL KNEE ARTHROPLASTY Right 1990s   TRANSTHORACIC ECHOCARDIOGRAM  12-24-2015   dr Mitzie Anda   ef 55-60%,  grade 1 diastolic dysfunction/  trivial AR and TR  mild MR and PR/  moderate LAE   TRANSURETHRAL RESECTION OF PROSTATE  06-11-2010   dr Isla Mari  [facility]   w/  GYRUS   TRANSURETHRAL RESECTION OF PROSTATE N/A 12/06/2017   Procedure: TRANSURETHRAL RESECTION OF THE PROSTATE (TURP)/ BIPOLAR;  Surgeon: Adelbert Homans, MD;  Location: [facility];  Service: Urology;  Laterality: N/A;   TRANSURETHRAL RESECTION OF PROSTATE N/A 04/11/2018    Procedure: TRANSURETHRAL RESECTION OF THE PROSTATE (TURP);  Surgeon: Adelbert Homans, MD;  [facility];  Service: Urology;  Laterality: N/A;   UPPER GASTROINTESTINAL ENDOSCOPY      Allergies  Allergen Reactions   Cyclobenzaprine Other (See Comments)    Lost balance   Lisinopril  Other (See Comments)    Affected ability to urinate   Relafen [Nabumetone] Other (See Comments)    Problems urinating afterwards    Statins Other (See Comments)    "my peeing"    Outpatient Encounter Medications as of 05/14/2024  Medication Sig  AZO-CRANBERRY PO Take 1 tablet by mouth daily.   carvedilol  (COREG ) 6.25 MG tablet TAKE 1 TABLET BY MOUTH TWICE A DAY   ciprofloxacin  (CIPRO ) 500 MG tablet Take 500 mg by mouth 2 (two) times daily as needed.   Cyanocobalamin  (VITAMIN B12) 1000 MCG TBCR Take 1,000 mcg by mouth.   diazepam  (VALIUM ) 5 MG tablet TAKE 1/2 TABLET BY MOUTH 2 (TWO) TIMES DAILY. 1/2 TABLET EVERY 2-3 DAYS   fluticasone furoate-vilanterol (BREO ELLIPTA) 200-25 MCG/ACT AEPB Inhale 1 puff into the lungs daily. With Spacer   gabapentin  (NEURONTIN ) 300 MG capsule TAKE 1 CAPSULE EVERY DAY   levothyroxine  (SYNTHROID ) 25 MCG tablet Take 1 tablet (25 mcg total) by mouth daily before breakfast. Tuesday Thursday Friday Saturday   levothyroxine  (SYNTHROID ) 50 MCG tablet TAKE 1 TABLET (50 MCG TOTAL) BY MOUTH DAILY BEFORE BREAKFAST. MONDAY WEDNESDAY SUNDAY   polyethylene glycol (MIRALAX  / GLYCOLAX ) packet Take 17 g by mouth daily as needed for mild constipation.   predniSONE  (DELTASONE ) 20 MG tablet Take 1 tablet (20 mg total) by mouth daily with breakfast.   Probiotic Product (PROBIOTIC PO) Take 1 capsule by mouth daily.   trimethoprim  (TRIMPEX ) 100 MG tablet Take 100 mg by mouth 2 (two) times daily.   omeprazole  (PRILOSEC) 20 MG capsule TAKE 1 CAPSULE BY MOUTH EVERY DAY (Patient not taking: Reported on 05/14/2024)   No facility-administered encounter medications on file as of 05/14/2024.     Review of Systems:  Review of Systems  Constitutional:  Negative for activity change, appetite change and unexpected weight change.  HENT: Negative.    Respiratory:  Positive for cough. Negative for shortness of breath.   Cardiovascular:  Negative for leg swelling.  Gastrointestinal:  Negative for constipation.  Genitourinary:  Negative for frequency.  Musculoskeletal:  Positive for gait problem. Negative for arthralgias and myalgias.  Skin:  Positive for color change. Negative for rash.  Neurological:  Negative for dizziness and weakness.  Hematological:  Bruises/bleeds easily.  Psychiatric/Behavioral:  Negative for confusion and sleep disturbance.   All other systems reviewed and are negative.   Health Maintenance  Topic Date Due   Medicare Annual Wellness (AWV)  04/09/2024   COVID-19 Vaccine (6 - 2024-25 season) 05/30/2024 (Originally 08/06/2023)   INFLUENZA VACCINE  07/05/2024   DTaP/Tdap/Td (6 - Td or Tdap) 04/04/2034   Pneumonia Vaccine 37+ Years old  Completed   HPV VACCINES  Aged Out   Meningococcal B Vaccine  Aged Out   Colonoscopy  Discontinued   Zoster Vaccines- Shingrix  Discontinued    Physical Exam: Vitals:   05/14/24 0829  BP: 120/60  Pulse: 70  Temp: 97.7 F (36.5 C)  SpO2: 98%  Weight: 176 lb 6.4 oz (80 kg)  Height: 5\' 11"  (1.803 m)   Body mass index is 24.6 kg/m. Physical Exam Vitals reviewed.  Constitutional:      Appearance: Normal appearance.  HENT:     Head: Normocephalic.     Nose: Nose normal.     Mouth/Throat:     Mouth: Mucous membranes are moist.     Pharynx: Oropharynx is clear.  Eyes:     Pupils: Pupils are equal, round, and reactive to light.  Cardiovascular:     Rate and Rhythm: Normal rate and regular rhythm.     Pulses: Normal pulses.     Heart sounds: No murmur heard. Pulmonary:     Effort: Pulmonary effort is normal. No respiratory distress.     Breath sounds: Normal breath sounds.  No rales.     Comments: Some rales  Present but no wheezing Abdominal:     General: Abdomen is flat. Bowel sounds are normal.     Palpations: Abdomen is soft.  Musculoskeletal:        General: No swelling.     Cervical back: Neck supple.     Comments: Mild edema due to Chronic Venouis changes Mild swelling in his left Wrist but not Painful  Skin:    General: Skin is warm.     Findings: Bruising present.     Comments: Bruising in his Legs   Neurological:     Mental Status: He is alert and oriented to person, place, and time.  Psychiatric:        Mood and Affect: Mood normal.        Thought Content: Thought content normal.     Labs reviewed: Basic Metabolic Panel: Recent Labs    07/06/23 1123 07/25/23 0000 09/26/23 0000 02/08/24 0905  NA 138  --   --   --   K 4.3  --   --   --   CL 104  --   --   --   CO2 29  --   --   --   GLUCOSE 90  --   --   --   BUN 11  --   --   --   CREATININE 0.62  --   --  0.70  CALCIUM  9.1  --   --   --   TSH  --  8.70* 5.58  --    Liver Function Tests: Recent Labs    07/06/23 1123  AST 14*  ALT 8  ALKPHOS 67  BILITOT 0.9  PROT 6.3*  ALBUMIN 4.0   No results for input(s): "LIPASE", "AMYLASE" in the last 8760 hours. No results for input(s): "AMMONIA" in the last 8760 hours. CBC: Recent Labs    07/06/23 1123 03/05/24 1130  WBC 3.9* 3.9*  NEUTROABS 1.8 1.5*  HGB 12.2* 12.1*  HCT 37.8* 38.0*  MCV 94.5 95.5  PLT 106* 90*   Lipid Panel: No results for input(s): "CHOL", "HDL", "LDLCALC", "TRIG", "CHOLHDL", "LDLDIRECT" in the last 8760 hours. Lab Results  Component Value Date   HGBA1C 5.2 04/10/2018    Procedures since last visit: No results found.  Assessment/Plan 1. Chronic bronchitis with acute exacerbation (HCC) (Primary) Feeling much better on Short Course of Prednisone  and Doxy We discussed about going to Pulmonary but he wants to wait right now Will start him on Breo and see if it helps with his cough  2. Peripheral neuropathy, idiopathic Follows  with Neurology On Gabapentin  Uses Valium  Rarely  3. Hypothyroidism, unspecified type Need to repeat Labs before next visit  4. Thrombocytopenia (HCC) Follows with Dr Scherrie Curt  5. Dysphagia, unspecified type On Prilosec  6. BPH with urinary obstruction Needs Self Catherization  7. Gastroesophageal reflux disease without esophagitis Prilosec 8 Left Wrist Pain He will call Dr Hermina Loosen Office  NICM (nonischemic cardiomyopathy) (HCC) Aspirin  and Coreg   Flaccid neuropathic bladder, not elsewhere classified Uses In and Out cath  Trimpex  Pancreatic cyst No Folloow up needed     Labs/tests ordered:  Labs before next visit Next appt:  06/03/2024

## 2024-05-15 ENCOUNTER — Ambulatory Visit (HOSPITAL_BASED_OUTPATIENT_CLINIC_OR_DEPARTMENT_OTHER)

## 2024-05-15 ENCOUNTER — Ambulatory Visit (HOSPITAL_BASED_OUTPATIENT_CLINIC_OR_DEPARTMENT_OTHER): Admitting: Orthopaedic Surgery

## 2024-05-15 DIAGNOSIS — M25532 Pain in left wrist: Secondary | ICD-10-CM

## 2024-05-15 DIAGNOSIS — M79642 Pain in left hand: Secondary | ICD-10-CM | POA: Diagnosis not present

## 2024-05-15 DIAGNOSIS — M1812 Unilateral primary osteoarthritis of first carpometacarpal joint, left hand: Secondary | ICD-10-CM | POA: Diagnosis not present

## 2024-05-15 NOTE — Progress Notes (Signed)
 Chief Complaint: Left wrist pain     History of Present Illness:   05/15/2024: Left wrist pain.  Presents for follow-up and for injection into the left wrist.  David Gutierrez is a 89 y.o. male presents today with 3 to 5 years of ongoing left wrist pain that is worse at the end of the day.  He states that 3 years ago he had to stop playing golf as a result of this.  He states the end of a long day he feels weakness with grip and pain predominantly involving the distal aspect of the wrist.  He has not been taking any anti-inflammatories for this.  He is currently seeing Dr. Sonda Dural for ongoing management of thrombocytopenia.  This has been chronic in nature.     Surgical History:   None  PMH/PSH/Family History/Social History/Meds/Allergies:    Past Medical History:  Diagnosis Date  . Alcoholic peripheral neuropathy [facility])    neurologist-  dr patel--- feet numbness and sensory ataxia  . Arthritis   . B12 deficiency   . BPH (benign prostatic hyperplasia)   . CAD (coronary artery disease) cardiolgoist-  dr Veryl Gottron end (previouly dr dalton mclean)   a. Myoview 11/13:  EF 38%, inf and IS defect c/w scar but no ischemia:  b. Cardiac CT 11/13:  Ca score 318 Agatson units, pLAD and dCFX plaque;   c. LHC 11/07/12:  pLAD 30%, oD1 40%, oCFX 30%, dCFX 40-50%, mRCA 40%, EF 50% (frequent PVCs and short run of NSVT with injection/    per last echo 01/ 2017  ef 55-60%  . Chronic constipation   . Diverticulosis of colon   . Esophageal reflux   . External hemorrhoids   . First degree heart block   . Hiatal hernia   . History of adenomatous polyp of colon   . History of esophageal stricture 08/11/2015   s/p  dilatation  . History of lower GI bleeding 03/01/2017   s/p  flexiable simoidscopy w/ clipping rectum ulcer  . Hypogonadism male   . Hypothyroidism   . Interstitial cystitis   . Irritable bowel syndrome   . LBBB (left bundle branch block)   . NICM  (nonischemic cardiomyopathy) [facility]) cardiologist-  dr Veryl Gottron end (previously dr dalton Mitzie Anda)--  per last echo 01/ 2017  ef 55-60%   ? 2/2 LBBB => a. echo 11/13: diff HK, worse in septum and apex, mod LVE, mild LVH, EF 40%, mild AI, mild MR, mod LAE  . Pre-diabetes   . Self-catheterizes urinary bladder    bid to tid   . Wears glasses   . Wears hearing aid in both ears    Past Surgical History:  Procedure Laterality Date  . CARDIAC CATHETERIZATION  11-07-2012    dr Mitzie Anda   nonobstructive CAD (pLAD 30%, ostial D1 40%, ostial LCx 30%, dLCx diffuse 40-50%, mRCA 40%) ;  LVSF 50% but diffiult due to PVCs and short run VT with injection;  cardiomyopathy mostly likely a LBBB cardiomyopathy  . CARDIOVASCULAR STRESS TEST  10-16-2012   dr Mitzie Anda   Low risk adenosine  nuclear study (no exercise) w/ a fixed inferior and inferoseptal defect without ischemia (question as whether this abnormality due to LBBB cardiomyopathy or due to scar)/  LV ef 38%,  LV wall motion decreased of the septum and  entire apex  . CATARACT EXTRACTION W/ INTRAOCULAR LENS  IMPLANT, BILATERAL  2012  approx.  . COLONOSCOPY  2016   Dr. Elvin Hammer, Per new patient form  . CYSTO/  HYDRODISTENTION/  BLADDER BIOPSY  04-20-2005    dr Kerby Pearson  [facility]  . ETHMOIDECTOMY Right 12/12/2016   Procedure: RIGHT ENDOSCOPIC ETHMOIDECTOMY;  Surgeon: Reynold Caves, MD;  Location: [facility];  Service: ENT;  Laterality: Right;  . FLEXIBLE SIGMOIDOSCOPY N/A 03/02/2017   Procedure: FLEXIBLE SIGMOIDOSCOPY;  Surgeon: Asencion Blacksmith, MD;  [facility];  Service: Endoscopy;  Laterality: N/A;  . LUMBAR LAMINECTOMY  1952  . MAXILLARY ANTROSTOMY Right 12/12/2016   Procedure: RIGHT ENDOSCOPIC MAXILLARY ANTROSTOMY;  Surgeon: Reynold Caves, MD;  Location: [facility];  Service: ENT;  Laterality: Right;  . SINUS ENDO W/FUSION Right 12/12/2016   Procedure: ENDOSCOPIC SINUS SURGERY WITH NAVIGATION;  Surgeon: Reynold Caves, MD;  Location: [facility];  Service: ENT;  Laterality: Right;  . SPHENOIDECTOMY Right 12/12/2016   Procedure: RIGHT ENDOSCOPIC SPHENOIDECTOMY;  Surgeon: Reynold Caves, MD;  Location: [facility];  Service: ENT;  Laterality: Right;  . TONSILLECTOMY AND ADENOIDECTOMY  child  . TOTAL KNEE ARTHROPLASTY Left 10/14/2014   Procedure: LEFT TOTAL KNEE ARTHROPLASTY;  Surgeon: Bevin Bucks, MD;  [facility];  Service: Orthopedics;  Laterality: Left;  . TOTAL KNEE ARTHROPLASTY Right 1990s  . TRANSTHORACIC ECHOCARDIOGRAM  12-24-2015   dr Mitzie Anda   ef 55-60%,  grade 1 diastolic dysfunction/  trivial AR and TR  mild MR and PR/  moderate LAE  . TRANSURETHRAL RESECTION OF PROSTATE  06-11-2010   dr Isla Mari  [facility]   w/  GYRUS  . TRANSURETHRAL RESECTION OF PROSTATE N/A 12/06/2017   Procedure: TRANSURETHRAL RESECTION OF THE PROSTATE (TURP)/ BIPOLAR;  Surgeon: Adelbert Homans, MD;  Location: [facility];  Service: Urology;  Laterality: N/A;  . TRANSURETHRAL RESECTION OF PROSTATE N/A 04/11/2018   Procedure: TRANSURETHRAL RESECTION OF THE PROSTATE (TURP);  Surgeon: Adelbert Homans, MD;  [facility];  Service: Urology;  Laterality: N/A;  . UPPER GASTROINTESTINAL ENDOSCOPY     Social History   Socioeconomic History  . Marital status: Married    Spouse name: Not on file  . Number of children: 3  . Years of education: Not on file  . Highest education level: Not on file  Occupational History  . Occupation: retired  Tobacco Use  . Smoking status: Former    Current packs/day: 0.00    Average packs/day: 2.0 packs/day for 15.0 years (30.0 ttl pk-yrs)    Types: Cigarettes    Start date: 11/29/1952    Quit date: 11/30/1967    Years since quitting: 56.4  . Smokeless tobacco: Never  Vaping Use  . Vaping status: Never Used  Substance and Sexual Activity  . Alcohol  use: Yes    Alcohol /week: 2.0 standard drinks of alcohol     Types: 2 Standard drinks or equivalent  per week    Comment: occasional  . Drug use: No  . Sexual activity: Yes  Other Topics Concern  . Not on file  Social History Narrative   Do you drink/eat things with caffeine? Very Little    Marital status/What year were you married? Married since 1961   Do you live in a house, apartment, assisted living, condo, trailer, etc.? Did not answer   Is it one or more stories? One   How many persons live in your  home? 2    Do you have any pets in your home? No   Current or past profession: Brewing technologist.    Do you exercise? Very little    Type and how often? Did not answer    Do you have a living will? Yes   Do you have a DNR form? Did not answer   Do you have signed POA/HPOA forms? Did not answer   Social Drivers of Corporate investment banker Strain: Not on file  Food Insecurity: Not on file  Transportation Needs: Not on file  Physical Activity: Not on file  Stress: Not on file  Social Connections: Not on file   Family History  Problem Relation Age of Onset  . Healthy Mother   . Healthy Father   . Other Sister   . Healthy Sister   . Thyroid  cancer Brother   . Heart disease Brother   . Diabetes type I Son   . Colon cancer Neg Hx   . Esophageal cancer Neg Hx   . Stomach cancer Neg Hx   . Rectal cancer Neg Hx    Allergies  Allergen Reactions  . Cyclobenzaprine Other (See Comments)    Lost balance  . Lisinopril  Other (See Comments)    Affected ability to urinate  . Relafen [Nabumetone] Other (See Comments)    Problems urinating afterwards   . Statins Other (See Comments)    my peeing   Current Outpatient Medications  Medication Sig Dispense Refill  . AZO-CRANBERRY PO Take 1 tablet by mouth daily.    . carvedilol  (COREG ) 6.25 MG tablet TAKE 1 TABLET BY MOUTH TWICE A DAY 60 tablet 0  . ciprofloxacin  (CIPRO ) 500 MG tablet Take 500 mg by mouth 2 (two) times daily as needed.    . Cyanocobalamin  (VITAMIN B12) 1000 MCG TBCR Take 1,000 mcg by mouth.    . diazepam  (VALIUM )  5 MG tablet TAKE 1/2 TABLET BY MOUTH 2 (TWO) TIMES DAILY. 1/2 TABLET EVERY 2-3 DAYS 30 tablet 1  . fluticasone furoate-vilanterol (BREO ELLIPTA) 200-25 MCG/ACT AEPB Inhale 1 puff into the lungs daily. With Spacer 1 each 11  . gabapentin  (NEURONTIN ) 300 MG capsule TAKE 1 CAPSULE EVERY DAY 90 capsule 3  . levothyroxine  (SYNTHROID ) 25 MCG tablet Take 1 tablet (25 mcg total) by mouth daily before breakfast. Tuesday Thursday Friday Saturday 90 tablet 6  . levothyroxine  (SYNTHROID ) 50 MCG tablet TAKE 1 TABLET (50 MCG TOTAL) BY MOUTH DAILY BEFORE BREAKFAST. MONDAY WEDNESDAY SUNDAY 30 tablet 3  . omeprazole  (PRILOSEC) 20 MG capsule TAKE 1 CAPSULE BY MOUTH EVERY DAY 90 capsule 3  . polyethylene glycol (MIRALAX  / GLYCOLAX ) packet Take 17 g by mouth daily as needed for mild constipation.    . Probiotic Product (PROBIOTIC PO) Take 1 capsule by mouth daily.    . trimethoprim  (TRIMPEX ) 100 MG tablet Take 100 mg by mouth 2 (two) times daily.     No current facility-administered medications for this visit.   No results found.  Review of Systems:   A ROS was performed including pertinent positives and negatives as documented in the HPI.  Physical Exam :   Constitutional: NAD and appears stated age Neurological: Alert and oriented Psych: Appropriate affect and cooperative There were no vitals taken for this visit.   Comprehensive Musculoskeletal Exam:    Tenderness to palpation about the left wrist.  This is predominantly over the distal radius.  Negative Phalen or Tinel's test over the carpal tunnel.  No  tenderness about the ulnar styloid.  Tenderness about the left hip with internal rotation of 10 degrees.  This is worse at 90 degrees flexion.  Imaging:   Xray (3 views left wrist, CT scan pelvis): There is a large lesion involving the distal radius which appears punched out in nature.  There is some small satellite areas of involvement as well near the DRUJ.  He has significant chondrocalcinosis about  the wrist as well.  There is evidence of moderate left hip osteoarthritis I personally reviewed and interpreted the radiographs.   Assessment:   89 y.o. male with left wrist pain consistent again with arthritis of the wrist.  He would like an additional injection provided today which I performed after verbal consent was obtained  Plan :    -Left wrist ultrasound-guided injection provided after verbal consent obtained    Procedure Note  Patient: David Gutierrez             Date of Birth: 1933-05-21           MRN: [id number]             Visit Date: 05/15/2024  Procedures: Visit Diagnoses:  1. Pain in left wrist     Medium Joint Inj: R radiocarpal on 05/15/2024 12:53 PM Details: ultrasound-guided posterior approach        I personally saw and evaluated the patient, and participated in the management and treatment plan.  Wilhelmenia Harada, MD Attending Physician, Orthopedic Surgery  This document was dictated using Dragon voice recognition software. A reasonable attempt at proof reading has been made to minimize errors.

## 2024-05-27 ENCOUNTER — Encounter: Payer: Self-pay | Admitting: Urology

## 2024-05-29 ENCOUNTER — Emergency Department (HOSPITAL_BASED_OUTPATIENT_CLINIC_OR_DEPARTMENT_OTHER)
Admission: EM | Admit: 2024-05-29 | Discharge: 2024-05-29 | Disposition: A | Discharge status: Home / Self Care | Attending: Emergency Medicine | Admitting: Emergency Medicine

## 2024-05-29 ENCOUNTER — Emergency Department (HOSPITAL_BASED_OUTPATIENT_CLINIC_OR_DEPARTMENT_OTHER): Admitting: Radiology

## 2024-05-29 ENCOUNTER — Emergency Department (HOSPITAL_BASED_OUTPATIENT_CLINIC_OR_DEPARTMENT_OTHER)

## 2024-05-29 ENCOUNTER — Other Ambulatory Visit: Payer: Self-pay

## 2024-05-29 ENCOUNTER — Other Ambulatory Visit (HOSPITAL_BASED_OUTPATIENT_CLINIC_OR_DEPARTMENT_OTHER): Payer: Self-pay

## 2024-05-29 DIAGNOSIS — Z96652 Presence of left artificial knee joint: Secondary | ICD-10-CM | POA: Diagnosis not present

## 2024-05-29 DIAGNOSIS — R600 Localized edema: Secondary | ICD-10-CM | POA: Insufficient documentation

## 2024-05-29 DIAGNOSIS — I2510 Atherosclerotic heart disease of native coronary artery without angina pectoris: Secondary | ICD-10-CM | POA: Insufficient documentation

## 2024-05-29 DIAGNOSIS — D696 Thrombocytopenia, unspecified: Secondary | ICD-10-CM | POA: Diagnosis not present

## 2024-05-29 DIAGNOSIS — I82402 Acute embolism and thrombosis of unspecified deep veins of left lower extremity: Secondary | ICD-10-CM | POA: Diagnosis not present

## 2024-05-29 DIAGNOSIS — M79605 Pain in left leg: Secondary | ICD-10-CM

## 2024-05-29 DIAGNOSIS — M7989 Other specified soft tissue disorders: Secondary | ICD-10-CM | POA: Diagnosis not present

## 2024-05-29 DIAGNOSIS — I82442 Acute embolism and thrombosis of left tibial vein: Secondary | ICD-10-CM | POA: Diagnosis not present

## 2024-05-29 DIAGNOSIS — R102 Pelvic and perineal pain: Secondary | ICD-10-CM | POA: Diagnosis not present

## 2024-05-29 DIAGNOSIS — M7732 Calcaneal spur, left foot: Secondary | ICD-10-CM | POA: Diagnosis not present

## 2024-05-29 DIAGNOSIS — M19072 Primary osteoarthritis, left ankle and foot: Secondary | ICD-10-CM | POA: Diagnosis not present

## 2024-05-29 DIAGNOSIS — M79662 Pain in left lower leg: Secondary | ICD-10-CM | POA: Diagnosis not present

## 2024-05-29 DIAGNOSIS — M79672 Pain in left foot: Secondary | ICD-10-CM | POA: Diagnosis not present

## 2024-05-29 LAB — CBC WITH DIFFERENTIAL/PLATELET
Abs Immature Granulocytes: 0.02 10*3/uL (ref 0.00–0.07)
Basophils Absolute: 0 10*3/uL (ref 0.0–0.1)
Basophils Relative: 1 %
Eosinophils Absolute: 0.1 10*3/uL (ref 0.0–0.5)
Eosinophils Relative: 2 %
HCT: 35.6 % — ABNORMAL LOW (ref 39.0–52.0)
Hemoglobin: 11.4 g/dL — ABNORMAL LOW (ref 13.0–17.0)
Immature Granulocytes: 1 %
Lymphocytes Relative: 35 %
Lymphs Abs: 1.5 10*3/uL (ref 0.7–4.0)
MCH: 30.2 pg (ref 26.0–34.0)
MCHC: 32 g/dL (ref 30.0–36.0)
MCV: 94.2 fL (ref 80.0–100.0)
Monocytes Absolute: 0.5 10*3/uL (ref 0.1–1.0)
Monocytes Relative: 11 %
Neutro Abs: 2.3 10*3/uL (ref 1.7–7.7)
Neutrophils Relative %: 50 %
Platelets: 117 10*3/uL — ABNORMAL LOW (ref 150–400)
RBC: 3.78 MIL/uL — ABNORMAL LOW (ref 4.22–5.81)
RDW: 18.8 % — ABNORMAL HIGH (ref 11.5–15.5)
WBC: 4.4 10*3/uL (ref 4.0–10.5)
nRBC: 0 % (ref 0.0–0.2)

## 2024-05-29 LAB — COMPREHENSIVE METABOLIC PANEL WITH GFR
ALT: 9 U/L (ref 0–44)
AST: 15 U/L (ref 15–41)
Albumin: 3.6 g/dL (ref 3.5–5.0)
Alkaline Phosphatase: 79 U/L (ref 38–126)
Anion gap: 8 (ref 5–15)
BUN: 9 mg/dL (ref 8–23)
CO2: 26 mmol/L (ref 22–32)
Calcium: 9.2 mg/dL (ref 8.9–10.3)
Chloride: 106 mmol/L (ref 98–111)
Creatinine, Ser: 0.62 mg/dL (ref 0.61–1.24)
GFR, Estimated: >60 mL/min (ref >60)
Glucose, Bld: 92 mg/dL (ref 70–99)
Potassium: 4.3 mmol/L (ref 3.5–5.1)
Sodium: 140 mmol/L (ref 135–145)
Total Bilirubin: 0.6 mg/dL (ref 0.0–1.2)
Total Protein: 6.1 g/dL — ABNORMAL LOW (ref 6.5–8.1)

## 2024-05-29 LAB — URINALYSIS, W/ REFLEX TO CULTURE (INFECTION SUSPECTED)
Bacteria, UA: NONE SEEN
Bilirubin Urine: NEGATIVE
Glucose, UA: NEGATIVE mg/dL
Hgb urine dipstick: NEGATIVE
Ketones, ur: NEGATIVE mg/dL
Leukocytes,Ua: NEGATIVE
Nitrite: NEGATIVE
Protein, ur: NEGATIVE mg/dL
Specific Gravity, Urine: 1.006 (ref 1.005–1.030)
pH: 5.5 (ref 5.0–8.0)

## 2024-05-29 LAB — PRO BRAIN NATRIURETIC PEPTIDE: Pro Brain Natriuretic Peptide: 1257 pg/mL — ABNORMAL HIGH (ref <300.0)

## 2024-05-29 LAB — LACTIC ACID, PLASMA
Lactic Acid, Venous: 0.8 mmol/L (ref 0.5–1.9)
Lactic Acid, Venous: 1.4 mmol/L (ref 0.5–1.9)

## 2024-05-29 MED ORDER — APIXABAN 2.5 MG PO TABS: 5.0000 mg | ORAL_TABLET | Freq: Two times a day (BID) | ORAL | Status: DC

## 2024-05-29 MED ORDER — APIXABAN 2.5 MG PO TABS: 10.0000 mg | ORAL_TABLET | Freq: Two times a day (BID) | ORAL | Status: DC

## 2024-05-29 MED ORDER — APIXABAN (ELIQUIS) VTE STARTER PACK (10MG AND 5MG)
ORAL_TABLET | ORAL | 0 refills | Status: DC
  Filled 2024-05-29: qty 74, 28d supply, fill #0

## 2024-05-29 NOTE — ED Triage Notes (Signed)
 From [facility]. C/o left foot/ankle swelling. Denies injury.

## 2024-05-29 NOTE — Discharge Instructions (Signed)
 Your history, exam, workup today revealed you do have a deep vein thrombosis or DVT in your left lower leg causing the pain and swelling.  I spoke to both the pharmacy and the vascular surgery team with Dr. Serene who reviewed the case and they agree with anticoagulation with Eliquis and close follow-up in the DVT clinic.  They should call you for follow-up and arrange that in the next week or 2.  Please take the anticoagulant to prevent further blood clots.  The rest of your workup was similar to prior.  Please rest and stay hydrated.  If any symptoms change or worsen acutely, please return to the nearest Emergency Department.

## 2024-05-29 NOTE — ED Provider Notes (Signed)
 [place] EMERGENCY DEPARTMENT AT [facility] Provider Note   CSN: [id number] Arrival date & time: 05/29/24  1036     Patient presents with: Leg Swelling   David Gutierrez is a 89 y.o. male.   The history is provided by the patient and medical records. No language interpreter was used.  Leg Pain Location:  Leg Time since incident:  3 days Injury: no   Leg location:  L lower leg Pain details:    Quality:  Aching and dull   Radiates to:  Does not radiate   Severity:  Moderate   Onset quality:  Gradual   Duration:  3 days   Timing:  Constant   Progression:  Unchanged Chronicity:  New Foreign body present:  No foreign bodies Tetanus status:  Unknown Prior injury to area:  No Relieved by:  Nothing Worsened by:  Bearing weight Ineffective treatments:  None tried Associated symptoms: swelling   Associated symptoms: no back pain, no fatigue, no fever, no neck pain, no numbness and no stiffness        Prior to Admission medications   Medication Sig Start Date End Date Taking? Authorizing Provider  AZO-CRANBERRY PO Take 1 tablet by mouth daily.    [provider]  carvedilol  (COREG ) 6.25 MG tablet TAKE 1 TABLET BY MOUTH TWICE A DAY 05/08/24   Okey Vina GAILS, MD  ciprofloxacin  (CIPRO ) 500 MG tablet Take 500 mg by mouth 2 (two) times daily as needed. 12/30/23   [provider]  Cyanocobalamin  (VITAMIN B12) 1000 MCG TBCR Take 1,000 mcg by mouth.    [provider]  diazepam  (VALIUM ) 5 MG tablet TAKE 1/2 TABLET BY MOUTH 2 (TWO) TIMES DAILY. 1/2 TABLET EVERY 2-3 DAYS 04/01/24   Charlanne Fredia CROME, MD  fluticasone  furoate-vilanterol (BREO ELLIPTA ) 200-25 MCG/ACT AEPB Inhale 1 puff into the lungs daily. With Spacer 05/14/24   Charlanne Fredia CROME, MD  gabapentin  (NEURONTIN ) 300 MG capsule TAKE 1 CAPSULE EVERY DAY 09/04/23   Patel, Donika K, DO  levothyroxine  (SYNTHROID ) 25 MCG tablet Take 1 tablet (25 mcg total) by mouth daily before breakfast. Tuesday Thursday  Friday Saturday 12/05/23   Charlanne Fredia CROME, MD  levothyroxine  (SYNTHROID ) 50 MCG tablet TAKE 1 TABLET (50 MCG TOTAL) BY MOUTH DAILY BEFORE BREAKFAST. MONDAY [facility] SUNDAY 10/11/23   Charlanne Fredia CROME, MD  omeprazole  (PRILOSEC) 20 MG capsule TAKE 1 CAPSULE BY MOUTH EVERY DAY 03/19/24   Abran Norleen SAILOR, MD  polyethylene glycol (MIRALAX  / GLYCOLAX ) packet Take 17 g by mouth daily as needed for mild constipation.    [provider]  Probiotic Product (PROBIOTIC PO) Take 1 capsule by mouth daily.    [provider]  trimethoprim  (TRIMPEX ) 100 MG tablet Take 100 mg by mouth 2 (two) times daily. 09/17/18   [provider]    Allergies: Cyclobenzaprine, Lisinopril , Relafen [nabumetone], and Statins    Review of Systems  Constitutional:  Negative for chills, fatigue and fever.  HENT:  Negative for congestion.   Respiratory:  Negative for chest tightness, shortness of breath and wheezing.   Cardiovascular:  Positive for leg swelling. Negative for chest pain and palpitations.  Gastrointestinal:  Negative for abdominal pain, constipation, diarrhea, nausea and vomiting.  Genitourinary:  Negative for dysuria and flank pain.       More urine than normal  Musculoskeletal:  Negative for back pain, neck pain, neck stiffness and stiffness.  Skin:  Positive for color change. Negative for rash and wound.  Neurological:  Negative for headaches.  Psychiatric/Behavioral:  Negative for agitation, behavioral problems and confusion.   All other systems reviewed and are negative.   Updated Vital Signs BP 114/72 (BP Location: Left Arm)   Pulse 82   Temp 98.2 F (36.8 C) (Oral)   Resp 18   SpO2 98%   Physical Exam Constitutional:      General: He is not in acute distress.    Appearance: He is well-developed. He is not ill-appearing, toxic-appearing or diaphoretic.  HENT:     Head: Normocephalic and atraumatic.     Right Ear: External ear normal.     Left Ear: External ear normal.      Nose: Nose normal.     Mouth/Throat:     Mouth: Mucous membranes are moist.     Pharynx: No oropharyngeal exudate or posterior oropharyngeal erythema.   Eyes:     Extraocular Movements: Extraocular movements intact.     Conjunctiva/sclera: Conjunctivae normal.     Pupils: Pupils are equal, round, and reactive to light.    Cardiovascular:     Rate and Rhythm: Normal rate.     Pulses: Normal pulses.  Pulmonary:     Effort: No respiratory distress.     Breath sounds: No stridor. No wheezing, rhonchi or rales.  Chest:     Chest wall: No tenderness.  Abdominal:     Palpations: Abdomen is soft.     Tenderness: There is no abdominal tenderness. There is no guarding or rebound.   Musculoskeletal:        General: Swelling and tenderness present.     Cervical back: Normal range of motion and neck supple.   Skin:    General: Skin is warm.     Findings: Erythema present. No rash.   Neurological:     Mental Status: He is alert and oriented to person, place, and time.     Sensory: No sensory deficit.     Motor: No weakness or abnormal muscle tone.   Psychiatric:        Mood and Affect: Mood normal.     (all labs ordered are listed, but only abnormal results are displayed) Labs Reviewed  CBC WITH DIFFERENTIAL/PLATELET - Abnormal; Notable for the following components:      Result Value   RBC 3.78 (*)    Hemoglobin 11.4 (*)    HCT 35.6 (*)    RDW 18.8 (*)    Platelets 117 (*)    All other components within normal limits  COMPREHENSIVE METABOLIC PANEL WITH GFR - Abnormal; Notable for the following components:   Total Protein 6.1 (*)    All other components within normal limits  PRO BRAIN NATRIURETIC PEPTIDE - Abnormal; Notable for the following components:   Pro Brain Natriuretic Peptide 1,257.0 (*)    All other components within normal limits  URINE CULTURE  LACTIC ACID, PLASMA  LACTIC ACID, PLASMA  URINALYSIS, W/ REFLEX TO CULTURE (INFECTION SUSPECTED)     EKG: None  Radiology: US  Venous Img Lower Unilateral Left Result Date: 05/29/2024 CLINICAL DATA:  Pain and edema EXAM: Left LOWER EXTREMITY VENOUS DOPPLER ULTRASOUND TECHNIQUE: Gray-scale sonography with graded compression, as well as color Doppler and duplex ultrasound were performed to evaluate the lower extremity deep venous systems from the level of the common femoral vein and including the common femoral, femoral, profunda femoral, popliteal and calf veins including the posterior tibial, peroneal and gastrocnemius veins when visible. The superficial great saphenous vein was also interrogated.  Spectral Doppler was utilized to evaluate flow at rest and with distal augmentation maneuvers in the common femoral, femoral and popliteal veins. COMPARISON:  None Available. FINDINGS: Contralateral Common Femoral Vein: Respiratory phasicity is normal and symmetric with the symptomatic side. No evidence of thrombus. Normal compressibility. Common Femoral Vein: No evidence of thrombus. Normal compressibility, respiratory phasicity and response to augmentation. Saphenofemoral Junction: No evidence of thrombus. Normal compressibility and flow on color Doppler imaging. Profunda Femoral Vein: No evidence of thrombus. Normal compressibility and flow on color Doppler imaging. Femoral Vein: No evidence of thrombus. Normal compressibility, respiratory phasicity and response to augmentation. Popliteal Vein: No evidence of thrombus. Normal compressibility, respiratory phasicity and response to augmentation. Calf Veins: There is poor flow and compression of the posterior tibial vein consistent with below-the-knee thrombus. Superficial Great Saphenous Vein: No evidence of thrombus. Normal compressibility. Venous Reflux:  None. Other Findings:  None. IMPRESSION: Below-the-knee thrombus in the posterior tibial vein. No above the knee DVT. Electronically Signed   By: Ranell Bring M.D.   On: 05/29/2024 13:55   DG Tibia/Fibula  Left Result Date: 05/29/2024 CLINICAL DATA:  Edema and pain. EXAM: LEFT TIBIA AND FIBULA - 2 VIEW COMPARISON:  None Available. FINDINGS: Osteopenia. Mild diffuse soft tissue edema. Left total knee arthroplasty. No soft tissue gas. IMPRESSION: Mild diffuse soft tissue edema without soft tissue gas. Electronically Signed   By: Newell Eke M.D.   On: 05/29/2024 13:20   DG Foot 2 Views Left Result Date: 05/29/2024 CLINICAL DATA:  Edema and pain. EXAM: LEFT FOOT - 2 VIEW COMPARISON:  None Available. FINDINGS: Osteopenia. Diffuse soft tissue swelling. No soft tissue gas. Degenerative changes in the midfoot. Calcaneal spur. IMPRESSION: 1. Diffuse soft tissue swelling.  No soft tissue gas. 2. Midfoot osteoarthritis. Electronically Signed   By: Newell Eke M.D.   On: 05/29/2024 13:20     Procedures   Medications Ordered in the ED - No data to display                                  Medical Decision Making Amount and/or Complexity of Data Reviewed Labs: ordered. Radiology: ordered.  Risk Prescription drug management.    David Gutierrez is a 89 y.o. male with a past medical history significant for CAD, hiatal hernia, nonischemic cardiomyopathy, BPH, and chronic self bladder-catheterization who presents with left leg swelling and redness.  Cording to patient, for last few days he has had redness of feeling in his lower left leg from the knee down.  He said his right legs had cellulitis before but this feels slightly different.  He denies history of blood clots.  Denies recent travel or traumas.  Reports no fevers, chills, congestion, cough, chest pain, shortness of breath, nausea, vomiting, constipation, or diarrhea.  He reports he self caths and has had some more urine than normal but no dysuria.  Denies other complaints on arrival.  On exam, patient has redness on his left lower leg but has intact pulses.  Intact sensation but he reports some chronic neuropathies that are unchanged.  Good  strength.  Knee nontender.  Hips nontender.  Rest of exam reassuring.  No large skin injury seen to suggest cellulitis source.  Given the unilateral nature we agreed to get ultrasound, some x-rays to look for subcutaneous gas or bony involvement and will get labs.  Ultrasound shows he does have DVT in the posterior tibial vein  on the left leg.  The x-ray showed soft tissue swelling but otherwise no gas or bony changes.  Edema is seen.  Labs similar to prior with some chronic thrombocytopenia.  I had a conversation with Dr. Serene with vascular surgery who recommended anticoagulation and follow-up in the DVT clinic.  Amatory referral was placed at their direction and they should call to schedule this.  I discussed with pharmacy the appropriate dosing and confirmation of the correct prescription given the patient's thrombocytopenia and they do feel the Eliquis is appropriate for him at the normal dosing.  I spoke to the pharmacist here at [facility] she will bring him the starter pack and get him started on medications.  Patient does have a history of GI bleeding but this was after a colonoscopy that was provoked and has not had bleeding since.  He agrees with plan for using blood thinner.  He will follow-up with DVT clinic and PCP and had no other questions or concerns.  Patient discharged in good condition but otherwise.       Final diagnoses:  Deep vein thrombosis (DVT) of left posterior tibial vein (HCC)  Pain of left lower extremity    ED Discharge Orders          Ordered    APIXABAN (ELIQUIS) VTE STARTER PACK (10MG  AND 5MG )       Note to Pharmacy: If starter pack unavailable, substitute with seventy-four 5 mg apixaban tabs following the above SIG directions.   05/29/24 1516    AMB Referral to Deep Vein Thrombosis Clinic        05/29/24 1526           Clinical Impression: 1. Deep vein thrombosis (DVT) of left posterior tibial vein (HCC)   2. Pain of left lower extremity      Disposition: Discharge  Condition: Good  I have discussed the results, Dx and Tx plan with the pt(& family if present). He/she/they expressed understanding and agree(s) with the plan. Discharge instructions discussed at great length. Strict return precautions discussed and pt &/or family have verbalized understanding of the instructions. No further questions at time of discharge.    New Prescriptions   APIXABAN (ELIQUIS) VTE STARTER PACK (10MG  AND 5MG )    Take as directed on package: start with two-5mg  tablets twice daily for 7 days. On day 8, switch to one-5mg  tablet twice daily.    Follow Up: Charlanne Fredia CROME, MD [address] [place] [address] [phone]         Windy Dudek, Lonni PARAS, MD 05/29/24 [phone]

## 2024-05-30 LAB — URINE CULTURE: Culture: NO GROWTH

## 2024-06-02 ENCOUNTER — Other Ambulatory Visit: Payer: Self-pay | Admitting: Internal Medicine

## 2024-06-02 DIAGNOSIS — I2510 Atherosclerotic heart disease of native coronary artery without angina pectoris: Secondary | ICD-10-CM

## 2024-06-02 DIAGNOSIS — I428 Other cardiomyopathies: Secondary | ICD-10-CM

## 2024-06-03 ENCOUNTER — Encounter: Admitting: Adult Health

## 2024-06-03 NOTE — Progress Notes (Deleted)
 DVT Clinic Note  Name: David Gutierrez     MRN: [id number]     DOB: 08-27-1933     Sex: male  PCP: Charlanne Fredia CROME, MD  Today's Visit:    Referred to DVT Clinic by: Emergency Department - 2035-11-24 Referred to CPP by: {CPP Referral Provider:[id number]} Reason for referral: No chief complaint on file.  HISTORY OF PRESENT ILLNESS: David Gutierrez is a 89 y.o. male with PMH HLD, CAD, nonischemic cardiomyopathy, hypothyroidism, thrombocytopenia, BPH, chronic self bladder catheterization, who presents for follow up medication management after diagnosis of DVT. Patient presented to the ED 05/29/24 with left lower leg pain and swelling for the prior three days. Ultrasound showed *** in the left posterior tibial vein. ED provider discussed with Dr. Serene who recommended anticoagulation and DVT Clinic follow up.   Today, patient reports ***. *** abnormal bleeding or bruising. *** missed doses of ***. *** wearing compression stockings. No prior history of blood clots.           Rx Insurance Coverage: {Insurance Ubez:[id number]} Rx Affordability: *** Rx Assistance Provided: {Rx Assistance Provided:[id number]} Preferred Pharmacy: ***  Past Medical History:  Diagnosis Date   Alcoholic peripheral neuropathy [facility])    neurologist-  dr patel--- feet numbness and sensory ataxia   Arthritis    B12 deficiency    BPH (benign prostatic hyperplasia)    CAD (coronary artery disease) cardiolgoist-  dr lonni end (previouly dr dalton rolan)   a. Myoview 11/13:  EF 38%, inf and IS defect c/w scar but no ischemia:  b. Cardiac CT 11/13:  Ca score 318 Agatson units, pLAD and dCFX plaque;   c. LHC 11/07/12:  pLAD 30%, oD1 40%, oCFX 30%, dCFX 40-50%, mRCA 40%, EF 50% (frequent PVCs and short run of NSVT with injection/    per last echo 01/ 2017  ef 55-60%   Chronic constipation    Diverticulosis of colon    Esophageal reflux    External hemorrhoids    First degree heart block    Hiatal hernia    History of adenomatous  polyp of colon    History of esophageal stricture 08/11/2015   s/p  dilatation   History of lower GI bleeding 03/01/2017   s/p  flexiable simoidscopy w/ clipping rectum ulcer   Hypogonadism male    Hypothyroidism    Interstitial cystitis    Irritable bowel syndrome    LBBB (left bundle branch block)    NICM (nonischemic cardiomyopathy) [facility]) cardiologist-  dr lonni end (previously dr dalton rolan)--  per last echo 01/ 2017  ef 55-60%   ? 2/2 LBBB => a. echo 11/13: diff HK, worse in septum and apex, mod LVE, mild LVH, EF 40%, mild AI, mild MR, mod LAE   Pre-diabetes    Self-catheterizes urinary bladder    bid to tid    Wears glasses    Wears hearing aid in both ears     Past Surgical History:  Procedure Laterality Date   CARDIAC CATHETERIZATION  11-07-2012    dr rolan   nonobstructive CAD (pLAD 30%, ostial D1 40%, ostial LCx 30%, dLCx diffuse 40-50%, mRCA 40%) ;  LVSF 50% but diffiult due to PVCs and short run VT with injection;  cardiomyopathy mostly likely a LBBB cardiomyopathy   CARDIOVASCULAR STRESS TEST  10-16-2012   dr rolan   Low risk adenosine  nuclear study (no exercise) w/ a fixed inferior and inferoseptal defect without ischemia (question as whether this abnormality due to LBBB cardiomyopathy  or due to scar)/  LV ef 38%,  LV wall motion decreased of the septum and entire apex   CATARACT EXTRACTION W/ INTRAOCULAR LENS  IMPLANT, BILATERAL  2012  approx.   COLONOSCOPY  2016   Dr. Abran, Per new patient form   CYSTO/  HYDRODISTENTION/  BLADDER BIOPSY  04-20-2005    dr renay moats  [facility]   ETHMOIDECTOMY Right 12/12/2016   Procedure: RIGHT ENDOSCOPIC ETHMOIDECTOMY;  Surgeon: Daniel Moccasin, MD;  Location: [facility];  Service: ENT;  Laterality: Right;   FLEXIBLE SIGMOIDOSCOPY N/A 03/02/2017   Procedure: FLEXIBLE SIGMOIDOSCOPY;  Surgeon: Gwendlyn ONEIDA Buddy, MD;  Location: [facility];  Service: Endoscopy;  Laterality: N/A;   LUMBAR LAMINECTOMY  1952   MAXILLARY  ANTROSTOMY Right 12/12/2016   Procedure: RIGHT ENDOSCOPIC MAXILLARY ANTROSTOMY;  Surgeon: Daniel Moccasin, MD;  Location: [facility];  Service: ENT;  Laterality: Right;   SINUS ENDO W/FUSION Right 12/12/2016   Procedure: ENDOSCOPIC SINUS SURGERY WITH NAVIGATION;  Surgeon: Daniel Moccasin, MD;  Location: [facility];  Service: ENT;  Laterality: Right;   SPHENOIDECTOMY Right 12/12/2016   Procedure: RIGHT ENDOSCOPIC SPHENOIDECTOMY;  Surgeon: Daniel Moccasin, MD;  Location: [facility];  Service: ENT;  Laterality: Right;   TONSILLECTOMY AND ADENOIDECTOMY  child   TOTAL KNEE ARTHROPLASTY Left 10/14/2014   Procedure: LEFT TOTAL KNEE ARTHROPLASTY;  Surgeon: Donnice JONETTA Car, MD;  Location: [facility];  Service: Orthopedics;  Laterality: Left;   TOTAL KNEE ARTHROPLASTY Right 1990s   TRANSTHORACIC ECHOCARDIOGRAM  12-24-2015   dr rolan   ef 55-60%,  grade 1 diastolic dysfunction/  trivial AR and TR  mild MR and PR/  moderate LAE   TRANSURETHRAL RESECTION OF PROSTATE  06-11-2010   dr chales  [facility]   w/  GYRUS   TRANSURETHRAL RESECTION OF PROSTATE N/A 12/06/2017   Procedure: TRANSURETHRAL RESECTION OF THE PROSTATE (TURP)/ BIPOLAR;  Surgeon: Devere Lonni Righter, MD;  Location: [facility];  Service: Urology;  Laterality: N/A;   TRANSURETHRAL RESECTION OF PROSTATE N/A 04/11/2018   Procedure: TRANSURETHRAL RESECTION OF THE PROSTATE (TURP);  Surgeon: Devere Lonni Righter, MD;  Location: [facility];  Service: Urology;  Laterality: N/A;   UPPER GASTROINTESTINAL ENDOSCOPY      Social History   Socioeconomic History   Marital status: Married    Spouse name: Not on file   Number of children: 3   Years of education: Not on file   Highest education level: Not on file  Occupational History   Occupation: retired  Tobacco Use   Smoking status: Former    Current packs/day: 0.00    Average packs/day: 2.0 packs/day for 15.0 years (30.0 ttl pk-yrs)    Types: Cigarettes     Start date: 11/29/1952    Quit date: 11/30/1967    Years since quitting: 56.5   Smokeless tobacco: Never  Vaping Use   Vaping status: Never Used  Substance and Sexual Activity   Alcohol  use: Yes    Alcohol /week: 2.0 standard drinks of alcohol     Types: 2 Standard drinks or equivalent per week    Comment: occasional   Drug use: No   Sexual activity: Yes  Other Topics Concern   Not on file  Social History Narrative   Do you drink/eat things with caffeine? Very Little    Marital status/What year were you married? Married since 1961   Do you live in a house, apartment, assisted living, condo, trailer, etc.? Did not  answer   Is it one or more stories? One   How many persons live in your home? 2    Do you have any pets in your home? No   Current or past profession: Brewing technologist.    Do you exercise? Very little    Type and how often? Did not answer    Do you have a living will? Yes   Do you have a DNR form? Did not answer   Do you have signed POA/HPOA forms? Did not answer   Social Drivers of Health   Financial Resource Strain: Not on file  Food Insecurity: Not on file  Transportation Needs: Not on file  Physical Activity: Not on file  Stress: Not on file  Social Connections: Not on file  Intimate Partner Violence: Not on file    Family History  Problem Relation Age of Onset   Healthy Mother    Healthy Father    Other Sister    Healthy Sister    Thyroid  cancer Brother    Heart disease Brother    Diabetes type I Son    Colon cancer Neg Hx    Esophageal cancer Neg Hx    Stomach cancer Neg Hx    Rectal cancer Neg Hx     Allergies as of 06/04/2024 - Review Complete 05/29/2024  Allergen Reaction Noted   Cyclobenzaprine Other (See Comments) 10/14/2014   Lisinopril  Other (See Comments) 10/14/2014   Relafen [nabumetone] Other (See Comments) 10/14/2014   Statins Other (See Comments) 02/15/2017    Current Outpatient Medications on File Prior to Visit  Medication Sig  Dispense Refill   APIXABAN  (ELIQUIS ) VTE STARTER PACK (10MG  AND 5MG ) Take as directed on package: start with two-5mg  tablets twice daily for 7 days. On day 8, switch to one-5mg  tablet twice daily. 74 each 0   AZO-CRANBERRY PO Take 1 tablet by mouth daily.     carvedilol  (COREG ) 6.25 MG tablet TAKE 1 TABLET BY MOUTH TWICE A DAY 60 tablet 0   ciprofloxacin  (CIPRO ) 500 MG tablet Take 500 mg by mouth 2 (two) times daily as needed.     Cyanocobalamin  (VITAMIN B12) 1000 MCG TBCR Take 1,000 mcg by mouth.     diazepam  (VALIUM ) 5 MG tablet TAKE 1/2 TABLET BY MOUTH 2 (TWO) TIMES DAILY. 1/2 TABLET EVERY 2-3 DAYS 30 tablet 1   fluticasone  furoate-vilanterol (BREO ELLIPTA ) 200-25 MCG/ACT AEPB Inhale 1 puff into the lungs daily. With Spacer 1 each 11   gabapentin  (NEURONTIN ) 300 MG capsule TAKE 1 CAPSULE EVERY DAY 90 capsule 3   levothyroxine  (SYNTHROID ) 25 MCG tablet Take 1 tablet (25 mcg total) by mouth daily before breakfast. Tuesday Thursday Friday Saturday 90 tablet 6   levothyroxine  (SYNTHROID ) 50 MCG tablet TAKE 1 TABLET (50 MCG TOTAL) BY MOUTH DAILY BEFORE BREAKFAST. MONDAY WEDNESDAY SUNDAY 30 tablet 3   omeprazole  (PRILOSEC) 20 MG capsule TAKE 1 CAPSULE BY MOUTH EVERY DAY 90 capsule 3   polyethylene glycol (MIRALAX  / GLYCOLAX ) packet Take 17 g by mouth daily as needed for mild constipation.     Probiotic Product (PROBIOTIC PO) Take 1 capsule by mouth daily.     trimethoprim  (TRIMPEX ) 100 MG tablet Take 100 mg by mouth 2 (two) times daily.     No current facility-administered medications on file prior to visit.   REVIEW OF SYSTEMS:  ROS PHYSICAL EXAMINATION:  There were no vitals filed for this visit.  There is no height or weight on file to calculate BMI.  Physical Exam Villalta Score for Post-Thrombotic Syndrome:    LABS:  CBC     Component Value Date/Time   WBC 4.4 05/29/2024 1255   RBC 3.78 (L) 05/29/2024 1255   HGB 11.4 (L) 05/29/2024 1255   HGB 12.1 (L) 03/05/2024 1130   HCT 35.6  (L) 05/29/2024 1255   PLT 117 (L) 05/29/2024 1255   PLT 90 (L) 03/05/2024 1130   MCV 94.2 05/29/2024 1255   MCH 30.2 05/29/2024 1255   MCHC 32.0 05/29/2024 1255   RDW 18.8 (H) 05/29/2024 1255   LYMPHSABS 1.5 05/29/2024 1255   MONOABS 0.5 05/29/2024 1255   EOSABS 0.1 05/29/2024 1255   BASOSABS 0.0 05/29/2024 1255    Hepatic Function      Component Value Date/Time   PROT 6.1 (L) 05/29/2024 1255   ALBUMIN 3.6 05/29/2024 1255   AST 15 05/29/2024 1255   AST 14 (L) 07/06/2023 1123   ALT 9 05/29/2024 1255   ALT 8 07/06/2023 1123   ALKPHOS 79 05/29/2024 1255   BILITOT 0.6 05/29/2024 1255   BILITOT 0.9 07/06/2023 1123   BILIDIR 0.2 03/24/2016 0735   IBILI 0.5 03/24/2016 0735    Renal Function   Lab Results  Component Value Date   CREATININE 0.62 05/29/2024   CREATININE 0.70 02/08/2024   CREATININE 0.62 07/06/2023    CrCl cannot be calculated (Unknown ideal weight.).   [facility] Vascular Lab Studies:  05/29/24 FINDINGS: Contralateral Common Femoral Vein: Respiratory phasicity is normal and symmetric with the symptomatic side. No evidence of thrombus. Normal compressibility.   Common Femoral Vein: No evidence of thrombus. Normal compressibility, respiratory phasicity and response to augmentation.   Saphenofemoral Junction: No evidence of thrombus. Normal compressibility and flow on color Doppler imaging.   Profunda Femoral Vein: No evidence of thrombus. Normal compressibility and flow on color Doppler imaging.   Femoral Vein: No evidence of thrombus. Normal compressibility, respiratory phasicity and response to augmentation.   Popliteal Vein: No evidence of thrombus. Normal compressibility, respiratory phasicity and response to augmentation.   Calf Veins: There is poor flow and compression of the posterior tibial vein consistent with below-the-knee thrombus.   Superficial Great Saphenous Vein: No evidence of thrombus. Normal compressibility.   Venous Reflux:  None.    Other Findings:  None.   IMPRESSION: Below-the-knee thrombus in the posterior tibial vein. No above the knee DVT.  ASSESSMENT:    Patient without prior history of DVT diagnosed with DVT in the left posterior tibial vein.   PLAN: {DVT Clinic Eojw:71604}  Follow up: PCP 06/11/24. ***  Lum Herald, PharmD, JAQUELINE, CPP Deep Vein Thrombosis Clinic Clinical Pharmacist Practitioner [phone]

## 2024-06-04 ENCOUNTER — Ambulatory Visit (HOSPITAL_BASED_OUTPATIENT_CLINIC_OR_DEPARTMENT_OTHER)

## 2024-06-04 ENCOUNTER — Ambulatory Visit: Attending: Student-PharmD | Admitting: Student-PharmD

## 2024-06-04 ENCOUNTER — Ambulatory Visit (HOSPITAL_BASED_OUTPATIENT_CLINIC_OR_DEPARTMENT_OTHER): Admitting: Student

## 2024-06-04 DIAGNOSIS — M79641 Pain in right hand: Secondary | ICD-10-CM | POA: Diagnosis not present

## 2024-06-04 DIAGNOSIS — N312 Flaccid neuropathic bladder, not elsewhere classified: Secondary | ICD-10-CM | POA: Diagnosis not present

## 2024-06-04 DIAGNOSIS — N139 Obstructive and reflux uropathy, unspecified: Secondary | ICD-10-CM | POA: Diagnosis not present

## 2024-06-04 DIAGNOSIS — M25431 Effusion, right wrist: Secondary | ICD-10-CM

## 2024-06-04 DIAGNOSIS — M25521 Pain in right elbow: Secondary | ICD-10-CM

## 2024-06-04 DIAGNOSIS — M25522 Pain in left elbow: Secondary | ICD-10-CM | POA: Diagnosis not present

## 2024-06-04 DIAGNOSIS — M25531 Pain in right wrist: Secondary | ICD-10-CM

## 2024-06-04 DIAGNOSIS — M19041 Primary osteoarthritis, right hand: Secondary | ICD-10-CM | POA: Diagnosis not present

## 2024-06-04 DIAGNOSIS — R3914 Feeling of incomplete bladder emptying: Secondary | ICD-10-CM | POA: Diagnosis not present

## 2024-06-04 NOTE — Progress Notes (Signed)
 Chief Complaint: Right wrist and elbow pain     History of Present Illness:    David Gutierrez is a 89 y.o. male who presents today for evaluation of pain and swelling in the right wrist and hand.  He is also experiencing pain up the arm just past the elbow.  Patient was seen in the ED last week for lower left leg pain and was diagnosed with a DVT.  He was placed on Eliquis  and states that he has a follow-up with vascular later today.  Does not recall the exact onset of his right arm pain, but thinks that it may have been around the same time as his leg pain.  He is experiencing significant swelling at the wrist and into the hand/fingers, with difficulty using his right hand for many activities which is difficult given that he is right-hand dominant.  Does have pain in the elbow particular with range of motion and when trying to lift anything.  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  Has responded well to multiple prior left wrist cortisone injections.  Denies any numbness or tingling.   Surgical History:   None of right upper extremity  PMH/PSH/Family History/Social History/Meds/Allergies:    Past Medical History:  Diagnosis Date   Alcoholic peripheral neuropathy [facility])    neurologist-  dr patel--- feet numbness and sensory ataxia   Arthritis    B12 deficiency    BPH (benign prostatic hyperplasia)    CAD (coronary artery disease) cardiolgoist-  dr lonni end (previouly dr dalton rolan)   a. Myoview 11/13:  EF 38%, inf and IS defect c/w scar but no ischemia:  b. Cardiac CT 11/13:  Ca score 318 Agatson units, pLAD and dCFX plaque;   c. LHC 11/07/12:  pLAD 30%, oD1 40%, oCFX 30%, dCFX 40-50%, mRCA 40%, EF 50% (frequent PVCs and short run of NSVT with injection/    per last echo 01/ 2017  ef 55-60%   Chronic constipation    Diverticulosis of colon    Esophageal reflux    External hemorrhoids     First degree heart block    Hiatal hernia    History of adenomatous polyp of colon    History of esophageal stricture 08/11/2015   s/p  dilatation   History of lower GI bleeding 03/01/2017   s/p  flexiable simoidscopy w/ clipping rectum ulcer   Hypogonadism male    Hypothyroidism    Interstitial cystitis    Irritable bowel syndrome    LBBB (left bundle branch block)    NICM (nonischemic cardiomyopathy) [facility]) cardiologist-  dr lonni end (previously dr dalton rolan)--  per last echo 01/ 2017  ef 55-60%   ? 2/2 LBBB => a. echo 11/13: diff HK, worse in septum and apex, mod LVE, mild LVH, EF 40%, mild AI, mild MR, mod LAE   Pre-diabetes    Self-catheterizes urinary bladder    bid to tid    Wears glasses    Wears hearing aid in both ears    Past Surgical History:  Procedure Laterality Date   CARDIAC CATHETERIZATION  11-07-2012    dr rolan   nonobstructive CAD (pLAD 30%, ostial D1 40%, ostial LCx 30%, dLCx diffuse 40-50%, mRCA 40%) ;  LVSF 50% but diffiult  due to PVCs and short run VT with injection;  cardiomyopathy mostly likely a LBBB cardiomyopathy   CARDIOVASCULAR STRESS TEST  10-16-2012   dr rolan   Low risk adenosine  nuclear study (no exercise) w/ a fixed inferior and inferoseptal defect without ischemia (question as whether this abnormality due to LBBB cardiomyopathy or due to scar)/  LV ef 38%,  LV wall motion decreased of the septum and entire apex   CATARACT EXTRACTION W/ INTRAOCULAR LENS  IMPLANT, BILATERAL  2012  approx.   COLONOSCOPY  2016   Dr. Abran, Per new patient form   CYSTO/  HYDRODISTENTION/  BLADDER BIOPSY  04-20-2005    dr renay moats  [facility]   ETHMOIDECTOMY Right 12/12/2016   Procedure: RIGHT ENDOSCOPIC ETHMOIDECTOMY;  Surgeon: Daniel Moccasin, MD;  Location: [facility];  Service: ENT;  Laterality: Right;   FLEXIBLE SIGMOIDOSCOPY N/A 03/02/2017   Procedure: FLEXIBLE SIGMOIDOSCOPY;  Surgeon: Gwendlyn ONEIDA Buddy, MD;  [facility];  Service:  Endoscopy;  Laterality: N/A;   LUMBAR LAMINECTOMY  1952   MAXILLARY ANTROSTOMY Right 12/12/2016   Procedure: RIGHT ENDOSCOPIC MAXILLARY ANTROSTOMY;  Surgeon: Daniel Moccasin, MD;  Location: [facility];  Service: ENT;  Laterality: Right;   SINUS ENDO W/FUSION Right 12/12/2016   Procedure: ENDOSCOPIC SINUS SURGERY WITH NAVIGATION;  Surgeon: Daniel Moccasin, MD;  Location: [facility];  Service: ENT;  Laterality: Right;   SPHENOIDECTOMY Right 12/12/2016   Procedure: RIGHT ENDOSCOPIC SPHENOIDECTOMY;  Surgeon: Daniel Moccasin, MD;  Location: [facility];  Service: ENT;  Laterality: Right;   TONSILLECTOMY AND ADENOIDECTOMY  child   TOTAL KNEE ARTHROPLASTY Left 10/14/2014   Procedure: LEFT TOTAL KNEE ARTHROPLASTY;  Surgeon: Donnice JONETTA Car, MD;  [facility];  Service: Orthopedics;  Laterality: Left;   TOTAL KNEE ARTHROPLASTY Right 1990s   TRANSTHORACIC ECHOCARDIOGRAM  12-24-2015   dr rolan   ef 55-60%,  grade 1 diastolic dysfunction/  trivial AR and TR  mild MR and PR/  moderate LAE   TRANSURETHRAL RESECTION OF PROSTATE  06-11-2010   dr chales  [facility]   w/  GYRUS   TRANSURETHRAL RESECTION OF PROSTATE N/A 12/06/2017   Procedure: TRANSURETHRAL RESECTION OF THE PROSTATE (TURP)/ BIPOLAR;  Surgeon: Devere Lonni Righter, MD;  [facility];  Service: Urology;  Laterality: N/A;   TRANSURETHRAL RESECTION OF PROSTATE N/A 04/11/2018   Procedure: TRANSURETHRAL RESECTION OF THE PROSTATE (TURP);  Surgeon: Devere Lonni Righter, MD;  [facility];  Service: Urology;  Laterality: N/A;   UPPER GASTROINTESTINAL ENDOSCOPY     Social History   Socioeconomic History   Marital status: Married    Spouse name: Not on file   Number of children: 3   Years of education: Not on file   Highest education level: Not on file  Occupational History   Occupation: retired  Tobacco Use   Smoking status: Former    Current packs/day: 0.00    Average packs/day: 2.0  packs/day for 15.0 years (30.0 ttl pk-yrs)    Types: Cigarettes    Start date: 11/29/1952    Quit date: 11/30/1967    Years since quitting: 56.5   Smokeless tobacco: Never  Vaping Use   Vaping status: Never Used  Substance and Sexual Activity   Alcohol  use: Yes    Alcohol /week: 2.0 standard drinks of alcohol     Types: 2 Standard drinks or equivalent per week    Comment: occasional   Drug use: No   Sexual  activity: Yes  Other Topics Concern   Not on file  Social History Narrative   Do you drink/eat things with caffeine? Very Little    Marital status/What year were you married? Married since 1961   Do you live in a house, apartment, assisted living, condo, trailer, etc.? Did not answer   Is it one or more stories? One   How many persons live in your home? 2    Do you have any pets in your home? No   Current or past profession: Brewing technologist.    Do you exercise? Very little    Type and how often? Did not answer    Do you have a living will? Yes   Do you have a DNR form? Did not answer   Do you have signed POA/HPOA forms? Did not answer   Social Drivers of Corporate investment banker Strain: Not on file  Food Insecurity: Not on file  Transportation Needs: Not on file  Physical Activity: Not on file  Stress: Not on file  Social Connections: Not on file   Family History  Problem Relation Age of Onset   Healthy Mother    Healthy Father    Other Sister    Healthy Sister    Thyroid  cancer Brother    Heart disease Brother    Diabetes type I Son    Colon cancer Neg Hx    Esophageal cancer Neg Hx    Stomach cancer Neg Hx    Rectal cancer Neg Hx    Allergies  Allergen Reactions   Cyclobenzaprine Other (See Comments)    Lost balance   Lisinopril  Other (See Comments)    Affected ability to urinate   Relafen [Nabumetone] Other (See Comments)    Problems urinating afterwards    Statins Other (See Comments)    my peeing   Current Outpatient Medications  Medication Sig  Dispense Refill   APIXABAN  (ELIQUIS ) VTE STARTER PACK (10MG  AND 5MG ) Take as directed on package: start with two-5mg  tablets twice daily for 7 days. On day 8, switch to one-5mg  tablet twice daily. 74 each 0   AZO-CRANBERRY PO Take 1 tablet by mouth daily.     carvedilol  (COREG ) 6.25 MG tablet TAKE 1 TABLET BY MOUTH TWICE A DAY 30 tablet 0   ciprofloxacin  (CIPRO ) 500 MG tablet Take 500 mg by mouth 2 (two) times daily as needed.     Cyanocobalamin  (VITAMIN B12) 1000 MCG TBCR Take 1,000 mcg by mouth.     diazepam  (VALIUM ) 5 MG tablet TAKE 1/2 TABLET BY MOUTH 2 (TWO) TIMES DAILY. 1/2 TABLET EVERY 2-3 DAYS 30 tablet 1   fluticasone  furoate-vilanterol (BREO ELLIPTA ) 200-25 MCG/ACT AEPB Inhale 1 puff into the lungs daily. With Spacer 1 each 11   gabapentin  (NEURONTIN ) 300 MG capsule TAKE 1 CAPSULE EVERY DAY 90 capsule 3   levothyroxine  (SYNTHROID ) 25 MCG tablet Take 1 tablet (25 mcg total) by mouth daily before breakfast. Tuesday Thursday Friday Saturday 90 tablet 6   levothyroxine  (SYNTHROID ) 50 MCG tablet TAKE 1 TABLET (50 MCG TOTAL) BY MOUTH DAILY BEFORE BREAKFAST. MONDAY WEDNESDAY SUNDAY 30 tablet 3   omeprazole  (PRILOSEC) 20 MG capsule TAKE 1 CAPSULE BY MOUTH EVERY DAY 90 capsule 3   polyethylene glycol (MIRALAX  / GLYCOLAX ) packet Take 17 g by mouth daily as needed for mild constipation.     Probiotic Product (PROBIOTIC PO) Take 1 capsule by mouth daily.     trimethoprim  (TRIMPEX ) 100 MG tablet Take 100  mg by mouth 2 (two) times daily.     No current facility-administered medications for this visit.   No results found.  Review of Systems:   A ROS was performed including pertinent positives and negatives as documented in the HPI.  Physical Exam :   Constitutional: NAD and appears stated age Neurological: Alert and oriented Psych: Appropriate affect and cooperative There were no vitals taken for this visit.   Comprehensive Musculoskeletal Exam:    Exam of the right arm demonstrates  tenderness with palpation at the elbow without any visible deformity.  There is moderate swelling beginning in the distal third of the forearm extending through the wrist, hand, and into the digits.  Limited wrist range of motion due to pain.  Radial pulse 2+.  Distal sensation is intact.  Imaging:   Xray (right elbow 4 views, right hand 3 views): Calcifications noted off of the lateral epicondyle suggestive of chronic epicondylitis.  Mild degenerative changes within the elbow joint.  Notable chondrocalcinosis within the radiocarpal and intercarpal joints.  Diffuse DIP and PIP osteoarthritis.   I personally reviewed and interpreted the radiographs.   Assessment:   89 y.o. male with 1 week history of right elbow, wrist, and hand pain with notable swelling extending to the hand from the distal forearm.  X-rays taken today show diffuse degenerative changes of both the elbow and wrist with chondrocalcinosis within the wrist as well.  Discussed that I pseudogout flare could be a contributor to his symptoms, however given the amount of swelling present with his recent diagnosis of a lower extremity DVT, I would ultimately like to rule out a vascular cause before proceeding with treatment.  He does follow-up later today with vascular and I would recommend obtaining an upper extremity Doppler to assess further.  He is currently on Eliquis  for anticoagulation.  Should this all check out well, we could plan to have him return likely tomorrow for further treatment, possibly considering an injection.  Plan :    - Follow-up with vascular for evaluation and right upper extremity ultrasound - If negative, return to clinic as soon as possible for potential injection     I personally saw and evaluated the patient, and participated in the management and treatment plan.  Leonce Reveal, PA-C Orthopedics

## 2024-06-05 ENCOUNTER — Telehealth (HOSPITAL_BASED_OUTPATIENT_CLINIC_OR_DEPARTMENT_OTHER): Payer: Self-pay | Admitting: Student

## 2024-06-05 DIAGNOSIS — G9009 Other idiopathic peripheral autonomic neuropathy: Secondary | ICD-10-CM | POA: Diagnosis not present

## 2024-06-05 DIAGNOSIS — R2689 Other abnormalities of gait and mobility: Secondary | ICD-10-CM | POA: Diagnosis not present

## 2024-06-05 DIAGNOSIS — M6281 Muscle weakness (generalized): Secondary | ICD-10-CM | POA: Diagnosis not present

## 2024-06-05 DIAGNOSIS — I82492 Acute embolism and thrombosis of other specified deep vein of left lower extremity: Secondary | ICD-10-CM | POA: Diagnosis not present

## 2024-06-05 NOTE — Telephone Encounter (Signed)
 Looks like he no showed vascular appt. Please advise

## 2024-06-05 NOTE — Telephone Encounter (Signed)
 Patient said he was supposed to get an injection does not know what kind. Please advise

## 2024-06-06 ENCOUNTER — Ambulatory Visit (HOSPITAL_BASED_OUTPATIENT_CLINIC_OR_DEPARTMENT_OTHER): Admitting: Student

## 2024-06-06 ENCOUNTER — Encounter: Payer: Self-pay | Admitting: Adult Health

## 2024-06-06 ENCOUNTER — Other Ambulatory Visit (HOSPITAL_BASED_OUTPATIENT_CLINIC_OR_DEPARTMENT_OTHER): Payer: Self-pay

## 2024-06-06 ENCOUNTER — Non-Acute Institutional Stay (SKILLED_NURSING_FACILITY): Payer: Self-pay | Admitting: Adult Health

## 2024-06-06 DIAGNOSIS — J411 Mucopurulent chronic bronchitis: Secondary | ICD-10-CM | POA: Diagnosis not present

## 2024-06-06 DIAGNOSIS — D696 Thrombocytopenia, unspecified: Secondary | ICD-10-CM | POA: Diagnosis not present

## 2024-06-06 DIAGNOSIS — K219 Gastro-esophageal reflux disease without esophagitis: Secondary | ICD-10-CM

## 2024-06-06 DIAGNOSIS — N138 Other obstructive and reflux uropathy: Secondary | ICD-10-CM

## 2024-06-06 DIAGNOSIS — R278 Other lack of coordination: Secondary | ICD-10-CM | POA: Diagnosis not present

## 2024-06-06 DIAGNOSIS — I82442 Acute embolism and thrombosis of left tibial vein: Secondary | ICD-10-CM

## 2024-06-06 DIAGNOSIS — R413 Other amnesia: Secondary | ICD-10-CM

## 2024-06-06 DIAGNOSIS — G609 Hereditary and idiopathic neuropathy, unspecified: Secondary | ICD-10-CM | POA: Diagnosis not present

## 2024-06-06 DIAGNOSIS — G9009 Other idiopathic peripheral autonomic neuropathy: Secondary | ICD-10-CM | POA: Diagnosis not present

## 2024-06-06 DIAGNOSIS — E039 Hypothyroidism, unspecified: Secondary | ICD-10-CM | POA: Diagnosis not present

## 2024-06-06 DIAGNOSIS — R2681 Unsteadiness on feet: Secondary | ICD-10-CM | POA: Diagnosis not present

## 2024-06-06 DIAGNOSIS — N401 Enlarged prostate with lower urinary tract symptoms: Secondary | ICD-10-CM

## 2024-06-06 DIAGNOSIS — M6281 Muscle weakness (generalized): Secondary | ICD-10-CM | POA: Diagnosis not present

## 2024-06-06 DIAGNOSIS — I82492 Acute embolism and thrombosis of other specified deep vein of left lower extremity: Secondary | ICD-10-CM | POA: Diagnosis not present

## 2024-06-06 DIAGNOSIS — M25531 Pain in right wrist: Secondary | ICD-10-CM

## 2024-06-06 DIAGNOSIS — R2689 Other abnormalities of gait and mobility: Secondary | ICD-10-CM | POA: Diagnosis not present

## 2024-06-06 NOTE — Telephone Encounter (Signed)
 I called and talked to the pt. He had a fall and was unable to make it to the vascular appointment. He would like for you to call him to create a plan

## 2024-06-06 NOTE — Telephone Encounter (Signed)
 U/s scheduled for 1:30 and f/u with leonce was made after. Called and advised pt

## 2024-06-06 NOTE — Progress Notes (Signed)
 Location:  [facility] Room Number: 159 P Place of Service:  SNF ([phone]) Provider: Tawni America, NP    Patient Care Team: Charlanne Fredia CROME, MD as PCP - General (Internal Medicine) Okey Vina GAILS, MD as PCP - Cardiology (Cardiology) Tobie Tonita POUR, DO as Consulting Physician (Neurology) Patel, Donika K, DO as Consulting Physician (Neurology) Devere Lonni Righter, MD as Consulting Physician (Urology) Joshua Blamer, MD as Consulting Physician (Dermatology)  Extended Emergency Contact Information Primary Emergency Contact: Oquinn,Honor Address: [address]          [place], [place] [address] [place] Home Phone: [phone] Mobile Phone: [phone] Relation: Spouse Secondary Emergency Contact: Joshua Shed Address: [address] [place], [place] [address] [place] Mobile Phone: [phone] Relation: Son  Code Status:  Full Code Goals of care: Advanced Directive information    05/29/2024   10:42 AM  Advanced Directives  Does Patient Have a Medical Advance Directive? No  Would patient like information on creating a medical advance directive? No - Patient declined     Chief Complaint  Patient presents with   Acute Visit    Hospital follow up    HPI:  Seen for admission to rehab due to gait issues and weakness   He has  left leg pain and swelling, diagnosed as a deep vein thrombosis (DVT) in the emergency room on June 25th. He previously reports a history of cellulitis in the same leg, which previously caused significant swelling. No current pain in the left leg due to neuropathy.  He has right wrist pain and swelling, described as severe enough to prevent the use of a cane/walker. He has a history of inflammatory arthritis affecting both wrists, with the left wrist previously requiring an injection for pain relief. Prednisone  has been used in the past for pain management.  He was treated for  chronic bronchitis with prednisone  and doxycycline  on 04/30/24. This was a recurrent issue and so Breo was added. He reports much improved symptoms and he quit the [facility] a few days ago. CT of the chest 02/08/24 done for chronic cough showed no acute abnormality, but did indicate COPD and right bronchiectasis. As well as a pancreatic cyst.   He is currently in a rehabilitation facility and mention difficulty with mobility due to wrist pain, which limits his ability to use a cane. He is waiting on this to improve prior to starting PT  He has a hx of neurogenic bladder, BPH, and  a history of urinary tract infections and is on trimethoprim  for prevention. He self-catheterizes four times a day and have had one or two urinary tract infections in the past seven years.  Past Medical History:  Diagnosis Date   Alcoholic peripheral neuropathy [facility])    neurologist-  dr patel--- feet numbness and sensory ataxia   Arthritis    B12 deficiency    BPH (benign prostatic hyperplasia)    CAD (coronary artery disease) cardiolgoist-  dr lonni end (previouly dr dalton rolan)   a. Myoview 11/13:  EF 38%, inf and IS defect c/w scar but no ischemia:  b. Cardiac CT 11/13:  Ca score 318 Agatson units, pLAD and dCFX plaque;   c. LHC 11/07/12:  pLAD 30%, oD1 40%, oCFX 30%, dCFX 40-50%, mRCA 40%, EF 50% (frequent PVCs and short run of NSVT with injection/    per last echo 01/ 2017  ef 55-60%   Chronic  constipation    Diverticulosis of colon    Esophageal reflux    External hemorrhoids    First degree heart block    Hiatal hernia    History of adenomatous polyp of colon    History of esophageal stricture 08/11/2015   s/p  dilatation   History of lower GI bleeding 03/01/2017   s/p  flexiable simoidscopy w/ clipping rectum ulcer   Hypogonadism male    Hypothyroidism    Interstitial cystitis    Irritable bowel syndrome    LBBB (left bundle branch block)    NICM (nonischemic cardiomyopathy) [facility]) cardiologist-  dr  lonni end (previously dr dalton rolan)--  per last echo 01/ 2017  ef 55-60%   ? 2/2 LBBB => a. echo 11/13: diff HK, worse in septum and apex, mod LVE, mild LVH, EF 40%, mild AI, mild MR, mod LAE   Pre-diabetes    Self-catheterizes urinary bladder    bid to tid    Wears glasses    Wears hearing aid in both ears    Past Surgical History:  Procedure Laterality Date   CARDIAC CATHETERIZATION  11-07-2012    dr rolan   nonobstructive CAD (pLAD 30%, ostial D1 40%, ostial LCx 30%, dLCx diffuse 40-50%, mRCA 40%) ;  LVSF 50% but diffiult due to PVCs and short run VT with injection;  cardiomyopathy mostly likely a LBBB cardiomyopathy   CARDIOVASCULAR STRESS TEST  10-16-2012   dr rolan   Low risk adenosine  nuclear study (no exercise) w/ a fixed inferior and inferoseptal defect without ischemia (question as whether this abnormality due to LBBB cardiomyopathy or due to scar)/  LV ef 38%,  LV wall motion decreased of the septum and entire apex   CATARACT EXTRACTION W/ INTRAOCULAR LENS  IMPLANT, BILATERAL  2012  approx.   COLONOSCOPY  2016   Dr. Abran, Per new patient form   CYSTO/  HYDRODISTENTION/  BLADDER BIOPSY  04-20-2005    dr renay moats  [facility]   ETHMOIDECTOMY Right 12/12/2016   Procedure: RIGHT ENDOSCOPIC ETHMOIDECTOMY;  Surgeon: Daniel Moccasin, MD;  Location: [facility];  Service: ENT;  Laterality: Right;   FLEXIBLE SIGMOIDOSCOPY N/A 03/02/2017   Procedure: FLEXIBLE SIGMOIDOSCOPY;  Surgeon: Gwendlyn ONEIDA Buddy, MD;  Location: [facility];  Service: Endoscopy;  Laterality: N/A;   LUMBAR LAMINECTOMY  1952   MAXILLARY ANTROSTOMY Right 12/12/2016   Procedure: RIGHT ENDOSCOPIC MAXILLARY ANTROSTOMY;  Surgeon: Daniel Moccasin, MD;  Location: [facility];  Service: ENT;  Laterality: Right;   SINUS ENDO W/FUSION Right 12/12/2016   Procedure: ENDOSCOPIC SINUS SURGERY WITH NAVIGATION;  Surgeon: Daniel Moccasin, MD;  Location: [facility];  Service: ENT;  Laterality: Right;    SPHENOIDECTOMY Right 12/12/2016   Procedure: RIGHT ENDOSCOPIC SPHENOIDECTOMY;  Surgeon: Daniel Moccasin, MD;  Location: [facility];  Service: ENT;  Laterality: Right;   TONSILLECTOMY AND ADENOIDECTOMY  child   TOTAL KNEE ARTHROPLASTY Left 10/14/2014   Procedure: LEFT TOTAL KNEE ARTHROPLASTY;  Surgeon: Donnice JONETTA Car, MD;  Location: [facility];  Service: Orthopedics;  Laterality: Left;   TOTAL KNEE ARTHROPLASTY Right 1990s   TRANSTHORACIC ECHOCARDIOGRAM  12-24-2015   dr rolan   ef 55-60%,  grade 1 diastolic dysfunction/  trivial AR and TR  mild MR and PR/  moderate LAE   TRANSURETHRAL RESECTION OF PROSTATE  06-11-2010   dr chales  [facility]   w/  GYRUS   TRANSURETHRAL RESECTION OF PROSTATE N/A 12/06/2017   Procedure: TRANSURETHRAL  RESECTION OF THE PROSTATE (TURP)/ BIPOLAR;  Surgeon: Devere Lonni Righter, MD;  Location: [facility];  Service: Urology;  Laterality: N/A;   TRANSURETHRAL RESECTION OF PROSTATE N/A 04/11/2018   Procedure: TRANSURETHRAL RESECTION OF THE PROSTATE (TURP);  Surgeon: Devere Lonni Righter, MD;  Location: [facility];  Service: Urology;  Laterality: N/A;   UPPER GASTROINTESTINAL ENDOSCOPY      Allergies  Allergen Reactions   Cyclobenzaprine Other (See Comments)    Lost balance   Lisinopril  Other (See Comments)    Affected ability to urinate   Relafen [Nabumetone] Other (See Comments)    Problems urinating afterwards    Statins Other (See Comments)    my peeing    Outpatient Encounter Medications as of 06/06/2024  Medication Sig   APIXABAN  (ELIQUIS ) VTE STARTER PACK (10MG  AND 5MG ) Take as directed on package: start with two-5mg  tablets twice daily for 7 days. On day 8, switch to one-5mg  tablet twice daily.   AZO-CRANBERRY PO Take 1 tablet by mouth daily.   carvedilol  (COREG ) 6.25 MG tablet TAKE 1 TABLET BY MOUTH TWICE A DAY   ciprofloxacin  (CIPRO ) 500 MG tablet Take 500 mg by mouth 2 (two) times daily as needed.   Cyanocobalamin  (VITAMIN  B12) 1000 MCG TBCR Take 1,000 mcg by mouth.   diazepam  (VALIUM ) 5 MG tablet TAKE 1/2 TABLET BY MOUTH 2 (TWO) TIMES DAILY. 1/2 TABLET EVERY 2-3 DAYS   fluticasone  furoate-vilanterol (BREO ELLIPTA ) 200-25 MCG/ACT AEPB Inhale 1 puff into the lungs daily. With Spacer   gabapentin  (NEURONTIN ) 300 MG capsule TAKE 1 CAPSULE EVERY DAY   levothyroxine  (SYNTHROID ) 25 MCG tablet Take 1 tablet (25 mcg total) by mouth daily before breakfast. Tuesday Thursday Friday Saturday   levothyroxine  (SYNTHROID ) 50 MCG tablet TAKE 1 TABLET (50 MCG TOTAL) BY MOUTH DAILY BEFORE BREAKFAST. MONDAY WEDNESDAY SUNDAY   MAGNESIUM  HYDROXIDE PO Take 45 mLs by mouth daily.   omeprazole  (PRILOSEC) 20 MG capsule TAKE 1 CAPSULE BY MOUTH EVERY DAY   polyethylene glycol (MIRALAX  / GLYCOLAX ) packet Take 17 g by mouth daily as needed for mild constipation.   Probiotic Product (PROBIOTIC PO) Take 1 capsule by mouth daily.   trimethoprim  (TRIMPEX ) 100 MG tablet Take 100 mg by mouth 2 (two) times daily.   No facility-administered encounter medications on file as of 06/06/2024.    Review of Systems  Immunization History  Administered Date(s) Administered   Dtap, Unspecified 07/17/2017   Influenza Split 09/14/2010, 09/28/2011, 08/24/2012, 09/12/2013, 09/01/2014   Influenza, High Dose Seasonal PF 08/27/2018, 07/29/2019, 09/25/2021, 09/07/2023   Influenza,inj,Quad PF,6+ Mos 09/13/2013, 09/01/2014, 08/06/2015   Influenza-Unspecified 08/20/2016, 08/08/2022   Moderna SARS-COV2 Booster Vaccination 10/20/2020   Moderna Sars-Covid-2 Vaccination 12/17/2019, 01/14/2020   Pneumococcal Conjugate-13 11/11/2013   Pneumococcal Polysaccharide-23 11/23/2007   Td 07/17/2017   Tdap 11/23/2007, 04/04/2024   Tetanus 12/05/2017   Unspecified SARS-COV-2 Vaccination 12/17/2019, 01/14/2020   Zoster Recombinant(Shingrix) 09/05/2017, 12/04/2017   Zoster, Live 09/09/2007, 09/05/2017, 12/04/2017   Pertinent  Health Maintenance Due  Topic Date Due    INFLUENZA VACCINE  07/05/2024   Colonoscopy  Discontinued      04/11/2023    2:53 PM 05/08/2023    3:39 PM 07/31/2023    2:39 PM 09/04/2023    1:34 PM 02/06/2024   11:11 AM  Fall Risk  Falls in the past year? 0 0 0 0 0  Was there an injury with Fall? 0 0 0 0 0  Fall Risk Category Calculator 0 0 0 0 0  Patient at Risk for Falls Due to Impaired balance/gait Impaired balance/gait No Fall Risks  No Fall Risks  Fall risk Follow up Falls evaluation completed Falls evaluation completed Falls evaluation completed Falls evaluation completed Falls evaluation completed   Functional Status Survey:    Vitals:   06/06/24 0842  BP: 116/63  Pulse: 85  Resp: 18  Temp: 98.5 F (36.9 C)  SpO2: 91%  Weight: 163 lb 6.4 oz (74.1 kg)  Height: 5' 11 (1.803 m)   Body mass index is 22.79 kg/m. Physical Exam  Labs reviewed: Recent Labs    07/06/23 1123 02/08/24 0905 05/29/24 1255  NA 138  --  140  K 4.3  --  4.3  CL 104  --  106  CO2 29  --  26  GLUCOSE 90  --  92  BUN 11  --  9  CREATININE 0.62 0.70 0.62  CALCIUM  9.1  --  9.2   Recent Labs    07/06/23 1123 05/29/24 1255  AST 14* 15  ALT 8 9  ALKPHOS 67 79  BILITOT 0.9 0.6  PROT 6.3* 6.1*  ALBUMIN 4.0 3.6   Recent Labs    07/06/23 1123 03/05/24 1130 05/29/24 1255  WBC 3.9* 3.9* 4.4  NEUTROABS 1.8 1.5* 2.3  HGB 12.2* 12.1* 11.4*  HCT 37.8* 38.0* 35.6*  MCV 94.5 95.5 94.2  PLT 106* 90* 117*   Lab Results  Component Value Date   TSH 5.58 09/26/2023   Lab Results  Component Value Date   HGBA1C 5.2 04/10/2018   Lab Results  Component Value Date   CHOL 136 02/08/2022   HDL 59 02/08/2022   LDLCALC 63 02/08/2022   TRIG 70 02/08/2022   CHOLHDL 3.2 03/24/2016    Significant Diagnostic Results in last 30 days:  US  Venous Img Lower Unilateral Left Result Date: 05/29/2024 CLINICAL DATA:  Pain and edema EXAM: Left LOWER EXTREMITY VENOUS DOPPLER ULTRASOUND TECHNIQUE: Gray-scale sonography with graded compression, as well as  color Doppler and duplex ultrasound were performed to evaluate the lower extremity deep venous systems from the level of the common femoral vein and including the common femoral, femoral, profunda femoral, popliteal and calf veins including the posterior tibial, peroneal and gastrocnemius veins when visible. The superficial great saphenous vein was also interrogated. Spectral Doppler was utilized to evaluate flow at rest and with distal augmentation maneuvers in the common femoral, femoral and popliteal veins. COMPARISON:  None Available. FINDINGS: Contralateral Common Femoral Vein: Respiratory phasicity is normal and symmetric with the symptomatic side. No evidence of thrombus. Normal compressibility. Common Femoral Vein: No evidence of thrombus. Normal compressibility, respiratory phasicity and response to augmentation. Saphenofemoral Junction: No evidence of thrombus. Normal compressibility and flow on color Doppler imaging. Profunda Femoral Vein: No evidence of thrombus. Normal compressibility and flow on color Doppler imaging. Femoral Vein: No evidence of thrombus. Normal compressibility, respiratory phasicity and response to augmentation. Popliteal Vein: No evidence of thrombus. Normal compressibility, respiratory phasicity and response to augmentation. Calf Veins: There is poor flow and compression of the posterior tibial vein consistent with below-the-knee thrombus. Superficial Great Saphenous Vein: No evidence of thrombus. Normal compressibility. Venous Reflux:  None. Other Findings:  None. IMPRESSION: Below-the-knee thrombus in the posterior tibial vein. No above the knee DVT. Electronically Signed   By: Ranell Bring M.D.   On: 05/29/2024 13:55   DG Tibia/Fibula Left Result Date: 05/29/2024 CLINICAL DATA:  Edema and pain. EXAM: LEFT TIBIA AND FIBULA - 2 VIEW COMPARISON:  None  Available. FINDINGS: Osteopenia. Mild diffuse soft tissue edema. Left total knee arthroplasty. No soft tissue gas. IMPRESSION:  Mild diffuse soft tissue edema without soft tissue gas. Electronically Signed   By: Newell Eke M.D.   On: 05/29/2024 13:20   DG Foot 2 Views Left Result Date: 05/29/2024 CLINICAL DATA:  Edema and pain. EXAM: LEFT FOOT - 2 VIEW COMPARISON:  None Available. FINDINGS: Osteopenia. Diffuse soft tissue swelling. No soft tissue gas. Degenerative changes in the midfoot. Calcaneal spur. IMPRESSION: 1. Diffuse soft tissue swelling.  No soft tissue gas. 2. Midfoot osteoarthritis. Electronically Signed   By: Newell Eke M.D.   On: 05/29/2024 13:20    Assessment/Plan  Deep Vein Thrombosis (DVT) Noted on ultrasound 05/29/24 to posterior tibial vein on the left started on Eliquis  F/U with vascular surgery recommended.  DVT in left leg, on Eliquis , advised compression hose for swelling management. - Order Doppler study for right upper extremity per ortho due to swelling.  - Order compression hose from pharmacy.  Right Wrist Pain Xray showing degenerative changes and chondrocalcinosis per ortho on 06/04/24 Not able to use nsaids due to eliquis  - Administer Tylenol  1 gram three times daily. - recommend prednisone   - ortho recommended a doppler to rule out DVT, if negative he can undergo an injection but that will not be for several days so will try prednisone  for now due to pain and swelling.   Chronic bronchitis with Bronchiectasis COPD with bronchiectasis managed with Breo inhaler. -improved after Breo, doxy, prednisone  -he stopped the Breo a few days ago, I recommend he resume it.  - Continue Breo inhaler.  Neurogenic bladder Recurrent UTIs managed with trimethoprim , self-catheterizes independently. - Continue trimethoprim .  Gait instability Has neuropathy in both feet, now left leg swelling affecting gait and right wrist swelling affecting his ability to use a walker.  - Plan physical therapy post wrist pain management.  Hypothyroidism Lab Results  Component Value Date   TSH 5.58  09/26/2023    Continue levothyroxine  Recheck TSH  Thrombocytopenia Chronic with mild pancytopenia per notes possibly due to alcohol  use, unknown liver disease, or myelodysplasia. Negative for multiple myeloma Plts 117L WBC 3.9-4.4 Hgb 11.4 Follows with Dr Cloretta monitoring only for now.   GERD Hx of dysphagia On prilosec no current issues.   Peripheral neuropathy Needs walker at all times Follows with neurology On gabapentin  and prn valium   Short term memory loss MMSE to be done in rehab.   CBC CMP 7/7

## 2024-06-07 ENCOUNTER — Encounter: Payer: Self-pay | Admitting: Adult Health

## 2024-06-07 DIAGNOSIS — R2689 Other abnormalities of gait and mobility: Secondary | ICD-10-CM | POA: Diagnosis not present

## 2024-06-07 DIAGNOSIS — M6281 Muscle weakness (generalized): Secondary | ICD-10-CM | POA: Diagnosis not present

## 2024-06-07 DIAGNOSIS — I82492 Acute embolism and thrombosis of other specified deep vein of left lower extremity: Secondary | ICD-10-CM | POA: Diagnosis not present

## 2024-06-07 DIAGNOSIS — I82409 Acute embolism and thrombosis of unspecified deep veins of unspecified lower extremity: Secondary | ICD-10-CM | POA: Insufficient documentation

## 2024-06-07 DIAGNOSIS — G9009 Other idiopathic peripheral autonomic neuropathy: Secondary | ICD-10-CM | POA: Diagnosis not present

## 2024-06-07 DIAGNOSIS — J42 Unspecified chronic bronchitis: Secondary | ICD-10-CM | POA: Insufficient documentation

## 2024-06-07 MED ORDER — PREDNISONE 20 MG PO TABS: 20.0000 mg | ORAL_TABLET | Freq: Two times a day (BID) | ORAL | Status: DC

## 2024-06-08 DIAGNOSIS — R278 Other lack of coordination: Secondary | ICD-10-CM | POA: Diagnosis not present

## 2024-06-08 DIAGNOSIS — I82492 Acute embolism and thrombosis of other specified deep vein of left lower extremity: Secondary | ICD-10-CM | POA: Diagnosis not present

## 2024-06-10 ENCOUNTER — Encounter (HOSPITAL_BASED_OUTPATIENT_CLINIC_OR_DEPARTMENT_OTHER)

## 2024-06-10 ENCOUNTER — Non-Acute Institutional Stay (SKILLED_NURSING_FACILITY): Payer: Self-pay | Admitting: Internal Medicine

## 2024-06-10 ENCOUNTER — Ambulatory Visit (HOSPITAL_BASED_OUTPATIENT_CLINIC_OR_DEPARTMENT_OTHER): Admitting: Student

## 2024-06-10 ENCOUNTER — Encounter: Payer: Self-pay | Admitting: Internal Medicine

## 2024-06-10 DIAGNOSIS — M25531 Pain in right wrist: Secondary | ICD-10-CM | POA: Diagnosis not present

## 2024-06-10 DIAGNOSIS — I82492 Acute embolism and thrombosis of other specified deep vein of left lower extremity: Secondary | ICD-10-CM | POA: Diagnosis not present

## 2024-06-10 DIAGNOSIS — I82442 Acute embolism and thrombosis of left tibial vein: Secondary | ICD-10-CM | POA: Diagnosis not present

## 2024-06-10 DIAGNOSIS — N138 Other obstructive and reflux uropathy: Secondary | ICD-10-CM | POA: Diagnosis not present

## 2024-06-10 DIAGNOSIS — I428 Other cardiomyopathies: Secondary | ICD-10-CM | POA: Diagnosis not present

## 2024-06-10 DIAGNOSIS — D696 Thrombocytopenia, unspecified: Secondary | ICD-10-CM | POA: Diagnosis not present

## 2024-06-10 DIAGNOSIS — G609 Hereditary and idiopathic neuropathy, unspecified: Secondary | ICD-10-CM

## 2024-06-10 DIAGNOSIS — R2689 Other abnormalities of gait and mobility: Secondary | ICD-10-CM | POA: Diagnosis not present

## 2024-06-10 DIAGNOSIS — R2681 Unsteadiness on feet: Secondary | ICD-10-CM | POA: Diagnosis not present

## 2024-06-10 DIAGNOSIS — K219 Gastro-esophageal reflux disease without esophagitis: Secondary | ICD-10-CM | POA: Diagnosis not present

## 2024-06-10 DIAGNOSIS — E039 Hypothyroidism, unspecified: Secondary | ICD-10-CM

## 2024-06-10 DIAGNOSIS — J411 Mucopurulent chronic bronchitis: Secondary | ICD-10-CM

## 2024-06-10 DIAGNOSIS — M6281 Muscle weakness (generalized): Secondary | ICD-10-CM | POA: Diagnosis not present

## 2024-06-10 DIAGNOSIS — K5901 Slow transit constipation: Secondary | ICD-10-CM

## 2024-06-10 DIAGNOSIS — R278 Other lack of coordination: Secondary | ICD-10-CM | POA: Diagnosis not present

## 2024-06-10 DIAGNOSIS — D508 Other iron deficiency anemias: Secondary | ICD-10-CM | POA: Diagnosis not present

## 2024-06-10 DIAGNOSIS — G9009 Other idiopathic peripheral autonomic neuropathy: Secondary | ICD-10-CM | POA: Diagnosis not present

## 2024-06-10 DIAGNOSIS — N401 Enlarged prostate with lower urinary tract symptoms: Secondary | ICD-10-CM

## 2024-06-10 DIAGNOSIS — I80299 Phlebitis and thrombophlebitis of other deep vessels of unspecified lower extremity: Secondary | ICD-10-CM | POA: Diagnosis not present

## 2024-06-10 LAB — CBC: RBC: 4.23 (ref 3.87–5.11)

## 2024-06-10 LAB — COMPREHENSIVE METABOLIC PANEL WITH GFR
Albumin: 3.7 (ref 3.5–5.0)
Calcium: 9.1 (ref 8.7–10.7)
Globulin: 2.3
eGFR: >90

## 2024-06-10 LAB — BASIC METABOLIC PANEL WITH GFR
BUN: 12 (ref 4–21)
CO2: 27 — AB (ref 13–22)
Chloride: 100 (ref 99–108)
Creatinine: 0.6 (ref 0.6–1.3)
Glucose: 108
Potassium: 4.3 meq/L (ref 3.5–5.1)
Sodium: 141 (ref 137–147)

## 2024-06-10 LAB — CBC AND DIFFERENTIAL
HCT: 40 — AB (ref 41–53)
Hemoglobin: 12.7 — AB (ref 13.5–17.5)
Platelets: 277 K/uL (ref 150–400)
WBC: 6

## 2024-06-10 LAB — HEPATIC FUNCTION PANEL
ALT: 29 U/L (ref 10–40)
AST: 31 (ref 14–40)
Alkaline Phosphatase: 73 (ref 25–125)
Bilirubin, Total: 0.4

## 2024-06-10 LAB — TSH: TSH: 3.55 (ref 0.41–5.90)

## 2024-06-10 NOTE — Progress Notes (Signed)
 Provider:    Charlanne Fredia CROME, MD  Location:  [facility] Room Number: [phone] Place of Service:  SNF ([id number])  PCP: Charlanne Fredia CROME, MD Patient Care Team: Charlanne Fredia CROME, MD as PCP - General (Internal Medicine) Okey Vina GAILS, MD as PCP - Cardiology (Cardiology) Tobie Tonita POUR, DO as Consulting Physician (Neurology) Tobie Tonita POUR, DO as Consulting Physician (Neurology) Devere Lonni Righter, MD as Consulting Physician (Urology) Joshua Blamer, MD as Consulting Physician (Dermatology)  Extended Emergency Contact Information Primary Emergency Contact: Claggett,Honor Address: [address]          [place], [place] [address] [place] Home Phone: [phone] Mobile Phone: [phone] Relation: Spouse Secondary Emergency Contact: Joshua Shed Address: [address] [place], [place] [address] [place] Mobile Phone: [phone] Relation: Son  Code Status: Full Code Goals of Care: Advanced Directive information    05/29/2024   10:42 AM  Advanced Directives  Does Patient Have a Medical Advance Directive? No  Would patient like information on creating a medical advance directive? No - Patient declined      Chief Complaint  Patient presents with   New Admit To SNF    HPI: Patient is a 89 y.o. male seen today for admission to SNF  [place]  Patient went to ED on 05/29/2024 /for left leg swelling and redness Was found to have a DVT in the posterior tibial vein Started on Eliquis   He also developed right wrist pain and swelling.  Making him unable to use a walker Since patient has Neurogenic Bladder and has  to self catheterize.  He was unable to do that in his apartment was also feeling very weak and unstable. It was decided to transfer him to rehab And rehab patient was started on low-dose of prednisone  And Dopplers were done which were negative for any DVT in that Arm Today patient is back to  his baseline he wants his Right wrist swelling is now completely gone with no pain.  He wants to go back to his apartment.  Per nurses he is doing all his ADLs and able to now use walker  Other issues Chronic Cough Started on Breo Much better His CT Scan in 3/25 had Shown Signs of COPD and Bronchiectasis  Rectal Incontinence with Constipation   Dysphagia Was seen by GI refused Further work up right now Has h/o Dilatation in 2021    Short Term Memory Changes   Peripheral Neuropathy  neuropathy is described as severe, but maintain mobility with the aid of a cane      He continues to drive, but only for short distances and avoids highways.    history of nonobstructive CAD, nonischemic cardiomyopathy, left bundle branch block and HLD Last echo showed EF of 55 to 60% with mild MR Follows with Cardiology   History of flaccid neurogenic bladder  catheterize at least once or twice in a day Recurrent UTIs follows with urology     History of insomnia with restless leg.  Is on Valium  and Tylenol  PM Pancreatic Cyst CT imaging Stable Recommended No Follow up needed Thrombocytopenia Sees Hematology Past Medical History:  Diagnosis Date   Alcoholic peripheral neuropathy [facility])    neurologist-  dr patel--- feet numbness and sensory ataxia   Arthritis    B12 deficiency    BPH (benign prostatic hyperplasia)    CAD (coronary artery disease) cardiolgoist-  dr christopher end (previouly dr dalton mclean)   a. Myoview 11/13:  EF 38%, inf and IS defect c/w scar but no ischemia:  b. Cardiac CT 11/13:  Ca score 318 Agatson units, pLAD and dCFX plaque;   c. LHC 11/07/12:  pLAD 30%, oD1 40%, oCFX 30%, dCFX 40-50%, mRCA 40%, EF 50% (frequent PVCs and short run of NSVT with injection/    per last echo 01/ 2017  ef 55-60%   Chronic constipation    Diverticulosis of colon    Esophageal reflux    External hemorrhoids    First degree heart block    Hiatal hernia    History of adenomatous polyp of colon     History of esophageal stricture 08/11/2015   s/p  dilatation   History of lower GI bleeding 03/01/2017   s/p  flexiable simoidscopy w/ clipping rectum ulcer   Hypogonadism male    Hypothyroidism    Interstitial cystitis    Irritable bowel syndrome    LBBB (left bundle branch block)    NICM (nonischemic cardiomyopathy) [facility]) cardiologist-  dr lonni end (previously dr dalton rolan)--  per last echo 01/ 2017  ef 55-60%   ? 2/2 LBBB => a. echo 11/13: diff HK, worse in septum and apex, mod LVE, mild LVH, EF 40%, mild AI, mild MR, mod LAE   Pre-diabetes    Self-catheterizes urinary bladder    bid to tid    Wears glasses    Wears hearing aid in both ears    Past Surgical History:  Procedure Laterality Date   CARDIAC CATHETERIZATION  11-07-2012    dr rolan   nonobstructive CAD (pLAD 30%, ostial D1 40%, ostial LCx 30%, dLCx diffuse 40-50%, mRCA 40%) ;  LVSF 50% but diffiult due to PVCs and short run VT with injection;  cardiomyopathy mostly likely a LBBB cardiomyopathy   CARDIOVASCULAR STRESS TEST  10-16-2012   dr rolan   Low risk adenosine  nuclear study (no exercise) w/ a fixed inferior and inferoseptal defect without ischemia (question as whether this abnormality due to LBBB cardiomyopathy or due to scar)/  LV ef 38%,  LV wall motion decreased of the septum and entire apex   CATARACT EXTRACTION W/ INTRAOCULAR LENS  IMPLANT, BILATERAL  2012  approx.   COLONOSCOPY  2016   Dr. Abran, Per new patient form   CYSTO/  HYDRODISTENTION/  BLADDER BIOPSY  04-20-2005    dr renay moats  [facility]   ETHMOIDECTOMY Right 12/12/2016   Procedure: RIGHT ENDOSCOPIC ETHMOIDECTOMY;  Surgeon: Daniel Moccasin, MD;  Location: [facility];  Service: ENT;  Laterality: Right;   FLEXIBLE SIGMOIDOSCOPY N/A 03/02/2017   Procedure: FLEXIBLE SIGMOIDOSCOPY;  Surgeon: Gwendlyn ONEIDA Buddy, MD;  Location: [facility];  Service: Endoscopy;  Laterality: N/A;   LUMBAR LAMINECTOMY  1952   MAXILLARY ANTROSTOMY Right  12/12/2016   Procedure: RIGHT ENDOSCOPIC MAXILLARY ANTROSTOMY;  Surgeon: Daniel Moccasin, MD;  Location: [facility];  Service: ENT;  Laterality: Right;   SINUS ENDO W/FUSION Right 12/12/2016   Procedure: ENDOSCOPIC SINUS SURGERY WITH NAVIGATION;  Surgeon: Daniel Moccasin, MD;  Location: [facility];  Service: ENT;  Laterality: Right;   SPHENOIDECTOMY Right 12/12/2016   Procedure: RIGHT ENDOSCOPIC SPHENOIDECTOMY;  Surgeon: Daniel Moccasin, MD;  Location: [facility];  Service: ENT;  Laterality: Right;   TONSILLECTOMY AND ADENOIDECTOMY  child   TOTAL KNEE ARTHROPLASTY Left 10/14/2014   Procedure: LEFT TOTAL KNEE ARTHROPLASTY;  Surgeon: Donnice JONETTA Car, MD;  Location: [facility];  Service: Orthopedics;  Laterality: Left;   TOTAL KNEE ARTHROPLASTY Right 1990s   TRANSTHORACIC ECHOCARDIOGRAM  12-24-2015   dr rolan   ef 55-60%,  grade 1 diastolic dysfunction/  trivial AR and TR  mild MR and PR/  moderate LAE   TRANSURETHRAL RESECTION OF PROSTATE  06-11-2010   dr chales  [facility]   w/  GYRUS   TRANSURETHRAL RESECTION OF PROSTATE N/A 12/06/2017   Procedure: TRANSURETHRAL RESECTION OF THE PROSTATE (TURP)/ BIPOLAR;  Surgeon: Devere Lonni Righter, MD;  Location: [facility];  Service: Urology;  Laterality: N/A;   TRANSURETHRAL RESECTION OF PROSTATE N/A 04/11/2018   Procedure: TRANSURETHRAL RESECTION OF THE PROSTATE (TURP);  Surgeon: Devere Lonni Righter, MD;  Location: [facility];  Service: Urology;  Laterality: N/A;   UPPER GASTROINTESTINAL ENDOSCOPY      reports that he quit smoking about 56 years ago. His smoking use included cigarettes. He started smoking about 71 years ago. He has a 30 pack-year smoking history. He has never used smokeless tobacco. He reports current alcohol  use of about 2.0 standard drinks of alcohol  per week. He reports that he does not use drugs. Social History   Socioeconomic History   Marital status: Married    Spouse name: Not on file    Number of children: 3   Years of education: Not on file   Highest education level: Not on file  Occupational History   Occupation: retired  Tobacco Use   Smoking status: Former    Current packs/day: 0.00    Average packs/day: 2.0 packs/day for 15.0 years (30.0 ttl pk-yrs)    Types: Cigarettes    Start date: 11/29/1952    Quit date: 11/30/1967    Years since quitting: 56.5   Smokeless tobacco: Never  Vaping Use   Vaping status: Never Used  Substance and Sexual Activity   Alcohol  use: Yes    Alcohol /week: 2.0 standard drinks of alcohol     Types: 2 Standard drinks or equivalent per week    Comment: occasional   Drug use: No   Sexual activity: Yes  Other Topics Concern   Not on file  Social History Narrative   Do you drink/eat things with caffeine? Very Little    Marital status/What year were you married? Married since 1961   Do you live in a house, apartment, assisted living, condo, trailer, etc.? Did not answer   Is it one or more stories? One   How many persons live in your home? 2    Do you have any pets in your home? No   Current or past profession: Brewing technologist.    Do you exercise? Very little    Type and how often? Did not answer    Do you have a living will? Yes   Do you have a DNR form? Did not answer   Do you have signed POA/HPOA forms? Did not answer   Social Drivers of Corporate investment banker Strain: Not on file  Food Insecurity: Not on file  Transportation Needs: Not on file  Physical Activity: Not on file  Stress: Not on file  Social Connections: Not on file  Intimate Partner Violence: Not on file    Functional Status Survey:    Family History  Problem Relation Age of Onset   Healthy Mother    Healthy Father    Other Sister    Healthy Sister    Thyroid  cancer Brother    Heart disease Brother  Diabetes type I Son    Colon cancer Neg Hx    Esophageal cancer Neg Hx    Stomach cancer Neg Hx    Rectal cancer Neg Hx     Health Maintenance   Topic Date Due   COVID-19 Vaccine (6 - 2024-25 season) 08/06/2023   Medicare Annual Wellness (AWV)  04/09/2024   INFLUENZA VACCINE  07/05/2024   DTaP/Tdap/Td (6 - Td or Tdap) 04/04/2034   Pneumococcal Vaccine: 50+ Years  Completed   Hepatitis B Vaccines  Aged Out   HPV VACCINES  Aged Out   Meningococcal B Vaccine  Aged Out   Colonoscopy  Discontinued   Zoster Vaccines- Shingrix  Discontinued    Allergies  Allergen Reactions   Cyclobenzaprine Other (See Comments)    Lost balance   Lisinopril  Other (See Comments)    Affected ability to urinate   Losartan      Per [facility]   Meloxicam     Per [facility]   Potassium     Per [facility]    Relafen [Nabumetone] Other (See Comments)    Problems urinating afterwards    Statins Other (See Comments)    my peeing    Outpatient Encounter Medications as of 06/10/2024  Medication Sig   acetaminophen  (TYLENOL ) 500 MG tablet Take 500 mg by mouth 3 (three) times daily.   APIXABAN  (ELIQUIS ) VTE STARTER PACK (10MG  AND 5MG ) Take as directed on package: start with two-5mg  tablets twice daily for 7 days. On day 8, switch to one-5mg  tablet twice daily.   AZO-CRANBERRY PO Take 1 tablet by mouth daily.   carvedilol  (COREG ) 6.25 MG tablet TAKE 1 TABLET BY MOUTH TWICE A DAY   ciprofloxacin  (CIPRO ) 500 MG tablet Take 500 mg by mouth 2 (two) times daily as needed.   Cyanocobalamin  (VITAMIN B12) 1000 MCG TBCR Take 1,000 mcg by mouth.   diazepam  (VALIUM ) 5 MG tablet TAKE 1/2 TABLET BY MOUTH 2 (TWO) TIMES DAILY. 1/2 TABLET EVERY 2-3 DAYS   fluticasone  furoate-vilanterol (BREO ELLIPTA ) 200-25 MCG/ACT AEPB Inhale 1 puff into the lungs daily. With Spacer   gabapentin  (NEURONTIN ) 300 MG capsule TAKE 1 CAPSULE EVERY DAY   levothyroxine  (SYNTHROID ) 25 MCG tablet Take 1 tablet (25 mcg total) by mouth daily before breakfast. Tuesday Thursday Friday Saturday   levothyroxine  (SYNTHROID ) 50 MCG tablet TAKE 1  TABLET (50 MCG TOTAL) BY MOUTH DAILY BEFORE BREAKFAST. MONDAY WEDNESDAY SUNDAY   MAGNESIUM  HYDROXIDE PO Take 45 mLs by mouth daily.   omeprazole  (PRILOSEC) 20 MG capsule TAKE 1 CAPSULE BY MOUTH EVERY DAY   polyethylene glycol (MIRALAX  / GLYCOLAX ) packet Take 17 g by mouth daily as needed for mild constipation.   predniSONE  (DELTASONE ) 20 MG tablet Take 1 tablet (20 mg total) by mouth 2 (two) times daily with a meal for 5 days.   Probiotic Product (PROBIOTIC PO) Take 1 capsule by mouth daily.   trimethoprim  (TRIMPEX ) 100 MG tablet Take 100 mg by mouth 2 (two) times daily.   No facility-administered encounter medications on file as of 06/10/2024.    Review of Systems  Constitutional:  Negative for activity change, appetite change and unexpected weight change.  HENT: Negative.    Respiratory:  Negative for cough and shortness of breath.   Cardiovascular:  Negative for leg swelling.  Gastrointestinal:  Negative for constipation.  Genitourinary:  Negative for frequency.  Musculoskeletal:  Positive for gait problem. Negative for arthralgias and myalgias.  Skin: Negative.  Negative for rash.  Neurological:  Negative for dizziness and weakness.  Psychiatric/Behavioral:  Negative for confusion and sleep disturbance.   All other systems reviewed and are negative.   Vitals:   06/10/24 1024  BP: 121/68  Pulse: 68  SpO2: 95%  Weight: 163 lb 6.4 oz (74.1 kg)  Height: 5' 11 (1.803 m)   Body mass index is 22.79 kg/m. Physical Exam Vitals reviewed.  Constitutional:      Appearance: Normal appearance.  HENT:     Head: Normocephalic.     Nose: Nose normal.     Mouth/Throat:     Mouth: Mucous membranes are moist.     Pharynx: Oropharynx is clear.  Eyes:     Pupils: Pupils are equal, round, and reactive to light.  Cardiovascular:     Rate and Rhythm: Normal rate and regular rhythm.     Pulses: Normal pulses.     Heart sounds: No murmur heard. Pulmonary:     Effort: Pulmonary effort is  normal. No respiratory distress.     Breath sounds: Normal breath sounds. No rales.  Abdominal:     General: Abdomen is flat. Bowel sounds are normal.     Palpations: Abdomen is soft.  Musculoskeletal:        General: Swelling present.     Cervical back: Neck supple.  Skin:    General: Skin is warm.  Neurological:     General: No focal deficit present.     Mental Status: He is alert and oriented to person, place, and time.  Psychiatric:        Mood and Affect: Mood normal.        Thought Content: Thought content normal.     Labs reviewed: Basic Metabolic Panel: Recent Labs    07/06/23 1123 02/08/24 0905 05/29/24 1255  NA 138  --  140  K 4.3  --  4.3  CL 104  --  106  CO2 29  --  26  GLUCOSE 90  --  92  BUN 11  --  9  CREATININE 0.62 0.70 0.62  CALCIUM  9.1  --  9.2   Liver Function Tests: Recent Labs    07/06/23 1123 05/29/24 1255  AST 14* 15  ALT 8 9  ALKPHOS 67 79  BILITOT 0.9 0.6  PROT 6.3* 6.1*  ALBUMIN 4.0 3.6   No results for input(s): LIPASE, AMYLASE in the last 8760 hours. No results for input(s): AMMONIA in the last 8760 hours. CBC: Recent Labs    07/06/23 1123 03/05/24 1130 05/29/24 1255  WBC 3.9* 3.9* 4.4  NEUTROABS 1.8 1.5* 2.3  HGB 12.2* 12.1* 11.4*  HCT 37.8* 38.0* 35.6*  MCV 94.5 95.5 94.2  PLT 106* 90* 117*   Cardiac Enzymes: No results for input(s): CKTOTAL, CKMB, CKMBINDEX, TROPONINI in the last 8760 hours. BNP: Invalid input(s): POCBNP Lab Results  Component Value Date   HGBA1C 5.2 04/10/2018   Lab Results  Component Value Date   TSH 5.58 09/26/2023   Lab Results  Component Value Date   VITAMINB12 920 02/21/2023   Lab Results  Component Value Date   FOLATE 12.2 05/06/2016   No results found for: IRON, TIBC, FERRITIN  Imaging and Procedures obtained prior to SNF admission: US  Venous Img Lower Unilateral Left Result Date: 05/29/2024 CLINICAL DATA:  Pain and edema EXAM: Left LOWER EXTREMITY  VENOUS DOPPLER ULTRASOUND TECHNIQUE: Gray-scale sonography with graded compression, as well as color Doppler and duplex ultrasound were performed to evaluate the  lower extremity deep venous systems from the level of the common femoral vein and including the common femoral, femoral, profunda femoral, popliteal and calf veins including the posterior tibial, peroneal and gastrocnemius veins when visible. The superficial great saphenous vein was also interrogated. Spectral Doppler was utilized to evaluate flow at rest and with distal augmentation maneuvers in the common femoral, femoral and popliteal veins. COMPARISON:  None Available. FINDINGS: Contralateral Common Femoral Vein: Respiratory phasicity is normal and symmetric with the symptomatic side. No evidence of thrombus. Normal compressibility. Common Femoral Vein: No evidence of thrombus. Normal compressibility, respiratory phasicity and response to augmentation. Saphenofemoral Junction: No evidence of thrombus. Normal compressibility and flow on color Doppler imaging. Profunda Femoral Vein: No evidence of thrombus. Normal compressibility and flow on color Doppler imaging. Femoral Vein: No evidence of thrombus. Normal compressibility, respiratory phasicity and response to augmentation. Popliteal Vein: No evidence of thrombus. Normal compressibility, respiratory phasicity and response to augmentation. Calf Veins: There is poor flow and compression of the posterior tibial vein consistent with below-the-knee thrombus. Superficial Great Saphenous Vein: No evidence of thrombus. Normal compressibility. Venous Reflux:  None. Other Findings:  None. IMPRESSION: Below-the-knee thrombus in the posterior tibial vein. No above the knee DVT. Electronically Signed   By: Ranell Bring M.D.   On: 05/29/2024 13:55   DG Tibia/Fibula Left Result Date: 05/29/2024 CLINICAL DATA:  Edema and pain. EXAM: LEFT TIBIA AND FIBULA - 2 VIEW COMPARISON:  None Available. FINDINGS: Osteopenia.  Mild diffuse soft tissue edema. Left total knee arthroplasty. No soft tissue gas. IMPRESSION: Mild diffuse soft tissue edema without soft tissue gas. Electronically Signed   By: Newell Eke M.D.   On: 05/29/2024 13:20   DG Foot 2 Views Left Result Date: 05/29/2024 CLINICAL DATA:  Edema and pain. EXAM: LEFT FOOT - 2 VIEW COMPARISON:  None Available. FINDINGS: Osteopenia. Diffuse soft tissue swelling. No soft tissue gas. Degenerative changes in the midfoot. Calcaneal spur. IMPRESSION: 1. Diffuse soft tissue swelling.  No soft tissue gas. 2. Midfoot osteoarthritis. Electronically Signed   By: Newell Eke M.D.   On: 05/29/2024 13:20    Assessment/Plan 1. Acute deep vein thrombosis (DVT) of tibial vein of left lower extremity (HCC) (Primary) On Eliquis  Starter Pack  2. Thrombocytopenia (HCC) Platelets stable Will Follow with Hematology  3. Peripheral neuropathy, idiopathicTrimpex Sees Neurology Uses Gabapentin   4. BPH with urinary obstruction Self catherizes  5. Mucopurulent chronic bronchitis (HCC) Using Breo which seems to has Helped  6. Right wrist pain Resolved with low dose of Prednisone  Will Follow with Dr Buren  7. Gait instability Now able to use his walker  8. Hypothyroidism, unspecified type TSH normal  9. Gastroesophageal reflux disease without esophagitis Prilosec  10. NICM (nonischemic cardiomyopathy) (HCC) Low dose of Coreg   11. Slow transit constipation Miralax  12 Valium  for Cramps Hardly uses it    Family/ staff Communication:   Labs/tests ordered:

## 2024-06-10 NOTE — Telephone Encounter (Signed)
 This Clinical research associate called patient to f/u on the order for a UE venous US  to rule out a clot. Patient had appt 06/10/24 at 1:30 but appt was cancelled. Spoke with patient to see if we needed to reschedule and he said the rehab facility performed the scan, finding no evidence of clot. Since patient is also now feeling better, order was cancelled.

## 2024-06-11 ENCOUNTER — Encounter: Admitting: Internal Medicine

## 2024-06-19 ENCOUNTER — Telehealth: Payer: Self-pay

## 2024-06-19 NOTE — Telephone Encounter (Signed)
 This is already on the patient medication list and is visible for anyone to see.

## 2024-06-19 NOTE — Telephone Encounter (Signed)
 Copied from CRM [id number]. Topic: Clinical - Prescription Issue >> Jun 19, 2024  1:54 PM Susanna ORN wrote: Reason for CRM: Patient called stating that he would like to add a medication to his medication list. Advised patient that I could not add a medication to the list but I could let the nurse/provider know. Patient states that he just got out of rehab at [facility] and he was placed on Eliquis  5 mg and he wanted Dr. Charlanne to know so that when it's time for refills, he can have her to send the refills to the pharmacy. He states his prescriptions are to go to the [facility] on [address].

## 2024-06-24 ENCOUNTER — Other Ambulatory Visit: Payer: Self-pay | Admitting: Internal Medicine

## 2024-06-24 DIAGNOSIS — I2510 Atherosclerotic heart disease of native coronary artery without angina pectoris: Secondary | ICD-10-CM

## 2024-06-24 DIAGNOSIS — I428 Other cardiomyopathies: Secondary | ICD-10-CM

## 2024-07-09 ENCOUNTER — Encounter: Payer: Self-pay | Admitting: Internal Medicine

## 2024-07-09 ENCOUNTER — Ambulatory Visit: Admitting: Internal Medicine

## 2024-07-09 VITALS — BP 100/56 | HR 65 | Temp 97.9°F | Ht 71.0 in | Wt 176.8 lb

## 2024-07-09 DIAGNOSIS — G609 Hereditary and idiopathic neuropathy, unspecified: Secondary | ICD-10-CM

## 2024-07-09 DIAGNOSIS — I959 Hypotension, unspecified: Secondary | ICD-10-CM | POA: Diagnosis not present

## 2024-07-09 DIAGNOSIS — N138 Other obstructive and reflux uropathy: Secondary | ICD-10-CM | POA: Diagnosis not present

## 2024-07-09 DIAGNOSIS — J411 Mucopurulent chronic bronchitis: Secondary | ICD-10-CM | POA: Diagnosis not present

## 2024-07-09 DIAGNOSIS — N401 Enlarged prostate with lower urinary tract symptoms: Secondary | ICD-10-CM | POA: Diagnosis not present

## 2024-07-09 DIAGNOSIS — D696 Thrombocytopenia, unspecified: Secondary | ICD-10-CM

## 2024-07-09 DIAGNOSIS — E039 Hypothyroidism, unspecified: Secondary | ICD-10-CM

## 2024-07-09 DIAGNOSIS — M25531 Pain in right wrist: Secondary | ICD-10-CM

## 2024-07-09 DIAGNOSIS — I82442 Acute embolism and thrombosis of left tibial vein: Secondary | ICD-10-CM

## 2024-07-09 MED ORDER — APIXABAN 5 MG PO TABS: 5.0000 mg | ORAL_TABLET | Freq: Two times a day (BID) | ORAL | 3 refills | Status: DC

## 2024-07-10 ENCOUNTER — Encounter (HOSPITAL_BASED_OUTPATIENT_CLINIC_OR_DEPARTMENT_OTHER): Payer: Self-pay | Admitting: Orthopaedic Surgery

## 2024-07-10 NOTE — Progress Notes (Signed)
 Location:   [facility]   Place of Service:   Clinic  Provider:   Code Status:  Goals of Care:     05/29/2024   10:42 AM  Advanced Directives  Does Patient Have a Medical Advance Directive? No  Would patient like information on creating a medical advance directive? No - Patient declined     Chief Complaint  Patient presents with   Medical Management of Chronic Issues    4 week follow up. Patient states he is having lack of strength. Need for Covid and flu vaccine as well as AWV.    HPI: Patient is a 89 y.o. male seen today for medical management of chronic diseases.   [place] in [place]   Acute DVT Patient went to ED on 05/29/2024 /for left leg swelling and redness Was found to have a DVT in the posterior tibial vein Started on Eliquis  Now on 5 mg BID Chronic Cough Started on Breo Much better His CT Scan in 3/25 had Shown Signs of COPD and Bronchiectasis  Rectal Incontinence with Constipation   Dysphagia Was seen by GI refused Further work up right now Has h/o Dilatation in 2021    Short Term Memory Changes   Peripheral Neuropathy  neuropathy is described as severe, but maintain mobility with the aid of a cane   He continues to drive, but only for short distances and avoids highways.    history of nonobstructive CAD, nonischemic cardiomyopathy, left bundle branch block and HLD Last echo showed EF of 55 to 60% with mild MR Follows with Cardiology   History of flaccid neurogenic bladder  catheterize at least once or twice in a day Recurrent UTIs follows with urology     History of insomnia with restless leg.  Is on Valium  and Tylenol  PM Pancreatic Cyst CT imaging Stable Recommended No Follow up needed Thrombocytopenia Sees Hematology  Discussed the use of AI scribe software for clinical note transcription with the patient, who gave verbal consent to proceed.  History of Present Illness   David Gutierrez is an 89 year old male who presents with leg  swelling and weakness.  He has significant swelling in his right foot, with bruising on one toe, which he attributes to blood thinner medication. Swelling alternates between his right foot and left ankle, with the right foot currently more affected. Swelling is less pronounced in the morning. He uses compression stockings but finds them difficult to put on.  He feels weak, especially since discharge from rehab, and experiences morning 'wooziness'. He has been on carvedilol  for a long time but is unsure of the reason.  Neuropathy makes walking difficult even with a cane. He experiences balance loss and leg weakness, though not painful. He uses a power chair for longer distances and still drives short distances but avoids long trips due to neuropathy and safety concerns.        Past Medical History:  Diagnosis Date   Alcoholic peripheral neuropathy [facility])    neurologist-  dr patel--- feet numbness and sensory ataxia   Arthritis    B12 deficiency    BPH (benign prostatic hyperplasia)    CAD (coronary artery disease) cardiolgoist-  dr lonni end (previouly dr dalton rolan)   a. Myoview 11/13:  EF 38%, inf and IS defect c/w scar but no ischemia:  b. Cardiac CT 11/13:  Ca score 318 Agatson units, pLAD and dCFX plaque;   c. LHC 11/07/12:  pLAD 30%, oD1 40%, oCFX  30%, dCFX 40-50%, mRCA 40%, EF 50% (frequent PVCs and short run of NSVT with injection/    per last echo 01/ 2017  ef 55-60%   Chronic constipation    Diverticulosis of colon    Esophageal reflux    External hemorrhoids    First degree heart block    Hiatal hernia    History of adenomatous polyp of colon    History of esophageal stricture 08/11/2015   s/p  dilatation   History of lower GI bleeding 03/01/2017   s/p  flexiable simoidscopy w/ clipping rectum ulcer   Hypogonadism male    Hypothyroidism    Interstitial cystitis    Irritable bowel syndrome    LBBB (left bundle branch block)    NICM (nonischemic cardiomyopathy)  [facility]) cardiologist-  dr lonni end (previously dr dalton rolan)--  per last echo 01/ 2017  ef 55-60%   ? 2/2 LBBB => a. echo 11/13: diff HK, worse in septum and apex, mod LVE, mild LVH, EF 40%, mild AI, mild MR, mod LAE   Pre-diabetes    Self-catheterizes urinary bladder    bid to tid    Wears glasses    Wears hearing aid in both ears     Past Surgical History:  Procedure Laterality Date   CARDIAC CATHETERIZATION  11-07-2012    dr rolan   nonobstructive CAD (pLAD 30%, ostial D1 40%, ostial LCx 30%, dLCx diffuse 40-50%, mRCA 40%) ;  LVSF 50% but diffiult due to PVCs and short run VT with injection;  cardiomyopathy mostly likely a LBBB cardiomyopathy   CARDIOVASCULAR STRESS TEST  10-16-2012   dr rolan   Low risk adenosine  nuclear study (no exercise) w/ a fixed inferior and inferoseptal defect without ischemia (question as whether this abnormality due to LBBB cardiomyopathy or due to scar)/  LV ef 38%,  LV wall motion decreased of the septum and entire apex   CATARACT EXTRACTION W/ INTRAOCULAR LENS  IMPLANT, BILATERAL  2012  approx.   COLONOSCOPY  2016   Dr. Abran, Per new patient form   CYSTO/  HYDRODISTENTION/  BLADDER BIOPSY  04-20-2005    dr renay moats  [facility]   ETHMOIDECTOMY Right 12/12/2016   Procedure: RIGHT ENDOSCOPIC ETHMOIDECTOMY;  Surgeon: Daniel Moccasin, MD;  Location: [facility];  Service: ENT;  Laterality: Right;   FLEXIBLE SIGMOIDOSCOPY N/A 03/02/2017   Procedure: FLEXIBLE SIGMOIDOSCOPY;  Surgeon: Gwendlyn ONEIDA Buddy, MD;  Location: [facility];  Service: Endoscopy;  Laterality: N/A;   LUMBAR LAMINECTOMY  1952   MAXILLARY ANTROSTOMY Right 12/12/2016   Procedure: RIGHT ENDOSCOPIC MAXILLARY ANTROSTOMY;  Surgeon: Daniel Moccasin, MD;  Location: [facility];  Service: ENT;  Laterality: Right;   SINUS ENDO W/FUSION Right 12/12/2016   Procedure: ENDOSCOPIC SINUS SURGERY WITH NAVIGATION;  Surgeon: Daniel Moccasin, MD;  Location: [facility];  Service: ENT;   Laterality: Right;   SPHENOIDECTOMY Right 12/12/2016   Procedure: RIGHT ENDOSCOPIC SPHENOIDECTOMY;  Surgeon: Daniel Moccasin, MD;  Location: [facility];  Service: ENT;  Laterality: Right;   TONSILLECTOMY AND ADENOIDECTOMY  child   TOTAL KNEE ARTHROPLASTY Left 10/14/2014   Procedure: LEFT TOTAL KNEE ARTHROPLASTY;  Surgeon: Donnice JONETTA Car, MD;  Location: [facility];  Service: Orthopedics;  Laterality: Left;   TOTAL KNEE ARTHROPLASTY Right 1990s   TRANSTHORACIC ECHOCARDIOGRAM  12-24-2015   dr rolan   ef 55-60%,  grade 1 diastolic dysfunction/  trivial AR and TR  mild MR and PR/  moderate LAE  TRANSURETHRAL RESECTION OF PROSTATE  06-11-2010   dr chales  [facility]   w/  GYRUS   TRANSURETHRAL RESECTION OF PROSTATE N/A 12/06/2017   Procedure: TRANSURETHRAL RESECTION OF THE PROSTATE (TURP)/ BIPOLAR;  Surgeon: Devere Lonni Righter, MD;  Location: [facility];  Service: Urology;  Laterality: N/A;   TRANSURETHRAL RESECTION OF PROSTATE N/A 04/11/2018   Procedure: TRANSURETHRAL RESECTION OF THE PROSTATE (TURP);  Surgeon: Devere Lonni Righter, MD;  Location[facility];  Service: Urology;  Laterality: N/A;   UPPER GASTROINTESTINAL ENDOSCOPY      Allergies  Allergen Reactions   Cyclobenzaprine Other (See Comments)    Lost balance   Lisinopril  Other (See Comments)    Affected ability to urinate   Losartan      Per [facility]   Meloxicam     Per [facility]   Potassium     Per [facility]    Relafen [Nabumetone] Other (See Comments)    Problems urinating afterwards    Statins Other (See Comments)    my peeing    Outpatient Encounter Medications as of 07/09/2024  Medication Sig   acetaminophen  (TYLENOL ) 500 MG tablet Take 500 mg by mouth 3 (three) times daily. (Patient taking differently: Take 325 mg by mouth daily as needed.)   apixaban  (ELIQUIS ) 5 MG TABS tablet Take 1 tablet (5 mg total) by mouth 2 (two) times daily.    AZO-CRANBERRY PO Take 1 tablet by mouth daily.   ciprofloxacin  (CIPRO ) 500 MG tablet Take 500 mg by mouth 2 (two) times daily as needed.   Cyanocobalamin  (VITAMIN B12) 1000 MCG TBCR Take 1,000 mcg by mouth.   diazepam  (VALIUM ) 5 MG tablet TAKE 1/2 TABLET BY MOUTH 2 (TWO) TIMES DAILY. 1/2 TABLET EVERY 2-3 DAYS   fluticasone  furoate-vilanterol (BREO ELLIPTA ) 200-25 MCG/ACT AEPB Inhale 1 puff into the lungs daily. With Spacer   gabapentin  (NEURONTIN ) 300 MG capsule TAKE 1 CAPSULE EVERY DAY   levothyroxine  (SYNTHROID ) 25 MCG tablet Take 1 tablet (25 mcg total) by mouth daily before breakfast. Tuesday Thursday Friday Saturday   levothyroxine  (SYNTHROID ) 50 MCG tablet TAKE 1 TABLET (50 MCG TOTAL) BY MOUTH DAILY BEFORE BREAKFAST. MONDAY WEDNESDAY SUNDAY   omeprazole  (PRILOSEC) 20 MG capsule TAKE 1 CAPSULE BY MOUTH EVERY DAY   polyethylene glycol (MIRALAX  / GLYCOLAX ) packet Take 17 g by mouth daily as needed for mild constipation.   Probiotic Product (PROBIOTIC PO) Take 1 capsule by mouth daily.   trimethoprim  (TRIMPEX ) 100 MG tablet Take 100 mg by mouth 2 (two) times daily.   [DISCONTINUED] APIXABAN  (ELIQUIS ) VTE STARTER PACK (10MG  AND 5MG ) Take as directed on package: start with two-5mg  tablets twice daily for 7 days. On day 8, switch to one-5mg  tablet twice daily.   [DISCONTINUED] carvedilol  (COREG ) 6.25 MG tablet TAKE 1 TABLET BY MOUTH TWICE A DAY   [DISCONTINUED] MAGNESIUM  HYDROXIDE PO Take 45 mLs by mouth daily. (Patient not taking: Reported on 07/09/2024)   No facility-administered encounter medications on file as of 07/09/2024.    Review of Systems:  Review of Systems  Constitutional:  Negative for activity change, appetite change and unexpected weight change.  HENT: Negative.    Respiratory:  Negative for cough and shortness of breath.   Cardiovascular:  Positive for leg swelling.  Gastrointestinal:  Negative for constipation.  Genitourinary:  Negative for frequency.  Musculoskeletal:   Positive for gait problem. Negative for arthralgias and myalgias.  Skin: Negative.  Negative for rash.  Neurological:  Positive for dizziness and weakness.  Psychiatric/Behavioral:  Negative for confusion and sleep disturbance.   All other systems reviewed and are negative.   Health Maintenance  Topic Date Due   COVID-19 Vaccine (6 - 2024-25 season) 08/06/2023   Medicare Annual Wellness (AWV)  04/09/2024   INFLUENZA VACCINE  07/05/2024   DTaP/Tdap/Td (6 - Td or Tdap) 04/04/2034   Pneumococcal Vaccine: 50+ Years  Completed   Hepatitis B Vaccines  Aged Out   HPV VACCINES  Aged Out   Meningococcal B Vaccine  Aged Out   Colonoscopy  Discontinued   Zoster Vaccines- Shingrix  Discontinued    Physical Exam: Vitals:   07/09/24 0812 07/09/24 0816  BP: (!) 114/54 (!) 100/56  Pulse: 65   Temp: 97.9 F (36.6 C)   SpO2: 97%   Weight: 176 lb 12.8 oz (80.2 kg)   Height: 5' 11 (1.803 m)    Body mass index is 24.66 kg/m. Physical Exam Vitals reviewed.  Constitutional:      Appearance: Normal appearance.  HENT:     Head: Normocephalic.     Nose: Nose normal.     Mouth/Throat:     Mouth: Mucous membranes are moist.     Pharynx: Oropharynx is clear.  Eyes:     Pupils: Pupils are equal, round, and reactive to light.  Cardiovascular:     Rate and Rhythm: Regular rhythm. Bradycardia present.     Pulses: Normal pulses.     Heart sounds: No murmur heard. Pulmonary:     Effort: Pulmonary effort is normal. No respiratory distress.     Breath sounds: Normal breath sounds. No rales.  Abdominal:     General: Abdomen is flat. Bowel sounds are normal.     Palpations: Abdomen is soft.  Musculoskeletal:        General: Swelling present.     Cervical back: Neck supple.  Skin:    General: Skin is warm.  Neurological:     General: No focal deficit present.     Mental Status: He is alert and oriented to person, place, and time.  Psychiatric:        Mood and Affect: Mood normal.         Thought Content: Thought content normal.     Labs reviewed: Basic Metabolic Panel: Recent Labs    07/25/23 0000 09/26/23 0000 02/08/24 0905 05/29/24 1255 06/10/24 0000  NA  --   --   --  140 141  K  --   --   --  4.3 4.3  CL  --   --   --  106 100  CO2  --   --   --  26 27*  GLUCOSE  --   --   --  92  --   BUN  --   --   --  9 12  CREATININE  --   --  0.70 0.62 0.6  CALCIUM   --   --   --  9.2 9.1  TSH 8.70* 5.58  --   --  3.55   Liver Function Tests: Recent Labs    05/29/24 1255 06/10/24 0000  AST 15 31  ALT 9 29  ALKPHOS 79 73  BILITOT 0.6  --   PROT 6.1*  --   ALBUMIN 3.6 3.7   No results for input(s): LIPASE, AMYLASE in the last 8760 hours. No results for input(s): AMMONIA in the last 8760 hours. CBC: Recent Labs    03/05/24  1130 05/29/24 1255 06/10/24 0000  WBC 3.9* 4.4 6.0  NEUTROABS 1.5* 2.3  --   HGB 12.1* 11.4* 12.7*  HCT 38.0* 35.6* 40*  MCV 95.5 94.2  --   PLT 90* 117* 277   Lipid Panel: No results for input(s): CHOL, HDL, LDLCALC, TRIG, CHOLHDL, LDLDIRECT in the last 8760 hours. Lab Results  Component Value Date   HGBA1C 5.2 04/10/2018    Procedures since last visit: No results found.  Assessment/Plan 1. Acute deep vein thrombosis (DVT) of tibial vein of left lower extremity (HCC) (Primary) Eliquis  Will have to stay on Eliquis  Need Blood  Try Compression Stockings for LE edema 2. Hypotension with Bradycardia Patient has been on Coreg  for Cardiomyopathy per Cardiology Has not tolerate ACE inhibitor  But with him feeling dizzy and his blood pressure being low today we will discontinue Coreg  He will come for follow-up in 2 weeks Also will call cardiology to make an appointment  3. Peripheral neuropathy, idiopathic Continues to be his main issues Again discussed not to do long distance driving On gabapentin  and follows with neurology 4. BPH with urinary obstruction Uses In-N-Out cath On Trimpex  5. Mucopurulent  chronic bronchitis (HCC) Continue on Breo  6. Right wrist pain Resolved after a shot course of prednisone  Follows with Dr. Genelle 7.  Thrombocytopenia Follows with hematology We will repeat his labs 8. Hypothyroidism, unspecified type TSH normal     Labs/tests ordered:  CBC,BMP,Lipid next week Next appt:  07/23/2024

## 2024-07-16 DIAGNOSIS — D508 Other iron deficiency anemias: Secondary | ICD-10-CM | POA: Diagnosis not present

## 2024-07-16 DIAGNOSIS — I5022 Chronic systolic (congestive) heart failure: Secondary | ICD-10-CM | POA: Diagnosis not present

## 2024-07-16 DIAGNOSIS — D649 Anemia, unspecified: Secondary | ICD-10-CM | POA: Diagnosis not present

## 2024-07-16 DIAGNOSIS — I10 Essential (primary) hypertension: Secondary | ICD-10-CM | POA: Diagnosis not present

## 2024-07-16 LAB — CBC AND DIFFERENTIAL
HCT: 36 — AB (ref 41–53)
Hemoglobin: 11.6 — AB (ref 13.5–17.5)
WBC: 4.2

## 2024-07-16 LAB — BASIC METABOLIC PANEL WITH GFR
BUN: 6 (ref 4–21)
BUN: 6 (ref 4–21)
CO2: 23 — AB (ref 13–22)
Chloride: 106 (ref 99–108)
Chloride: 106 (ref 99–108)
Creatinine: 0.5 — AB (ref 0.6–1.3)
Creatinine: 0.5 — AB (ref 0.6–1.3)
Glucose: 83
Glucose: 83
Potassium: 3.8 meq/L (ref 3.5–5.1)
Potassium: 3.8 meq/L (ref 3.5–5.1)
Sodium: 141 (ref 137–147)
Sodium: 141 (ref 137–147)

## 2024-07-16 LAB — HEPATIC FUNCTION PANEL
ALT: 10 U/L (ref 10–40)
AST: 16 (ref 14–40)
Alkaline Phosphatase: 91 (ref 25–125)

## 2024-07-16 LAB — LIPID PANEL
Cholesterol: 165 (ref 0–200)
HDL: 63 (ref 35–70)
LDL Cholesterol: 90
Triglycerides: 63 (ref 40–160)

## 2024-07-16 LAB — COMPREHENSIVE METABOLIC PANEL WITH GFR
Albumin: 3.6 (ref 3.5–5.0)
Calcium: 9.1 (ref 8.7–10.7)
Calcium: 9.1 (ref 8.7–10.7)
Globulin: 2

## 2024-07-16 LAB — CBC: RBC: 3.82 — AB (ref 3.87–5.11)

## 2024-07-19 DIAGNOSIS — N312 Flaccid neuropathic bladder, not elsewhere classified: Secondary | ICD-10-CM | POA: Diagnosis not present

## 2024-07-19 DIAGNOSIS — N139 Obstructive and reflux uropathy, unspecified: Secondary | ICD-10-CM | POA: Diagnosis not present

## 2024-07-19 DIAGNOSIS — R3914 Feeling of incomplete bladder emptying: Secondary | ICD-10-CM | POA: Diagnosis not present

## 2024-07-23 ENCOUNTER — Other Ambulatory Visit: Payer: Self-pay

## 2024-07-23 ENCOUNTER — Ambulatory Visit: Admitting: Internal Medicine

## 2024-07-23 ENCOUNTER — Encounter: Payer: Self-pay | Admitting: Internal Medicine

## 2024-07-23 VITALS — BP 128/68 | HR 61 | Temp 97.7°F | Ht 71.0 in | Wt 173.8 lb

## 2024-07-23 DIAGNOSIS — K5901 Slow transit constipation: Secondary | ICD-10-CM | POA: Diagnosis not present

## 2024-07-23 DIAGNOSIS — J411 Mucopurulent chronic bronchitis: Secondary | ICD-10-CM | POA: Diagnosis not present

## 2024-07-23 DIAGNOSIS — K219 Gastro-esophageal reflux disease without esophagitis: Secondary | ICD-10-CM | POA: Diagnosis not present

## 2024-07-23 DIAGNOSIS — D696 Thrombocytopenia, unspecified: Secondary | ICD-10-CM

## 2024-07-23 DIAGNOSIS — E039 Hypothyroidism, unspecified: Secondary | ICD-10-CM

## 2024-07-23 DIAGNOSIS — I82442 Acute embolism and thrombosis of left tibial vein: Secondary | ICD-10-CM

## 2024-07-23 DIAGNOSIS — M25531 Pain in right wrist: Secondary | ICD-10-CM

## 2024-07-23 DIAGNOSIS — I499 Cardiac arrhythmia, unspecified: Secondary | ICD-10-CM

## 2024-07-23 DIAGNOSIS — R2681 Unsteadiness on feet: Secondary | ICD-10-CM | POA: Diagnosis not present

## 2024-07-23 DIAGNOSIS — G609 Hereditary and idiopathic neuropathy, unspecified: Secondary | ICD-10-CM | POA: Diagnosis not present

## 2024-07-23 DIAGNOSIS — N401 Enlarged prostate with lower urinary tract symptoms: Secondary | ICD-10-CM

## 2024-07-23 DIAGNOSIS — N138 Other obstructive and reflux uropathy: Secondary | ICD-10-CM

## 2024-07-23 DIAGNOSIS — I959 Hypotension, unspecified: Secondary | ICD-10-CM | POA: Diagnosis not present

## 2024-07-23 NOTE — Telephone Encounter (Signed)
 Patient is requesting a refill of the following medications: Requested Prescriptions   Pending Prescriptions Disp Refills   diazepam  (VALIUM ) 5 MG tablet 30 tablet 1    Sig: Take 1 tablet (5 mg total) by mouth in the morning and at bedtime. TAKE 1/2 TABLET BY MOUTH 2 (TWO) TIMES DAILY. 1/2 TABLET EVERY 2-3 DAYS    Date of last refill:04/01/24  Refill amount: 30/1  Treatment agreement date: 07/31/2023

## 2024-07-23 NOTE — Progress Notes (Unsigned)
 Location:  [facility]   Place of Service:   Clinic  Provider:   Code Status: DNR Goals of Care:     05/29/2024   10:42 AM  Advanced Directives  Does Patient Have a Medical Advance Directive? No  Would patient like information on creating a medical advance directive? No - Patient declined     Chief Complaint  Patient presents with   Medical Management of Chronic Issues    2 week follow up with labs.    HPI: Patient is a 89 y.o. male seen today for an acute visit for  Past Medical History:  Diagnosis Date   Alcoholic peripheral neuropathy [facility])    neurologist-  dr patel--- feet numbness and sensory ataxia   Arthritis    B12 deficiency    BPH (benign prostatic hyperplasia)    CAD (coronary artery disease) cardiolgoist-  dr lonni end (previouly dr dalton rolan)   a. Myoview 11/13:  EF 38%, inf and IS defect c/w scar but no ischemia:  b. Cardiac CT 11/13:  Ca score 318 Agatson units, pLAD and dCFX plaque;   c. LHC 11/07/12:  pLAD 30%, oD1 40%, oCFX 30%, dCFX 40-50%, mRCA 40%, EF 50% (frequent PVCs and short run of NSVT with injection/    per last echo 01/ 2017  ef 55-60%   Chronic constipation    Diverticulosis of colon    Esophageal reflux    External hemorrhoids    First degree heart block    Hiatal hernia    History of adenomatous polyp of colon    History of esophageal stricture 08/11/2015   s/p  dilatation   History of lower GI bleeding 03/01/2017   s/p  flexiable simoidscopy w/ clipping rectum ulcer   Hypogonadism male    Hypothyroidism    Interstitial cystitis    Irritable bowel syndrome    LBBB (left bundle branch block)    NICM (nonischemic cardiomyopathy) [facility]) cardiologist-  dr lonni end (previously dr dalton rolan)--  per last echo 01/ 2017  ef 55-60%   ? 2/2 LBBB => a. echo 11/13: diff HK, worse in septum and apex, mod LVE, mild LVH, EF 40%, mild AI, mild MR, mod LAE   Pre-diabetes    Self-catheterizes urinary bladder    bid to tid     Wears glasses    Wears hearing aid in both ears     Past Surgical History:  Procedure Laterality Date   CARDIAC CATHETERIZATION  11-07-2012    dr rolan   nonobstructive CAD (pLAD 30%, ostial D1 40%, ostial LCx 30%, dLCx diffuse 40-50%, mRCA 40%) ;  LVSF 50% but diffiult due to PVCs and short run VT with injection;  cardiomyopathy mostly likely a LBBB cardiomyopathy   CARDIOVASCULAR STRESS TEST  10-16-2012   dr rolan   Low risk adenosine  nuclear study (no exercise) w/ a fixed inferior and inferoseptal defect without ischemia (question as whether this abnormality due to LBBB cardiomyopathy or due to scar)/  LV ef 38%,  LV wall motion decreased of the septum and entire apex   CATARACT EXTRACTION W/ INTRAOCULAR LENS  IMPLANT, BILATERAL  2012  approx.   COLONOSCOPY  2016   Dr. Abran, Per new patient form   CYSTO/  HYDRODISTENTION/  BLADDER BIOPSY  04-20-2005    dr renay moats  [facility]   ETHMOIDECTOMY Right 12/12/2016   Procedure: RIGHT ENDOSCOPIC ETHMOIDECTOMY;  Surgeon: Daniel Moccasin, MD;  Location: [facility];  Service: ENT;  Laterality: Right;  FLEXIBLE SIGMOIDOSCOPY N/A 03/02/2017   Procedure: FLEXIBLE SIGMOIDOSCOPY;  Surgeon: Gwendlyn ONEIDA Buddy, MD;  [facility];  Service: Endoscopy;  Laterality: N/A;   LUMBAR LAMINECTOMY  1952   MAXILLARY ANTROSTOMY Right 12/12/2016   Procedure: RIGHT ENDOSCOPIC MAXILLARY ANTROSTOMY;  Surgeon: Daniel Moccasin, MD;  Location: [facility];  Service: ENT;  Laterality: Right;   SINUS ENDO W/FUSION Right 12/12/2016   Procedure: ENDOSCOPIC SINUS SURGERY WITH NAVIGATION;  Surgeon: Daniel Moccasin, MD;  Location: [facility];  Service: ENT;  Laterality: Right;   SPHENOIDECTOMY Right 12/12/2016   Procedure: RIGHT ENDOSCOPIC SPHENOIDECTOMY;  Surgeon: Daniel Moccasin, MD;  Location:  [facility];  Service: ENT;  Laterality: Right;   TONSILLECTOMY AND ADENOIDECTOMY  child   TOTAL KNEE ARTHROPLASTY Left 10/14/2014   Procedure: LEFT  TOTAL KNEE ARTHROPLASTY;  Surgeon: Donnice JONETTA Car, MD;  [facility];  Service: Orthopedics;  Laterality: Left;   TOTAL KNEE ARTHROPLASTY Right 1990s   TRANSTHORACIC ECHOCARDIOGRAM  12-24-2015   dr rolan   ef 55-60%,  grade 1 diastolic dysfunction/  trivial AR and TR  mild MR and PR/  moderate LAE   TRANSURETHRAL RESECTION OF PROSTATE  06-11-2010   dr chales  [facility]   w/  GYRUS   TRANSURETHRAL RESECTION OF PROSTATE N/A 12/06/2017   Procedure: TRANSURETHRAL RESECTION OF THE PROSTATE (TURP)/ BIPOLAR;  Surgeon: Devere Lonni Righter, MD;  Location[facility];  Service: Urology;  Laterality: N/A;   TRANSURETHRAL RESECTION OF PROSTATE N/A 04/11/2018   Procedure: TRANSURETHRAL RESECTION OF THE PROSTATE (TURP);  Surgeon: Devere Lonni Righter, MD;  [facility];  Service: Urology;  Laterality: N/A;   UPPER GASTROINTESTINAL ENDOSCOPY      Allergies  Allergen Reactions   Cyclobenzaprine Other (See Comments)    Lost balance   Lisinopril  Other (See Comments)    Affected ability to urinate   Losartan      Per [facility]   Meloxicam     Per [facility]   Potassium     Per [facility]    Relafen [Nabumetone] Other (See Comments)    Problems urinating afterwards    Statins Other (See Comments)    my peeing    Outpatient Encounter Medications as of 07/23/2024  Medication Sig   apixaban  (ELIQUIS ) 5 MG TABS tablet Take 1 tablet (5 mg total) by mouth 2 (two) times daily.   AZO-CRANBERRY PO Take 1 tablet by mouth daily.   Cyanocobalamin  (VITAMIN B12) 1000 MCG TBCR Take 1,000 mcg by mouth.   diazepam  (VALIUM ) 5 MG tablet TAKE 1/2 TABLET BY MOUTH 2 (TWO) TIMES DAILY. 1/2 TABLET EVERY 2-3 DAYS   fluticasone  furoate-vilanterol (BREO ELLIPTA ) 200-25 MCG/ACT AEPB Inhale 1 puff into the lungs daily. With Spacer   gabapentin  (NEURONTIN ) 300 MG capsule TAKE 1 CAPSULE EVERY DAY   levothyroxine  (SYNTHROID ) 25 MCG tablet  Take 1 tablet (25 mcg total) by mouth daily before breakfast. Tuesday Thursday Friday Saturday   levothyroxine  (SYNTHROID ) 50 MCG tablet TAKE 1 TABLET (50 MCG TOTAL) BY MOUTH DAILY BEFORE BREAKFAST. MONDAY WEDNESDAY SUNDAY   omeprazole  (PRILOSEC) 20 MG capsule TAKE 1 CAPSULE BY MOUTH EVERY DAY   Probiotic Product (PROBIOTIC PO) Take 1 capsule by mouth daily.   trimethoprim  (TRIMPEX ) 100 MG tablet Take 100 mg by mouth 2 (two) times daily.   acetaminophen  (TYLENOL ) 500 MG tablet Take 500 mg by mouth 3 (three) times daily. (Patient taking differently: Take 325 mg  by mouth daily as needed.)   ciprofloxacin  (CIPRO ) 500 MG tablet Take 500 mg by mouth 2 (two) times daily as needed.   polyethylene glycol (MIRALAX  / GLYCOLAX ) packet Take 17 g by mouth daily as needed for mild constipation.   No facility-administered encounter medications on file as of 07/23/2024.    Review of Systems:  Review of Systems  Health Maintenance  Topic Date Due   Medicare Annual Wellness (AWV)  04/09/2024   COVID-19 Vaccine (6 - 2024-25 season) 08/08/2024 (Originally 08/06/2023)   INFLUENZA VACCINE  03/04/2025 (Originally 07/05/2024)   DTaP/Tdap/Td (6 - Td or Tdap) 04/04/2034   Pneumococcal Vaccine: 50+ Years  Completed   HPV VACCINES  Aged Out   Meningococcal B Vaccine  Aged Out   Pneumococcal Vaccine  Discontinued   Colonoscopy  Discontinued   Zoster Vaccines- Shingrix  Discontinued    Physical Exam: Vitals:   07/23/24 0907  BP: 128/68  Pulse: 61  Temp: 97.7 F (36.5 C)  SpO2: 98%  Weight: 173 lb 12.8 oz (78.8 kg)  Height: 5' 11 (1.803 m)   Body mass index is 24.24 kg/m. Physical Exam  Labs reviewed: Basic Metabolic Panel: Recent Labs    07/25/23 0000 09/26/23 0000 02/08/24 0905 05/29/24 1255 06/10/24 0000 07/16/24 0000  NA  --   --   --  140 141 141  141  K  --   --   --  4.3 4.3 3.8  3.8  CL  --   --   --  106 100 106  106  CO2  --   --   --  26 27* 23*  GLUCOSE  --   --   --  92  --    --   BUN  --   --   --  9 12 6  6   CREATININE  --   --    < > 0.62 0.6 0.5*  0.5*  CALCIUM   --   --   --  9.2 9.1 9.1  9.1  TSH 8.70* 5.58  --   --  3.55  --    < > = values in this interval not displayed.   Liver Function Tests: Recent Labs    05/29/24 1255 06/10/24 0000 07/16/24 0000  AST 15 31 16   ALT 9 29 10   ALKPHOS 79 73 91  BILITOT 0.6  --   --   PROT 6.1*  --   --   ALBUMIN 3.6 3.7 3.6   No results for input(s): LIPASE, AMYLASE in the last 8760 hours. No results for input(s): AMMONIA in the last 8760 hours. CBC: Recent Labs    03/05/24 1130 05/29/24 1255 06/10/24 0000 07/16/24 0000  WBC 3.9* 4.4 6.0 4.2  NEUTROABS 1.5* 2.3  --   --   HGB 12.1* 11.4* 12.7* 11.6*  HCT 38.0* 35.6* 40* 36*  MCV 95.5 94.2  --   --   PLT 90* 117* 277  --    Lipid Panel: Recent Labs    07/16/24 0000  CHOL 165  HDL 63  LDLCALC 90  TRIG 63   Lab Results  Component Value Date   HGBA1C 5.2 04/10/2018    Procedures since last visit: No results found.  Assessment/Plan 1. Acute deep vein thrombosis (DVT) of tibial vein of left lower extremity (HCC) (Primary) Continue Eliquis   2. Thrombocytopenia (HCC) Will need follow up of Platelets  3. Peripheral neuropathy, idiopathic On Gabapentin  Follows with Neurolo  4. BPH with  urinary obstruction ***  5. Mucopurulent chronic bronchitis (HCC) ***  6. Right wrist pain ***  7. Hypotension, unspecified hypotension type ***  8. Hypothyroidism, unspecified type ***  9. Slow transit constipation ***  10. Gait instability ***  11. Gastroesophageal reflux disease without esophagitis ***  EKG shows Sinus Rythum with Ventricular Bigeminy and LBBB  Labs/tests ordered:  CBC Next appt:  08/13/2024

## 2024-07-26 ENCOUNTER — Telehealth: Payer: Self-pay

## 2024-07-26 MED ORDER — DIAZEPAM 5 MG PO TABS: 5.0000 mg | ORAL_TABLET | Freq: Two times a day (BID) | ORAL | 1 refills | Status: DC

## 2024-07-26 NOTE — Telephone Encounter (Signed)
 [facility] has faxed over paper prescription Valium . They stated Interchange is mandated unless the practitioner writes the words, No substitution. Message routed to PCP Charlanne Fredia CROME, MD

## 2024-07-28 ENCOUNTER — Other Ambulatory Visit: Payer: Self-pay | Admitting: Adult Health

## 2024-07-28 MED ORDER — DIAZEPAM 5 MG PO TABS: 5.0000 mg | ORAL_TABLET | Freq: Two times a day (BID) | ORAL | 2 refills | Status: DC | PRN

## 2024-07-30 ENCOUNTER — Other Ambulatory Visit: Payer: Self-pay | Admitting: Adult Health

## 2024-07-30 MED ORDER — DIAZEPAM 5 MG PO TABS: 2.5000 mg | ORAL_TABLET | Freq: Every evening | ORAL | 2 refills | Status: DC | PRN

## 2024-08-08 DIAGNOSIS — E039 Hypothyroidism, unspecified: Secondary | ICD-10-CM | POA: Diagnosis not present

## 2024-08-08 DIAGNOSIS — E559 Vitamin D deficiency, unspecified: Secondary | ICD-10-CM | POA: Diagnosis not present

## 2024-08-09 DIAGNOSIS — N312 Flaccid neuropathic bladder, not elsewhere classified: Secondary | ICD-10-CM | POA: Diagnosis not present

## 2024-08-09 DIAGNOSIS — N3020 Other chronic cystitis without hematuria: Secondary | ICD-10-CM | POA: Diagnosis not present

## 2024-08-12 DIAGNOSIS — H52203 Unspecified astigmatism, bilateral: Secondary | ICD-10-CM | POA: Diagnosis not present

## 2024-08-12 DIAGNOSIS — Z961 Presence of intraocular lens: Secondary | ICD-10-CM | POA: Diagnosis not present

## 2024-08-12 DIAGNOSIS — Z85828 Personal history of other malignant neoplasm of skin: Secondary | ICD-10-CM | POA: Diagnosis not present

## 2024-08-12 DIAGNOSIS — L570 Actinic keratosis: Secondary | ICD-10-CM | POA: Diagnosis not present

## 2024-08-13 ENCOUNTER — Encounter: Admitting: Internal Medicine

## 2024-08-23 ENCOUNTER — Ambulatory Visit: Payer: Self-pay

## 2024-08-23 ENCOUNTER — Telehealth: Payer: Self-pay

## 2024-08-23 MED ORDER — PREDNISONE 20 MG PO TABS: 20.0000 mg | ORAL_TABLET | Freq: Every day | ORAL | 0 refills | Status: AC

## 2024-08-23 NOTE — Telephone Encounter (Signed)
 Danielle with [employer] and agreed.

## 2024-08-23 NOTE — Telephone Encounter (Signed)
 Copied from CRM #[id number]. Topic: Clinical - Medical Advice >> Aug 23, 2024  9:24 AM Marda MATSU wrote: Please call Danielle with [facility].  She is questioning if patient David Gutierrez can have prednisone  rx or does he need an appt  until his injection that is coming up for his wrist   Please advise.

## 2024-08-23 NOTE — Telephone Encounter (Signed)
 FYI Only or Action Required?: Action required by provider: medication refill request. Requesting short course of steroids to help with pain and swelling until  injection into wrist next week.  Patient was last seen in primary care on 07/23/2024 by Charlanne Fredia CROME, MD.  Called Nurse Triage reporting Joint Swelling.  Symptoms began ongoing.  Interventions attempted: Nothing.  Symptoms are: gradually worsening.  Triage Disposition: See PCP When Office is Open (Within 3 Days)  Patient/caregiver understands and will follow disposition?: Yes                Copied from CRM [id number]. Topic: Clinical - Red Word Triage >> Aug 23, 2024  9:34 AM Merlynn LABOR wrote: Red Word that prompted transfer to Nurse Triage: Arthritis in right wrist, unable to use wrist right now Reason for Disposition  [1] MODERATE pain (e.g., interferes with normal activities) AND [2] present > 3 days  Answer Assessment - Initial Assessment Questions Patient has had wrist problems d/t arthritis for awhile. Today pt is having trouble moving his wrist. He has an appt next week for an injection into wrist for pain and swelling. S/w Danielle who states that in the past pt has been given a short course of steroids to ease pain.  She is requesting steroids be called into [facility]  [address]  1. ONSET: When did the pain start?     Ongoing 2. LOCATION: Where is the pain located?     Right wrist 3. PAIN: How bad is the pain? (Scale 1-10; or mild, moderate, severe)     Cannot mover wrist  5. CAUSE: What do you think is causing the pain?     Arthritis  Protocols used: Wrist Pain-A-AH

## 2024-08-23 NOTE — Telephone Encounter (Signed)
 Message routed to PCP Charlanne Fredia CROME, MD

## 2024-08-23 NOTE — Telephone Encounter (Signed)
 I have ordered 20 mg Prednisone  for 7 days

## 2024-08-27 ENCOUNTER — Ambulatory Visit (INDEPENDENT_AMBULATORY_CARE_PROVIDER_SITE_OTHER): Admitting: Student

## 2024-08-27 DIAGNOSIS — M25431 Effusion, right wrist: Secondary | ICD-10-CM

## 2024-08-27 DIAGNOSIS — M25531 Pain in right wrist: Secondary | ICD-10-CM | POA: Diagnosis not present

## 2024-08-27 MED ORDER — LIDOCAINE HCL 1 % IJ SOLN
2.0000 mL | INTRAMUSCULAR | Status: AC | PRN
  Administered 2024-08-27: 2 mL

## 2024-08-27 NOTE — Progress Notes (Signed)
 Chief Complaint: Right wrist pain     History of Present Illness:    David Gutierrez is a 89 y.o. male who presents to clinic today for follow-up of right wrist pain and for possible cortisone injection.  Patient has received 2 previous injections into the left wrist from Dr. Genelle which has given him significant relief and he still reports no symptoms within the left wrist.  His right wrist has been giving him issues over the past few months.  X-rays taken in June of this year showed diffuse chondrocalcinosis particularly within the carpals.  Patient has been taking 20 mg of oral prednisone  daily for about the last 4 days for his wrist pain until he could come in for evaluation.  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  His swelling and redness of the wrist has decreased since he was last seen.   Surgical History:   None of right upper extremity  PMH/PSH/Family History/Social History/Meds/Allergies:    Past Medical History:  Diagnosis Date   Alcoholic peripheral neuropathy    neurologist-  dr patel--- feet numbness and sensory ataxia   Arthritis    B12 deficiency    BPH (benign prostatic hyperplasia)    CAD (coronary artery disease) cardiolgoist-  dr lonni end (previouly dr dalton rolan)   a. Myoview 11/13:  EF 38%, inf and IS defect c/w scar but no ischemia:  b. Cardiac CT 11/13:  Ca score 318 Agatson units, pLAD and dCFX plaque;   c. LHC 11/07/12:  pLAD 30%, oD1 40%, oCFX 30%, dCFX 40-50%, mRCA 40%, EF 50% (frequent PVCs and short run of NSVT with injection/    per last echo 01/ 2017  ef 55-60%   Chronic constipation    Diverticulosis of colon    Esophageal reflux    External hemorrhoids    First degree heart block    Hiatal hernia    History of adenomatous polyp of colon    History of esophageal stricture 08/11/2015   s/p  dilatation   History of lower GI bleeding 03/01/2017   s/p  flexiable simoidscopy w/ clipping rectum ulcer    Hypogonadism male    Hypothyroidism    Interstitial cystitis    Irritable bowel syndrome    LBBB (left bundle branch block)    NICM (nonischemic cardiomyopathy) [facility]) cardiologist-  dr lonni end (previously dr dalton rolan)--  per last echo 01/ 2017  ef 55-60%   ? 2/2 LBBB => a. echo 11/13: diff HK, worse in septum and apex, mod LVE, mild LVH, EF 40%, mild AI, mild MR, mod LAE   Pre-diabetes    Self-catheterizes urinary bladder    bid to tid    Wears glasses    Wears hearing aid in both ears    Past Surgical History:  Procedure Laterality Date   CARDIAC CATHETERIZATION  11-07-2012    dr rolan   nonobstructive CAD (pLAD 30%, ostial D1 40%, ostial LCx 30%, dLCx diffuse 40-50%, mRCA 40%) ;  LVSF 50% but diffiult due to PVCs and short run VT with injection;  cardiomyopathy mostly likely a LBBB cardiomyopathy   CARDIOVASCULAR STRESS TEST  10-16-2012   dr rolan   Low risk adenosine  nuclear study (no exercise) w/ a fixed inferior and inferoseptal defect without ischemia (question as whether this abnormality  due to LBBB cardiomyopathy or due to scar)/  LV ef 38%,  LV wall motion decreased of the septum and entire apex   CATARACT EXTRACTION W/ INTRAOCULAR LENS  IMPLANT, BILATERAL  2012  approx.   COLONOSCOPY  2016   Dr. Abran, Per new patient form   CYSTO/  HYDRODISTENTION/  BLADDER BIOPSY  04-20-2005    dr renay moats  [facility]   ETHMOIDECTOMY Right 12/12/2016   Procedure: RIGHT ENDOSCOPIC ETHMOIDECTOMY;  Surgeon: Daniel Moccasin, MD;  Location: [facility];  Service: ENT;  Laterality: Right;   FLEXIBLE SIGMOIDOSCOPY N/A 03/02/2017   Procedure: FLEXIBLE SIGMOIDOSCOPY;  Surgeon: Gwendlyn ONEIDA Buddy, MD;  Location: [facility];  Service: Endoscopy;  Laterality: N/A;   LUMBAR LAMINECTOMY  1952   MAXILLARY ANTROSTOMY Right 12/12/2016   Procedure: RIGHT ENDOSCOPIC MAXILLARY ANTROSTOMY;  Surgeon: Daniel Moccasin, MD;  Location: [facility];  Service: ENT;  Laterality: Right;   SINUS ENDO  W/FUSION Right 12/12/2016   Procedure: ENDOSCOPIC SINUS SURGERY WITH NAVIGATION;  Surgeon: Daniel Moccasin, MD;  Location: [facility];  Service: ENT;  Laterality: Right;   SPHENOIDECTOMY Right 12/12/2016   Procedure: RIGHT ENDOSCOPIC SPHENOIDECTOMY;  Surgeon: Daniel Moccasin, MD;  Location: [facility];  Service: ENT;  Laterality: Right;   TONSILLECTOMY AND ADENOIDECTOMY  child   TOTAL KNEE ARTHROPLASTY Left 10/14/2014   Procedure: LEFT TOTAL KNEE ARTHROPLASTY;  Surgeon: Donnice JONETTA Car, MD;  Location: [facility];  Service: Orthopedics;  Laterality: Left;   TOTAL KNEE ARTHROPLASTY Right 1990s   TRANSTHORACIC ECHOCARDIOGRAM  12-24-2015   dr rolan   ef 55-60%,  grade 1 diastolic dysfunction/  trivial AR and TR  mild MR and PR/  moderate LAE   TRANSURETHRAL RESECTION OF PROSTATE  06-11-2010   dr chales  [facility]   w/  GYRUS   TRANSURETHRAL RESECTION OF PROSTATE N/A 12/06/2017   Procedure: TRANSURETHRAL RESECTION OF THE PROSTATE (TURP)/ BIPOLAR;  Surgeon: Devere Lonni Righter, MD;  Location: [facility];  Service: Urology;  Laterality: N/A;   TRANSURETHRAL RESECTION OF PROSTATE N/A 04/11/2018   Procedure: TRANSURETHRAL RESECTION OF THE PROSTATE (TURP);  Surgeon: Devere Lonni Righter, MD;  Location: [facility];  Service: Urology;  Laterality: N/A;   UPPER GASTROINTESTINAL ENDOSCOPY     Social History   Socioeconomic History   Marital status: Married    Spouse name: Not on file   Number of children: 3   Years of education: Not on file   Highest education level: Not on file  Occupational History   Occupation: retired  Tobacco Use   Smoking status: Former    Current packs/day: 0.00    Average packs/day: 2.0 packs/day for 15.0 years (30.0 ttl pk-yrs)    Types: Cigarettes    Start date: 11/29/1952    Quit date: 11/30/1967    Years since quitting: 56.7   Smokeless tobacco: Never  Vaping Use   Vaping status: Never Used  Substance and Sexual Activity   Alcohol   use: Yes    Alcohol /week: 2.0 standard drinks of alcohol     Types: 2 Standard drinks or equivalent per week    Comment: occasional   Drug use: No   Sexual activity: Yes  Other Topics Concern   Not on file  Social History Narrative   Do you drink/eat things with caffeine? Very Little    Marital status/What year were you married? Married since 1961   Do you live in a house, apartment, assisted living, [place], trailer,  etc.? Did not answer   Is it one or more stories? One   How many persons live in your home? 2    Do you have any pets in your home? No   Current or past profession: Brewing technologist.    Do you exercise? Very little    Type and how often? Did not answer    Do you have a living will? Yes   Do you have a DNR form? Did not answer   Do you have signed POA/HPOA forms? Did not answer   Social Drivers of Corporate investment banker Strain: Not on file  Food Insecurity: Not on file  Transportation Needs: Not on file  Physical Activity: Not on file  Stress: Not on file  Social Connections: Not on file   Family History  Problem Relation Age of Onset   Healthy Mother    Healthy Father    Other Sister    Healthy Sister    Thyroid  cancer Brother    Heart disease Brother    Diabetes type I Son    Colon cancer Neg Hx    Esophageal cancer Neg Hx    Stomach cancer Neg Hx    Rectal cancer Neg Hx    Allergies  Allergen Reactions   Cyclobenzaprine Other (See Comments)    Lost balance   Lisinopril  Other (See Comments)    Affected ability to urinate   Losartan      Per [facility]   Meloxicam     Per [facility]   Potassium     Per [facility]    Relafen [Nabumetone] Other (See Comments)    Problems urinating afterwards    Statins Other (See Comments)    my peeing   Current Outpatient Medications  Medication Sig Dispense Refill   acetaminophen  (TYLENOL ) 500 MG tablet Take 500 mg by mouth 3 (three) times daily. (Patient  taking differently: Take 325 mg by mouth daily as needed.)     apixaban  (ELIQUIS ) 5 MG TABS tablet Take 1 tablet (5 mg total) by mouth 2 (two) times daily. 60 tablet 3   AZO-CRANBERRY PO Take 1 tablet by mouth daily.     ciprofloxacin  (CIPRO ) 500 MG tablet Take 500 mg by mouth 2 (two) times daily as needed.     Cyanocobalamin  (VITAMIN B12) 1000 MCG TBCR Take 1,000 mcg by mouth.     diazepam  (VALIUM ) 5 MG tablet Take 0.5 tablets (2.5 mg total) by mouth at bedtime as needed for anxiety. 15 tablet 2   fluticasone  furoate-vilanterol (BREO ELLIPTA ) 200-25 MCG/ACT AEPB Inhale 1 puff into the lungs daily. With Spacer 1 each 11   gabapentin  (NEURONTIN ) 300 MG capsule TAKE 1 CAPSULE EVERY DAY 90 capsule 3   levothyroxine  (SYNTHROID ) 25 MCG tablet Take 1 tablet (25 mcg total) by mouth daily before breakfast. Tuesday Thursday Friday Saturday 90 tablet 6   levothyroxine  (SYNTHROID ) 50 MCG tablet TAKE 1 TABLET (50 MCG TOTAL) BY MOUTH DAILY BEFORE BREAKFAST. MONDAY WEDNESDAY SUNDAY 30 tablet 3   omeprazole  (PRILOSEC) 20 MG capsule TAKE 1 CAPSULE BY MOUTH EVERY DAY 90 capsule 3   polyethylene glycol (MIRALAX  / GLYCOLAX ) packet Take 17 g by mouth daily as needed for mild constipation.     predniSONE  (DELTASONE ) 20 MG tablet Take 1 tablet (20 mg total) by mouth daily with breakfast for 7 days. 7 tablet 0   Probiotic Product (PROBIOTIC PO) Take 1 capsule by mouth daily.  trimethoprim  (TRIMPEX ) 100 MG tablet Take 100 mg by mouth 2 (two) times daily.     No current facility-administered medications for this visit.   No results found.  Review of Systems:   A ROS was performed including pertinent positives and negatives as documented in the HPI.  Physical Exam :   Constitutional: NAD and appears stated age Neurological: Alert and oriented Psych: Appropriate affect and cooperative There were no vitals taken for this visit.   Comprehensive Musculoskeletal Exam:    Right wrist demonstrates tenderness over  the dorsal carpals and distal radius.  Mild discomfort noted in active wrist flexion and extension.  No significant swelling, erythema, or warmth.  Radial pulse 2+.  Distal neurosensory exam intact.  Imaging:     Assessment:   89 y.o. male with chronic pain of the right wrist.  Previous x-rays have demonstrated diffuse degenerative changes as well as radiocarpal chondrocalcinosis.  He has done well with injections into the left wrist previously and would like this performed of the right wrist today.  I do feel like this is reasonable to get him some longer lasting relief.  Injection of 2 mL lidocaine  and 2 mL betamethasone was performed into the right radiocarpal joint without complication and he tolerated this well.  Recommend that he discontinue current oral prednisone  due to injection today.  Will plan to have him follow-up as needed.  Plan :    - Right wrist radiocarpal injection performed today - Follow-up as needed    Procedure Note  Patient: David Gutierrez             Date of Birth: Oct 30, 1933           MRN: [id number]             Visit Date: 08/27/2024  Procedures: Visit Diagnoses:  1. Pain and swelling of right wrist     Medium Joint Inj: R radiocarpal on 08/27/2024 5:08 PM Indications: pain Details: 25 G 1.5 in needle, dorsal approach Medications: 2 mL lidocaine  1 % (2 mL betamethasone) Outcome: tolerated well, no immediate complications Procedure, treatment alternatives, risks and benefits explained, specific risks discussed. Consent was given by the patient. Immediately prior to procedure a time out was called to verify the correct patient, procedure, equipment, support staff and site/side marked as required. Patient was prepped and draped in the usual sterile fashion.       I personally saw and evaluated the patient, and participated in the management and treatment plan.  Leonce Reveal, PA-C Orthopedics

## 2024-08-28 ENCOUNTER — Other Ambulatory Visit: Payer: Self-pay | Admitting: Neurology

## 2024-09-03 ENCOUNTER — Ambulatory Visit (INDEPENDENT_AMBULATORY_CARE_PROVIDER_SITE_OTHER): Payer: PPO | Admitting: Neurology

## 2024-09-03 ENCOUNTER — Encounter: Payer: Self-pay | Admitting: Neurology

## 2024-09-03 VITALS — BP 127/54 | HR 62 | Ht 71.0 in | Wt 175.0 lb

## 2024-09-03 DIAGNOSIS — G621 Alcoholic polyneuropathy: Secondary | ICD-10-CM

## 2024-09-03 MED ORDER — GABAPENTIN 300 MG PO CAPS: ORAL_CAPSULE | ORAL | 3 refills | Status: AC

## 2024-09-03 NOTE — Progress Notes (Signed)
 Follow-up Visit   Date: 09/03/24    David Gutierrez MRN: [id number] DOB: 02/15/1933   Interim History: David Gutierrez is a 89 y.o. right-handed Caucasian male with hypertension, CAD, GERD, hyperlipidemia, interstitial cystitis, s/p lumbar surgery (1952) and hypothyroidism returning to the clinic for follow-up of neuropathy, alcohol -induced.  The patient was accompanied to the clinic by self.   IMPRESSION: Peripheral neuropathy, alcohol -induced, mild progression.  Symptoms manifesting with numbness, tingling, and ataxia  - Continue gabapentin  300mg  at bedtime  - Patient educated on daily foot inspection, fall prevention, and safety precautions around the home.  - Encouraged to use a walker even when out of the home, such as doctors visit.  Offered virtual visit for future appointments, if needed.   Return to clinic in 1 year  --------------------------------------------------------  History of present illness: A few years ago, he began noticing imbalance when climbing hills while playing golf.  He gave up playing golf late 2016 because of difficulty with balance.  He walk independently, but when walking his dog he takes a hiking stick which helps. He stays very active and had a trainer as well as classes that he attends for balance.   Several years ago, he also noticed that he has numbness involving the feet. He drinks about 4-5oz vodka and occasionally wine for the past 20 years.  No history of diabetes. He was diagnosed with alcohol -induced neuropathy.  He quit drinking alcohol  for 1 year and noticed mild improvement in his feet paresthesias, but not enough for him to want to completely abstain from alcohol .  He has been using a foot massager with electrical stimulator, which provides some relief.  UPDATE 09/03/2024:  He is here for 1 year follow-up visit.  There has been no significant change to his neuropathy.  He continues to have numbness in the feet.  Tingling is better on  gabapentin  300mg  at bedtime.  Balance remains poor, he is very careful and uses a rollator predominately.  He also has a power chair.  He came with cane today because the rollator is too heavy to manage in his vehicle.  He has cut back his alcohol  to 2oz alcohol  daily.    Medications:  Current Outpatient Medications on File Prior to Visit  Medication Sig Dispense Refill   apixaban  (ELIQUIS ) 5 MG TABS tablet Take 1 tablet (5 mg total) by mouth 2 (two) times daily. 60 tablet 3   AZO-CRANBERRY PO Take 1 tablet by mouth daily.     ciprofloxacin  (CIPRO ) 500 MG tablet Take 500 mg by mouth 2 (two) times daily as needed.     Cyanocobalamin  (VITAMIN B12) 1000 MCG TBCR Take 1,000 mcg by mouth.     diazepam  (VALIUM ) 5 MG tablet Take 0.5 tablets (2.5 mg total) by mouth at bedtime as needed for anxiety. 15 tablet 2   fluticasone  furoate-vilanterol (BREO ELLIPTA ) 200-25 MCG/ACT AEPB Inhale 1 puff into the lungs daily. With Spacer 1 each 11   levothyroxine  (SYNTHROID ) 25 MCG tablet Take 1 tablet (25 mcg total) by mouth daily before breakfast. Tuesday Thursday Friday Saturday 90 tablet 6   levothyroxine  (SYNTHROID ) 50 MCG tablet TAKE 1 TABLET (50 MCG TOTAL) BY MOUTH DAILY BEFORE BREAKFAST. MONDAY WEDNESDAY SUNDAY 30 tablet 3   omeprazole  (PRILOSEC) 20 MG capsule TAKE 1 CAPSULE BY MOUTH EVERY DAY 90 capsule 3   polyethylene glycol (MIRALAX  / GLYCOLAX ) packet Take 17 g by mouth daily as needed for mild constipation.     Probiotic Product (PROBIOTIC  PO) Take 1 capsule by mouth daily.     trimethoprim  (TRIMPEX ) 100 MG tablet Take 100 mg by mouth 2 (two) times daily.     acetaminophen  (TYLENOL ) 500 MG tablet Take 500 mg by mouth 3 (three) times daily. (Patient taking differently: Take 325 mg by mouth daily as needed.)     No current facility-administered medications on file prior to visit.    Allergies:  Allergies  Allergen Reactions   Cyclobenzaprine Other (See Comments)    Lost balance   Lisinopril  Other  (See Comments)    Affected ability to urinate   Losartan      Per [facility]   Meloxicam     Per [facility]   Potassium     Per [facility]    Relafen [Nabumetone] Other (See Comments)    Problems urinating afterwards    Statins Other (See Comments)    my peeing    Vital Signs:  BP (!) 127/54   Pulse 62   Ht 5' 11 (1.803 m)   Wt 175 lb (79.4 kg)   SpO2 98%   BMI 24.41 kg/m   Neurological Exam: MENTAL STATUS including orientation to time, place, person, recent and remote memory, attention span and concentration, language, and fund of knowledge is normal.  Speech is not dysarthric.  CRANIAL NERVES:  Pupils round and reactive to light.  Extraocular muscles intact.  Face is symmetric.  MOTOR:  Motor strength is 5/5 throughout including distally in the feet.  No pronator drift.  Tone is normal.    MSRs:  Right                                                                 Left brachioradialis 2+  brachioradialis 2+  biceps 2+  biceps 2+  triceps 2+  triceps 2+  patellar 1+  patellar 2+  ankle jerk 0  ankle jerk 0   SENSORY:  Vibration and light touch reduced distal to ankles bilaterally.  There is sway with Rhomberg testing.  COORDINATION/GAIT:   Gait wide-based, assisted with cane, and unsteady.  LABS: Labs 03/02/2016:  TSH 3.02, Na 142, K 4.7, Glucose 94, BUN 14, Cr 0.7, LFTs normal Labs 05/06/2016:  Vitamin B12 242, folate 12.2, copper  97, SPEP with IFE no M protein  Lab Results  Component Value Date   VITAMINB12 920 02/21/2023     Thank you for allowing me to participate in patient's care.  If I can answer any additional questions, I would be pleased to do so.    Sincerely,    Aden Sek K. Tobie, DO

## 2024-09-10 DIAGNOSIS — N312 Flaccid neuropathic bladder, not elsewhere classified: Secondary | ICD-10-CM | POA: Diagnosis not present

## 2024-09-10 DIAGNOSIS — R3914 Feeling of incomplete bladder emptying: Secondary | ICD-10-CM | POA: Diagnosis not present

## 2024-09-10 DIAGNOSIS — N139 Obstructive and reflux uropathy, unspecified: Secondary | ICD-10-CM | POA: Diagnosis not present

## 2024-09-18 DIAGNOSIS — N3020 Other chronic cystitis without hematuria: Secondary | ICD-10-CM | POA: Diagnosis not present

## 2024-09-18 DIAGNOSIS — R399 Unspecified symptoms and signs involving the genitourinary system: Secondary | ICD-10-CM | POA: Diagnosis not present

## 2024-10-01 DIAGNOSIS — E785 Hyperlipidemia, unspecified: Secondary | ICD-10-CM | POA: Diagnosis not present

## 2024-10-01 DIAGNOSIS — M1710 Unilateral primary osteoarthritis, unspecified knee: Secondary | ICD-10-CM | POA: Diagnosis not present

## 2024-10-01 LAB — CBC AND DIFFERENTIAL
HCT: 37 — AB (ref 41–53)
Hemoglobin: 12 — AB (ref 13.5–17.5)
WBC: 4.2

## 2024-10-01 LAB — CBC: RBC: 4.08 (ref 3.87–5.11)

## 2024-10-05 DIAGNOSIS — N139 Obstructive and reflux uropathy, unspecified: Secondary | ICD-10-CM | POA: Diagnosis not present

## 2024-10-05 DIAGNOSIS — R3914 Feeling of incomplete bladder emptying: Secondary | ICD-10-CM | POA: Diagnosis not present

## 2024-10-05 DIAGNOSIS — N312 Flaccid neuropathic bladder, not elsewhere classified: Secondary | ICD-10-CM | POA: Diagnosis not present

## 2024-10-06 NOTE — Progress Notes (Unsigned)
 Cardiology Office Note    Date:  02/04/22  ID:  David Gutierrez, DOB Sep 11, 1933, MRN [id number]  PCP:  David Fredia CROME, MD  Cardiologist:  Dr. Okey   History of Present Illness:   David Gutierrez is a 89 y.o. male with history of nonobstructive coronary artery disease, nonischemic cardiomyopathy left bundle branch block, GERD with esophageal stricture/dysphagia (followed by Dr. Abran) and hyperlipidemia. Echocardiogram 10/24/2012 with LV function of 40%, mild mitral regurgitation.Cardiac catheterization in 2013 showed minimal noncompressive CAD. Last echocardiogram January 2017 showed LV function of 55 to 60%, grade 1 diastolic dysfunction and mild mitral regurgitation  I saw the pt  2024     PT denies CP  Brehating is stable   No dizziness   No PND Had a DVT in June 2025  Placed on ELiquis    No predisposing event   Seen by Dr David    HR was low so carvedilol  stopped    Past Medical History:  Diagnosis Date  . Alcoholic peripheral neuropathy    neurologist-  dr patel--- feet numbness and sensory ataxia  . Arthritis   . B12 deficiency   . BPH (benign prostatic hyperplasia)   . CAD (coronary artery disease) cardiolgoist-  dr David Gutierrez (previouly dr David Gutierrez)   a. Myoview 11/13:  EF 38%, inf and IS defect c/w scar but no ischemia:  b. Cardiac CT 11/13:  Ca score 318 Agatson units, pLAD and dCFX plaque;   c. LHC 11/07/12:  pLAD 30%, oD1 40%, oCFX 30%, dCFX 40-50%, mRCA 40%, EF 50% (frequent PVCs and short run of NSVT with injection/    per last echo 01/ 2017  ef 55-60%  . Chronic constipation   . Diverticulosis of colon   . Esophageal reflux   . External hemorrhoids   . First degree heart block   . Hiatal hernia   . History of adenomatous polyp of colon   . History of esophageal stricture 08/11/2015   s/p  dilatation  . History of lower GI bleeding 03/01/2017   s/p  flexiable simoidscopy w/ clipping rectum ulcer  . Hypogonadism male   . Hypothyroidism   .  Interstitial cystitis   . Irritable bowel syndrome   . LBBB (left bundle branch block)   . NICM (nonischemic cardiomyopathy) [facility]) cardiologist-  dr David Gutierrez (previously dr David rolan)--  per last echo 01/ 2017  ef 55-60%   ? 2/2 LBBB => a. echo 11/13: diff HK, worse in septum and apex, mod LVE, mild LVH, EF 40%, mild AI, mild MR, mod LAE  . Pre-diabetes   . Self-catheterizes urinary bladder    bid to tid   . Wears glasses   . Wears hearing aid in both ears     Past Surgical History:  Procedure Laterality Date  . CARDIAC CATHETERIZATION  11-07-2012    dr rolan   nonobstructive CAD (pLAD 30%, ostial D1 40%, ostial LCx 30%, dLCx diffuse 40-50%, mRCA 40%) ;  LVSF 50% but diffiult due to PVCs and short run VT with injection;  cardiomyopathy mostly likely a LBBB cardiomyopathy  . CARDIOVASCULAR STRESS TEST  10-16-2012   dr rolan   Low risk adenosine  nuclear study (no exercise) w/ a fixed inferior and inferoseptal defect without ischemia (question as whether this abnormality due to LBBB cardiomyopathy or due to scar)/  LV ef 38%,  LV wall motion decreased of the septum and entire apex  . CATARACT EXTRACTION W/ INTRAOCULAR LENS  IMPLANT, BILATERAL  2012  approx.  . COLONOSCOPY  2016   Dr. Abran, Per new patient form  . CYSTO/  HYDRODISTENTION/  BLADDER BIOPSY  04-20-2005    dr renay moats  [facility]  . ETHMOIDECTOMY Right 12/12/2016   Procedure: RIGHT ENDOSCOPIC ETHMOIDECTOMY;  Surgeon: David Moccasin, MD;  Location: [facility];  Service: ENT;  Laterality: Right;  . FLEXIBLE SIGMOIDOSCOPY N/A 03/02/2017   Procedure: FLEXIBLE SIGMOIDOSCOPY;  Surgeon: David ONEIDA Buddy, MD;  Location: [facility];  Service: Endoscopy;  Laterality: N/A;  . LUMBAR LAMINECTOMY  1952  . MAXILLARY ANTROSTOMY Right 12/12/2016   Procedure: RIGHT ENDOSCOPIC MAXILLARY ANTROSTOMY;  Surgeon: David Moccasin, MD;  Location: [facility];  Service: ENT;  Laterality: Right;  . SINUS ENDO W/FUSION Right  12/12/2016   Procedure: ENDOSCOPIC SINUS SURGERY WITH NAVIGATION;  Surgeon: David Moccasin, MD;  Location: [facility];  Service: ENT;  Laterality: Right;  . SPHENOIDECTOMY Right 12/12/2016   Procedure: RIGHT ENDOSCOPIC SPHENOIDECTOMY;  Surgeon: David Moccasin, MD;  Location: [facility];  Service: ENT;  Laterality: Right;  . TONSILLECTOMY AND ADENOIDECTOMY  child  . TOTAL KNEE ARTHROPLASTY Left 10/14/2014   Procedure: LEFT TOTAL KNEE ARTHROPLASTY;  Surgeon: David JONETTA Car, MD;  Location: [facility];  Service: Orthopedics;  Laterality: Left;  . TOTAL KNEE ARTHROPLASTY Right 1990s  . TRANSTHORACIC ECHOCARDIOGRAM  12-24-2015   dr rolan   ef 55-60%,  grade 1 diastolic dysfunction/  trivial AR and TR  mild MR and PR/  moderate LAE  . TRANSURETHRAL RESECTION OF PROSTATE  06-11-2010   dr David Gutierrez  [facility]   w/  GYRUS  . TRANSURETHRAL RESECTION OF PROSTATE N/A 12/06/2017   Procedure: TRANSURETHRAL RESECTION OF THE PROSTATE (TURP)/ BIPOLAR;  Surgeon: David David Righter, MD;  Location: [facility];  Service: Urology;  Laterality: N/A;  . TRANSURETHRAL RESECTION OF PROSTATE N/A 04/11/2018   Procedure: TRANSURETHRAL RESECTION OF THE PROSTATE (TURP);  Surgeon: David David Righter, MD;  Location: [facility];  Service: Urology;  Laterality: N/A;  . UPPER GASTROINTESTINAL ENDOSCOPY      Current Medications:  Prior to Admission medications   Medication Sig Start Date Gutierrez Date Taking? Authorizing Provider  aspirin  EC 81 MG tablet Take 81 mg by mouth daily.   Yes [provider]  AZO-CRANBERRY PO Take 2 tablets by mouth 2 (two) times daily.   Yes [provider]  b complex vitamins capsule Take 1 capsule by mouth daily.   Yes [provider]  carvedilol  (COREG ) 6.25 MG tablet TAKE 1 TABLET BY MOUTH TWICE A DAY 10/10/19  Yes David Gutierrez Vina GAILS, MD  [employer] B-1 100 MG tablet Take 100 mg by mouth daily. 04/15/20  Yes [provider]  [employer] STOOL  SOFTENER 100 MG capsule Take 100 mg by mouth as needed for constipation. 04/17/20  Yes [provider]  Cyanocobalamin  (B-12) 1000 MCG LOZG Place 1 tablet under the tongue daily. 03/18/20  Yes [provider]  diazepam  (VALIUM ) 5 MG tablet Take 2.5 mg by mouth 2 (two) times daily.  04/16/14  Yes [provider]  diphenhydramine -acetaminophen  (TYLENOL  PM) 25-500 MG TABS tablet Take 1 tablet by mouth at bedtime.    Yes [provider]  levothyroxine  (LEVOTHROID) 25 MCG tablet Take 25 mcg by mouth every evening.  12/28/15  Yes Rolan Ezra RAMAN, MD  omeprazole  (PRILOSEC) 20 MG capsule Take 1 capsule (20 mg total) by mouth daily. 01/15/20  Yes David Gutierrez Norleen SAILOR, MD  polyethylene glycol (MIRALAX  / GLYCOLAX ) packet Take 17 g by mouth daily as needed for mild constipation.   Yes [provider]  Probiotic Product (PROBIOTIC PO) Take 1 capsule by mouth daily.   Yes [provider]  rosuvastatin  (CRESTOR ) 5 MG tablet TAKE ONE TABLET DAILY 12/13/19  Yes Gutierrez, Lonni, MD  trimethoprim  (TRIMPEX ) 100 MG tablet Take 100 mg by mouth at bedtime. 09/17/18  Yes [provider]     Allergies:   Cyclobenzaprine, Lisinopril , Losartan , Meloxicam, Potassium, Relafen [nabumetone], and Statins   Social History   Socioeconomic History  . Marital status: Married    Spouse name: Not on file  . Number of children: 3  . Years of education: Not on file  . Highest education level: Not on file  Occupational History  . Occupation: retired  Tobacco Use  . Smoking status: Former    Current packs/day: 0.00    Average packs/day: 2.0 packs/day for 15.0 years (30.0 ttl pk-yrs)    Types: Cigarettes    Start date: 11/29/1952    Quit date: 11/30/1967    Years since quitting: 56.8  . Smokeless tobacco: Never  Vaping Use  . Vaping status: Never Used  Substance and Sexual Activity  . Alcohol  use: Yes    Alcohol /week: 2.0 standard drinks of alcohol     Types: 2 Standard  drinks or equivalent per week    Comment: occasional  . Drug use: No  . Sexual activity: Yes  Other Topics Concern  . Not on file  Social History Narrative   Do you drink/eat things with caffeine? Very Little    Marital status/What year were you married? Married since 1961   Do you live in a house, apartment, assisted living, condo, trailer, etc.? Did not answer   Is it one or more stories? One   How many persons live in your home? 2    Do you have any pets in your home? No   Current or past profession: Brewing Technologist.    Do you exercise? Very little    Type and how often? Did not answer    Do you have a living will? Yes   Do you have a DNR form? Did not answer   Do you have signed POA/HPOA forms? Did not answer   Social Drivers of Corporate Investment Banker Strain: Not on file  Food Insecurity: Not on file  Transportation Needs: Not on file  Physical Activity: Not on file  Stress: Not on file  Social Connections: Not on file     Family History:  The patient's family history includes Diabetes type I in his son; Healthy in his father, mother, and sister; Heart disease in his brother; Other in his sister; Thyroid  cancer in his brother.   ROS:   Please see the history of present illness.    ROS All other systems reviewed and are negative.   PHYSICAL EXAM:   VS:  There were no vitals taken for this visit.   GEN: Well nourished, well developed, in no acute distress  HEENT: normal  Neck: no JVD, carotid bruits,  Cardiac: RRR; no murmur  Distant HS   NO  LE edema  Respiratory:  clear to auscultation bilaterally,  GI: soft, nontender, nondistended, + BS MS: no deformity or atrophy  Skin: warm and dry, no rash Neuro:  Alert and Oriented x 3,  Psych: euthymic mood, full affect  Wt Readings from Last 3 Encounters:  09/03/24 175 lb (79.4 kg)  07/23/24 173 lb 12.8 oz (78.8 kg)  07/09/24 176 lb 12.8 oz (80.2 kg)      Studies/Labs Reviewed:    Echo  2017  Left ventricle:  The cavity size was normal. There was mild focal    basal hypertrophy of the septum. Systolic function was normal.    The estimated ejection fraction was in the range of 55% to 60%.    Wall motion was normal; there were no regional wall motion    abnormalities. Doppler parameters are consistent with abnormal    left ventricular relaxation (grade 1 diastolic dysfunction).  - Aortic valve: There was trivial regurgitation.  - Mitral valve: There was mild regurgitation.  - Left atrium: The atrium was moderately dilated.  - Right ventricle: The cavity size was normal. Wall thickness was    normal. Systolic function was normal.   Echo  2014 - Left ventricle: The cavity size was mildly dilated. There    was mild focal basal hypertrophy of the septum. Systolic    function was mildly to moderately reduced. The estimated    ejection fraction was in the range of 40% to 45%. Wall    motion was normal; there were no regional wall motion    abnormalities. Doppler parameters are consistent with    abnormal left ventricular relaxation (grade 1 diastolic    dysfunction).  - Aortic valve: Mild regurgitation.  - Aorta: Ascending aortic diameter: 40mm (S).  - Mitral valve: Trivial regurgitation.  - Left atrium: The atrium was mildly dilated.  - Right ventricle: The cavity size was mildly dilated. Wall    thickness was normal.   CCTA  2013  1. Coronary artery calcium  score of 318 Agatston units.  This  places the patient in the 61st percentile for his age and gender.  This denotes intermediate risk of cardiac events.   2. Prominent plaque in the proximal LAD, possibly up to moderate  stenosis.   3. Prominent plaque in the distal LCx (relatively small vessel).  Suspect at least moderate stenosis.  -------------------------------  EKG:  EKG is ordered today.     SR  66 bpm  First degree AV block  PR 310 msc  LBBB  Occasional PVC Recent Labs: 05/29/2024: Pro Brain Natriuretic Peptide  1,257.0 06/10/2024: Platelets 277; TSH 3.55 07/16/2024: ALT 10; BUN 6; BUN 6; Creatinine 0.5; Creatinine 0.5; Potassium 3.8; Potassium 3.8; Sodium 141; Sodium 141 10/01/2024: Hemoglobin 12.0   Lipid Panel    Component Value Date/Time   CHOL 165 07/16/2024 0000   TRIG 63 07/16/2024 0000   HDL 63 07/16/2024 0000   CHOLHDL 3.2 03/24/2016 0735   VLDL 18 03/24/2016 0735   LDLCALC 90 07/16/2024 0000    Additional studies/ records that were reviewed today include:   Echocardiogram: 12/2015 Study Conclusions   - Left ventricle: The cavity size was normal. There was mild focal    basal hypertrophy of the septum. Systolic function was normal.    The estimated ejection fraction was in the range of 55% to 60%.    Wall motion was normal; there were no regional wall motion    abnormalities. Doppler parameters are consistent with abnormal    left ventricular relaxation (grade 1 diastolic dysfunction).  - Aortic valve: There was trivial regurgitation.  - Mitral valve: There was mild regurgitation.  - Left atrium: The atrium was moderately dilated.  - Right ventricle: The cavity size was normal. Wall thickness was    normal. Systolic function was normal.  Coronary angiography:11/2012 Coronary dominance: right   Left mainstem: No significant disease.    Left anterior descending (LAD): 30% proximal LAD stenosis.  40% ostial D1 stenosis.    Left circumflex (LCx): Moderate ramus with luminal irregularities.  30% ostial LCx.  Diffuse disease up to 40-50% in the distal LCx.    Right coronary artery (RCA): 40% mid RCA stenosis.     Left ventriculography: Left ventricular systolic function is probably mildly decreased, LVEF is estimated at 50% but difficult due to PVCs and short run VT with injection.    Final Conclusions:  Nonobstructive CAD.  I suspect that Mr Kruzel cardiomyopathy is most likely a LBBB cardiomyopathy.  I have started him on Coreg  and lisinopril .  He should continue ASA and  atorvastatin  due to significant, though nonobstructive, CAD.      ASSESSMENT & PLAN:    NICM   Dx in 2013   last echo in 2017 LVEF had normalized  Volume status is good      I am not convinced weakness is primary cardiac   I think probably overall deconditionning    2. Non obstructive CAD by cath in 2013 Remains asympomtatic     Continue ASA  b blocker and statin  Hgb is OK   May consider stopping asa in future   3  HL    Continue Crestor    -Continue statin -Followed by PCP   LDL 63    4.  Alcohol  induced peripheral neuropathy with numbness and gait imbalance -Followed by neurologist -Uses both cane and walker   I encouraged him to try some of equiq at gym at [place] [facility]      Will follow up next winter  -   Medication Adjustments/Labs and Tests Ordered: Current medicines are reviewed at length with the patient today.  Concerns regarding medicines are outlined above.  Medication changes, Labs and Tests ordered today are listed in the Patient Instructions below. There are no Patient Instructions on file for this visit.    Signed, Vina Gull, MD  10/06/2024 5:11 PM    [facility] [address] [place], [place], [place]  [address] Phone: [phone]; Fax: [phone]

## 2024-10-07 ENCOUNTER — Encounter: Payer: Self-pay | Admitting: Radiology

## 2024-10-08 ENCOUNTER — Non-Acute Institutional Stay: Payer: Self-pay | Admitting: Internal Medicine

## 2024-10-08 ENCOUNTER — Encounter: Payer: Self-pay | Admitting: Internal Medicine

## 2024-10-08 VITALS — BP 118/70 | HR 75 | Temp 97.8°F | Ht 71.0 in | Wt 175.0 lb

## 2024-10-08 DIAGNOSIS — J209 Acute bronchitis, unspecified: Secondary | ICD-10-CM

## 2024-10-08 DIAGNOSIS — K5901 Slow transit constipation: Secondary | ICD-10-CM

## 2024-10-08 DIAGNOSIS — N401 Enlarged prostate with lower urinary tract symptoms: Secondary | ICD-10-CM | POA: Diagnosis not present

## 2024-10-08 DIAGNOSIS — R413 Other amnesia: Secondary | ICD-10-CM

## 2024-10-08 DIAGNOSIS — D696 Thrombocytopenia, unspecified: Secondary | ICD-10-CM

## 2024-10-08 DIAGNOSIS — K219 Gastro-esophageal reflux disease without esophagitis: Secondary | ICD-10-CM | POA: Diagnosis not present

## 2024-10-08 DIAGNOSIS — I82442 Acute embolism and thrombosis of left tibial vein: Secondary | ICD-10-CM | POA: Diagnosis not present

## 2024-10-08 DIAGNOSIS — G609 Hereditary and idiopathic neuropathy, unspecified: Secondary | ICD-10-CM

## 2024-10-08 DIAGNOSIS — E039 Hypothyroidism, unspecified: Secondary | ICD-10-CM | POA: Diagnosis not present

## 2024-10-08 DIAGNOSIS — M25531 Pain in right wrist: Secondary | ICD-10-CM | POA: Diagnosis not present

## 2024-10-08 DIAGNOSIS — I959 Hypotension, unspecified: Secondary | ICD-10-CM

## 2024-10-08 DIAGNOSIS — J411 Mucopurulent chronic bronchitis: Secondary | ICD-10-CM | POA: Diagnosis not present

## 2024-10-08 DIAGNOSIS — R1310 Dysphagia, unspecified: Secondary | ICD-10-CM

## 2024-10-08 MED ORDER — PREDNISONE 10 MG PO TABS: 10.0000 mg | ORAL_TABLET | Freq: Every day | ORAL | 0 refills | Status: DC

## 2024-10-08 NOTE — Patient Instructions (Signed)
 Make sure Dr Okey knows that you are off Coreg  Also Get Prevnar 20 from Any pharmacy

## 2024-10-08 NOTE — Progress Notes (Signed)
Open in error  This encounter was created in error - please disregard. 

## 2024-10-08 NOTE — Progress Notes (Signed)
 Location:   [facility]   Place of Service:  Clinic   Provider:   Code Status:  Goals of Care:     09/03/2024    1:14 PM  Advanced Directives  Does Patient Have a Medical Advance Directive? Yes  Type of Estate Agent of [place];Living will     Chief Complaint  Patient presents with   Follow-up    3 month follow up Patient would discuss over all health and arthritis in his right hand.    HPI: Patient is a 89 y.o. male seen today for medical management of chronic diseases.    [place]  Weakness with Low BP and Nola cardia Taken off Coreg  and feels better  Acute DVT  DVT in the posterior tibial vein 05/2024 Started on Eliquis  Now on 5 mg BID Chronic Cough Started on Breo Much better His CT Scan in 3/25 had Shown Signs of COPD and Bronchiectasis  Rectal Incontinence with Constipation  Dysphagia Was seen by GI refused Further work up right now Has h/o Dilatation in 2021    Short Term Memory Changes   Peripheral Neuropathy uses Walker and Power chair  history of nonobstructive CAD, nonischemic cardiomyopathy, left bundle branch block and HLD Last echo showed EF of 55 to 60% with mild MR Follows with Cardiology   History of flaccid neurogenic bladder  Pancreatic Cyst CT imaging Stable Recommended No Follow up needed Thrombocytopenia Sees Hematology  Discussed the use of AI scribe software for clinical note transcription with the patient, who gave verbal consent to proceed.  History of Present Illness   David Gutierrez is an 89 year old male with recurrent urinary tract infections and neuropathy who presents with concerns about antibiotic resistance and worsening neuropathy.  Recurent UTI Sees Dr Devere in Urology He has recurrent urinary tract infections with milky urine and malaise. Multiple antibiotics, including Cipro , have been ineffective due to multidrug-resistant bacteria. He recently completed a seven-day course of  Macrobid, which altered urine color but did not resolve symptoms. He performs self-catheterization four times daily and is not currently on any antibiotics.  Peripheral Neuropathy Follows with Neurology  Neuropathy is worsening, affecting his ability to walk. He uses a walker but struggles with long distances. He continues to drive short distances with good reaction times.  Constipation Seen GI He experiences constipation and uses Benefiber, Senna, and Miralax . Bowel management remains challenging, occasionally requiring laxatives.  Thrombocytopenia Thrombocytopenia is present with platelet counts at 86. He has reduced alcohol  consumption.  Continuous to have Arthritis in his Right Wrist Is due for another injection in Dec Arthritis in his hands, particularly the Right , causes pain and swelling. He uses Voltaren gel and has had steroid injections in the past. Two prednisone  tablets remain from a previous prescription.  Current medications include Eliquis , cranberry supplements, B12, Valium , Breo, gabapentin , Synthroid , and Prilosec.   He occasionally takes Tylenol  for arthritis pain. Carvedilol  and trimethoprim  have been discontinued. No dizziness, weakness, coughing, or swallowing difficulties.      Past Medical History:  Diagnosis Date   Alcoholic peripheral neuropathy    neurologist-  dr patel--- feet numbness and sensory ataxia   Arthritis    B12 deficiency    BPH (benign prostatic hyperplasia)    CAD (coronary artery disease) cardiolgoist-  dr lonni end (previouly dr dalton rolan)   a. Myoview 11/13:  EF 38%, inf and IS defect c/w scar but no ischemia:  b. Cardiac CT  11/13:  Ca score 318 Agatson units, pLAD and dCFX plaque;   c. LHC 11/07/12:  pLAD 30%, oD1 40%, oCFX 30%, dCFX 40-50%, mRCA 40%, EF 50% (frequent PVCs and short run of NSVT with injection/    per last echo 01/ 2017  ef 55-60%   Chronic constipation    Diverticulosis of colon    Esophageal reflux    External  hemorrhoids    First degree heart block    Hiatal hernia    History of adenomatous polyp of colon    History of esophageal stricture 08/11/2015   s/p  dilatation   History of lower GI bleeding 03/01/2017   s/p  flexiable simoidscopy w/ clipping rectum ulcer   Hypogonadism male    Hypothyroidism    Interstitial cystitis    Irritable bowel syndrome    LBBB (left bundle branch block)    NICM (nonischemic cardiomyopathy) [facility]) cardiologist-  dr lonni end (previously dr dalton rolan)--  per last echo 01/ 2017  ef 55-60%   ? 2/2 LBBB => a. echo 11/13: diff HK, worse in septum and apex, mod LVE, mild LVH, EF 40%, mild AI, mild MR, mod LAE   Pre-diabetes    Self-catheterizes urinary bladder    bid to tid    Wears glasses    Wears hearing aid in both ears     Past Surgical History:  Procedure Laterality Date   CARDIAC CATHETERIZATION  11-07-2012    dr rolan   nonobstructive CAD (pLAD 30%, ostial D1 40%, ostial LCx 30%, dLCx diffuse 40-50%, mRCA 40%) ;  LVSF 50% but diffiult due to PVCs and short run VT with injection;  cardiomyopathy mostly likely a LBBB cardiomyopathy   CARDIOVASCULAR STRESS TEST  10-16-2012   dr rolan   Low risk adenosine  nuclear study (no exercise) w/ a fixed inferior and inferoseptal defect without ischemia (question as whether this abnormality due to LBBB cardiomyopathy or due to scar)/  LV ef 38%,  LV wall motion decreased of the septum and entire apex   CATARACT EXTRACTION W/ INTRAOCULAR LENS  IMPLANT, BILATERAL  2012  approx.   COLONOSCOPY  2016   Dr. Abran, Per new patient form   CYSTO/  HYDRODISTENTION/  BLADDER BIOPSY  04-20-2005    dr renay moats  [facility]   ETHMOIDECTOMY Right 12/12/2016   Procedure: RIGHT ENDOSCOPIC ETHMOIDECTOMY;  Surgeon: Daniel Moccasin, MD;  Location: [facility];  Service: ENT;  Laterality: Right;   FLEXIBLE SIGMOIDOSCOPY N/A 03/02/2017   Procedure: FLEXIBLE SIGMOIDOSCOPY;  Surgeon: Gwendlyn ONEIDA Buddy, MD;  Location: [facility];   Service: Endoscopy;  Laterality: N/A;   LUMBAR LAMINECTOMY  1952   MAXILLARY ANTROSTOMY Right 12/12/2016   Procedure: RIGHT ENDOSCOPIC MAXILLARY ANTROSTOMY;  Surgeon: Daniel Moccasin, MD;  Location: [facility];  Service: ENT;  Laterality: Right;   SINUS ENDO W/FUSION Right 12/12/2016   Procedure: ENDOSCOPIC SINUS SURGERY WITH NAVIGATION;  Surgeon: Daniel Moccasin, MD;  Location: [facility];  Service: ENT;  Laterality: Right;   SPHENOIDECTOMY Right 12/12/2016   Procedure: RIGHT ENDOSCOPIC SPHENOIDECTOMY;  Surgeon: Daniel Moccasin, MD;  Location: [facility];  Service: ENT;  Laterality: Right;   TONSILLECTOMY AND ADENOIDECTOMY  child   TOTAL KNEE ARTHROPLASTY Left 10/14/2014   Procedure: LEFT TOTAL KNEE ARTHROPLASTY;  Surgeon: Donnice JONETTA Car, MD;  Location: [facility];  Service: Orthopedics;  Laterality: Left;   TOTAL KNEE ARTHROPLASTY Right 1990s   TRANSTHORACIC ECHOCARDIOGRAM  12-24-2015   dr rolan  ef 55-60%,  grade 1 diastolic dysfunction/  trivial AR and TR  mild MR and PR/  moderate LAE   TRANSURETHRAL RESECTION OF PROSTATE  06-11-2010   dr chales  [facility]   w/  GYRUS   TRANSURETHRAL RESECTION OF PROSTATE N/A 12/06/2017   Procedure: TRANSURETHRAL RESECTION OF THE PROSTATE (TURP)/ BIPOLAR;  Surgeon: Devere Lonni Righter, MD;  Location[facility];  Service: Urology;  Laterality: N/A;   TRANSURETHRAL RESECTION OF PROSTATE N/A 04/11/2018   Procedure: TRANSURETHRAL RESECTION OF THE PROSTATE (TURP);  Surgeon: Devere Lonni Righter, MD;  Location: [facility];  Service: Urology;  Laterality: N/A;   UPPER GASTROINTESTINAL ENDOSCOPY      Allergies  Allergen Reactions   Cyclobenzaprine Other (See Comments)    Lost balance   Lisinopril  Other (See Comments)    Affected ability to urinate   Losartan      Per [facility]   Meloxicam     Per [facility]   Potassium     Per [facility]    Relafen  [Nabumetone] Other (See Comments)    Problems urinating afterwards    Statins Other (See Comments)    my peeing    Outpatient Encounter Medications as of 10/08/2024  Medication Sig   apixaban  (ELIQUIS ) 5 MG TABS tablet Take 1 tablet (5 mg total) by mouth 2 (two) times daily.   AZO-CRANBERRY PO Take 1 tablet by mouth daily.   ciprofloxacin  (CIPRO ) 500 MG tablet Take 500 mg by mouth 2 (two) times daily as needed.   Cyanocobalamin  (VITAMIN B12) 1000 MCG TBCR Take 1,000 mcg by mouth.   diazepam  (VALIUM ) 5 MG tablet Take 0.5 tablets (2.5 mg total) by mouth at bedtime as needed for anxiety.   fluticasone  furoate-vilanterol (BREO ELLIPTA ) 200-25 MCG/ACT AEPB Inhale 1 puff into the lungs daily. With Spacer   gabapentin  (NEURONTIN ) 300 MG capsule TAKE 1 CAPSULE EVERY DAY   levothyroxine  (SYNTHROID ) 25 MCG tablet Take 1 tablet (25 mcg total) by mouth daily before breakfast. Tuesday Thursday Friday Saturday   levothyroxine  (SYNTHROID ) 50 MCG tablet TAKE 1 TABLET (50 MCG TOTAL) BY MOUTH DAILY BEFORE BREAKFAST. MONDAY WEDNESDAY SUNDAY   omeprazole  (PRILOSEC) 20 MG capsule TAKE 1 CAPSULE BY MOUTH EVERY DAY   polyethylene glycol (MIRALAX  / GLYCOLAX ) packet Take 17 g by mouth daily as needed for mild constipation.   predniSONE  (DELTASONE ) 10 MG tablet Take 1 tablet (10 mg total) by mouth daily with breakfast.   Probiotic Product (PROBIOTIC PO) Take 1 capsule by mouth daily.   acetaminophen  (TYLENOL ) 500 MG tablet Take 500 mg by mouth 3 (three) times daily. (Patient not taking: Reported on 10/08/2024)   trimethoprim  (TRIMPEX ) 100 MG tablet Take 100 mg by mouth 2 (two) times daily. (Patient not taking: Reported on 10/08/2024)   No facility-administered encounter medications on file as of 10/08/2024.    Review of Systems:  Review of Systems  Constitutional:  Negative for activity change, appetite change and unexpected weight change.  HENT: Negative.    Respiratory:  Negative for cough and shortness of  breath.   Cardiovascular:  Negative for leg swelling.  Gastrointestinal:  Negative for constipation.  Genitourinary:  Positive for difficulty urinating. Negative for frequency.  Musculoskeletal:  Positive for gait problem and joint swelling. Negative for arthralgias and myalgias.  Skin: Negative.  Negative for rash.  Neurological:  Negative for dizziness and weakness.  Psychiatric/Behavioral:  Negative for confusion and sleep disturbance.   All other  systems reviewed and are negative.   Health Maintenance  Topic Date Due   Medicare Annual Wellness (AWV)  04/09/2024   COVID-19 Vaccine (6 - 2025-26 season) 10/24/2024 (Originally 08/05/2024)   DTaP/Tdap/Td (6 - Td or Tdap) 04/04/2034   Pneumococcal Vaccine: 50+ Years  Completed   Influenza Vaccine  Completed   Meningococcal B Vaccine  Aged Out   Colonoscopy  Discontinued   Zoster Vaccines- Shingrix  Discontinued    Physical Exam: Vitals:   10/08/24 0810  BP: 118/70  Pulse: 75  Temp: 97.8 F (36.6 C)  SpO2: 98%  Weight: 175 lb (79.4 kg)  Height: 5' 11 (1.803 m)   Body mass index is 24.41 kg/m. Physical Exam Vitals reviewed.  Constitutional:      Appearance: Normal appearance.  HENT:     Head: Normocephalic.     Nose: Nose normal.     Mouth/Throat:     Mouth: Mucous membranes are moist.     Pharynx: Oropharynx is clear.  Eyes:     Pupils: Pupils are equal, round, and reactive to light.  Cardiovascular:     Rate and Rhythm: Normal rate and regular rhythm.     Pulses: Normal pulses.     Heart sounds: No murmur heard. Pulmonary:     Effort: Pulmonary effort is normal. No respiratory distress.     Breath sounds: Normal breath sounds. No rales.  Abdominal:     General: Abdomen is flat. Bowel sounds are normal.     Palpations: Abdomen is soft.  Musculoskeletal:        General: No swelling.     Cervical back: Neck supple.     Comments: Mild Swelling in Right Wrist   Skin:    General: Skin is warm.  Neurological:      Mental Status: He is alert and oriented to person, place, and time.  Psychiatric:        Mood and Affect: Mood normal.        Thought Content: Thought content normal.     Labs reviewed: Basic Metabolic Panel: Recent Labs    05/29/24 1255 06/10/24 0000 07/16/24 0000  NA 140 141 141  141  K 4.3 4.3 3.8  3.8  CL 106 100 106  106  CO2 26 27* 23*  GLUCOSE 92  --   --   BUN 9 12 6  6   CREATININE 0.62 0.6 0.5*  0.5*  CALCIUM  9.2 9.1 9.1  9.1  TSH  --  3.55  --    Liver Function Tests: Recent Labs    05/29/24 1255 06/10/24 0000 07/16/24 0000  AST 15 31 16   ALT 9 29 10   ALKPHOS 79 73 91  BILITOT 0.6  --   --   PROT 6.1*  --   --   ALBUMIN 3.6 3.7 3.6   No results for input(s): LIPASE, AMYLASE in the last 8760 hours. No results for input(s): AMMONIA in the last 8760 hours. CBC: Recent Labs    03/05/24 1130 03/05/24 1130 05/29/24 1255 06/10/24 0000 07/16/24 0000 10/01/24 1511  WBC 3.9*  --  4.4 6.0 4.2 4.2  NEUTROABS 1.5*  --  2.3  --   --   --   HGB 12.1*   < > 11.4* 12.7* 11.6* 12.0*  HCT 38.0*  --  35.6* 40* 36* 37*  MCV 95.5  --  94.2  --   --   --   PLT 90*  --  117* 277  --   --    < > =  values in this interval not displayed.   Lipid Panel: Recent Labs    07/16/24 0000  CHOL 165  HDL 63  LDLCALC 90  TRIG 63   Lab Results  Component Value Date   HGBA1C 5.2 04/10/2018    Procedures since last visit: No results found.  Assessment/Plan 1. Thrombocytopenia (Primary) Follow with Dr Deanne  2. Peripheral neuropathy, idiopathic Gabapentin  Follows with Neurology It is getting worse He feels more Unstable Uses Power Chair  3. BPH with urinary obstruction and Neuropathic Bladder He has developed MDR Bacteria per Urology Just finished Macrodantin Course Will keep follow up with Dr Devere Has stopped his Trimpex  himself  4. Mucopurulent chronic bronchitis (HCC) Doing well in Breo  5. Right wrist pain Due to Inflammatory  arthritis Cannot use NSAIDS Ordering Prednisone  10 mg to uses for 5 days and keep extra for later till her sees Ortho in Dec  6. Acute deep vein thrombosis (DVT) of tibial vein of left lower extremity (HCC) On Eliquis    7. Hypotension, and Weakness Coreg  Discontinued Feels Better EKG showed  EKG shows Sinus Rythum with Ventricular Bigeminy and LBBB   He will Follow with Cardiology 8. Hypothyroidism, unspecified type Tsh done in 7/25  9. Slow transit constipation Uses Number of Meds PRn  10. Gastroesophageal reflux disease without esophagitis Prilosec  11. Dysphagia, unspecified type   12. Short-term memory loss     Labs/tests ordered:  * No order type specified * Next appt:  10/21/2024

## 2024-10-15 ENCOUNTER — Ambulatory Visit: Attending: Internal Medicine | Admitting: Internal Medicine

## 2024-10-15 ENCOUNTER — Encounter: Payer: Self-pay | Admitting: Internal Medicine

## 2024-10-15 VITALS — BP 126/74 | HR 74 | Ht 71.0 in | Wt 186.4 lb

## 2024-10-15 DIAGNOSIS — E782 Mixed hyperlipidemia: Secondary | ICD-10-CM | POA: Diagnosis not present

## 2024-10-15 NOTE — Patient Instructions (Signed)
 Medication Instructions:  Your physician recommends that you continue on your current medications as directed. Please refer to the Current Medication list given to you today.  *If you need a refill on your cardiac medications before your next appointment, please call your pharmacy*  Follow-Up: At [facility], you and your health needs are our priority.  As part of our continuing mission to provide you with exceptional heart care, our providers are all part of one team.  This team includes your primary Cardiologist (physician) and Advanced Practice Providers or APPs (Physician Assistants and Nurse Practitioners) who all work together to provide you with the care you need, when you need it.  Your next appointment:   9 month(s)  Provider:   Vina Gull, MD   We recommend signing up for the patient portal called MyChart.  Sign up information is provided on this After Visit Summary.  MyChart is used to connect with patients for Virtual Visits (Telemedicine).  Patients are able to view lab/test results, encounter notes, upcoming appointments, etc.  Non-urgent messages can be sent to your provider as well.    To learn more about what you can do with MyChart, go to [email or web address].

## 2024-10-18 ENCOUNTER — Other Ambulatory Visit: Payer: Self-pay | Admitting: Internal Medicine

## 2024-10-18 DIAGNOSIS — E039 Hypothyroidism, unspecified: Secondary | ICD-10-CM

## 2024-10-21 ENCOUNTER — Encounter: Payer: Self-pay | Admitting: Adult Health

## 2024-10-21 ENCOUNTER — Non-Acute Institutional Stay: Admitting: Adult Health

## 2024-10-21 VITALS — BP 118/80 | HR 88 | Temp 98.0°F | Ht 71.0 in | Wt 178.0 lb

## 2024-10-21 DIAGNOSIS — Z0000 Encounter for general adult medical examination without abnormal findings: Secondary | ICD-10-CM | POA: Diagnosis not present

## 2024-10-21 NOTE — Progress Notes (Signed)
 Chief Complaint  Patient presents with   Medicare Wellness    AWV     Subjective:   David Gutierrez is a 89 y.o. male who presents for a Medicare Annual Wellness Visit.  Allergies (verified) Cyclobenzaprine, Lisinopril , Losartan , Meloxicam, Potassium, Relafen [nabumetone], and Statins   History: Past Medical History:  Diagnosis Date   Alcoholic peripheral neuropathy    neurologist-  dr patel--- feet numbness and sensory ataxia   Arthritis    B12 deficiency    BPH (benign prostatic hyperplasia)    CAD (coronary artery disease) cardiolgoist-  dr lonni end (previouly dr dalton rolan)   a. Myoview 11/13:  EF 38%, inf and IS defect c/w scar but no ischemia:  b. Cardiac CT 11/13:  Ca score 318 Agatson units, pLAD and dCFX plaque;   c. LHC 11/07/12:  pLAD 30%, oD1 40%, oCFX 30%, dCFX 40-50%, mRCA 40%, EF 50% (frequent PVCs and short run of NSVT with injection/    per last echo 01/ 2017  ef 55-60%   Chronic constipation    Diverticulosis of colon    Esophageal reflux    External hemorrhoids    First degree heart block    Hiatal hernia    History of adenomatous polyp of colon    History of esophageal stricture 08/11/2015   s/p  dilatation   History of lower GI bleeding 03/01/2017   s/p  flexiable simoidscopy w/ clipping rectum ulcer   Hypogonadism male    Hypothyroidism    Interstitial cystitis    Irritable bowel syndrome    LBBB (left bundle branch block)    NICM (nonischemic cardiomyopathy) [facility]) cardiologist-  dr lonni end (previously dr dalton rolan)--  per last echo 01/ 2017  ef 55-60%   ? 2/2 LBBB => a. echo 11/13: diff HK, worse in septum and apex, mod LVE, mild LVH, EF 40%, mild AI, mild MR, mod LAE   Pre-diabetes    Self-catheterizes urinary bladder    bid to tid    Wears glasses    Wears hearing aid in both ears    Past Surgical History:  Procedure Laterality Date   CARDIAC CATHETERIZATION  11-07-2012    dr rolan   nonobstructive CAD (pLAD 30%, ostial  D1 40%, ostial LCx 30%, dLCx diffuse 40-50%, mRCA 40%) ;  LVSF 50% but diffiult due to PVCs and short run VT with injection;  cardiomyopathy mostly likely a LBBB cardiomyopathy   CARDIOVASCULAR STRESS TEST  10-16-2012   dr rolan   Low risk adenosine  nuclear study (no exercise) w/ a fixed inferior and inferoseptal defect without ischemia (question as whether this abnormality due to LBBB cardiomyopathy or due to scar)/  LV ef 38%,  LV wall motion decreased of the septum and entire apex   CATARACT EXTRACTION W/ INTRAOCULAR LENS  IMPLANT, BILATERAL  2012  approx.   COLONOSCOPY  2016   Dr. Abran, Per new patient form   CYSTO/  HYDRODISTENTION/  BLADDER BIOPSY  04-20-2005    dr renay moats  [facility]   ETHMOIDECTOMY Right 12/12/2016   Procedure: RIGHT ENDOSCOPIC ETHMOIDECTOMY;  Surgeon: Daniel Moccasin, MD;  Location: [facility];  Service: ENT;  Laterality: Right;   FLEXIBLE SIGMOIDOSCOPY N/A 03/02/2017   Procedure: FLEXIBLE SIGMOIDOSCOPY;  Surgeon: Gwendlyn ONEIDA Buddy, MD;  Location: [facility];  Service: Endoscopy;  Laterality: N/A;   LUMBAR LAMINECTOMY  1952   MAXILLARY ANTROSTOMY Right 12/12/2016   Procedure: RIGHT ENDOSCOPIC MAXILLARY ANTROSTOMY;  Surgeon: Daniel Moccasin, MD;  Location: [facility];  Service: ENT;  Laterality: Right;   SINUS ENDO W/FUSION Right 12/12/2016   Procedure: ENDOSCOPIC SINUS SURGERY WITH NAVIGATION;  Surgeon: Daniel Moccasin, MD;  Location: [facility];  Service: ENT;  Laterality: Right;   SPHENOIDECTOMY Right 12/12/2016   Procedure: RIGHT ENDOSCOPIC SPHENOIDECTOMY;  Surgeon: Daniel Moccasin, MD;  Location: [facility];  Service: ENT;  Laterality: Right;   TONSILLECTOMY AND ADENOIDECTOMY  child   TOTAL KNEE ARTHROPLASTY Left 10/14/2014   Procedure: LEFT TOTAL KNEE ARTHROPLASTY;  Surgeon: Donnice JONETTA Car, MD;  Location: [facility];  Service: Orthopedics;  Laterality: Left;   TOTAL KNEE ARTHROPLASTY Right 1990s   TRANSTHORACIC ECHOCARDIOGRAM  12-24-2015   dr  rolan   ef 55-60%,  grade 1 diastolic dysfunction/  trivial AR and TR  mild MR and PR/  moderate LAE   TRANSURETHRAL RESECTION OF PROSTATE  06-11-2010   dr chales  [facility]   w/  GYRUS   TRANSURETHRAL RESECTION OF PROSTATE N/A 12/06/2017   Procedure: TRANSURETHRAL RESECTION OF THE PROSTATE (TURP)/ BIPOLAR;  Surgeon: Devere Lonni Righter, MD;  Location: [facility];  Service: Urology;  Laterality: N/A;   TRANSURETHRAL RESECTION OF PROSTATE N/A 04/11/2018   Procedure: TRANSURETHRAL RESECTION OF THE PROSTATE (TURP);  Surgeon: Devere Lonni Righter, MD;  Location: [facility];  Service: Urology;  Laterality: N/A;   UPPER GASTROINTESTINAL ENDOSCOPY     Family History  Problem Relation Age of Onset   Healthy Mother    Healthy Father    Other Sister    Healthy Sister    Thyroid  cancer Brother    Heart disease Brother    Diabetes type I Son    Colon cancer Neg Hx    Esophageal cancer Neg Hx    Stomach cancer Neg Hx    Rectal cancer Neg Hx    Social History   Occupational History   Occupation: retired  Tobacco Use   Smoking status: Former    Current packs/day: 0.00    Average packs/day: 2.0 packs/day for 15.0 years (30.0 ttl pk-yrs)    Types: Cigarettes    Start date: 11/29/1952    Quit date: 11/30/1967    Years since quitting: 56.9   Smokeless tobacco: Never  Vaping Use   Vaping status: Never Used  Substance and Sexual Activity   Alcohol  use: Yes    Alcohol /week: 2.0 standard drinks of alcohol     Types: 2 Standard drinks or equivalent per week    Comment: occasional   Drug use: No   Sexual activity: Yes   Tobacco Counseling Counseling given: Not Answered  SDOH Screenings   Alcohol  Screen: Low Risk  (04/10/2023)  Depression (PHQ2-9): Low Risk  (10/21/2024)  Tobacco Use: Medium Risk (10/21/2024)   See flowsheets for full screening details  Depression Screen PHQ 2 & 9 Depression Scale- Over the past 2 weeks, how often have you been bothered by any of  the following problems? Little interest or pleasure in doing things: 0 Feeling down, depressed, or hopeless (PHQ Adolescent also includes...irritable): 0 PHQ-2 Total Score: 0     Goals Addressed             This Visit's Progress    Patient Stated       Avoid urinary tract infections Drink adequate water.        Visit info / Clinical Intake: Medicare Wellness Visit Type:: Initial Annual Wellness Visit Persons participating in visit:: patient Medicare Wellness Visit Mode:: In-person (  required for WTM) Information given by:: patient Interpreter Needed?: No Pre-visit prep was completed: no AWV questionnaire completed by patient prior to visit?: no Living arrangements:: lives with spouse/significant other Patient's Overall Health Status Rating: (!) fair Typical amount of pain: some Does pain affect daily life?: (!) yes Are you currently prescribed opioids?: no  Dietary Habits and Nutritional Risks How many meals a day?: 3 Eats fruit and vegetables daily?: (!) no Most meals are obtained by: preparing own meals In the last 2 weeks, have you had any of the following?: none Diabetic:: no  Functional Status Activities of Daily Living (to include ambulation/medication): (!) Needs Assist Dressing/Grooming: Independent Bathing: Independent Toileting: Independent Transfer: Independent with device- listed below Ambulation: Independent with device- listed below Home Assistive Devices/Equipment: Walker (specify Type) (rollator) Medication Administration: Independent Home Management: Independent Manage your own finances?: yes Primary transportation is: driving Concerns about vision?: no *vision screening is required for WTM* Concerns about hearing?: (!) yes Uses hearing aids?: no Hear whispered voice?: yes  Fall Screening Falls in the past year?: 1 Number of falls in past year: 0 Was there an injury with Fall?: 1 Fall Risk Category Calculator: 2 Patient Fall Risk Level:  Moderate Fall Risk  Fall Risk Patient at Risk for Falls Due to: History of fall(s); Impaired balance/gait Fall risk Follow up: Falls evaluation completed  Home and Transportation Safety: All rugs have non-skid backing?: yes Grab bars in the bathtub or shower?: yes Have non-skid surface in bathtub or shower?: (!) no Good home lighting?: yes Regular seat belt use?: yes Hospital stays in the last year:: (!) yes Reason: DVT, fall with skin tear  Cognitive Assessment Difficulty concentrating, remembering, or making decisions? : no Will 6CIT or Mini Cog be Completed: yes What year is it?: 0 points What month is it?: 0 points Give patient an address phrase to remember (5 components): [address] [place] About what time is it?: 0 points Count backwards from 20 to 1: 0 points Say the months of the year in reverse: 0 points Repeat the address phrase from earlier: 0 points 6 CIT Score: 0 points  Advance Directives (For Healthcare) Does Patient Have a Medical Advance Directive?: Yes Does patient want to make changes to medical advance directive?: No - Patient declined Type of Advance Directive: Living will Copy of Healthcare Power of Attorney in Chart?: No - copy requested Copy of Living Will in Chart?: No - copy requested Out of facility DNR (pink MOST or yellow form) in Chart? (Ambulatory ONLY): No - copy requested Would patient like information on creating a medical advance directive?: No - Patient declined  Reviewed/Updated  Reviewed/Updated: Reviewed All (Medical, Surgical, Family, Medications, Allergies, Care Teams, Patient Goals); Medical History; Surgical History; Family History; Medications; Allergies; Patient Goals        Objective:    Today's Vitals   10/21/24 1208  BP: 118/80  Pulse: 88  Temp: 98 F (36.7 C)  SpO2: 98%  Weight: 178 lb (80.7 kg)  Height: 5' 11 (1.803 m)   Body mass index is 24.83 kg/m.  Current Medications (verified) Outpatient  Encounter Medications as of 10/21/2024  Medication Sig   apixaban  (ELIQUIS ) 5 MG TABS tablet Take 1 tablet (5 mg total) by mouth 2 (two) times daily.   AZO-CRANBERRY PO Take 1 tablet by mouth daily.   Cyanocobalamin  (VITAMIN B12) 1000 MCG TBCR Take 1,000 mcg by mouth.   diazepam  (VALIUM ) 5 MG tablet Take 0.5 tablets (2.5 mg total) by mouth  at bedtime as needed for anxiety.   fluticasone  furoate-vilanterol (BREO ELLIPTA ) 200-25 MCG/ACT AEPB Inhale 1 puff into the lungs daily. With Spacer   gabapentin  (NEURONTIN ) 300 MG capsule TAKE 1 CAPSULE EVERY DAY   levothyroxine  (SYNTHROID ) 25 MCG tablet Take 1 tablet (25 mcg total) by mouth daily before breakfast. Tuesday Thursday Friday Saturday   levothyroxine  (SYNTHROID ) 50 MCG tablet TAKE 1 TABLET (50 MCG TOTAL) BY MOUTH DAILY BEFORE BREAKFAST. MONDAY WEDNESDAY SUNDAY   omeprazole  (PRILOSEC) 20 MG capsule TAKE 1 CAPSULE BY MOUTH EVERY DAY   polyethylene glycol (MIRALAX  / GLYCOLAX ) packet Take 17 g by mouth daily as needed for mild constipation.   Probiotic Product (PROBIOTIC PO) Take 1 capsule by mouth daily. (Patient not taking: Reported on 10/21/2024)   No facility-administered encounter medications on file as of 10/21/2024.   Hearing/Vision screen Hearing Screening - Comments:: Wear hearing aids Immunizations and Health Maintenance Health Maintenance  Topic Date Due   COVID-19 Vaccine (6 - 2025-26 season) 10/24/2024 (Originally 08/05/2024)   Medicare Annual Wellness (AWV)  10/21/2025   DTaP/Tdap/Td (6 - Td or Tdap) 04/04/2034   Pneumococcal Vaccine: 50+ Years  Completed   Influenza Vaccine  Completed   Meningococcal B Vaccine  Aged Out   Colonoscopy  Discontinued   Zoster Vaccines- Shingrix  Discontinued        Assessment/Plan:  This is a routine wellness examination for Reiley.  Patient Care Team: Charlanne Fredia CROME, MD as PCP - General (Internal Medicine) Okey Vina GAILS, MD as PCP - Cardiology (Cardiology) Tobie Tonita POUR, DO as  Consulting Physician (Neurology) Tobie Tonita POUR, DO as Consulting Physician (Neurology) Devere Lonni Righter, MD as Consulting Physician (Urology) Joshua Blamer, MD as Consulting Physician (Dermatology)  I have personally reviewed and noted the following in the patient's chart:   Medical and social history Use of alcohol , tobacco or illicit drugs  Current medications and supplements including opioid prescriptions. Functional ability and status Nutritional status Physical activity Advanced directives List of other physicians Hospitalizations, surgeries, and ER visits in previous 12 months Vitals Screenings to include cognitive, depression, and falls Referrals and appointments  No orders of the defined types were placed in this encounter.  In addition, I have reviewed and discussed with patient certain preventive protocols, quality metrics, and best practice recommendations. A written personalized care plan for preventive services as well as general preventive health recommendations were provided to patient.   Tawni America, NP   10/21/2024   Return in 1 year (on 10/21/2025).  After Visit Summary: (In Person-Printed) AVS printed and given to the patient  Nurse Notes: NA

## 2024-10-21 NOTE — Patient Instructions (Addendum)
 David Gutierrez,  Thank you for taking the time for your Medicare Wellness Visit. I appreciate your continued commitment to your health goals. Please review the care plan we discussed, and feel free to reach out if I can assist you further.  Please note that Annual Wellness Visits do not include a physical exam. Some assessments may be limited, especially if the visit was conducted virtually. If needed, we may recommend an in-person follow-up with your provider.  Ongoing Care Seeing your primary care provider every 3 to 6 months helps us  monitor your health and provide consistent, personalized care.    Recommended Screenings:  Health Maintenance  Topic Date Due   COVID-19 Vaccine (6 - 2025-26 season) 10/24/2024*   Medicare Annual Wellness Visit  10/21/2025   DTaP/Tdap/Td vaccine (6 - Td or Tdap) 04/04/2034   Pneumococcal Vaccine for age over 40  Completed   Flu Shot  Completed   Meningitis B Vaccine  Aged Out   Colon Cancer Screening  Discontinued   Zoster (Shingles) Vaccine  Discontinued  *Topic was postponed. The date shown is not the original due date.       10/21/2024    2:56 PM  Advanced Directives  Does Patient Have a Medical Advance Directive? Yes  Type of Advance Directive Living will    Vision: Annual vision screenings are recommended for early detection of glaucoma, cataracts, and diabetic retinopathy. These exams can also reveal signs of chronic conditions such as diabetes and high blood pressure.  Dental: Annual dental screenings help detect early signs of oral cancer, gum disease, and other conditions linked to overall health, including heart disease and diabetes.  Please see the attached documents for additional preventive care recommendations.  David Gutierrez , Thank you for taking time to come for your Medicare Wellness Visit. I appreciate your ongoing commitment to your health goals. Please review the following plan we discussed and let me know if I can assist you in the  future.   Screening recommendations/referrals: Colonoscopy aged out Recommended yearly ophthalmology/optometry visit for glaucoma screening and checkup Recommended yearly dental visit for hygiene and checkup  Vaccinations: Influenza vaccine due annually in September/October Pneumococcal vaccine  recommend Prevnar 20 vaccine Tdap vaccine up to date Shingles vaccine up to date    Advanced directives: reviewed  Conditions/risks identified: fall risk  Next appointment: 1 year  Preventive Care 17 Years and Older, Male Preventive care refers to lifestyle choices and visits with your health care provider that can promote health and wellness. What does preventive care include? A yearly physical exam. This is also called an annual well check. Dental exams once or twice a year. Routine eye exams. Ask your health care provider how often you should have your eyes checked. Personal lifestyle choices, including: Daily care of your teeth and gums. Regular physical activity. Eating a healthy diet. Avoiding tobacco and drug use. Limiting alcohol  use. Practicing safe sex. Taking low doses of aspirin  every day. Taking vitamin and mineral supplements as recommended by your health care provider. What happens during an annual well check? The services and screenings done by your health care provider during your annual well check will depend on your age, overall health, lifestyle risk factors, and family history of disease. Counseling  Your health care provider may ask you questions about your: Alcohol  use. Tobacco use. Drug use. Emotional well-being. Home and relationship well-being. Sexual activity. Eating habits. History of falls. Memory and ability to understand (cognition). Work and work astronomer. Screening  You may  have the following tests or measurements: Height, weight, and BMI. Blood pressure. Lipid and cholesterol levels. These may be checked every 5 years, or more frequently  if you are over 51 years old. Skin check. Lung cancer screening. You may have this screening every year starting at age 18 if you have a 30-pack-year history of smoking and currently smoke or have quit within the past 15 years. Fecal occult blood test (FOBT) of the stool. You may have this test every year starting at age 30. Flexible sigmoidoscopy or colonoscopy. You may have a sigmoidoscopy every 5 years or a colonoscopy every 10 years starting at age 71. Prostate cancer screening. Recommendations will vary depending on your family history and other risks. Hepatitis C blood test. Hepatitis B blood test. Sexually transmitted disease (STD) testing. Diabetes screening. This is done by checking your blood sugar (glucose) after you have not eaten for a while (fasting). You may have this done every 1-3 years. Abdominal aortic aneurysm (AAA) screening. You may need this if you are a current or former smoker. Osteoporosis. You may be screened starting at age 76 if you are at high risk. Talk with your health care provider about your test results, treatment options, and if necessary, the need for more tests. Vaccines  Your health care provider may recommend certain vaccines, such as: Influenza vaccine. This is recommended every year. Tetanus, diphtheria, and acellular pertussis (Tdap, Td) vaccine. You may need a Td booster every 10 years. Zoster vaccine. You may need this after age 74. Pneumococcal 13-valent conjugate (PCV13) vaccine. One dose is recommended after age 93. Pneumococcal polysaccharide (PPSV23) vaccine. One dose is recommended after age 20. Talk to your health care provider about which screenings and vaccines you need and how often you need them. This information is not intended to replace advice given to you by your health care provider. Make sure you discuss any questions you have with your health care provider. Document Released: 12/18/2015 Document Revised: 08/10/2016 Document  Reviewed: 09/22/2015 Elsevier Interactive Patient Education  2017 [employer].  Fall Prevention in the Home Falls can cause injuries. They can happen to people of all ages. There are many things you can do to make your home safe and to help prevent falls. What can I do on the outside of my home? Regularly fix the edges of walkways and driveways and fix any cracks. Remove anything that might make you trip as you walk through a door, such as a raised step or threshold. Trim any bushes or trees on the path to your home. Use bright outdoor lighting. Clear any walking paths of anything that might make someone trip, such as rocks or tools. Regularly check to see if handrails are loose or broken. Make sure that both sides of any steps have handrails. Any raised decks and porches should have guardrails on the edges. Have any leaves, snow, or ice cleared regularly. Use sand or salt on walking paths during winter. Clean up any spills in your garage right away. This includes oil or grease spills. What can I do in the bathroom? Use night lights. Install grab bars by the toilet and in the tub and shower. Do not use towel bars as grab bars. Use non-skid mats or decals in the tub or shower. If you need to sit down in the shower, use a plastic, non-slip stool. Keep the floor dry. Clean up any water that spills on the floor as soon as it happens. Remove soap buildup in the tub  or shower regularly. Attach bath mats securely with double-sided non-slip rug tape. Do not have throw rugs and other things on the floor that can make you trip. What can I do in the bedroom? Use night lights. Make sure that you have a light by your bed that is easy to reach. Do not use any sheets or blankets that are too big for your bed. They should not hang down onto the floor. Have a firm chair that has side arms. You can use this for support while you get dressed. Do not have throw rugs and other things on the floor that can  make you trip. What can I do in the kitchen? Clean up any spills right away. Avoid walking on wet floors. Keep items that you use a lot in easy-to-reach places. If you need to reach something above you, use a strong step stool that has a grab bar. Keep electrical cords out of the way. Do not use floor polish or wax that makes floors slippery. If you must use wax, use non-skid floor wax. Do not have throw rugs and other things on the floor that can make you trip. What can I do with my stairs? Do not leave any items on the stairs. Make sure that there are handrails on both sides of the stairs and use them. Fix handrails that are broken or loose. Make sure that handrails are as long as the stairways. Check any carpeting to make sure that it is firmly attached to the stairs. Fix any carpet that is loose or worn. Avoid having throw rugs at the top or bottom of the stairs. If you do have throw rugs, attach them to the floor with carpet tape. Make sure that you have a light switch at the top of the stairs and the bottom of the stairs. If you do not have them, ask someone to add them for you. What else can I do to help prevent falls? Wear shoes that: Do not have high heels. Have rubber bottoms. Are comfortable and fit you well. Are closed at the toe. Do not wear sandals. If you use a stepladder: Make sure that it is fully opened. Do not climb a closed stepladder. Make sure that both sides of the stepladder are locked into place. Ask someone to hold it for you, if possible. Clearly mark and make sure that you can see: Any grab bars or handrails. First and last steps. Where the edge of each step is. Use tools that help you move around (mobility aids) if they are needed. These include: Canes. Walkers. Scooters. Crutches. Turn on the lights when you go into a dark area. Replace any light bulbs as soon as they burn out. Set up your furniture so you have a clear path. Avoid moving your furniture  around. If any of your floors are uneven, fix them. If there are any pets around you, be aware of where they are. Review your medicines with your doctor. Some medicines can make you feel dizzy. This can increase your chance of falling. Ask your doctor what other things that you can do to help prevent falls. This information is not intended to replace advice given to you by your health care provider. Make sure you discuss any questions you have with your health care provider. Document Released: 09/17/2009 Document Revised: 04/28/2016 Document Reviewed: 12/26/2014 Elsevier Interactive Patient Education  2017 [employer].

## 2024-11-02 ENCOUNTER — Other Ambulatory Visit: Payer: Self-pay | Admitting: Internal Medicine

## 2024-11-04 ENCOUNTER — Inpatient Hospital Stay

## 2024-11-04 ENCOUNTER — Inpatient Hospital Stay: Attending: Oncology | Admitting: Oncology

## 2024-11-04 VITALS — BP 116/60 | HR 60 | Temp 98.0°F | Resp 18 | Ht 71.0 in | Wt 182.7 lb

## 2024-11-04 DIAGNOSIS — D696 Thrombocytopenia, unspecified: Secondary | ICD-10-CM | POA: Diagnosis not present

## 2024-11-04 DIAGNOSIS — D649 Anemia, unspecified: Secondary | ICD-10-CM | POA: Insufficient documentation

## 2024-11-04 DIAGNOSIS — D72819 Decreased white blood cell count, unspecified: Secondary | ICD-10-CM | POA: Insufficient documentation

## 2024-11-04 DIAGNOSIS — Z86718 Personal history of other venous thrombosis and embolism: Secondary | ICD-10-CM | POA: Insufficient documentation

## 2024-11-04 DIAGNOSIS — N312 Flaccid neuropathic bladder, not elsewhere classified: Secondary | ICD-10-CM | POA: Diagnosis not present

## 2024-11-04 DIAGNOSIS — E538 Deficiency of other specified B group vitamins: Secondary | ICD-10-CM | POA: Insufficient documentation

## 2024-11-04 DIAGNOSIS — N139 Obstructive and reflux uropathy, unspecified: Secondary | ICD-10-CM | POA: Diagnosis not present

## 2024-11-04 LAB — CBC WITH DIFFERENTIAL (CANCER CENTER ONLY)
Abs Immature Granulocytes: 0.04 K/uL (ref 0.00–0.07)
Basophils Absolute: 0 K/uL (ref 0.0–0.1)
Basophils Relative: 1 %
Eosinophils Absolute: 0 K/uL (ref 0.0–0.5)
Eosinophils Relative: 1 %
HCT: 41.1 % (ref 39.0–52.0)
Hemoglobin: 13 g/dL (ref 13.0–17.0)
Immature Granulocytes: 1 %
Lymphocytes Relative: 39 %
Lymphs Abs: 1.8 K/uL (ref 0.7–4.0)
MCH: 29.2 pg (ref 26.0–34.0)
MCHC: 31.6 g/dL (ref 30.0–36.0)
MCV: 92.4 fL (ref 80.0–100.0)
Monocytes Absolute: 0.4 K/uL (ref 0.1–1.0)
Monocytes Relative: 8 %
Neutro Abs: 2.3 K/uL (ref 1.7–7.7)
Neutrophils Relative %: 50 %
Platelet Count: 110 K/uL — ABNORMAL LOW (ref 150–400)
RBC: 4.45 MIL/uL (ref 4.22–5.81)
RDW: 19.5 % — ABNORMAL HIGH (ref 11.5–15.5)
WBC Count: 4.6 K/uL (ref 4.0–10.5)
nRBC: 0.4 % — ABNORMAL HIGH (ref 0.0–0.2)

## 2024-11-04 NOTE — Progress Notes (Signed)
  [facility] OFFICE PROGRESS NOTE   Diagnosis: Thrombocytopenia  INTERVAL HISTORY:   Mr. Noah returns as scheduled.  He was diagnosed with a left lower extremity DVT in June and is maintained on apixaban .  No bleeding.  No other complaint.  Objective:  Vital signs in last 24 hours:  Blood pressure 116/60, pulse 60, temperature 98 F (36.7 C), temperature source Temporal, resp. rate 18, height 5' 11 (1.803 m), weight 182 lb 11.2 oz (82.9 kg), SpO2 98%.     Lymphatics: No cervical, supraclavicular, axillary, or inguinal nodes Resp: Lungs clear bilaterally with distant breath sounds Cardio: Distant heart sounds, regular rhythm GI: No hepatosplenomegaly Vascular: Left greater than right lower leg varicosities, hyperpigmentation/purpura at the lower legs bilaterally, no edema   Lab Results:  Lab Results  Component Value Date   WBC 4.6 11/04/2024   HGB 13.0 11/04/2024   HCT 41.1 11/04/2024   MCV 92.4 11/04/2024   PLT 110 (L) 11/04/2024   NEUTROABS 2.3 11/04/2024    CMP  Lab Results  Component Value Date   NA 141 07/16/2024   NA 141 07/16/2024   K 3.8 07/16/2024   K 3.8 07/16/2024   CL 106 07/16/2024   CL 106 07/16/2024   CO2 23 (A) 07/16/2024   GLUCOSE 92 05/29/2024   BUN 6 07/16/2024   BUN 6 07/16/2024   CREATININE 0.5 (A) 07/16/2024   CREATININE 0.5 (A) 07/16/2024   CALCIUM  9.1 07/16/2024   CALCIUM  9.1 07/16/2024   PROT 6.1 (L) 05/29/2024   ALBUMIN 3.6 07/16/2024   AST 16 07/16/2024   ALT 10 07/16/2024   ALKPHOS 91 07/16/2024   BILITOT 0.6 05/29/2024   GFRNONAA >60 05/29/2024   GFRAA >60 04/10/2018     Medications: I have reviewed the patient's current medications.   Assessment/Plan: Mild thrombocytopenia and anemia BPH/neurogenic bladder, self catheterizes Hypothyroid CAD Nonischemic cardiomyopathy Chronic left bundle branch block Peripheral neuropathy Esophageal stricture/dysphagia followed by Dr. 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 Fung has chronic mild thrombocytopenia.  The platelet count is stable.  He was diagnosed with a left lower extremity DVT in June and is maintained on apixaban .  He followed by Dr. Charlanne. He has a history of a faint IgA band on immunofixation in 2023.  A serum immunofixation was negative in August 2024.  We will follow-up on the myeloma panel from today.  There is no clinical evidence of multiple myeloma or another lymphoproliferative disorder.  Mr. Gastineau will call for bleeding.  He will return for an office visit and CBC in 8 months.  Arley Hof, MD  11/04/2024  12:34 PM

## 2024-11-05 ENCOUNTER — Other Ambulatory Visit: Payer: Self-pay | Admitting: Internal Medicine

## 2024-11-05 MED ORDER — DIAZEPAM 5 MG PO TABS: 2.5000 mg | ORAL_TABLET | Freq: Every evening | ORAL | 0 refills | Status: AC | PRN

## 2024-11-06 ENCOUNTER — Ambulatory Visit: Payer: Self-pay | Admitting: Oncology

## 2024-11-06 LAB — MULTIPLE MYELOMA PANEL, SERUM
Albumin SerPl Elph-Mcnc: 3.5 g/dL (ref 2.9–4.4)
Albumin/Glob SerPl: 1.3 (ref 0.7–1.7)
Alpha 1: 0.2 g/dL (ref 0.0–0.4)
Alpha2 Glob SerPl Elph-Mcnc: 0.6 g/dL (ref 0.4–1.0)
B-Globulin SerPl Elph-Mcnc: 1 g/dL (ref 0.7–1.3)
Gamma Glob SerPl Elph-Mcnc: 0.9 g/dL (ref 0.4–1.8)
Globulin, Total: 2.7 g/dL (ref 2.2–3.9)
IgA: 239 mg/dL (ref 61–437)
IgG (Immunoglobin G), Serum: 863 mg/dL (ref 603–1613)
IgM (Immunoglobulin M), Srm: 91 mg/dL (ref 15–143)
M Protein SerPl Elph-Mcnc: 0.2 g/dL — ABNORMAL HIGH
Total Protein ELP: 6.2 g/dL (ref 6.0–8.5)

## 2024-11-07 DIAGNOSIS — N3021 Other chronic cystitis with hematuria: Secondary | ICD-10-CM | POA: Diagnosis not present

## 2024-11-12 ENCOUNTER — Other Ambulatory Visit: Payer: Self-pay

## 2024-11-12 DIAGNOSIS — D696 Thrombocytopenia, unspecified: Secondary | ICD-10-CM

## 2024-12-03 ENCOUNTER — Ambulatory Visit (HOSPITAL_BASED_OUTPATIENT_CLINIC_OR_DEPARTMENT_OTHER): Admitting: Student

## 2024-12-03 ENCOUNTER — Ambulatory Visit (HOSPITAL_BASED_OUTPATIENT_CLINIC_OR_DEPARTMENT_OTHER)

## 2024-12-03 DIAGNOSIS — G8929 Other chronic pain: Secondary | ICD-10-CM | POA: Diagnosis not present

## 2024-12-03 DIAGNOSIS — M19011 Primary osteoarthritis, right shoulder: Secondary | ICD-10-CM

## 2024-12-03 DIAGNOSIS — M25511 Pain in right shoulder: Secondary | ICD-10-CM

## 2024-12-03 NOTE — Progress Notes (Signed)
 "                                Chief Complaint: Right shoulder pain     History of Present Illness:    David Gutierrez is a 89 y.o. male who presents to clinic today for evaluation of right shoulder pain.  He reports history of a scapular fracture which he believes was right sided back when he was playing high school and college football and this was managed nonoperatively.  States that he has been experiencing shoulder discomfort in the last few months and wonders if his prior injury is related.  Pain does cause difficulty with sleeping as he typically lays on his right side.  He has gotten relief with Voltaren gel although is interested in finding out the source of his pain.  He is right-hand dominant and uses a cane in his right hand for ambulation.   Surgical History:   None  PMH/PSH/Family History/Social History/Meds/Allergies:    Past Medical History:  Diagnosis Date   Alcoholic peripheral neuropathy    neurologist-  dr patel--- feet numbness and sensory ataxia   Arthritis    B12 deficiency    BPH (benign prostatic hyperplasia)    CAD (coronary artery disease) cardiolgoist-  dr lonni end (previouly dr dalton rolan)   a. Myoview 11/13:  EF 38%, inf and IS defect c/w scar but no ischemia:  b. Cardiac CT 11/13:  Ca score 318 Agatson units, pLAD and dCFX plaque;   c. LHC 11/07/12:  pLAD 30%, oD1 40%, oCFX 30%, dCFX 40-50%, mRCA 40%, EF 50% (frequent PVCs and short run of NSVT with injection/    per last echo 01/ 2017  ef 55-60%   Chronic constipation    Diverticulosis of colon    Esophageal reflux    External hemorrhoids    First degree heart block    Hiatal hernia    History of adenomatous polyp of colon    History of esophageal stricture 08/11/2015   s/p  dilatation   History of lower GI bleeding 03/01/2017   s/p  flexiable simoidscopy w/ clipping rectum ulcer   Hypogonadism male    Hypothyroidism    Interstitial cystitis    Irritable bowel syndrome    LBBB (left  bundle branch block)    NICM (nonischemic cardiomyopathy) [facility]) cardiologist-  dr lonni end (previously dr dalton rolan)--  per last echo 01/ 2017  ef 55-60%   ? 2/2 LBBB => a. echo 11/13: diff HK, worse in septum and apex, mod LVE, mild LVH, EF 40%, mild AI, mild MR, mod LAE   Pre-diabetes    Self-catheterizes urinary bladder    bid to tid    Wears glasses    Wears hearing aid in both ears    Past Surgical History:  Procedure Laterality Date   CARDIAC CATHETERIZATION  11-07-2012    dr rolan   nonobstructive CAD (pLAD 30%, ostial D1 40%, ostial LCx 30%, dLCx diffuse 40-50%, mRCA 40%) ;  LVSF 50% but diffiult due to PVCs and short run VT with injection;  cardiomyopathy mostly likely a LBBB cardiomyopathy   CARDIOVASCULAR STRESS TEST  10-16-2012   dr rolan   Low risk adenosine  nuclear study (no exercise) w/ a fixed inferior and inferoseptal defect without ischemia (question as whether this abnormality due to LBBB cardiomyopathy or due to scar)/  LV ef 38%,  LV wall motion decreased of the  septum and entire apex   CATARACT EXTRACTION W/ INTRAOCULAR LENS  IMPLANT, BILATERAL  2012  approx.   COLONOSCOPY  2016   Dr. Abran, Per new patient form   CYSTO/  HYDRODISTENTION/  BLADDER BIOPSY  04-20-2005    dr renay moats  [facility]   ETHMOIDECTOMY Right 12/12/2016   Procedure: RIGHT ENDOSCOPIC ETHMOIDECTOMY;  Surgeon: Daniel Moccasin, MD;  Location: [facility];  Service: ENT;  Laterality: Right;   FLEXIBLE SIGMOIDOSCOPY N/A 03/02/2017   Procedure: FLEXIBLE SIGMOIDOSCOPY;  Surgeon: Gwendlyn ONEIDA Buddy, MD;  Location: [facility];  Service: Endoscopy;  Laterality: N/A;   LUMBAR LAMINECTOMY  1952   MAXILLARY ANTROSTOMY Right 12/12/2016   Procedure: RIGHT ENDOSCOPIC MAXILLARY ANTROSTOMY;  Surgeon: Daniel Moccasin, MD;  Location: [facility];  Service: ENT;  Laterality: Right;   SINUS ENDO W/FUSION Right 12/12/2016   Procedure: ENDOSCOPIC SINUS SURGERY WITH NAVIGATION;  Surgeon: Daniel Moccasin, MD;   Location: [facility];  Service: ENT;  Laterality: Right;   SPHENOIDECTOMY Right 12/12/2016   Procedure: RIGHT ENDOSCOPIC SPHENOIDECTOMY;  Surgeon: Daniel Moccasin, MD;  Location: [facility];  Service: ENT;  Laterality: Right;   TONSILLECTOMY AND ADENOIDECTOMY  child   TOTAL KNEE ARTHROPLASTY Left 10/14/2014   Procedure: LEFT TOTAL KNEE ARTHROPLASTY;  Surgeon: Donnice JONETTA Car, MD;  Location: [facility];  Service: Orthopedics;  Laterality: Left;   TOTAL KNEE ARTHROPLASTY Right 1990s   TRANSTHORACIC ECHOCARDIOGRAM  12-24-2015   dr rolan   ef 55-60%,  grade 1 diastolic dysfunction/  trivial AR and TR  mild MR and PR/  moderate LAE   TRANSURETHRAL RESECTION OF PROSTATE  06-11-2010   dr chales  [facility]   w/  GYRUS   TRANSURETHRAL RESECTION OF PROSTATE N/A 12/06/2017   Procedure: TRANSURETHRAL RESECTION OF THE PROSTATE (TURP)/ BIPOLAR;  Surgeon: Devere Lonni Righter, MD;  Location: [facility];  Service: Urology;  Laterality: N/A;   TRANSURETHRAL RESECTION OF PROSTATE N/A 04/11/2018   Procedure: TRANSURETHRAL RESECTION OF THE PROSTATE (TURP);  Surgeon: Devere Lonni Righter, MD;  Location: [facility];  Service: Urology;  Laterality: N/A;   UPPER GASTROINTESTINAL ENDOSCOPY     Social History   Socioeconomic History   Marital status: Married    Spouse name: Not on file   Number of children: 3   Years of education: Not on file   Highest education level: Not on file  Occupational History   Occupation: retired  Tobacco Use   Smoking status: Former    Current packs/day: 0.00    Average packs/day: 2.0 packs/day for 15.0 years (30.0 ttl pk-yrs)    Types: Cigarettes    Start date: 11/29/1952    Quit date: 11/30/1967    Years since quitting: 57.0   Smokeless tobacco: Never  Vaping Use   Vaping status: Never Used  Substance and Sexual Activity   Alcohol  use: Yes    Alcohol /week: 2.0 standard drinks of alcohol     Types: 2 Standard drinks or equivalent per  week    Comment: occasional   Drug use: No   Sexual activity: Yes  Other Topics Concern   Not on file  Social History Narrative   Do you drink/eat things with caffeine? Very Little    Marital status/What year were you married? Married since 1961   Do you live in a house, apartment, assisted living, condo, trailer, etc.? Did not answer   Is it one or more stories? One   How many persons live  in your home? 2    Do you have any pets in your home? No   Current or past profession: Brewing Technologist.    Do you exercise? Very little    Type and how often? Did not answer    Do you have a living will? Yes   Do you have a DNR form? Did not answer   Do you have signed POA/HPOA forms? Did not answer   Social Drivers of Health   Tobacco Use: Medium Risk (10/21/2024)   Patient History    Smoking Tobacco Use: Former    Smokeless Tobacco Use: Never    Passive Exposure: Not on Actuary Strain: Not on file  Food Insecurity: Not on file  Transportation Needs: Not on file  Physical Activity: Not on file  Stress: Not on file  Social Connections: Not on file  Depression (PHQ2-9): Low Risk (11/04/2024)   Depression (PHQ2-9)    PHQ-2 Score: 0  Alcohol  Screen: Low Risk (04/10/2023)   Alcohol  Screen    Last Alcohol  Screening Score (AUDIT): 4  Housing: Not on file  Utilities: Not on file  Health Literacy: Not on file   Family History  Problem Relation Age of Onset   Healthy Mother    Healthy Father    Other Sister    Healthy Sister    Thyroid  cancer Brother    Heart disease Brother    Diabetes type I Son    Colon cancer Neg Hx    Esophageal cancer Neg Hx    Stomach cancer Neg Hx    Rectal cancer Neg Hx    Allergies[1] Current Outpatient Medications  Medication Sig Dispense Refill   apixaban  (ELIQUIS ) 5 MG TABS tablet TAKE 1 TABLET BY MOUTH TWICE A DAY 60 tablet 11   AZO-CRANBERRY PO Take 1 tablet by mouth daily.     Cyanocobalamin  (VITAMIN B12) 1000 MCG TBCR Take 1,000 mcg  by mouth.     diazepam  (VALIUM ) 5 MG tablet Take 0.5 tablets (2.5 mg total) by mouth at bedtime as needed for anxiety. 30 tablet 0   fluticasone  furoate-vilanterol (BREO ELLIPTA ) 200-25 MCG/ACT AEPB Inhale 1 puff into the lungs daily. With Spacer 1 each 11   gabapentin  (NEURONTIN ) 300 MG capsule TAKE 1 CAPSULE EVERY DAY 90 capsule 3   levothyroxine  (SYNTHROID ) 25 MCG tablet Take 1 tablet (25 mcg total) by mouth daily before breakfast. Tuesday Thursday Friday Saturday 90 tablet 6   levothyroxine  (SYNTHROID ) 50 MCG tablet TAKE 1 TABLET (50 MCG TOTAL) BY MOUTH DAILY BEFORE BREAKFAST. MONDAY WEDNESDAY SUNDAY 90 tablet 1   omeprazole  (PRILOSEC) 20 MG capsule TAKE 1 CAPSULE BY MOUTH EVERY DAY 90 capsule 3   polyethylene glycol (MIRALAX  / GLYCOLAX ) packet Take 17 g by mouth daily as needed for mild constipation.     Probiotic Product (PROBIOTIC PO) Take 1 capsule by mouth daily.     No current facility-administered medications for this visit.   No results found.  Review of Systems:   A ROS was performed including pertinent positives and negatives as documented in the HPI.  Physical Exam :   Constitutional: NAD and appears stated age Neurological: Alert and oriented Psych: Appropriate affect and cooperative There were no vitals taken for this visit.   Comprehensive Musculoskeletal Exam:    Exam of the right shoulder demonstrates no significant tenderness upon palpation.  Active range of motion is to 150 degrees forward flexion and 20 degrees of external rotation.  No significant crepitus is  palpable.  Distal neurosensory exam intact.  Imaging:   Xray (right shoulder 3 views): Advanced glenohumeral osteoarthritis with bone-on-bone joint space loss and subchondral cyst formation   I personally reviewed and interpreted the radiographs.   Assessment:   89 y.o. male with chronic right shoulder pain in the setting of advanced glenohumeral osteoarthritis.  Currently the patient reports that his  symptoms are fairly well-controlled with use of topical Voltaren therefore he does not believe this requires any aggressive treatment at this time.  Discussed that he would likely benefit from a cortisone injection in the future if his pain does increase for symptomatic relief.  He will continue to manage symptoms conservatively at home, and understands that he can follow-up anytime should symptoms worsen and we could discuss more treatment options.  Plan :    - Return to clinic as needed     I personally saw and evaluated the patient, and participated in the management and treatment plan.  Leonce Reveal, PA-C Orthopedics     [1]  Allergies Allergen Reactions   Cyclobenzaprine Other (See Comments)    Lost balance   Lisinopril  Other (See Comments)    Affected ability to urinate   Losartan      Per [facility]   Meloxicam     Per [facility]   Potassium     Per [facility]    Relafen [Nabumetone] Other (See Comments)    Problems urinating afterwards    Statins Other (See Comments)    my peeing   "

## 2024-12-26 ENCOUNTER — Other Ambulatory Visit: Payer: Self-pay | Admitting: Internal Medicine

## 2024-12-26 DIAGNOSIS — E039 Hypothyroidism, unspecified: Secondary | ICD-10-CM

## 2025-01-06 ENCOUNTER — Encounter: Admitting: Adult Health

## 2025-01-14 ENCOUNTER — Encounter: Admitting: Internal Medicine

## 2025-03-06 ENCOUNTER — Inpatient Hospital Stay

## 2025-07-07 ENCOUNTER — Inpatient Hospital Stay: Admitting: Oncology

## 2025-07-07 ENCOUNTER — Inpatient Hospital Stay

## 2025-09-08 ENCOUNTER — Ambulatory Visit: Admitting: Neurology
